# Patient Record
Sex: Female | Born: 1945 | Race: White | Hispanic: No | Marital: Married | Smoking: Never smoker
Health system: Southern US, Community
[De-identification: ages and names within clinical notes are randomized; demographics above are authoritative.]

## PROBLEM LIST (undated history)

## (undated) DIAGNOSIS — Z1211 Encounter for screening for malignant neoplasm of colon: Secondary | ICD-10-CM

## (undated) DIAGNOSIS — Z853 Personal history of malignant neoplasm of breast: Secondary | ICD-10-CM

## (undated) DIAGNOSIS — K639 Disease of intestine, unspecified: Secondary | ICD-10-CM

## (undated) DIAGNOSIS — I10 Essential (primary) hypertension: Secondary | ICD-10-CM

## (undated) DIAGNOSIS — F0390 Unspecified dementia without behavioral disturbance: Secondary | ICD-10-CM

## (undated) DIAGNOSIS — C801 Malignant (primary) neoplasm, unspecified: Secondary | ICD-10-CM

## (undated) DIAGNOSIS — I4891 Unspecified atrial fibrillation: Secondary | ICD-10-CM

## (undated) DIAGNOSIS — I509 Heart failure, unspecified: Secondary | ICD-10-CM

## (undated) DIAGNOSIS — N6009 Solitary cyst of unspecified breast: Secondary | ICD-10-CM

## (undated) HISTORY — DX: Disease of intestine, unspecified: K63.9

## (undated) HISTORY — DX: Malignant (primary) neoplasm, unspecified: C80.1

## (undated) HISTORY — DX: Personal history of malignant neoplasm of breast: Z85.3

## (undated) HISTORY — DX: Encounter for screening for malignant neoplasm of colon: Z12.11

## (undated) HISTORY — DX: Essential (primary) hypertension: I10

## (undated) HISTORY — DX: Solitary cyst of unspecified breast: N60.09

---

## 1988-11-04 HISTORY — PX: DILATION AND CURETTAGE OF UTERUS: SHX78

## 2000-03-06 DIAGNOSIS — Z853 Personal history of malignant neoplasm of breast: Secondary | ICD-10-CM

## 2000-03-06 HISTORY — DX: Personal history of malignant neoplasm of breast: Z85.3

## 2000-03-06 HISTORY — PX: CHOLECYSTECTOMY: SHX55

## 2000-03-06 HISTORY — PX: BREAST SURGERY: SHX581

## 2000-03-06 HISTORY — PX: BREAST BIOPSY: SHX20

## 2000-10-04 DIAGNOSIS — C801 Malignant (primary) neoplasm, unspecified: Secondary | ICD-10-CM

## 2000-10-04 HISTORY — DX: Malignant (primary) neoplasm, unspecified: C80.1

## 2004-03-06 DIAGNOSIS — I10 Essential (primary) hypertension: Secondary | ICD-10-CM

## 2004-03-06 HISTORY — DX: Essential (primary) hypertension: I10

## 2004-10-05 ENCOUNTER — Ambulatory Visit: Payer: Self-pay | Admitting: General Surgery

## 2005-10-06 ENCOUNTER — Ambulatory Visit: Payer: Self-pay | Admitting: General Surgery

## 2006-03-06 HISTORY — PX: COLONOSCOPY: SHX174

## 2006-10-10 ENCOUNTER — Ambulatory Visit: Payer: Self-pay | Admitting: General Surgery

## 2007-10-14 ENCOUNTER — Ambulatory Visit: Payer: Self-pay | Admitting: General Surgery

## 2008-10-14 ENCOUNTER — Ambulatory Visit: Payer: Self-pay | Admitting: General Surgery

## 2009-10-27 ENCOUNTER — Ambulatory Visit: Payer: Self-pay | Admitting: Family Medicine

## 2009-11-09 ENCOUNTER — Ambulatory Visit: Payer: Self-pay | Admitting: Family Medicine

## 2010-03-06 DIAGNOSIS — N6009 Solitary cyst of unspecified breast: Secondary | ICD-10-CM

## 2010-03-06 DIAGNOSIS — K639 Disease of intestine, unspecified: Secondary | ICD-10-CM

## 2010-03-06 HISTORY — DX: Solitary cyst of unspecified breast: N60.09

## 2010-03-06 HISTORY — DX: Disease of intestine, unspecified: K63.9

## 2010-11-08 ENCOUNTER — Ambulatory Visit: Payer: Self-pay | Admitting: General Surgery

## 2011-12-05 ENCOUNTER — Ambulatory Visit: Payer: Self-pay | Admitting: General Surgery

## 2012-08-05 ENCOUNTER — Encounter: Payer: Self-pay | Admitting: *Deleted

## 2012-08-05 DIAGNOSIS — Z853 Personal history of malignant neoplasm of breast: Secondary | ICD-10-CM | POA: Insufficient documentation

## 2013-01-08 ENCOUNTER — Ambulatory Visit: Payer: Self-pay | Admitting: General Surgery

## 2013-01-28 ENCOUNTER — Encounter: Payer: Self-pay | Admitting: *Deleted

## 2014-01-05 ENCOUNTER — Encounter: Payer: Self-pay | Admitting: *Deleted

## 2016-02-09 ENCOUNTER — Emergency Department: Payer: Medicare Other

## 2016-02-09 ENCOUNTER — Inpatient Hospital Stay
Admission: EM | Admit: 2016-02-09 | Discharge: 2016-02-11 | DRG: 309 | Disposition: A | Discharge status: Home / Self Care | Payer: Medicare Other | Attending: Internal Medicine | Admitting: Internal Medicine

## 2016-02-09 ENCOUNTER — Encounter: Payer: Self-pay | Admitting: Emergency Medicine

## 2016-02-09 ENCOUNTER — Inpatient Hospital Stay: Payer: Medicare Other

## 2016-02-09 DIAGNOSIS — I10 Essential (primary) hypertension: Secondary | ICD-10-CM | POA: Diagnosis present

## 2016-02-09 DIAGNOSIS — Z23 Encounter for immunization: Secondary | ICD-10-CM | POA: Diagnosis present

## 2016-02-09 DIAGNOSIS — E876 Hypokalemia: Secondary | ICD-10-CM | POA: Diagnosis present

## 2016-02-09 DIAGNOSIS — J9811 Atelectasis: Secondary | ICD-10-CM | POA: Diagnosis present

## 2016-02-09 DIAGNOSIS — I4891 Unspecified atrial fibrillation: Secondary | ICD-10-CM | POA: Diagnosis not present

## 2016-02-09 DIAGNOSIS — R0602 Shortness of breath: Secondary | ICD-10-CM | POA: Diagnosis present

## 2016-02-09 DIAGNOSIS — Z803 Family history of malignant neoplasm of breast: Secondary | ICD-10-CM

## 2016-02-09 DIAGNOSIS — J90 Pleural effusion, not elsewhere classified: Secondary | ICD-10-CM | POA: Diagnosis present

## 2016-02-09 DIAGNOSIS — Z853 Personal history of malignant neoplasm of breast: Secondary | ICD-10-CM

## 2016-02-09 LAB — COMPREHENSIVE METABOLIC PANEL
ALT: 27 U/L (ref 14–54)
AST: 29 U/L (ref 15–41)
Albumin: 4.7 g/dL (ref 3.5–5.0)
Alkaline Phosphatase: 83 U/L (ref 38–126)
Anion gap: 9 (ref 5–15)
BILIRUBIN TOTAL: 0.4 mg/dL (ref 0.3–1.2)
BUN: 11 mg/dL (ref 6–20)
CO2: 27 mmol/L (ref 22–32)
CREATININE: 0.7 mg/dL (ref 0.44–1.00)
Calcium: 9.6 mg/dL (ref 8.9–10.3)
Chloride: 104 mmol/L (ref 101–111)
GFR calc Af Amer: >60 mL/min (ref >60)
GLUCOSE: 121 mg/dL — AB (ref 65–99)
Potassium: 2.9 mmol/L — ABNORMAL LOW (ref 3.5–5.1)
Sodium: 140 mmol/L (ref 135–145)
TOTAL PROTEIN: 7.5 g/dL (ref 6.5–8.1)

## 2016-02-09 LAB — CBC WITH DIFFERENTIAL/PLATELET
BASOS ABS: 0.1 10*3/uL (ref 0–0.1)
Basophils Relative: 1 %
Eosinophils Absolute: 0.1 10*3/uL (ref 0–0.7)
Eosinophils Relative: 1 %
HEMATOCRIT: 42.9 % (ref 35.0–47.0)
HEMOGLOBIN: 14.4 g/dL (ref 12.0–16.0)
LYMPHS PCT: 30 %
Lymphs Abs: 2.4 10*3/uL (ref 1.0–3.6)
MCH: 30.2 pg (ref 26.0–34.0)
MCHC: 33.6 g/dL (ref 32.0–36.0)
MCV: 89.8 fL (ref 80.0–100.0)
Monocytes Absolute: 0.6 10*3/uL (ref 0.2–0.9)
Monocytes Relative: 8 %
NEUTROS ABS: 4.8 10*3/uL (ref 1.4–6.5)
NEUTROS PCT: 60 %
Platelets: 232 10*3/uL (ref 150–440)
RBC: 4.78 MIL/uL (ref 3.80–5.20)
RDW: 14.2 % (ref 11.5–14.5)
WBC: 7.9 10*3/uL (ref 3.6–11.0)

## 2016-02-09 LAB — TROPONIN I: Troponin I: <0.03 ng/mL (ref <0.03)

## 2016-02-09 LAB — TSH: TSH: 1.384 u[IU]/mL (ref 0.350–4.500)

## 2016-02-09 LAB — MAGNESIUM: Magnesium: 2.2 mg/dL (ref 1.7–2.4)

## 2016-02-09 LAB — BRAIN NATRIURETIC PEPTIDE: B Natriuretic Peptide: 162 pg/mL — ABNORMAL HIGH (ref 0.0–100.0)

## 2016-02-09 LAB — FIBRIN DERIVATIVES D-DIMER (ARMC ONLY): FIBRIN DERIVATIVES D-DIMER (ARMC): 651 — AB (ref 0–499)

## 2016-02-09 MED ORDER — IOPAMIDOL (ISOVUE-300) INJECTION 61%
75.0000 mL | Freq: Once | INTRAVENOUS | Status: AC | PRN
  Administered 2016-02-09: 75 mL via INTRAVENOUS

## 2016-02-09 MED ORDER — FUROSEMIDE 10 MG/ML IJ SOLN
20.0000 mg | Freq: Once | INTRAMUSCULAR | Status: AC
  Administered 2016-02-09: 20 mg via INTRAVENOUS
  Filled 2016-02-09: qty 4

## 2016-02-09 MED ORDER — DEXTROSE 5 % IV SOLN: 1.0000 g | Freq: Once | INTRAVENOUS | Status: DC

## 2016-02-09 MED ORDER — DILTIAZEM LOAD VIA INFUSION
10.0000 mg | Freq: Once | INTRAVENOUS | Status: DC
  Filled 2016-02-09: qty 10

## 2016-02-09 MED ORDER — DILTIAZEM HCL 25 MG/5ML IV SOLN
INTRAVENOUS | Status: AC
  Administered 2016-02-09: 10 mg
  Filled 2016-02-09: qty 5

## 2016-02-09 MED ORDER — POTASSIUM CHLORIDE CRYS ER 20 MEQ PO TBCR
20.0000 meq | EXTENDED_RELEASE_TABLET | Freq: Once | ORAL | Status: AC
  Administered 2016-02-09: 20 meq via ORAL
  Filled 2016-02-09: qty 1

## 2016-02-09 MED ORDER — DILTIAZEM HCL 100 MG IV SOLR
5.0000 mg/h | INTRAVENOUS | Status: DC
  Administered 2016-02-09: 5 mg/h via INTRAVENOUS
  Administered 2016-02-10: 10 mg/h via INTRAVENOUS
  Filled 2016-02-09 (×2): qty 100

## 2016-02-09 MED ORDER — SODIUM CHLORIDE 0.9 % IV BOLUS (SEPSIS)
500.0000 mL | Freq: Once | INTRAVENOUS | Status: AC
  Administered 2016-02-09: 500 mL via INTRAVENOUS

## 2016-02-09 MED ORDER — CEFTRIAXONE SODIUM-DEXTROSE 1-3.74 GM-% IV SOLR
1.0000 g | Freq: Once | INTRAVENOUS | Status: AC
  Administered 2016-02-09: 1 g via INTRAVENOUS
  Filled 2016-02-09: qty 50

## 2016-02-09 NOTE — ED Notes (Signed)
Pt returned from CT °

## 2016-02-09 NOTE — ED Notes (Signed)
ED Provider at bedside. 

## 2016-02-09 NOTE — ED Triage Notes (Signed)
Pt reports left side chest pain radiating to left arm for two days. Pt reports SOB. Denies nausea and vomiting and back pain. Pt states was seen at urgent care and told it was from coughing. Pt reports having a non productive cough this week.

## 2016-02-09 NOTE — ED Provider Notes (Signed)
Time Seen: Approximately 1907  I have reviewed the triage notes  Chief Complaint: Chest Pain and Shortness of Breath   History of Present Illness: Norma Hill is a 70 y.o. female *who presents with some left-sided chest discomfort and feelings of heart palpitations. She describes her chest discomfort is feeling like her heart is "thumping". She denies any arm or jaw pain and has no cardiovascular history. Patient was recently seen in urgent care and was told the chest discomfort and some nausea was from coughing. She states she did have some upper respiratory symptoms last week. She main concern was some trouble breathing last night. She denies any fever, persistent shortness of breath, leg pain or swelling.  Past Medical History:  Diagnosis Date  . Bowel trouble 2012   ocasionally  . Cancer ([place]) 10/2000   pt had 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 Pt then had a repeat excision on 11/16/2000  . Hypertension 2006  . Personal history of malignant neoplasm of breast 2002  . Solitary cyst of breast 2012  . Special screening for malignant neoplasms, colon     Patient Active Problem List   Diagnosis Date Noted  . Personal history of malignant neoplasm of breast   . Cancer ([place]) 10/04/2000    Past Surgical History:  Procedure Laterality Date  . BREAST BIOPSY Right 2002  . BREAST SURGERY Right 2002   wide excision and sn x4 done 10/29/00 with repeat excision done on 11/16/2000 with post operative radiation treatment and 5 years of tamoxifen with completion in 2007  . CHOLECYSTECTOMY  2002  . COLONOSCOPY  2008  . DILATION AND CURETTAGE OF UTERUS  1990,s    Past Surgical History:  Procedure Laterality Date  . BREAST BIOPSY Right 2002  . BREAST SURGERY Right 2002   wide excision and sn x4 done 10/29/00 with repeat excision done on 11/16/2000 with post operative radiation treatment and 5 years of tamoxifen with completion in 2007   . CHOLECYSTECTOMY  2002  . COLONOSCOPY  2008  . DILATION AND CURETTAGE OF UTERUS  1990,s      Allergies:  Patient has no known allergies.  Family History: Family History  Problem Relation Age of Onset  . Cancer Other     breast, relationship not listed    Social History: Social History  Substance Use Topics  . Smoking status: Never Smoker  . Smokeless tobacco: Not on file  . Alcohol use No     Review of Systems:   10 point review of systems was performed and was otherwise negative:  Constitutional: No fever Eyes: No visual disturbances ENT: No sore throat, ear pain Cardiac: She denies any persistent chest pain. Respiratory: No shortness of breath, wheezing, or stridor Abdomen: No abdominal pain, no vomiting, No diarrhea Endocrine: No weight loss, No night sweats Extremities: No peripheral edema, cyanosis Skin: No rashes, easy bruising Neurologic: No focal weakness, trouble with speech or swollowing Urologic: No dysuria, Hematuria, or urinary frequency   Physical Exam:  ED Triage Vitals  Enc Vitals Group     BP 02/09/16 1859 (!) 169/119     Pulse Rate 02/09/16 1859 89     Resp 02/09/16 1859 18     Temp --      Temp src --      SpO2 02/09/16 1859 95 %     Weight 02/09/16 1856 170 lb (77.1 kg)     Height 02/09/16 1856 5' 3.5" (  1.613 m)     Head Circumference --      Peak Flow --      Pain Score 02/09/16 1856 6     Pain Loc --      Pain Edu? --      Excl. in [place]? --     General: Awake , Alert , and Oriented times 3; GCS 15 Head: Normal cephalic , atraumatic Eyes: Pupils equal , round, reactive to light Nose/Throat: No nasal drainage, patent upper airway without erythema or exudate.  Neck: Supple, Full range of motion, No anterior adenopathy or palpable thyroid masses Lungs: Clear to ascultation without wheezes , rhonchi, or rales Heart: Irregular rate, irregular rhythm without murmurs gallops or rubs  Abdomen: Soft, non tender without rebound,  guarding , or rigidity; bowel sounds positive and symmetric in all 4 quadrants. No organomegaly .        Extremities: 2 plus symmetric pulses. No edema, clubbing or cyanosis Neurologic: normal ambulation, Motor symmetric without deficits, sensory intact Skin: warm, dry, no rashes   Labs:   All laboratory work was reviewed including any pertinent negatives or positives listed below:  Labs Reviewed  CBC WITH DIFFERENTIAL/PLATELET  COMPREHENSIVE METABOLIC PANEL  TSH  TROPONIN I  BRAIN NATRIURETIC PEPTIDE  MAGNESIUM    EKG: * ED ECG REPORT I, Daymon Larsen, the attending physician, personally viewed and interpreted this ECG.  Date: 02/09/2016 EKG Time: 1857 Rate: 142Rhythm: Atrial fibrillation with rapid ventricular response  QRS Axis: normal Intervals: normal ST/T Wave abnormalities: normal Conduction Disturbances: none Narrative Interpretation: unremarkable No acute ischemic changes   Radiology:  "Dg Chest Port 1 View  Result Date: 02/09/2016 CLINICAL DATA:  Short of breath EXAM: PORTABLE CHEST 1 VIEW COMPARISON:  None. FINDINGS: Moderately large right pleural effusion with right lower lobe atelectasis or infiltrate. Mild left lower lobe airspace disease Cardiac enlargement without heart failure or edema. IMPRESSION: Moderate right pleural effusion with right lower lobe airspace disease. This could represent pneumonia or tumor. CT chest with contrast suggested. Mild left lower lobe atelectasis/infiltrate. Electronically Signed   By: Franchot Gallo M.D.   On: 02/09/2016 19:46  "  I personally reviewed the radiologic studies  Critical Care: CRITICAL CARE Performed by: Daymon Larsen   Total critical care time: 35 minutes  Critical care time was exclusive of separately billable procedures and treating other patients.  Critical care was necessary to treat or prevent imminent or life-threatening deterioration.  Critical care was time spent personally by me on the  following activities: development of treatment plan with patient and/or surrogate as well as nursing, discussions with consultants, evaluation of patient's response to treatment, examination of patient, obtaining history from patient or surrogate, ordering and performing treatments and interventions, ordering and review of laboratory studies, ordering and review of radiographic studies, pulse oximetry and re-evaluation of patient's condition. Initiation of anti-rhythmic IV medication   ED Course: Patient's stay here was uneventful and the patient's otherwise hemodynamically stable. She was mildly hypoxic and was placed on some supplemental oxygen and started on IV Cardizem bolus and a Cardizem drip to convert for new onset atrial fibrillation with a rapid ventricular rate. Her blood pressure seemed to tolerate the antiarrhythmic medication well. The patient has some findings consistent with possible pneumonia versus pulmonary edema. She also has an unfortunate history of breast cancer and her pleural effusion may need to be evaluated closely to see if there is improvement with diuretics etc. She was given IV Rocephin after  blood cultures 2 were obtained. Clinical Course      Assessment:  New-onset atrial fibrillation with rapid ventricular rate Pulmonary edema versus community-acquired pneumonia versus pleural effusion     Plan: * Inpatient            Daymon Larsen, MD 02/09/16 2251

## 2016-02-10 ENCOUNTER — Inpatient Hospital Stay
Admit: 2016-02-10 | Discharge: 2016-02-10 | Disposition: A | Discharge status: Home / Self Care | Payer: Medicare Other | Attending: Family Medicine | Admitting: Family Medicine

## 2016-02-10 LAB — BASIC METABOLIC PANEL
ANION GAP: 9 (ref 5–15)
BUN: 9 mg/dL (ref 6–20)
CHLORIDE: 105 mmol/L (ref 101–111)
CO2: 26 mmol/L (ref 22–32)
Calcium: 8.8 mg/dL — ABNORMAL LOW (ref 8.9–10.3)
Creatinine, Ser: 0.59 mg/dL (ref 0.44–1.00)
GFR calc non Af Amer: >60 mL/min (ref >60)
GLUCOSE: 114 mg/dL — AB (ref 65–99)
Potassium: 2.8 mmol/L — ABNORMAL LOW (ref 3.5–5.1)
Sodium: 140 mmol/L (ref 135–145)

## 2016-02-10 LAB — CBC
HCT: 39.2 % (ref 35.0–47.0)
HEMOGLOBIN: 13.3 g/dL (ref 12.0–16.0)
MCH: 30.1 pg (ref 26.0–34.0)
MCHC: 33.9 g/dL (ref 32.0–36.0)
MCV: 88.8 fL (ref 80.0–100.0)
Platelets: 200 10*3/uL (ref 150–440)
RBC: 4.42 MIL/uL (ref 3.80–5.20)
RDW: 14.6 % — ABNORMAL HIGH (ref 11.5–14.5)
WBC: 8.2 10*3/uL (ref 3.6–11.0)

## 2016-02-10 LAB — LIPID PANEL
CHOL/HDL RATIO: 2.9 ratio
CHOLESTEROL: 169 mg/dL (ref 0–200)
HDL: 59 mg/dL (ref >40)
LDL Cholesterol: 100 mg/dL — ABNORMAL HIGH (ref 0–99)
TRIGLYCERIDES: 50 mg/dL (ref <150)
VLDL: 10 mg/dL (ref 0–40)

## 2016-02-10 LAB — TROPONIN I
Troponin I: <0.03 ng/mL (ref <0.03)
Troponin I: <0.03 ng/mL (ref <0.03)
Troponin I: <0.03 ng/mL (ref <0.03)

## 2016-02-10 LAB — TSH: TSH: 0.68 u[IU]/mL (ref 0.350–4.500)

## 2016-02-10 LAB — MAGNESIUM: Magnesium: 2 mg/dL (ref 1.7–2.4)

## 2016-02-10 LAB — PHOSPHORUS: Phosphorus: 3.1 mg/dL (ref 2.5–4.6)

## 2016-02-10 MED ORDER — ENOXAPARIN SODIUM 40 MG/0.4ML ~~LOC~~ SOLN
40.0000 mg | Freq: Every day | SUBCUTANEOUS | Status: DC
  Filled 2016-02-10: qty 0.4

## 2016-02-10 MED ORDER — ACETAMINOPHEN 325 MG PO TABS: 650.0000 mg | ORAL_TABLET | Freq: Four times a day (QID) | ORAL | Status: DC | PRN

## 2016-02-10 MED ORDER — SODIUM CHLORIDE 0.9% FLUSH
3.0000 mL | Freq: Two times a day (BID) | INTRAVENOUS | Status: DC
  Administered 2016-02-10 (×3): 3 mL via INTRAVENOUS

## 2016-02-10 MED ORDER — BISACODYL 5 MG PO TBEC: 5.0000 mg | DELAYED_RELEASE_TABLET | Freq: Every day | ORAL | Status: DC | PRN

## 2016-02-10 MED ORDER — MAGNESIUM CITRATE PO SOLN: 1.0000 | Freq: Once | ORAL | Status: DC | PRN

## 2016-02-10 MED ORDER — DILTIAZEM HCL 60 MG PO TABS
60.0000 mg | ORAL_TABLET | Freq: Four times a day (QID) | ORAL | Status: DC
  Administered 2016-02-10 – 2016-02-11 (×4): 60 mg via ORAL
  Filled 2016-02-10 (×4): qty 1

## 2016-02-10 MED ORDER — PNEUMOCOCCAL VAC POLYVALENT 25 MCG/0.5ML IJ INJ
0.5000 mL | INJECTION | INTRAMUSCULAR | Status: AC
Start: 2016-02-11 — End: 2016-02-11
  Administered 2016-02-11: 0.5 mL via INTRAMUSCULAR
  Filled 2016-02-10: qty 0.5

## 2016-02-10 MED ORDER — POTASSIUM CHLORIDE CRYS ER 20 MEQ PO TBCR
40.0000 meq | EXTENDED_RELEASE_TABLET | Freq: Two times a day (BID) | ORAL | Status: DC
  Administered 2016-02-10 – 2016-02-11 (×3): 40 meq via ORAL
  Filled 2016-02-10 (×3): qty 2

## 2016-02-10 MED ORDER — APIXABAN 5 MG PO TABS
5.0000 mg | ORAL_TABLET | Freq: Two times a day (BID) | ORAL | Status: DC
  Administered 2016-02-10 – 2016-02-11 (×2): 5 mg via ORAL
  Filled 2016-02-10 (×2): qty 1

## 2016-02-10 MED ORDER — HYDROCODONE-ACETAMINOPHEN 5-325 MG PO TABS: 1.0000 | ORAL_TABLET | ORAL | Status: DC | PRN

## 2016-02-10 MED ORDER — ONDANSETRON HCL 4 MG PO TABS: 4.0000 mg | ORAL_TABLET | Freq: Four times a day (QID) | ORAL | Status: DC | PRN

## 2016-02-10 MED ORDER — ASPIRIN EC 81 MG PO TBEC
81.0000 mg | DELAYED_RELEASE_TABLET | Freq: Every day | ORAL | Status: DC
  Administered 2016-02-10: 81 mg via ORAL
  Filled 2016-02-10 (×3): qty 1

## 2016-02-10 MED ORDER — SENNOSIDES-DOCUSATE SODIUM 8.6-50 MG PO TABS: 1.0000 | ORAL_TABLET | Freq: Every evening | ORAL | Status: DC | PRN

## 2016-02-10 MED ORDER — SODIUM CHLORIDE 0.9 % IV SOLN
INTRAVENOUS | Status: DC
  Administered 2016-02-10: 01:00:00 via INTRAVENOUS

## 2016-02-10 MED ORDER — ACETAMINOPHEN 650 MG RE SUPP: 650.0000 mg | Freq: Four times a day (QID) | RECTAL | Status: DC | PRN

## 2016-02-10 MED ORDER — HYDRALAZINE HCL 20 MG/ML IJ SOLN
5.0000 mg | Freq: Once | INTRAMUSCULAR | Status: AC
  Administered 2016-02-10: 5 mg via INTRAVENOUS
  Filled 2016-02-10: qty 1

## 2016-02-10 MED ORDER — ADULT MULTIVITAMIN W/MINERALS CH
1.0000 | ORAL_TABLET | Freq: Every day | ORAL | Status: DC
  Administered 2016-02-10 – 2016-02-11 (×2): 1 via ORAL
  Filled 2016-02-10 (×3): qty 1

## 2016-02-10 MED ORDER — ZOLPIDEM TARTRATE 5 MG PO TABS
5.0000 mg | ORAL_TABLET | Freq: Every evening | ORAL | Status: DC | PRN
  Administered 2016-02-10: 5 mg via ORAL
  Filled 2016-02-10: qty 1

## 2016-02-10 MED ORDER — ENOXAPARIN SODIUM 80 MG/0.8ML ~~LOC~~ SOLN
1.0000 mg/kg | Freq: Two times a day (BID) | SUBCUTANEOUS | Status: DC
  Administered 2016-02-10 (×2): 75 mg via SUBCUTANEOUS
  Filled 2016-02-10 (×2): qty 0.8

## 2016-02-10 MED ORDER — ONDANSETRON HCL 4 MG/2ML IJ SOLN: 4.0000 mg | Freq: Four times a day (QID) | INTRAMUSCULAR | Status: DC | PRN

## 2016-02-10 NOTE — Progress Notes (Signed)
[facility] NAME: Norma Hill    MRN#:  [id number]  DATE OF BIRTH:  01-19-1946  SUBJECTIVE:  Hospital Day: 1 day Norma Hill is a 70 y.o. female presenting with Chest Pain and Shortness of Breath .   Overnight events: Taken off of Cardizem drip Interval Events: No complaints  REVIEW OF SYSTEMS:  CONSTITUTIONAL: No fever, fatigue or weakness.  EYES: No blurred or double vision.  EARS, NOSE, AND THROAT: No tinnitus or ear pain.  RESPIRATORY: No cough, shortness of breath, wheezing or hemoptysis.  CARDIOVASCULAR: No chest pain, orthopnea, edema.  GASTROINTESTINAL: No nausea, vomiting, diarrhea or abdominal pain.  GENITOURINARY: No dysuria, hematuria.  ENDOCRINE: No polyuria, nocturia,  HEMATOLOGY: No anemia, easy bruising or bleeding SKIN: No rash or lesion. MUSCULOSKELETAL: No joint pain or arthritis.   NEUROLOGIC: No tingling, numbness, weakness.  PSYCHIATRY: No anxiety or depression.   DRUG ALLERGIES:  No Known Allergies  VITALS:  Blood pressure (!) 150/82, pulse 84, temperature 98.1 F (36.7 C), temperature source Oral, resp. rate 18, height 5\' 3"  (1.6 m), weight 77 kg (169 lb 11.2 oz), SpO2 91 %.  PHYSICAL EXAMINATION:  VITAL SIGNS: Vitals:   02/10/16 0700 02/10/16 1152  BP: (!) 145/86 (!) 150/82  Pulse: 94 84  Resp: 18 18  Temp:  98.1 F (36.7 C)   GENERAL:70 y.o.female currently in no acute distress.  HEAD: Normocephalic, atraumatic.  EYES: Pupils equal, round, reactive to light. Extraocular muscles intact. No scleral icterus.  MOUTH: Moist mucosal membrane. Dentition intact. No abscess noted.  EAR, NOSE, THROAT: Clear without exudates. No external lesions.  NECK: Supple. No thyromegaly. No nodules. No JVD.  PULMONARY: Clear to ascultation, without wheeze rails or rhonci. No use of accessory muscles, Good respiratory effort. good air entry bilaterally CHEST: Nontender to palpation.  CARDIOVASCULAR:  S1 and S2. Irregular rate and rhythm. No murmurs, rubs, or gallops. No edema. Pedal pulses 2+ bilaterally.  GASTROINTESTINAL: Soft, nontender, nondistended. No masses. Positive bowel sounds. No hepatosplenomegaly.  MUSCULOSKELETAL: No swelling, clubbing, or edema. Range of motion full in all extremities.  NEUROLOGIC: Cranial nerves II through XII are intact. No gross focal neurological deficits. Sensation intact. Reflexes intact.  SKIN: No ulceration, lesions, rashes, or cyanosis. Skin warm and dry. Turgor intact.  PSYCHIATRIC: Mood, affect within normal limits. The patient is awake, alert and oriented x 3. Insight, judgment intact.      LABORATORY PANEL:   CBC  Recent Labs Lab 02/10/16 0723  WBC 8.2  HGB 13.3  HCT 39.2  PLT 200   ------------------------------------------------------------------------------------------------------------------  Chemistries   Recent Labs Lab 02/09/16 1908 02/10/16 0138 02/10/16 0723  NA 140  --  140  K 2.9*  --  2.8*  CL 104  --  105  CO2 27  --  26  GLUCOSE 121*  --  114*  BUN 11  --  9  CREATININE 0.70  --  0.59  CALCIUM 9.6  --  8.8*  MG 2.2 2.0  --   AST 29  --   --   ALT 27  --   --   ALKPHOS 83  --   --   BILITOT 0.4  --   --    ------------------------------------------------------------------------------------------------------------------  Cardiac Enzymes  Recent Labs Lab 02/10/16 0723  TROPONINI <0.03   ------------------------------------------------------------------------------------------------------------------  RADIOLOGY:  Ct Chest W Contrast  Result Date: 02/09/2016 CLINICAL DATA:  Left-sided chest pain radiating to the left arm for  2 days. Reports dyspnea. Nonproductive cough this week. EXAM: CT CHEST WITH CONTRAST TECHNIQUE: Multidetector CT imaging of the chest was performed during intravenous contrast administration. CONTRAST:  56mL ISOVUE-300 IOPAMIDOL (ISOVUE-300) INJECTION 61% COMPARISON:  CXR from  earlier on the same day. FINDINGS: Cardiovascular: No large central pulmonary embolus. Cardiomegaly small pericardial effusion. Coronary arteriosclerosis is seen. No thoracic aortic aneurysm or dissection. Aortic atherosclerosis. Mediastinum/Nodes: Heterogeneous substernal goiter slightly deviating the trachea to the right. 9 mm or less short axis mediastinal lymph nodes are noted possibly reactive in etiology. The trachea and mainstem bronchi appear patent. The thoracic esophagus is unremarkable. Lungs/Pleura: Partially loculated moderate right effusion with compressive atelectasis and bilateral lower and right middle lobe bronchiectasis. Patchy atelectasis at left lung base. No dominant mass. Mosaic ground-glass opacity in the upper lobes possibly representing sequela of CHF. Upper Abdomen: Mild nodularity of the liver surface consistent cirrhosis. No space-occupying mass of the liver, both adrenal glands, visualized spleen, pancreas and upper poles of both kidneys. There is mild ectasia of the upper pole renal collecting system bilaterally. Musculoskeletal: No acute osseous abnormality. Mid to lower thoracic and upper lumbar degenerative disc disease. IMPRESSION: Cardiomegaly with coronary arteriosclerosis. Small pericardial effusion. Aortic atherosclerosis. Substernal goiter. Partially loculated moderate right pleural effusion with compressive atelectasis of bilateral lower lobe and right middle lobe bronchiectasis. Surface undulations of the liver may represent cirrhosis. Electronically Signed   By: Ashley Royalty M.D.   On: 02/09/2016 23:16   Dg Chest Port 1 View  Result Date: 02/09/2016 CLINICAL DATA:  Short of breath EXAM: PORTABLE CHEST 1 VIEW COMPARISON:  None. FINDINGS: Moderately large right pleural effusion with right lower lobe atelectasis or infiltrate. Mild left lower lobe airspace disease Cardiac enlargement without heart failure or edema. IMPRESSION: Moderate right pleural effusion with right  lower lobe airspace disease. This could represent pneumonia or tumor. CT chest with contrast suggested. Mild left lower lobe atelectasis/infiltrate. Electronically Signed   By: Franchot Gallo M.D.   On: 02/09/2016 19:46    EKG:   Orders placed or performed during the hospital encounter of 02/09/16  . EKG 12-Lead  . EKG 12-Lead  . EKG    ASSESSMENT AND PLAN:   Norma Hill is a 70 y.o. female presenting with Chest Pain and Shortness of Breath . Admitted 02/09/2016 : Day #: 1 day 1. New onset atrial fibrillation: Appreciate cardiology input, Cardizem and Eliquis Anticipate discharge tomorrow   All the records are reviewed and case discussed with Care Management/Social Workerr. Management plans discussed with the patient, family and they are in agreement.  CODE STATUS: full TOTAL TIME TAKING CARE OF THIS PATIENT: 28 minutes.   POSSIBLE D/C IN 1DAYS, DEPENDING ON CLINICAL CONDITION.   Hower,  Karenann Cai.D on 02/10/2016 at 1:59 PM  Between 7am to 6pm - Pager - [phone]  After 6pm: House Pager: - [phone]  [facility]  Office  [phone]  CC: Primary care physician; No primary care provider on file.

## 2016-02-10 NOTE — Care Management (Signed)
Obtained prior authorization for Eliquis.  Reference Number: [id number]- through 03/05/2017. Copay: 38.70 dollars for 60 tabs

## 2016-02-10 NOTE — Progress Notes (Signed)
Patient said that she was admitted yesterday with breathing difficulties. She said that she was making some progress, but she needed prayers. Her husband was in the room when Chaplain visited. Patient and her husband said that they were members of [employer]. Chaplain prayed with the patient and her husband.

## 2016-02-10 NOTE — H&P (Signed)
[place] @ [facility] Admission History and Physical [employer], D.O.  ---------------------------------------------------------------------------------------------------------------------   PATIENT NAME: Norma Hill MR#: [id number] DATE OF BIRTH: 08/17/1945 DATE OF ADMISSION: 02/09/2016 PRIMARY CARE PHYSICIAN: No primary care provider on file.  REQUESTING/REFERRING PHYSICIAN: ED Dr. Marcelene Butte  CHIEF COMPLAINT: Chief Complaint  Patient presents with  . Chest Pain  . Shortness of Breath    HISTORY OF PRESENT ILLNESS: Norma Hill is a 70 y.o. female with a known history of Hypertension and breast cancer presents to the emergency department complaining of shortness of breath.  Patient was in a usual state of health until earlier this week when she describes progressively worsening shortness of breath, worse with exertion. Today she reports her shortness of breath became severe and was accompanied by left-sided chest pressure and palpitations.. She also reports a nonproductive cough for the past week and some nasal congestion.  Patient has not been seen by a cardiologist nor has she had a cardiac workup.  Otherwise there has been no change in status. Patient has been taking medication as prescribed and there has been no recent change in medication or diet.  There has been no recent illness, travel or sick contacts.    Patient denies fevers/chills, weakness, dizziness, N/V/C/D, abdominal pain, dysuria/frequency, changes in mental status.   EMS/ED COURSE:   Patient received Cardizem drip, Rocephin, Lasix, IV fluids, hydralazine and potassium.Marland Kitchen  PAST MEDICAL HISTORY: Past Medical History:  Diagnosis Date  . Bowel trouble 2012   ocasionally  . Cancer ([place]) 10/2000   pt had 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 Pt then had a repeat excision on 11/16/2000  . Hypertension 2006  . Personal history of malignant neoplasm of  breast 2002  . Solitary cyst of breast 2012  . Special screening for malignant neoplasms, colon       PAST SURGICAL HISTORY: Past Surgical History:  Procedure Laterality Date  . BREAST BIOPSY Right 2002  . BREAST SURGERY Right 2002   wide excision and sn x4 done 10/29/00 with repeat excision done on 11/16/2000 with post operative radiation treatment and 5 years of tamoxifen with completion in 2007  . CHOLECYSTECTOMY  2002  . COLONOSCOPY  2008  . DILATION AND CURETTAGE OF UTERUS  1990,s      SOCIAL HISTORY: Social History  Substance Use Topics  . Smoking status: Never Smoker  . Smokeless tobacco: Not on file  . Alcohol use No  Patient denies alcohol, tobacco or drug use.    FAMILY HISTORY: Family History  Problem Relation Age of Onset  . Cancer Other     breast, relationship not listed  Patient denies any family history of heart disease, sudden cardiac death or stroke.   MEDICATIONS AT HOME: Prior to Admission medications   Medication Sig Start Date End Date Taking? Authorizing Provider  aspirin EC 81 MG tablet Take 81 mg by mouth daily.   Yes Historical Provider, MD  Multiple Vitamin (MULTIVITAMIN WITH MINERALS) TABS tablet Take 1 tablet by mouth daily.   Yes Historical Provider, MD      DRUG ALLERGIES: No Known Allergies   REVIEW OF SYSTEMS: CONSTITUTIONAL: No fatigue, weakness, fever, chills, weight gain/loss, headache EYES: No blurry or double vision. ENT: No tinnitus, postnasal drip, redness or soreness of the oropharynx. RESPIRATORY: Positive dyspnea, cough, negative wheeze, hemoptysis. CARDIOVASCULAR: Positive chest pain, orthopnea, palpitations, negative syncope. GASTROINTESTINAL: No nausea, vomiting, constipation, diarrhea, abdominal pain. No hematemesis, melena or hematochezia. GENITOURINARY:  No dysuria, frequency, hematuria. ENDOCRINE: No polyuria or nocturia. No heat or cold intolerance. HEMATOLOGY: No anemia, bruising, bleeding. INTEGUMENTARY: No  rashes, ulcers, lesions. MUSCULOSKELETAL: No pain, arthritis, swelling, gout. NEUROLOGIC: No numbness, tingling, weakness or ataxia. No seizure-type activity. PSYCHIATRIC: No anxiety, depression, insomnia.  PHYSICAL EXAMINATION: VITAL SIGNS: Blood pressure (!) 166/95, pulse 90, temperature 97.8 F (36.6 C), temperature source Oral, resp. rate 20, height 5' 3.5" (1.613 m), weight 77.1 kg (170 lb), SpO2 92 %.  GENERAL: 70 y.o.-year-old white female patient, well-developed, well-nourished lying in the bed in no acute distress.  Pleasant and cooperative.   HEENT: Head atraumatic, normocephalic. Pupils equal, round, reactive to light and accommodation. No scleral icterus. Extraocular muscles intact. Oropharynx is clear. Mucus membranes moist. NECK: Supple, full range of motion. No JVD, no bruit heard. No cervical lymphadenopathy. CHEST: Normal breath sounds bilaterally. No wheezing, rales, rhonchi or crackles. No use of accessory muscles of respiration.  No reproducible chest wall tenderness.  CARDIOVASCULAR: Irregularly irregular, S1, S2 normal. No murmurs, rubs, or gallops appreciated. Cap refill <2 seconds. ABDOMEN: Soft, nontender, nondistended. No rebound, guarding, rigidity. Normoactive bowel sounds present in all four quadrants. No organomegaly or mass. EXTREMITIES:  No pedal edema, cyanosis, or clubbing. NEUROLOGIC: Cranial nerves II through XII are grossly intact with no focal sensorimotor deficit. Muscle strength 5/5 in all extremities. Sensation intact. Gait not checked. PSYCHIATRIC: The patient is alert and oriented x 3. Normal affect, mood, thought content. SKIN: Warm, dry, and intact without obvious rash, lesion, or ulcer.  LABORATORY PANEL:  CBC  Recent Labs Lab 02/09/16 1908  WBC 7.9  HGB 14.4  HCT 42.9  PLT 232   ----------------------------------------------------------------------------------------------------------------- Chemistries  Recent Labs Lab 02/09/16 1908   NA 140  K 2.9*  CL 104  CO2 27  GLUCOSE 121*  BUN 11  CREATININE 0.70  CALCIUM 9.6  MG 2.2  AST 29  ALT 27  ALKPHOS 83  BILITOT 0.4   ------------------------------------------------------------------------------------------------------------------ Cardiac Enzymes  Recent Labs Lab 02/09/16 1908  TROPONINI <0.03   ------------------------------------------------------------------------------------------------------------------  RADIOLOGY: Ct Chest W Contrast  Result Date: 02/09/2016 CLINICAL DATA:  Left-sided chest pain radiating to the left arm for 2 days. Reports dyspnea. Nonproductive cough this week. EXAM: CT CHEST WITH CONTRAST TECHNIQUE: Multidetector CT imaging of the chest was performed during intravenous contrast administration. CONTRAST:  3mL ISOVUE-300 IOPAMIDOL (ISOVUE-300) INJECTION 61% COMPARISON:  CXR from earlier on the same day. FINDINGS: Cardiovascular: No large central pulmonary embolus. Cardiomegaly small pericardial effusion. Coronary arteriosclerosis is seen. No thoracic aortic aneurysm or dissection. Aortic atherosclerosis. Mediastinum/Nodes: Heterogeneous substernal goiter slightly deviating the trachea to the right. 9 mm or less short axis mediastinal lymph nodes are noted possibly reactive in etiology. The trachea and mainstem bronchi appear patent. The thoracic esophagus is unremarkable. Lungs/Pleura: Partially loculated moderate right effusion with compressive atelectasis and bilateral lower and right middle lobe bronchiectasis. Patchy atelectasis at left lung base. No dominant mass. Mosaic ground-glass opacity in the upper lobes possibly representing sequela of CHF. Upper Abdomen: Mild nodularity of the liver surface consistent cirrhosis. No space-occupying mass of the liver, both adrenal glands, visualized spleen, pancreas and upper poles of both kidneys. There is mild ectasia of the upper pole renal collecting system bilaterally. Musculoskeletal: No acute  osseous abnormality. Mid to lower thoracic and upper lumbar degenerative disc disease. IMPRESSION: Cardiomegaly with coronary arteriosclerosis. Small pericardial effusion. Aortic atherosclerosis. Substernal goiter. Partially loculated moderate right pleural effusion with compressive atelectasis of bilateral lower lobe and right middle lobe  bronchiectasis. Surface undulations of the liver may represent cirrhosis. Electronically Signed   By: Ashley Royalty M.D.   On: 02/09/2016 23:16   Dg Chest Port 1 View  Result Date: 02/09/2016 CLINICAL DATA:  Short of breath EXAM: PORTABLE CHEST 1 VIEW COMPARISON:  None. FINDINGS: Moderately large right pleural effusion with right lower lobe atelectasis or infiltrate. Mild left lower lobe airspace disease Cardiac enlargement without heart failure or edema. IMPRESSION: Moderate right pleural effusion with right lower lobe airspace disease. This could represent pneumonia or tumor. CT chest with contrast suggested. Mild left lower lobe atelectasis/infiltrate. Electronically Signed   By: Franchot Gallo M.D.   On: 02/09/2016 19:46    EKG: Atrial fibrillation at 142 bpm with normal axis and nonspecific ST-T wave changes.   IMPRESSION AND PLAN:  This is a 70 y.o. female with a history of hypertension, breast cancer now being admitted with:  1. New onset atrial fibrillation with rapid ventricular response -Admit to telemetry -IV Cardizem drip -Trend troponins, check TSH and lipids -Check echo in a.m. -Lovenox 1 (ChadsVasc score is 3, unclear how long she has been in afib). -Continue aspirin -Cardiology consultation  2. Partially loculated right pleural effusion with compressive atelectasis -Patient received Lasix and Rocephin in the emergency department. Clinically the patient does not exhibit signs or symptoms consistent with pneumonia (no fever, leukocytosis) -Follow-up echo in a.m. -Consider pulmonary consultation if not improving -Incentive spirometry  3.  Hypokalemia -Replace by mouth  4. History of hypertension -Hydralazine given in the emergency department along with IV Cardizem drip   Diet/Nutrition: Heart healthy, nothing by mouth after midnight Fluids: IV normal saline DVT Px: Lovenox, SCDs and early ambulation Code Status: Full  All the records are reviewed and case discussed with ED provider. Management plans discussed with the patient and/or family who express understanding and agree with plan of care.   TOTAL TIME TAKING CARE OF THIS PATIENT: 60 minutes.   Emmalea Treanor D.O. on 02/10/2016 at 12:45 AM Between 7am to 6pm - Pager - [phone] After 6pm go to [email or web address] - [employer] [facility] Office [phone] CC: Primary care physician; No primary care provider on file.     Note: This dictation was prepared with Dragon dictation along with smaller phrase technology. Any transcriptional errors that result from this process are unintentional.

## 2016-02-10 NOTE — Consult Note (Signed)
[facility] Cardiology  CARDIOLOGY CONSULT NOTE  Patient ID: Norma Hill MRN: [id number] DOB/AGE: 72-Jul-1947 72 y.o.  Admit date: 02/09/2016 Referring Physician [place] Primary Physician [place] Primary Cardiologist  Reason for Consultation Atrial fibrillation  HPI: 70 year old female referred for evaluation of atrial fibrillation. Patient presents with a one-week history of increasing shortness of breath and palpitations. Yesterday, the patient presented to [facility] emergency room where she was noted to be in atrial fibrillation with a rapid ventricular rate, started on Cardizem drip. Today, patient reports feeling better with improved heart rate. Admission labs were notable for negative troponin. Chest x-ray revealed moderate right pleural effusion. CT scan ruled out tumor. Patient has a history of essential hypertension, with chads Vascor of 3. She's had no prior history of atrial fibrillation.  Review of systems complete and found to be negative unless listed above     Past Medical History:  Diagnosis Date  . Bowel trouble 2012   ocasionally  . Cancer ([place]) 10/2000   pt had 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 Pt then had a repeat excision on 11/16/2000  . Hypertension 2006  . Personal history of malignant neoplasm of breast 2002  . Solitary cyst of breast 2012  . Special screening for malignant neoplasms, colon     Past Surgical History:  Procedure Laterality Date  . BREAST BIOPSY Right 2002  . BREAST SURGERY Right 2002   wide excision and sn x4 done 10/29/00 with repeat excision done on 11/16/2000 with post operative radiation treatment and 5 years of tamoxifen with completion in 2007  . CHOLECYSTECTOMY  2002  . COLONOSCOPY  2008  . DILATION AND CURETTAGE OF UTERUS  1990,s    Prescriptions Prior to Admission  Medication Sig Dispense Refill Last Dose  . aspirin EC 81 MG tablet Take 81 mg by mouth daily.   02/08/2016 at Unknown time   . Multiple Vitamin (MULTIVITAMIN WITH MINERALS) TABS tablet Take 1 tablet by mouth daily.   02/09/2016 at Unknown time   Social History   Social History  . Marital status: Married    Spouse name: N/A  . Number of children: N/A  . Years of education: N/A   Occupational History  . Not on file.   Social History Main Topics  . Smoking status: Never Smoker  . Smokeless tobacco: Never Used  . Alcohol use No  . Drug use: No  . Sexual activity: Not on file   Other Topics Concern  . Not on file   Social History Narrative  . No narrative on file    Family History  Problem Relation Age of Onset  . Cancer Other     breast, relationship not listed      Review of systems complete and found to be negative unless listed above      PHYSICAL EXAM  General: Well developed, well nourished, in no acute distress HEENT:  Normocephalic and atramatic Neck:  No JVD.  Lungs: Clear bilaterally to auscultation and percussion. Heart: HRRR . Normal S1 and S2 without gallops or murmurs.  Abdomen: Bowel sounds are positive, abdomen soft and non-tender  Msk:  Back normal, normal gait. Normal strength and tone for age. Extremities: No clubbing, cyanosis or edema.   Neuro: Alert and oriented X 3. Psych:  Good affect, responds appropriately  Labs:   Lab Results  Component Value Date   WBC 8.2 02/10/2016   HGB 13.3 02/10/2016   HCT 39.2 02/10/2016   MCV  88.8 02/10/2016   PLT 200 02/10/2016    Recent Labs Lab 02/09/16 1908 02/10/16 0723  NA 140 140  K 2.9* 2.8*  CL 104 105  CO2 27 26  BUN 11 9  CREATININE 0.70 0.59  CALCIUM 9.6 8.8*  PROT 7.5  --   BILITOT 0.4  --   ALKPHOS 83  --   ALT 27  --   AST 29  --   GLUCOSE 121* 114*   Lab Results  Component Value Date   TROPONINI <0.03 02/10/2016    Lab Results  Component Value Date   CHOL 169 02/10/2016   Lab Results  Component Value Date   HDL 59 02/10/2016   Lab Results  Component Value Date   LDLCALC 100 (H)  02/10/2016   Lab Results  Component Value Date   TRIG 50 02/10/2016   Lab Results  Component Value Date   CHOLHDL 2.9 02/10/2016   No results found for: LDLDIRECT    Radiology: Ct Chest W Contrast  Result Date: 02/09/2016 CLINICAL DATA:  Left-sided chest pain radiating to the left arm for 2 days. Reports dyspnea. Nonproductive cough this week. EXAM: CT CHEST WITH CONTRAST TECHNIQUE: Multidetector CT imaging of the chest was performed during intravenous contrast administration. CONTRAST:  35mL ISOVUE-300 IOPAMIDOL (ISOVUE-300) INJECTION 61% COMPARISON:  CXR from earlier on the same day. FINDINGS: Cardiovascular: No large central pulmonary embolus. Cardiomegaly small pericardial effusion. Coronary arteriosclerosis is seen. No thoracic aortic aneurysm or dissection. Aortic atherosclerosis. Mediastinum/Nodes: Heterogeneous substernal goiter slightly deviating the trachea to the right. 9 mm or less short axis mediastinal lymph nodes are noted possibly reactive in etiology. The trachea and mainstem bronchi appear patent. The thoracic esophagus is unremarkable. Lungs/Pleura: Partially loculated moderate right effusion with compressive atelectasis and bilateral lower and right middle lobe bronchiectasis. Patchy atelectasis at left lung base. No dominant mass. Mosaic ground-glass opacity in the upper lobes possibly representing sequela of CHF. Upper Abdomen: Mild nodularity of the liver surface consistent cirrhosis. No space-occupying mass of the liver, both adrenal glands, visualized spleen, pancreas and upper poles of both kidneys. There is mild ectasia of the upper pole renal collecting system bilaterally. Musculoskeletal: No acute osseous abnormality. Mid to lower thoracic and upper lumbar degenerative disc disease. IMPRESSION: Cardiomegaly with coronary arteriosclerosis. Small pericardial effusion. Aortic atherosclerosis. Substernal goiter. Partially loculated moderate right pleural effusion with  compressive atelectasis of bilateral lower lobe and right middle lobe bronchiectasis. Surface undulations of the liver may represent cirrhosis. Electronically Signed   By: Ashley Royalty M.D.   On: 02/09/2016 23:16   Dg Chest Port 1 View  Result Date: 02/09/2016 CLINICAL DATA:  Short of breath EXAM: PORTABLE CHEST 1 VIEW COMPARISON:  None. FINDINGS: Moderately large right pleural effusion with right lower lobe atelectasis or infiltrate. Mild left lower lobe airspace disease Cardiac enlargement without heart failure or edema. IMPRESSION: Moderate right pleural effusion with right lower lobe airspace disease. This could represent pneumonia or tumor. CT chest with contrast suggested. Mild left lower lobe atelectasis/infiltrate. Electronically Signed   By: Franchot Gallo M.D.   On: 02/09/2016 19:46    EKG: Atrial fibrillation with a rapid ventricular rate  ASSESSMENT AND PLAN:   1. Atrial fibrillation with a rapid ventricular rate, rate improved on Cardizem drip, chads Vasc 3 2. Chest discomfort during tachycardia, troponin negative, now resolved after rate control 3. Right pleural effusion of uncertain clinical significance  Recommendations   1. Agree with overall current therapy 2. Convert Cardizem drip  to by mouth Cardizem 3. Start Eliquis 5 mg twice a day. Discussed the risks, benefits and alternatives of chronic anticoagulation, as well as, the advantages and disadvantages of normal oral anticoagulants versus warfarin. 4. Review 2-D echocardiogram 5. Further recommendations pending echocardiogram results  Signed: Isaias Cowman MD,PhD, [facility] 02/10/2016, 9:23 AM

## 2016-02-10 NOTE — Progress Notes (Signed)
*  PRELIMINARY RESULTS* Echocardiogram 2D Echocardiogram has been performed.  Norma Hill 02/10/2016, 3:17 PM

## 2016-02-10 NOTE — Progress Notes (Signed)
ANTICOAGULATION CONSULT NOTE - Initial Consult  Pharmacy Consult for enoxaparin Indication: atrial fibrillation  No Known Allergies  Patient Measurements: Height: 5\' 3"  (160 cm) Weight: 169 lb 11.2 oz (77 kg) IBW/kg (Calculated) : 52.4 Heparin Dosing Weight:   Vital Signs: Temp: 98.1 F (36.7 C) (12/07 0059) Temp Source: Oral (12/07 0059) BP: 167/96 (12/07 0059) Pulse Rate: 108 (12/07 0059)  Labs:  Recent Labs  02/09/16 1908  HGB 14.4  HCT 42.9  PLT 232  CREATININE 0.70  TROPONINI <0.03    Estimated Creatinine Clearance: 62.4 mL/min (by C-G formula based on SCr of 0.7 mg/dL).   Medical History: Past Medical History:  Diagnosis Date  . Bowel trouble 2012   ocasionally  . Cancer ([place]) 10/2000   pt had 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 Pt then had a repeat excision on 11/16/2000  . Hypertension 2006  . Personal history of malignant neoplasm of breast 2002  . Solitary cyst of breast 2012  . Special screening for malignant neoplasms, colon     Medications:  Infusions:  . sodium chloride    . diltiazem (CARDIZEM) infusion 10 mg/hr (02/09/16 2220)    Assessment: 70 yof cc CP SOB. No PTA AC per med list. Pharmacy consulted to dose LMWH for AF.  Goal of Therapy:  Anti-Xa level 0.6-1 units/ml 4hrs after LMWH dose given Monitor platelets by anticoagulation protocol: Yes   Plan:  Lovenox 75 mg subcutaneously BID.  Laural Benes, Pharm.D., BCPS Clinical Pharmacist 02/10/2016,1:22 AM

## 2016-02-10 NOTE — Care Management (Signed)
Patient admitted on cardizem drip for new onset of atrial fib with rvr.  she is to discharge on Eliquis.  Provided with 30 day coupon. Independent in all adls, denies issues accessing medical care, obtaining medications or with transportation.  Current with her PCP.

## 2016-02-11 LAB — BASIC METABOLIC PANEL
Anion gap: 7 (ref 5–15)
BUN: 14 mg/dL (ref 6–20)
CALCIUM: 9.1 mg/dL (ref 8.9–10.3)
CHLORIDE: 107 mmol/L (ref 101–111)
CO2: 24 mmol/L (ref 22–32)
CREATININE: 0.65 mg/dL (ref 0.44–1.00)
GFR calc non Af Amer: >60 mL/min (ref >60)
GLUCOSE: 104 mg/dL — AB (ref 65–99)
Potassium: 3.4 mmol/L — ABNORMAL LOW (ref 3.5–5.1)
Sodium: 138 mmol/L (ref 135–145)

## 2016-02-11 LAB — ECHOCARDIOGRAM COMPLETE
Height: 63 in
Weight: 2715.2 oz

## 2016-02-11 MED ORDER — APIXABAN 5 MG PO TABS: 5.0000 mg | ORAL_TABLET | Freq: Two times a day (BID) | ORAL | 0 refills | Status: DC

## 2016-02-11 MED ORDER — DILTIAZEM HCL ER COATED BEADS 240 MG PO TB24: 240.0000 mg | ORAL_TABLET | Freq: Every day | ORAL | 0 refills | Status: DC

## 2016-02-11 NOTE — Discharge Summary (Signed)
Norma [facility] at [facility] NAME: Norma Hill    MR#:  [id number]  DATE OF BIRTH:  1945-08-06  DATE OF ADMISSION:  02/09/2016 ADMITTING PHYSICIAN: Harvie Bridge, DO  DATE OF DISCHARGE: 02/11/16  PRIMARY CARE PHYSICIAN: No primary care provider on file.    ADMISSION DIAGNOSIS:  Pleural effusion [J90] Atrial fibrillation with rapid ventricular response (HCC) [I48.91]  DISCHARGE DIAGNOSIS:  Active Problems:   New onset atrial fibrillation (HCC)   SECONDARY DIAGNOSIS:   Past Medical History:  Diagnosis Date  . Bowel trouble 2012   ocasionally  . Cancer ([place]) 10/2000   pt had 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 Pt then had a repeat excision on 11/16/2000  . Hypertension 2006  . Personal history of malignant neoplasm of breast 2002  . Solitary cyst of breast 2012  . Special screening for malignant neoplasms, colon     HOSPITAL COURSE:  Norma Hill  is a 70 y.o. female admitted 02/09/2016 with chief complaint Chest Pain and Shortness of Breath . Please see H&P performed by Harvie Bridge, DO for further information. Patient presented with the above symptoms, found to be in atrial fib with RVR on admission. She was started on cardizem drip and evaluated by cardiology. She was successfully transitioned to oral cardizem  DISCHARGE CONDITIONS:   stable  CONSULTS OBTAINED:  Treatment Team:  Isaias Cowman, MD  DRUG ALLERGIES:  No Known Allergies  DISCHARGE MEDICATIONS:   Current Discharge Medication List    START taking these medications   Details  apixaban (ELIQUIS) 5 MG TABS tablet Take 1 tablet (5 mg total) by mouth 2 (two) times daily. Qty: 60 tablet, Refills: 0    diltiazem (CARDIZEM LA) 240 MG 24 hr tablet Take 1 tablet (240 mg total) by mouth daily. Qty: 30 tablet, Refills: 0      CONTINUE these medications which have NOT CHANGED   Details  aspirin EC 81 MG  tablet Take 81 mg by mouth daily.    Multiple Vitamin (MULTIVITAMIN WITH MINERALS) TABS tablet Take 1 tablet by mouth daily.         DISCHARGE INSTRUCTIONS:    DIET:  Regular diet  DISCHARGE CONDITION:  Stable  ACTIVITY:  Activity as tolerated  OXYGEN:  Home Oxygen: No.   Oxygen Delivery: room air  DISCHARGE LOCATION:  home   If you experience worsening of your admission symptoms, develop shortness of breath, life threatening emergency, suicidal or homicidal thoughts you must seek medical attention immediately by calling 911 or calling your MD immediately  if symptoms less severe.  You Must read complete instructions/literature along with all the possible adverse reactions/side effects for all the Medicines you take and that have been prescribed to you. Take any new Medicines after you have completely understood and accpet all the possible adverse reactions/side effects.   Please note  You were cared for by a hospitalist during your hospital stay. If you have any questions about your discharge medications or the care you received while you were in the hospital after you are discharged, you can call the unit and asked to speak with the hospitalist on call if the hospitalist that took care of you is not available. Once you are discharged, your primary care physician will handle any further medical issues. Please note that NO REFILLS for any discharge medications will be authorized once you are discharged, as it is imperative that you return to your  primary care physician (or establish a relationship with a primary care physician if you do not have one) for your aftercare needs so that they can reassess your need for medications and monitor your lab values.    On the day of Discharge:   VITAL SIGNS:  Blood pressure (!) 176/80, pulse 88, temperature 98.3 F (36.8 C), temperature source Oral, resp. rate 18, height 5\' 3"  (1.6 m), weight 77 kg (169 lb 11.2 oz), SpO2 92 %.  I/O:     Intake/Output Summary (Last 24 hours) at 02/11/16 1056 Last data filed at 02/11/16 0937  Gross per 24 hour  Intake              480 ml  Output              850 ml  Net             -370 ml    PHYSICAL EXAMINATION:  GENERAL:  70 y.o.-year-old patient lying in the bed with no acute distress.  EYES: Pupils equal, round, reactive to light and accommodation. No scleral icterus. Extraocular muscles intact.  HEENT: Head atraumatic, normocephalic. Oropharynx and nasopharynx clear.  NECK:  Supple, no jugular venous distention. No thyroid enlargement, no tenderness.  LUNGS: Normal breath sounds bilaterally, no wheezing, rales,rhonchi or crepitation. No use of accessory muscles of respiration.  CARDIOVASCULAR: S1, S2 irregular. No murmurs, rubs, or gallops.  ABDOMEN: Soft, non-tender, non-distended. Bowel sounds present. No organomegaly or mass.  EXTREMITIES: No pedal edema, cyanosis, or clubbing.  NEUROLOGIC: Cranial nerves II through XII are intact. Muscle strength 5/5 in all extremities. Sensation intact. Gait not checked.  PSYCHIATRIC: The patient is alert and oriented x 3.  SKIN: No obvious rash, lesion, or ulcer.   DATA REVIEW:   CBC  Recent Labs Lab 02/10/16 0723  WBC 8.2  HGB 13.3  HCT 39.2  PLT 200    Chemistries   Recent Labs Lab 02/09/16 1908 02/10/16 0138  02/11/16 0505  NA 140  --   < > 138  K 2.9*  --   < > 3.4*  CL 104  --   < > 107  CO2 27  --   < > 24  GLUCOSE 121*  --   < > 104*  BUN 11  --   < > 14  CREATININE 0.70  --   < > 0.65  CALCIUM 9.6  --   < > 9.1  MG 2.2 2.0  --   --   AST 29  --   --   --   ALT 27  --   --   --   ALKPHOS 83  --   --   --   BILITOT 0.4  --   --   --   < > = values in this interval not displayed.  Cardiac Enzymes  Recent Labs Lab 02/10/16 1320  TROPONINI <0.03    Microbiology Results  Results for orders placed or performed during the hospital encounter of 02/09/16  Culture, blood (Routine X 2) w Reflex to ID Panel      Status: None (Preliminary result)   Collection Time: 02/09/16  9:13 PM  Result Value Ref Range Status   Specimen Description BLOOD  R AC  Final   Special Requests   Final    BOTTLES DRAWN AEROBIC AND ANAEROBIC  AER 12 ML ANA 5 ML    Culture NO GROWTH 2 DAYS  Final   Report Status PENDING  Incomplete  Culture, blood (Routine X 2) w Reflex to ID Panel     Status: None (Preliminary result)   Collection Time: 02/09/16  9:13 PM  Result Value Ref Range Status   Specimen Description BLOOD  R FA  Final   Special Requests   Final    BOTTLES DRAWN AEROBIC AND ANAEROBIC  AER 12 ML ANA 6 ML    Culture NO GROWTH 2 DAYS  Final   Report Status PENDING  Incomplete    RADIOLOGY:  Ct Chest W Contrast  Result Date: 02/09/2016 CLINICAL DATA:  Left-sided chest pain radiating to the left arm for 2 days. Reports dyspnea. Nonproductive cough this week. EXAM: CT CHEST WITH CONTRAST TECHNIQUE: Multidetector CT imaging of the chest was performed during intravenous contrast administration. CONTRAST:  75mL ISOVUE-300 IOPAMIDOL (ISOVUE-300) INJECTION 61% COMPARISON:  CXR from earlier on the same day. FINDINGS: Cardiovascular: No large central pulmonary embolus. Cardiomegaly small pericardial effusion. Coronary arteriosclerosis is seen. No thoracic aortic aneurysm or dissection. Aortic atherosclerosis. Mediastinum/Nodes: Heterogeneous substernal goiter slightly deviating the trachea to the right. 9 mm or less short axis mediastinal lymph nodes are noted possibly reactive in etiology. The trachea and mainstem bronchi appear patent. The thoracic esophagus is unremarkable. Lungs/Pleura: Partially loculated moderate right effusion with compressive atelectasis and bilateral lower and right middle lobe bronchiectasis. Patchy atelectasis at left lung base. No dominant mass. Mosaic ground-glass opacity in the upper lobes possibly representing sequela of CHF. Upper Abdomen: Mild nodularity of the liver surface consistent cirrhosis.  No space-occupying mass of the liver, both adrenal glands, visualized spleen, pancreas and upper poles of both kidneys. There is mild ectasia of the upper pole renal collecting system bilaterally. Musculoskeletal: No acute osseous abnormality. Mid to lower thoracic and upper lumbar degenerative disc disease. IMPRESSION: Cardiomegaly with coronary arteriosclerosis. Small pericardial effusion. Aortic atherosclerosis. Substernal goiter. Partially loculated moderate right pleural effusion with compressive atelectasis of bilateral lower lobe and right middle lobe bronchiectasis. Surface undulations of the liver may represent cirrhosis. Electronically Signed   By: Ashley Royalty M.D.   On: 02/09/2016 23:16   Dg Chest Port 1 View  Result Date: 02/09/2016 CLINICAL DATA:  Short of breath EXAM: PORTABLE CHEST 1 VIEW COMPARISON:  None. FINDINGS: Moderately large right pleural effusion with right lower lobe atelectasis or infiltrate. Mild left lower lobe airspace disease Cardiac enlargement without heart failure or edema. IMPRESSION: Moderate right pleural effusion with right lower lobe airspace disease. This could represent pneumonia or tumor. CT chest with contrast suggested. Mild left lower lobe atelectasis/infiltrate. Electronically Signed   By: Franchot Gallo M.D.   On: 02/09/2016 19:46     Management plans discussed with the patient, family and they are in agreement.  CODE STATUS:     Code Status Orders        Start     Ordered   02/10/16 0053  Full code  Continuous     02/10/16 0052    Code Status History    Date Active Date Inactive Code Status Order ID Comments User Context   This patient has a current code status but no historical code status.      TOTAL TIME TAKING CARE OF THIS PATIENT: 33 minutes.    Hower,  Karenann Cai.D on 02/11/2016 at 10:56 AM  Between 7am to 6pm - Pager - [phone]  After 6pm go to [email or web address] - [employer] [place] [facility]    Office  [phone]  CC: Primary care physician;  No primary care provider on file.

## 2016-02-11 NOTE — Care Management Important Message (Signed)
Important Message  Patient Details  Name: Narges Dilg MRN: [id number] Date of Birth: Nov 07, 1945   Medicare Important Message Given:  N/A - LOS <3 / Initial given by admissions    Katrina Stack, RN 02/11/2016, 10:57 AM

## 2016-02-11 NOTE — Care Management (Signed)
No other discharge needs. Discharging home today

## 2016-02-14 LAB — CULTURE, BLOOD (ROUTINE X 2)
CULTURE: NO GROWTH
CULTURE: NO GROWTH

## 2016-02-22 DIAGNOSIS — I10 Essential (primary) hypertension: Secondary | ICD-10-CM | POA: Insufficient documentation

## 2016-03-16 ENCOUNTER — Ambulatory Visit: Payer: Medicare Other | Admitting: Anesthesiology

## 2016-03-16 ENCOUNTER — Encounter
Admission: RE | Disposition: A | Discharge status: Home / Self Care | Payer: Self-pay | Source: Ambulatory Visit | Attending: Cardiology

## 2016-03-16 ENCOUNTER — Ambulatory Visit
Admission: RE | Admit: 2016-03-16 | Discharge: 2016-03-16 | Disposition: A | Discharge status: Home / Self Care | Payer: Medicare Other | Source: Ambulatory Visit | Attending: Cardiology | Admitting: Cardiology

## 2016-03-16 DIAGNOSIS — I10 Essential (primary) hypertension: Secondary | ICD-10-CM | POA: Insufficient documentation

## 2016-03-16 DIAGNOSIS — Z7901 Long term (current) use of anticoagulants: Secondary | ICD-10-CM | POA: Insufficient documentation

## 2016-03-16 DIAGNOSIS — I4891 Unspecified atrial fibrillation: Secondary | ICD-10-CM | POA: Diagnosis not present

## 2016-03-16 DIAGNOSIS — Z79899 Other long term (current) drug therapy: Secondary | ICD-10-CM | POA: Insufficient documentation

## 2016-03-16 HISTORY — PX: ELECTROPHYSIOLOGIC STUDY: SHX172A

## 2016-03-16 SURGERY — CARDIOVERSION (CATH LAB)
Anesthesia: General

## 2016-03-16 MED ORDER — ONDANSETRON HCL 4 MG/2ML IJ SOLN: 4.0000 mg | Freq: Once | INTRAMUSCULAR | Status: DC | PRN

## 2016-03-16 MED ORDER — PROPOFOL 10 MG/ML IV BOLUS
INTRAVENOUS | Status: DC | PRN
  Administered 2016-03-16: 50 mg via INTRAVENOUS

## 2016-03-16 MED ORDER — PROPOFOL 10 MG/ML IV BOLUS
INTRAVENOUS | Status: AC
  Filled 2016-03-16: qty 20

## 2016-03-16 MED ORDER — SODIUM CHLORIDE 0.9 % IV SOLN
INTRAVENOUS | Status: DC | PRN
  Administered 2016-03-16: 07:00:00 via INTRAVENOUS

## 2016-03-16 MED ORDER — DILTIAZEM HCL ER COATED BEADS 240 MG PO CP24
240.0000 mg | ORAL_CAPSULE | Freq: Once | ORAL | Status: AC
  Administered 2016-03-16: 240 mg via ORAL
  Filled 2016-03-16 (×2): qty 1

## 2016-03-16 MED ORDER — FENTANYL CITRATE (PF) 100 MCG/2ML IJ SOLN: 25.0000 ug | INTRAMUSCULAR | Status: DC | PRN

## 2016-03-16 NOTE — Op Note (Signed)
[facility]   03/16/2016                     7:30 AM  PATIENT:  Norma Hill    PRE-OPERATIVE DIAGNOSIS:  Cardioversion    Afib  POST-OPERATIVE DIAGNOSIS:  Same  PROCEDURE:  CARDIOVERSION  SURGEON:  Isaias Cowman, MD    ANESTHESIA:     PREOPERATIVE INDICATIONS:  [name] is a  71 y.o. female with a diagnosis of Cardioversion    Afib who failed conservative measures and elected for surgical management.    The risks benefits and alternatives were discussed with the patient preoperatively including but not limited to the risks of infection, bleeding, cardiopulmonary complications, the need for revision surgery, among others, and the patient was willing to proceed.   OPERATIVE PROCEDURE: The patient was brought to the special procedures holding area in a fasting state. Patient received 50 mg of propofol. She underwent successive cardioversions at 50, 150 and 200 J with conversion to sinus tachycardia. There were no periprocedural complications.

## 2016-03-16 NOTE — Transfer of Care (Signed)
Immediate Anesthesia Transfer of Care Note  Patient: Norma Hill  Procedure(s) Performed: Procedure(s): CARDIOVERSION (N/A)  Patient Location: Cath Lab  Anesthesia Type:General  Level of Consciousness: sedated and responds to stimulation  Airway & Oxygen Therapy: Patient Spontanous Breathing and Patient connected to nasal cannula oxygen  Post-op Assessment: Report given to RN and Post -op Vital signs reviewed and stable  Post vital signs: Reviewed and stable  Last Vitals:  Vitals:   03/16/16 0725 03/16/16 0726  BP:  (!) 142/85  Pulse: (!) 101 (!) 103  Resp: 20 (!) 22  Temp:      Last Pain:  Vitals:   03/16/16 0639  TempSrc: Oral         Complications: No apparent anesthesia complications

## 2016-03-16 NOTE — Anesthesia Preprocedure Evaluation (Signed)
Anesthesia Evaluation  Patient identified by MRN, date of birth, ID band Patient awake    Reviewed: Allergy & Precautions, H&P , NPO status , Patient's Chart, lab work & pertinent test results, reviewed documented beta blocker date and time   Airway Mallampati: II   Neck ROM: full    Dental  (+) Poor Dentition   Pulmonary neg pulmonary ROS,    Pulmonary exam normal        Cardiovascular hypertension, negative cardio ROS Normal cardiovascular examAtrial Fibrillation  Rhythm:irregular Rate:Normal     Neuro/Psych negative neurological ROS  negative psych ROS   GI/Hepatic negative GI ROS, Neg liver ROS,   Endo/Other  negative endocrine ROS  Renal/GU negative Renal ROS  negative genitourinary   Musculoskeletal   Abdominal   Peds  Hematology negative hematology ROS (+)   Anesthesia Other Findings Past Medical History: 2012: Bowel trouble     Comment: ocasionally 10/2000: Cancer ([place])     Comment: pt had wide excision right breast for insitu               lobular carcinoma and invasive lobular               carcinoma. Pt then had a repeat excision on               11/16/2000 2006: Hypertension 2002: Personal history of malignant neoplasm of brea* 2012: Solitary cyst of breast No date: Special screening for malignant neoplasms, col* Past Surgical History: 2002: BREAST BIOPSY Right 2002: BREAST SURGERY Right     Comment: wide excision and sn x4 done 10/29/00 with               repeat excision done on 11/16/2000 with post               operative radiation treatment and 5 years of               tamoxifen with completion in 2007 2002: CHOLECYSTECTOMY 2008: COLONOSCOPY 1990,s: DILATION AND CURETTAGE OF UTERUS BMI    Body Mass Index:  27.90 kg/m     Reproductive/Obstetrics negative OB ROS                             Anesthesia Physical Anesthesia Plan  ASA: III  Anesthesia Plan: General    Post-op Pain Management:    Induction:   Airway Management Planned:   Additional Equipment:   Intra-op Plan:   Post-operative Plan:   Informed Consent: I have reviewed the patients History and Physical, chart, labs and discussed the procedure including the risks, benefits and alternatives for the proposed anesthesia with the patient or authorized representative who has indicated his/her understanding and acceptance.   Dental Advisory Given  Plan Discussed with: CRNA  Anesthesia Plan Comments:         Anesthesia Quick Evaluation

## 2016-03-17 ENCOUNTER — Encounter: Payer: Self-pay | Admitting: Cardiology

## 2016-03-17 NOTE — Anesthesia Postprocedure Evaluation (Signed)
Anesthesia Post Note  Patient: Norma Hill  Procedure(s) Performed: Procedure(s) (LRB): CARDIOVERSION (N/A)  Patient location during evaluation: PACU Anesthesia Type: General Level of consciousness: awake and alert Pain management: pain level controlled Vital Signs Assessment: post-procedure vital signs reviewed and stable Respiratory status: spontaneous breathing, nonlabored ventilation, respiratory function stable and patient connected to nasal cannula oxygen Cardiovascular status: blood pressure returned to baseline and stable Postop Assessment: no signs of nausea or vomiting Anesthetic complications: no     Last Vitals:  Vitals:   03/16/16 0745 03/16/16 0800  BP: 139/81 (!) 158/96  Pulse: 89   Resp: 14 15  Temp:      Last Pain:  Vitals:   03/16/16 0639  TempSrc: Oral                 Molli Barrows

## 2016-04-18 ENCOUNTER — Ambulatory Visit (INDEPENDENT_AMBULATORY_CARE_PROVIDER_SITE_OTHER): Payer: Medicare Other | Admitting: Family Medicine

## 2016-04-18 ENCOUNTER — Encounter: Payer: Self-pay | Admitting: Family Medicine

## 2016-04-18 VITALS — BP 185/117 | HR 96 | Ht 63.0 in | Wt 160.0 lb

## 2016-04-18 DIAGNOSIS — I4891 Unspecified atrial fibrillation: Secondary | ICD-10-CM

## 2016-04-18 NOTE — Assessment & Plan Note (Signed)
Resolved with ambulation patient now asymptomatic.

## 2016-04-18 NOTE — Progress Notes (Signed)
   BP (!) 185/117   Pulse 96   Ht 5\' 3"  (1.6 m)   Wt 160 lb (72.6 kg)   SpO2 97%   BMI 28.34 kg/m    Subjective:    Patient ID: Norma Hill, female    DOB: 1945/07/15, 71 y.o.   MRN: [id number]  HPI: Norma Hill is a 72 y.o. female  Chief Complaint  Patient presents with  . Hospitalization Follow-up    [facility]   Patient with extensive notes from [facility] which were reviewed showing cardiac Ablation for bradycardia tachycardia syndrome. Patient feeling much better. Patient taking colchicine for unknown reason no gout symptoms. On chart review was started after cardiac Ablation done by cardiologist for 2 weeks twice a day. Patient no complaints from bleeding bruising issues with Eliquis. No complaints from medications lightheaded dizzy. Relevant past medical, surgical, family and social history reviewed and updated as indicated. Interim medical history since our last visit reviewed. Allergies and medications reviewed and updated.  Review of Systems  Constitutional: Negative.   Respiratory: Negative.   Cardiovascular: Negative.     Per HPI unless specifically indicated above     Objective:    BP (!) 185/117   Pulse 96   Ht 5\' 3"  (1.6 m)   Wt 160 lb (72.6 kg)   SpO2 97%   BMI 28.34 kg/m   Wt Readings from Last 3 Encounters:  04/18/16 160 lb (72.6 kg)  03/16/16 160 lb (72.6 kg)  02/10/16 169 lb 11.2 oz (77 kg)    Physical Exam  Constitutional: She is oriented to person, place, and time. She appears well-developed and well-nourished. No distress.  HENT:  Head: Normocephalic and atraumatic.  Right Ear: Hearing normal.  Left Ear: Hearing normal.  Nose: Nose normal.  Eyes: Conjunctivae and lids are normal. Right eye exhibits no discharge. Left eye exhibits no discharge. No scleral icterus.  Cardiovascular: Normal rate, regular rhythm and normal heart sounds.   Pulmonary/Chest: Effort normal and breath sounds normal. No respiratory distress.  Musculoskeletal:  Normal range of motion.  Neurological: She is alert and oriented to person, place, and time.  Skin: Skin is intact. No rash noted.  Psychiatric: She has a normal mood and affect. Her speech is normal and behavior is normal. Judgment and thought content normal. Cognition and memory are normal.        Assessment & Plan:   Problem List Items Addressed This Visit      Cardiovascular and Mediastinum   New onset atrial fibrillation ([place]) - Primary    Resolved with ambulation patient now asymptomatic.      Relevant Medications   diltiazem (CARDIZEM CD) 120 MG 24 hr capsule   Other Relevant Orders   Basic metabolic panel      Will return for physical earlier if problems. Protonix also stable for reflux symptoms.  Follow up plan: Return for Physical Exam due in June.

## 2016-04-19 ENCOUNTER — Encounter: Payer: Self-pay | Admitting: Family Medicine

## 2016-04-19 LAB — BASIC METABOLIC PANEL
BUN / CREAT RATIO: 13 (ref 12–28)
BUN: 10 mg/dL (ref 8–27)
CALCIUM: 9.9 mg/dL (ref 8.7–10.3)
CHLORIDE: 102 mmol/L (ref 96–106)
CO2: 27 mmol/L (ref 18–29)
Creatinine, Ser: 0.76 mg/dL (ref 0.57–1.00)
GFR, EST AFRICAN AMERICAN: 91 mL/min/{1.73_m2} (ref >59)
GFR, EST NON AFRICAN AMERICAN: 79 mL/min/{1.73_m2} (ref >59)
Glucose: 90 mg/dL (ref 65–99)
Potassium: 4 mmol/L (ref 3.5–5.2)
Sodium: 144 mmol/L (ref 134–144)

## 2016-10-19 ENCOUNTER — Telehealth: Payer: Self-pay | Admitting: Family Medicine

## 2016-10-19 NOTE — Telephone Encounter (Signed)
Called pt to schedule for Annual Wellness Visit with Nurse Health Advisor, Tiffany Hill, my c/b # is [phone]  Kathryn Brown ° °

## 2016-11-08 ENCOUNTER — Telehealth: Payer: Self-pay | Admitting: Family Medicine

## 2016-11-08 NOTE — Telephone Encounter (Signed)
Called pt to schedule for Annual Wellness Visit with Nurse Health Advisor, Tiffany Hill, my c/b # is [phone]  Kathryn Brown ° °

## 2016-11-09 ENCOUNTER — Telehealth: Payer: Self-pay | Admitting: Family Medicine

## 2016-11-30 ENCOUNTER — Telehealth: Payer: Self-pay | Admitting: Family Medicine

## 2017-05-12 ENCOUNTER — Other Ambulatory Visit: Payer: Self-pay

## 2017-05-12 ENCOUNTER — Encounter: Payer: Self-pay | Admitting: Emergency Medicine

## 2017-05-12 ENCOUNTER — Inpatient Hospital Stay
Admission: EM | Admit: 2017-05-12 | Discharge: 2017-05-16 | DRG: 186 | Disposition: A | Discharge status: Home / Self Care | Payer: Medicare Other | Attending: Internal Medicine | Admitting: Internal Medicine

## 2017-05-12 ENCOUNTER — Emergency Department: Payer: Medicare Other

## 2017-05-12 ENCOUNTER — Observation Stay: Payer: Medicare Other

## 2017-05-12 DIAGNOSIS — Z7901 Long term (current) use of anticoagulants: Secondary | ICD-10-CM

## 2017-05-12 DIAGNOSIS — Z853 Personal history of malignant neoplasm of breast: Secondary | ICD-10-CM

## 2017-05-12 DIAGNOSIS — I10 Essential (primary) hypertension: Secondary | ICD-10-CM | POA: Diagnosis present

## 2017-05-12 DIAGNOSIS — R000 Tachycardia, unspecified: Secondary | ICD-10-CM | POA: Diagnosis present

## 2017-05-12 DIAGNOSIS — Z803 Family history of malignant neoplasm of breast: Secondary | ICD-10-CM

## 2017-05-12 DIAGNOSIS — Z79899 Other long term (current) drug therapy: Secondary | ICD-10-CM

## 2017-05-12 DIAGNOSIS — J90 Pleural effusion, not elsewhere classified: Secondary | ICD-10-CM | POA: Diagnosis not present

## 2017-05-12 DIAGNOSIS — J9601 Acute respiratory failure with hypoxia: Secondary | ICD-10-CM | POA: Diagnosis present

## 2017-05-12 DIAGNOSIS — R0600 Dyspnea, unspecified: Secondary | ICD-10-CM

## 2017-05-12 DIAGNOSIS — Z923 Personal history of irradiation: Secondary | ICD-10-CM

## 2017-05-12 DIAGNOSIS — J9811 Atelectasis: Secondary | ICD-10-CM | POA: Diagnosis present

## 2017-05-12 DIAGNOSIS — R0602 Shortness of breath: Secondary | ICD-10-CM | POA: Diagnosis not present

## 2017-05-12 DIAGNOSIS — I4891 Unspecified atrial fibrillation: Secondary | ICD-10-CM

## 2017-05-12 DIAGNOSIS — Z9049 Acquired absence of other specified parts of digestive tract: Secondary | ICD-10-CM

## 2017-05-12 DIAGNOSIS — I481 Persistent atrial fibrillation: Secondary | ICD-10-CM | POA: Diagnosis present

## 2017-05-12 DIAGNOSIS — J939 Pneumothorax, unspecified: Secondary | ICD-10-CM

## 2017-05-12 HISTORY — DX: Unspecified atrial fibrillation: I48.91

## 2017-05-12 LAB — BASIC METABOLIC PANEL
Anion gap: 9 (ref 5–15)
BUN: 12 mg/dL (ref 6–20)
CHLORIDE: 104 mmol/L (ref 101–111)
CO2: 25 mmol/L (ref 22–32)
CREATININE: 0.9 mg/dL (ref 0.44–1.00)
Calcium: 9.3 mg/dL (ref 8.9–10.3)
GFR calc Af Amer: >60 mL/min (ref >60)
GFR calc non Af Amer: >60 mL/min (ref >60)
Glucose, Bld: 142 mg/dL — ABNORMAL HIGH (ref 65–99)
Potassium: 3.3 mmol/L — ABNORMAL LOW (ref 3.5–5.1)
Sodium: 138 mmol/L (ref 135–145)

## 2017-05-12 LAB — FIBRIN DERIVATIVES D-DIMER (ARMC ONLY): Fibrin derivatives D-dimer (ARMC): 696.34 ng/mL (FEU) — ABNORMAL HIGH (ref 0.00–499.00)

## 2017-05-12 LAB — CBC
HEMATOCRIT: 42.6 % (ref 35.0–47.0)
Hemoglobin: 14.2 g/dL (ref 12.0–16.0)
MCH: 30.3 pg (ref 26.0–34.0)
MCHC: 33.3 g/dL (ref 32.0–36.0)
MCV: 91.1 fL (ref 80.0–100.0)
PLATELETS: 215 10*3/uL (ref 150–440)
RBC: 4.67 MIL/uL (ref 3.80–5.20)
RDW: 15.2 % — AB (ref 11.5–14.5)
WBC: 7.9 10*3/uL (ref 3.6–11.0)

## 2017-05-12 LAB — TROPONIN I: Troponin I: <0.03 ng/mL (ref <0.03)

## 2017-05-12 LAB — MAGNESIUM: MAGNESIUM: 2.1 mg/dL (ref 1.7–2.4)

## 2017-05-12 LAB — TSH: TSH: 0.862 u[IU]/mL (ref 0.350–4.500)

## 2017-05-12 MED ORDER — DILTIAZEM HCL 25 MG/5ML IV SOLN
10.0000 mg | Freq: Once | INTRAVENOUS | Status: AC
  Administered 2017-05-12: 10 mg via INTRAVENOUS
  Filled 2017-05-12: qty 5

## 2017-05-12 MED ORDER — ACETAMINOPHEN 325 MG PO TABS
650.0000 mg | ORAL_TABLET | Freq: Four times a day (QID) | ORAL | Status: DC | PRN
  Administered 2017-05-14: 650 mg via ORAL
  Filled 2017-05-12: qty 2

## 2017-05-12 MED ORDER — DILTIAZEM HCL ER COATED BEADS 120 MG PO CP24
120.0000 mg | ORAL_CAPSULE | Freq: Every day | ORAL | Status: DC
  Administered 2017-05-12: 120 mg via ORAL
  Filled 2017-05-12: qty 1

## 2017-05-12 MED ORDER — LISINOPRIL 10 MG PO TABS
10.0000 mg | ORAL_TABLET | Freq: Every day | ORAL | Status: DC
  Administered 2017-05-12 – 2017-05-16 (×5): 10 mg via ORAL
  Filled 2017-05-12 (×5): qty 1

## 2017-05-12 MED ORDER — DILTIAZEM HCL 25 MG/5ML IV SOLN
5.0000 mg | Freq: Once | INTRAVENOUS | Status: AC
  Administered 2017-05-12: 5 mg via INTRAVENOUS

## 2017-05-12 MED ORDER — DILTIAZEM HCL 25 MG/5ML IV SOLN
10.0000 mg | Freq: Once | INTRAVENOUS | Status: AC
  Administered 2017-05-12: 10 mg via INTRAVENOUS

## 2017-05-12 MED ORDER — POTASSIUM CHLORIDE CRYS ER 20 MEQ PO TBCR
40.0000 meq | EXTENDED_RELEASE_TABLET | Freq: Once | ORAL | Status: AC
  Administered 2017-05-12: 40 meq via ORAL
  Filled 2017-05-12: qty 2

## 2017-05-12 MED ORDER — DILTIAZEM HCL ER COATED BEADS 120 MG PO CP24: 120.0000 mg | ORAL_CAPSULE | Freq: Every day | ORAL | 11 refills | Status: DC

## 2017-05-12 MED ORDER — IOPAMIDOL (ISOVUE-370) INJECTION 76%
75.0000 mL | Freq: Once | INTRAVENOUS | Status: AC | PRN
  Administered 2017-05-12: 75 mL via INTRAVENOUS

## 2017-05-12 MED ORDER — ADULT MULTIVITAMIN W/MINERALS CH
1.0000 | ORAL_TABLET | Freq: Every day | ORAL | Status: DC
  Administered 2017-05-13 – 2017-05-16 (×4): 1 via ORAL
  Filled 2017-05-12 (×4): qty 1

## 2017-05-12 MED ORDER — ACETAMINOPHEN 650 MG RE SUPP: 650.0000 mg | Freq: Four times a day (QID) | RECTAL | Status: DC | PRN

## 2017-05-12 MED ORDER — DILTIAZEM HCL 60 MG PO TABS
60.0000 mg | ORAL_TABLET | Freq: Three times a day (TID) | ORAL | Status: DC
  Administered 2017-05-12 – 2017-05-15 (×8): 60 mg via ORAL
  Filled 2017-05-12: qty 1
  Filled 2017-05-12: qty 2
  Filled 2017-05-12 (×4): qty 1
  Filled 2017-05-12: qty 2
  Filled 2017-05-12: qty 1
  Filled 2017-05-12 (×2): qty 2
  Filled 2017-05-12 (×2): qty 1
  Filled 2017-05-12 (×2): qty 2
  Filled 2017-05-12 (×2): qty 1

## 2017-05-12 MED ORDER — SODIUM CHLORIDE 0.9 % IV BOLUS (SEPSIS)
1000.0000 mL | Freq: Once | INTRAVENOUS | Status: AC
  Administered 2017-05-12: 1000 mL via INTRAVENOUS

## 2017-05-12 MED ORDER — METOPROLOL TARTRATE 5 MG/5ML IV SOLN: 5.0000 mg | Freq: Once | INTRAVENOUS | Status: DC

## 2017-05-12 MED ORDER — APIXABAN 5 MG PO TABS
5.0000 mg | ORAL_TABLET | Freq: Two times a day (BID) | ORAL | Status: DC
  Administered 2017-05-12 – 2017-05-13 (×2): 5 mg via ORAL
  Filled 2017-05-12 (×2): qty 1

## 2017-05-12 MED ORDER — DIGOXIN 0.25 MG/ML IJ SOLN
0.2500 mg | Freq: Once | INTRAMUSCULAR | Status: DC
  Filled 2017-05-12: qty 1

## 2017-05-12 NOTE — ED Notes (Signed)
Per pt husband, last time pt had episode of A fib with RVR, pt went into asystole 3 times.

## 2017-05-12 NOTE — ED Triage Notes (Signed)
Pt to ed with c/o sob that started earlier today. +cough.  Pt denies chest pain. Pt states sob worse with activity. -smoker.

## 2017-05-12 NOTE — ED Notes (Signed)
Ambulated pt in hallway.  Pt stated she was "a little" ShOB while walking.  HR went up to 140s-160s at times then came back down to 116's.  Pulse ox was around 96% RA during ambulation. Pt stated that she "felt worn out" after walking.  Dr. Cinda Quest notified.

## 2017-05-12 NOTE — ED Provider Notes (Signed)
[facility] Emergency Department Provider Note   ____________________________________________   First MD Initiated Contact with Patient 05/12/17 1357     (approximate)  I have reviewed the triage vital signs and the nursing notes.   HISTORY  Chief Complaint Shortness of Breath    HPI Norma Hill is a 72 y.o. female who comes in with her husband today complaining of shortness of breath. It's unclear how long she's been short of breath and seemingly has been over a week or 2. Per both of them they thought she had the flu last week he was getting on a mission trip she seemed to get better so he went when he came back is not seeming to do very well seems to be somewhat short of breath and her heart is racing. She had an ablation for A. fib before and her husband says that she went into asystole 3 times with A. fib last time.. Says she is short of breath and has a cough she denies any chest pain. She does take eliquis.review of her old records say that she had a chest x-ray and CT scan that showed a loculated right-sided pleural effusion. This is present on today's chest x-ray as well. Her shortness of breath is stable but worse with exertion.   Past Medical History:  Diagnosis Date  . A-fib ([place])   . Bowel trouble 2012   ocasionally  . Cancer ([place]) 10/2000   pt had 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 Pt then had a repeat excision on 11/16/2000  . Hypertension 2006  . Personal history of malignant neoplasm of breast 2002  . Solitary cyst of breast 2012  . Special screening for malignant neoplasms, colon     Patient Active Problem List   Diagnosis Date Noted  . New onset atrial fibrillation ([place]) 02/09/2016  . Personal history of malignant neoplasm of breast   . Cancer ([place]) 10/04/2000    Past Surgical History:  Procedure Laterality Date  . BREAST BIOPSY Right 2002  . BREAST SURGERY Right 2002   wide excision and sn x4 done 10/29/00 with repeat excision done on 11/16/2000 with post operative radiation treatment and 5 years of tamoxifen with completion in 2007  . CHOLECYSTECTOMY  2002  . COLONOSCOPY  2008  . DILATION AND CURETTAGE OF UTERUS  1990,s  . ELECTROPHYSIOLOGIC STUDY N/A 03/16/2016   Procedure: CARDIOVERSION;  Surgeon: Isaias Cowman, MD;  Location: [facility] ORS;  Service: Cardiovascular;  Laterality: N/A;    Prior to Admission medications   Medication Sig Start Date End Date Taking? Authorizing Provider  apixaban (ELIQUIS) 5 MG TABS tablet Take 1 tablet (5 mg total) by mouth 2 (two) times daily. 02/11/16   Hower, Aaron Mose, MD  colchicine 0.6 MG tablet Take 0.6 mg by mouth. 04/12/16 04/26/16  [provider]  diltiazem (CARDIZEM CD) 120 MG 24 hr capsule Take 120 mg by mouth. 04/12/16   [provider]  diltiazem (CARDIZEM CD) 120 MG 24 hr capsule Take 1 capsule (120 mg total) by mouth daily. 05/12/17 05/12/18  Nena Polio, MD  Multiple Vitamin (MULTIVITAMIN WITH MINERALS) TABS tablet Take 1 tablet by mouth daily.    [provider]  pantoprazole (PROTONIX) 40 MG tablet Take 40 mg by mouth. 04/12/16 05/12/16  [provider]    Allergies Patient has no known allergies.  Family History  Problem Relation Age of Onset  . Cancer Other  breast, relationship not listed    Social History Social History   Tobacco Use  . Smoking status: Never Smoker  . Smokeless tobacco: Never Used  Substance Use Topics  . Alcohol use: No  . Drug use: No    Review of Systems  Constitutional: No fever/chills Eyes: No visual changes. ENT: No sore throat. Cardiovascular: Denies chest pain. Respiratory:shortness of breath. Gastrointestinal: No abdominal pain.  No nausea, no vomiting.  No diarrhea.  No constipation. Genitourinary: Negative for dysuria. Musculoskeletal: Negative for back pain. Skin: Negative for rash. Neurological: Negative for headaches,  focal weakness   ____________________________________________   PHYSICAL EXAM:  VITAL SIGNS: ED Triage Vitals  Enc Vitals Group     BP 05/12/17 1351 (!) 140/108     Pulse Rate 05/12/17 1351 (!) 167     Resp 05/12/17 1351 20     Temp 05/12/17 1351 97.6 F (36.4 C)     Temp Source 05/12/17 1351 Oral     SpO2 05/12/17 1351 (!) 89 %     Weight 05/12/17 1352 160 lb (72.6 kg)     Height --      Head Circumference --      Peak Flow --      Pain Score --      Pain Loc --      Pain Edu? --      Excl. in [place]? --     Constitutional: Alert and oriented. Well appearing and in no acute distress. Eyes: Conjunctivae are normal. Head: Atraumatic. Nose: No congestion/rhinnorhea. Mouth/Throat: Mucous membranes are moist.  Oropharynx non-erythematous. Neck: No stridor.  Cardiovascular: rapid rate, regular rhythm. Grossly normal heart sounds.  Good peripheral circulation. Respiratory: Normal respiratory effort.  No retractions. Lungs CTAB! Gastrointestinal: Soft and nontender. No distention. No abdominal bruits. No CVA tenderness. Musculoskeletal: No lower extremity tenderness nor edema.  No joint effusions. Neurologic:  Normal speech and language. No gross focal neurologic deficits are appreciated.  Skin:  Skin is warm, dry and intact. No rash noted. Psychiatric: Mood and affect are normal. Speech and behavior are normal.  ____________________________________________   LABS (all labs ordered are listed, but only abnormal results are displayed)  Labs Reviewed  BASIC METABOLIC PANEL - Abnormal; Notable for the following components:      Result Value   Potassium 3.3 (*)    Glucose, Bld 142 (*)    All other components within normal limits  CBC - Abnormal; Notable for the following components:   RDW 15.2 (*)    All other components within normal limits  FIBRIN DERIVATIVES D-DIMER ([facility] ONLY) - Abnormal; Notable for the following components:   Fibrin derivatives D-dimer [facility]) 696.34 (*)     All other components within normal limits  TROPONIN I   ____________________________________________  EKG  KG read and interpreted by me shows A. fib with RVR rate of 155 normal axis no acute ST-T wave changes ____________________________________________  RADIOLOGY  ED MD interpretation:chest x-ray read by radiology and reviewed by me shows a right-sided density which appears similar to the previous one which was read as pleural effusion with compressive atelectasis. CT abdomen done to evaluate that one.  Official radiology report(s): Dg Chest Port 1 View  Result Date: 05/12/2017 CLINICAL DATA:  Shortness of breath and cough. History of breast cancer. EXAM: PORTABLE CHEST 1 VIEW COMPARISON:  02/09/2016; chest CT - 02/09/2016 FINDINGS: Grossly unchanged cardiac silhouette and mediastinal contours with persistent obscuration of the right heart border secondary to grossly unchanged moderate  size right-sided effusion and associated right basilar heterogeneous/consolidative opacities. No new focal airspace opacities. Mild pulmonary venous congestion without frank evidence of edema. No pneumothorax. No acute osseus abnormalities. Surgical clips overlie the upper outer quadrant of the right breast. IMPRESSION: Grossly unchanged moderate-sized right-sided effusion with associated right basilar opacities, atelectasis versus infiltrate. Appearance of the chest is grossly unchanged compared to the 02/2016 examination though as there has been no interval comparisons, an acute on chronic process is not excluded. Electronically Signed   By: Sandi Mariscal M.D.   On: 05/12/2017 14:19    ____________________________________________   PROCEDURES  Procedure(s) performed: patient's heart rate and blood pressure come down with 5 the diltiazem repeated 1  Procedures  Critical Care performed:   ____________________________________________   INITIAL IMPRESSION / ASSESSMENT AND PLAN / ED COURSE  patient  walked some heart rate went up to the 140s to 160s. O2 sat stayed up. Patient's heart rate would go up and down from 140s to 160s down to 110 and back up again and back down again patient and her husband rather stay in the hospital. This is probably safer than letting them go home. I will do so.it would be best to make sure the patient's heart rate is stable without being tachycardic. Additionally her history of going into asystole with her last episode of A. fib with RVR is worrisome. Her d-dimer is slightly elevated but it is stable compared to about 2 years ago. She is taking eliquis.and should be safe that way.        ____________________________________________   FINAL CLINICAL IMPRESSION(S) / ED DIAGNOSES  Final diagnoses:  Atrial fibrillation with RVR (HCC)  Dyspnea, unspecified type     ED Discharge Orders        Ordered    diltiazem (CARDIZEM CD) 120 MG 24 hr capsule  Daily     05/12/17 1643       Note:  This document was prepared using Dragon voice recognition software and may include unintentional dictation errors.    Nena Polio, MD 05/12/17 [phone]

## 2017-05-12 NOTE — ED Notes (Signed)
Attempted to call report. RN not available.

## 2017-05-12 NOTE — Discharge Instructions (Signed)
please take the diltiazem one pill a day. Please call your doctor on Monday and arrange follow-up later this week. Please return at once if you can more short of breath develop chest pain or any other worsening symptoms.

## 2017-05-12 NOTE — H&P (Signed)
[facility] [place] NAME: Norma Hill    MR#:  [id number]  DATE OF BIRTH:  05/22/1945  DATE OF ADMISSION:  05/12/2017  PRIMARY CARE PHYSICIAN: Guadalupe Maple, MD   REQUESTING/REFERRING PHYSICIAN: Nena Polio, MD  CHIEF COMPLAINT:   Chief Complaint  Patient presents with  . Shortness of Breath    HISTORY OF PRESENT ILLNESS: Norma Hill  is a 72 y.o. female with a known history of atrial fibrillation in the past who has had ablation, essential hypertension and history of breast cancer who is presenting with complaint of shortness of breath.  She reports that she has been short of breath for past 1 week especially with exertion progressively has gotten worse.  Patient in the ER was noted to have A. fib with RVR however heart rate has been fluctuating when she was ambulated a heart rate again went up high.  Patient had a chest x-ray which suggested a right-sided loculated pleural effusion which patient states that she has no idea about in the past.  Patient had a CT scan in 2017 which showed similar kind of finding.  She does complain of some lower extremity edema.       PAST MEDICAL HISTORY:   Past Medical History:  Diagnosis Date  . A-fib ([place])   . Bowel trouble 2012   ocasionally  . Cancer ([place]) 10/2000   pt had 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 Pt then had a repeat excision on 11/16/2000  . Hypertension 2006  . Personal history of malignant neoplasm of breast 2002  . Solitary cyst of breast 2012  . Special screening for malignant neoplasms, colon     PAST SURGICAL HISTORY:  Past Surgical History:  Procedure Laterality Date  . BREAST BIOPSY Right 2002  . BREAST SURGERY Right 2002   wide excision and sn x4 done 10/29/00 with repeat excision done on 11/16/2000 with post operative radiation treatment and 5 years of tamoxifen with completion in 2007  . CHOLECYSTECTOMY  2002  .  COLONOSCOPY  2008  . DILATION AND CURETTAGE OF UTERUS  1990,s  . ELECTROPHYSIOLOGIC STUDY N/A 03/16/2016   Procedure: CARDIOVERSION;  Surgeon: Isaias Cowman, MD;  Location: [facility] ORS;  Service: Cardiovascular;  Laterality: N/A;    SOCIAL HISTORY:  Social History   Tobacco Use  . Smoking status: Never Smoker  . Smokeless tobacco: Never Used  Substance Use Topics  . Alcohol use: No    FAMILY HISTORY:  Family History  Problem Relation Age of Onset  . Cancer Other        breast, relationship not listed    DRUG ALLERGIES: No Known Allergies  REVIEW OF SYSTEMS:   CONSTITUTIONAL: No fever, fatigue or weakness.  EYES: No blurred or double vision.  EARS, NOSE, AND THROAT: No tinnitus or ear pain.  RESPIRATORY: No cough, shortness of breath, wheezing or hemoptysis.  CARDIOVASCULAR: No chest pain, orthopnea, edema.  GASTROINTESTINAL: No nausea, vomiting, diarrhea or abdominal pain.  GENITOURINARY: No dysuria, hematuria.  ENDOCRINE: No polyuria, nocturia,  HEMATOLOGY: No anemia, easy bruising or bleeding SKIN: No rash or lesion. MUSCULOSKELETAL: No joint pain or arthritis.   NEUROLOGIC: No tingling, numbness, weakness.  PSYCHIATRY: No anxiety or depression.   MEDICATIONS AT HOME:  Prior to Admission medications   Medication Sig Start Date End Date Taking? Authorizing Provider  apixaban (ELIQUIS) 5 MG TABS tablet Take 1 tablet (5 mg total) by mouth  2 (two) times daily. 02/11/16  Yes Hower, Aaron Mose, MD  lisinopril (PRINIVIL,ZESTRIL) 10 MG tablet Take 10 mg by mouth daily. 04/27/17  Yes [provider]  Multiple Vitamin (MULTIVITAMIN WITH MINERALS) TABS tablet Take 1 tablet by mouth daily.   Yes [provider]  diltiazem (CARDIZEM CD) 120 MG 24 hr capsule Take 1 capsule (120 mg total) by mouth daily. 05/12/17 05/12/18  Nena Polio, MD      PHYSICAL EXAMINATION:   VITAL SIGNS: Blood pressure (!) 144/115, pulse (!) 120, temperature 97.6 F (36.4 C),  temperature source Oral, resp. rate (!) 26, weight 160 lb (72.6 kg), SpO2 93 %.  GENERAL:  72 y.o.-year-old patient lying in the bed with no acute distress.  EYES: Pupils equal, round, reactive to light and accommodation. No scleral icterus. Extraocular muscles intact.  HEENT: Head atraumatic, normocephalic. Oropharynx and nasopharynx clear.  NECK:  Supple, no jugular venous distention. No thyroid enlargement, no tenderness.  LUNGS: Normal breath sounds bilaterally, no wheezing, rales,rhonchi or crepitation. No use of accessory muscles of respiration.  CARDIOVASCULAR: Irregularly irregular. No murmurs, rubs, or gallops.  ABDOMEN: Soft, nontender, nondistended. Bowel sounds present. No organomegaly or mass.  EXTREMITIES: No pedal edema, cyanosis, or clubbing.  NEUROLOGIC: Cranial nerves II through XII are intact. Muscle strength 5/5 in all extremities. Sensation intact. Gait not checked.  PSYCHIATRIC: The patient is alert and oriented x 3.  SKIN: No obvious rash, lesion, or ulcer.   LABORATORY PANEL:   CBC Recent Labs  Lab 05/12/17 1344  WBC 7.9  HGB 14.2  HCT 42.6  PLT 215  MCV 91.1  MCH 30.3  MCHC 33.3  RDW 15.2*   ------------------------------------------------------------------------------------------------------------------  Chemistries  Recent Labs  Lab 05/12/17 1344  NA 138  K 3.3*  CL 104  CO2 25  GLUCOSE 142*  BUN 12  CREATININE 0.90  CALCIUM 9.3  MG 2.1   ------------------------------------------------------------------------------------------------------------------ CrCl cannot be calculated (Unknown ideal weight.). ------------------------------------------------------------------------------------------------------------------ No results for input(s): TSH, T4TOTAL, T3FREE, THYROIDAB in the last 72 hours.  Invalid input(s): FREET3   Coagulation profile No results for input(s): INR, PROTIME in the last 168  hours. ------------------------------------------------------------------------------------------------------------------- No results for input(s): DDIMER in the last 72 hours. -------------------------------------------------------------------------------------------------------------------  Cardiac Enzymes Recent Labs  Lab 05/12/17 1344  TROPONINI <0.03   ------------------------------------------------------------------------------------------------------------------ Invalid input(s): POCBNP  ---------------------------------------------------------------------------------------------------------------  Urinalysis No results found for: COLORURINE, APPEARANCEUR, LABSPEC, PHURINE, GLUCOSEU, HGBUR, BILIRUBINUR, KETONESUR, PROTEINUR, UROBILINOGEN, NITRITE, LEUKOCYTESUR   RADIOLOGY: Dg Chest Port 1 View  Result Date: 05/12/2017 CLINICAL DATA:  Shortness of breath and cough. History of breast cancer. EXAM: PORTABLE CHEST 1 VIEW COMPARISON:  02/09/2016; chest CT - 02/09/2016 FINDINGS: Grossly unchanged cardiac silhouette and mediastinal contours with persistent obscuration of the right heart border secondary to grossly unchanged moderate size right-sided effusion and associated right basilar heterogeneous/consolidative opacities. No new focal airspace opacities. Mild pulmonary venous congestion without frank evidence of edema. No pneumothorax. No acute osseus abnormalities. Surgical clips overlie the upper outer quadrant of the right breast. IMPRESSION: Grossly unchanged moderate-sized right-sided effusion with associated right basilar opacities, atelectasis versus infiltrate. Appearance of the chest is grossly unchanged compared to the 02/2016 examination though as there has been no interval comparisons, an acute on chronic process is not excluded. Electronically Signed   By: Sandi Mariscal M.D.   On: 05/12/2017 14:19    EKG: Orders placed or performed during the hospital encounter of 05/12/17   . EKG 12-Lead  . EKG 12-Lead  . ED EKG  within 10 minutes  . ED EKG within 10 minutes    IMPRESSION AND PLAN: Patient 72 year old white female presenting with shortness of breath  1.  A. fib with RVR patient is started on oral Cardizem Has received IV Cardizem dose once and IV digoxin We will place patient on telemetry Patient is already on Eliquis which we will continue Cardiology consult  2.  Shortness of breath due to large right-sided pleural effusion I will obtain a CT scan of the chest per PE protocol to further evaluate Patient may need to have thoracentesis Obtain echocardiogram of the heart to evaluate heart function further  3.  Essential hypertension Continue lisinopril  4.  Miscellaneous Eliquis for DVT prophylaxis   All the records are reviewed and case discussed with ED provider. Management plans discussed with the patient, family and they are in agreement.  CODE STATUS: Code Status History    Date Active Date Inactive Code Status Order ID Comments User Context   02/10/2016 00:52 02/11/2016 16:03 Full Code [id number]  Hugelmeyer, Ubaldo Glassing, DO Inpatient       TOTAL TIME TAKING CARE OF THIS PATIENT: 55 minutes.    Dustin Flock M.D on 05/12/2017 at 7:18 PM  Between 7am to 6pm - Pager - [phone]  After 6pm go to [email or web address] - password [email or web address] [facility]  [phone]  CC: Primary care physician; Guadalupe Maple, MD

## 2017-05-13 ENCOUNTER — Observation Stay (HOSPITAL_COMMUNITY)
Admit: 2017-05-13 | Discharge: 2017-05-13 | Disposition: A | Discharge status: Home / Self Care | Payer: Medicare Other | Attending: Internal Medicine | Admitting: Internal Medicine

## 2017-05-13 DIAGNOSIS — J90 Pleural effusion, not elsewhere classified: Secondary | ICD-10-CM | POA: Diagnosis present

## 2017-05-13 DIAGNOSIS — Z853 Personal history of malignant neoplasm of breast: Secondary | ICD-10-CM | POA: Diagnosis not present

## 2017-05-13 DIAGNOSIS — I340 Nonrheumatic mitral (valve) insufficiency: Secondary | ICD-10-CM | POA: Diagnosis not present

## 2017-05-13 DIAGNOSIS — I10 Essential (primary) hypertension: Secondary | ICD-10-CM | POA: Diagnosis present

## 2017-05-13 DIAGNOSIS — Z803 Family history of malignant neoplasm of breast: Secondary | ICD-10-CM | POA: Diagnosis not present

## 2017-05-13 DIAGNOSIS — R000 Tachycardia, unspecified: Secondary | ICD-10-CM | POA: Diagnosis present

## 2017-05-13 DIAGNOSIS — I481 Persistent atrial fibrillation: Secondary | ICD-10-CM | POA: Diagnosis present

## 2017-05-13 DIAGNOSIS — J9811 Atelectasis: Secondary | ICD-10-CM | POA: Diagnosis present

## 2017-05-13 DIAGNOSIS — J9601 Acute respiratory failure with hypoxia: Secondary | ICD-10-CM | POA: Diagnosis present

## 2017-05-13 DIAGNOSIS — Z923 Personal history of irradiation: Secondary | ICD-10-CM | POA: Diagnosis not present

## 2017-05-13 DIAGNOSIS — Z79899 Other long term (current) drug therapy: Secondary | ICD-10-CM | POA: Diagnosis not present

## 2017-05-13 DIAGNOSIS — R0602 Shortness of breath: Secondary | ICD-10-CM | POA: Diagnosis present

## 2017-05-13 DIAGNOSIS — Z7901 Long term (current) use of anticoagulants: Secondary | ICD-10-CM | POA: Diagnosis not present

## 2017-05-13 DIAGNOSIS — Z9049 Acquired absence of other specified parts of digestive tract: Secondary | ICD-10-CM | POA: Diagnosis not present

## 2017-05-13 LAB — CBC
HEMATOCRIT: 39.4 % (ref 35.0–47.0)
HEMOGLOBIN: 13.3 g/dL (ref 12.0–16.0)
MCH: 30.5 pg (ref 26.0–34.0)
MCHC: 33.9 g/dL (ref 32.0–36.0)
MCV: 90.2 fL (ref 80.0–100.0)
Platelets: 179 10*3/uL (ref 150–440)
RBC: 4.37 MIL/uL (ref 3.80–5.20)
RDW: 15.3 % — ABNORMAL HIGH (ref 11.5–14.5)
WBC: 6.9 10*3/uL (ref 3.6–11.0)

## 2017-05-13 LAB — BASIC METABOLIC PANEL
ANION GAP: 9 (ref 5–15)
BUN: 10 mg/dL (ref 6–20)
CHLORIDE: 110 mmol/L (ref 101–111)
CO2: 22 mmol/L (ref 22–32)
Calcium: 8.8 mg/dL — ABNORMAL LOW (ref 8.9–10.3)
Creatinine, Ser: 0.66 mg/dL (ref 0.44–1.00)
GFR calc Af Amer: >60 mL/min (ref >60)
GFR calc non Af Amer: >60 mL/min (ref >60)
GLUCOSE: 98 mg/dL (ref 65–99)
Potassium: 3.6 mmol/L (ref 3.5–5.1)
Sodium: 141 mmol/L (ref 135–145)

## 2017-05-13 LAB — ECHOCARDIOGRAM COMPLETE
Height: 64 in
WEIGHTICAEL: 2814.4 [oz_av]

## 2017-05-13 LAB — PROTIME-INR
INR: 1.47
PROTHROMBIN TIME: 17.7 s — AB (ref 11.4–15.2)

## 2017-05-13 LAB — HEPARIN LEVEL (UNFRACTIONATED)

## 2017-05-13 LAB — APTT: APTT: 36 s (ref 24–36)

## 2017-05-13 MED ORDER — HEPARIN (PORCINE) IN NACL 100-0.45 UNIT/ML-% IJ SOLN
1050.0000 [IU]/h | INTRAMUSCULAR | Status: DC
  Administered 2017-05-13: 1050 [IU]/h via INTRAVENOUS
  Filled 2017-05-13: qty 250

## 2017-05-13 NOTE — Progress Notes (Signed)
Pt arrived via stretcher from ER. Pt A&O with no complaints of pain at this time. Telemetry monitor applied and called to CCMD. Yellow socks placed, booklet given.

## 2017-05-13 NOTE — Plan of Care (Signed)
  Education: Knowledge of General Education information will improve 05/13/2017 0311 - Progressing by Jeri Cos, RN   Clinical Measurements: Respiratory complications will improve 05/13/2017 0311 - Progressing by Jeri Cos, RN   Activity: Risk for activity intolerance will decrease 05/13/2017 0311 - Progressing by Jeri Cos, RN   Activity: Ability to tolerate increased activity will improve 05/13/2017 0311 - Progressing by Jeri Cos, RN   Cardiac: Ability to achieve and maintain adequate cardiopulmonary perfusion will improve 05/13/2017 0311 - Progressing by Jeri Cos, RN

## 2017-05-13 NOTE — Progress Notes (Signed)
[facility] [place] NAME: Norma Hill    MR#:  [id number]  DATE OF BIRTH:  1945/08/23  SUBJECTIVE:  CHIEF COMPLAINT:   Chief Complaint  Patient presents with  . Shortness of Breath   Came which was of breath and tachycardia noted to have large right-sided pleural effusion. Heart rate is under monitoring on tele. REVIEW OF SYSTEMS:  CONSTITUTIONAL: No fever, fatigue or weakness.  EYES: No blurred or double vision.  EARS, NOSE, AND THROAT: No tinnitus or ear pain.  RESPIRATORY: No cough, have shortness of breath,no wheezing or hemoptysis.  CARDIOVASCULAR: No chest pain, orthopnea, edema.  GASTROINTESTINAL: No nausea, vomiting, diarrhea or abdominal pain.  GENITOURINARY: No dysuria, hematuria.  ENDOCRINE: No polyuria, nocturia,  HEMATOLOGY: No anemia, easy bruising or bleeding SKIN: No rash or lesion. MUSCULOSKELETAL: No joint pain or arthritis.   NEUROLOGIC: No tingling, numbness, weakness.  PSYCHIATRY: No anxiety or depression.   ROS  DRUG ALLERGIES:  No Known Allergies  VITALS:  Blood pressure 133/70, pulse 75, temperature 98.1 F (36.7 C), temperature source Oral, resp. rate 18, height 5\' 4"  (1.626 m), weight 79.8 kg (175 lb 14.4 oz), SpO2 93 %.  PHYSICAL EXAMINATION:  GENERAL:  72 y.o.-year-old patient lying in the bed with no acute distress.  EYES: Pupils equal, round, reactive to light and accommodation. No scleral icterus. Extraocular muscles intact.  HEENT: Head atraumatic, normocephalic. Oropharynx and nasopharynx clear.  NECK:  Supple, no jugular venous distention. No thyroid enlargement, no tenderness.  LUNGS: decreased breath sounds right side, no wheezing, rales,rhonchi or crepitation. No use of accessory muscles of respiration.  CARDIOVASCULAR: S1, S2 normal. No murmurs, rubs, or gallops.  ABDOMEN: Soft, nontender, nondistended. Bowel sounds present. No organomegaly or mass.  EXTREMITIES: No pedal edema, cyanosis,  or clubbing.  NEUROLOGIC: Cranial nerves II through XII are intact. Muscle strength 4-5/5 in all extremities. Sensation intact. Gait not checked.  PSYCHIATRIC: The patient is alert and oriented x 3.  SKIN: No obvious rash, lesion, or ulcer.   Physical Exam LABORATORY PANEL:   CBC Recent Labs  Lab 05/13/17 0523  WBC 6.9  HGB 13.3  HCT 39.4  PLT 179   ------------------------------------------------------------------------------------------------------------------  Chemistries  Recent Labs  Lab 05/12/17 1344 05/13/17 0523  NA 138 141  K 3.3* 3.6  CL 104 110  CO2 25 22  GLUCOSE 142* 98  BUN 12 10  CREATININE 0.90 0.66  CALCIUM 9.3 8.8*  MG 2.1  --    ------------------------------------------------------------------------------------------------------------------  Cardiac Enzymes Recent Labs  Lab 05/12/17 1344  TROPONINI <0.03   ------------------------------------------------------------------------------------------------------------------  RADIOLOGY:  Ct Angio Chest Pe W Or Wo Contrast  Result Date: 05/12/2017 CLINICAL DATA:  History of atrial fibrillation and shortness of Breath EXAM: CT ANGIOGRAPHY CHEST WITH CONTRAST TECHNIQUE: Multidetector CT imaging of the chest was performed using the standard protocol during bolus administration of intravenous contrast. Multiplanar CT image reconstructions and MIPs were obtained to evaluate the vascular anatomy. CONTRAST:  7mL ISOVUE-370 IOPAMIDOL (ISOVUE-370) INJECTION 76% COMPARISON:  Plain film from earlier in the same day as well as CT from 02/09/2016 FINDINGS: Cardiovascular: The thoracic aorta is incompletely opacified. Aortic calcifications are noted without aneurysmal dilatation. Mild cardiomegaly is seen. The pulmonary artery shows a normal branching pattern. No filling defect to suggest pulmonary embolism is noted. Mediastinum/Nodes: The esophagus is within normal limits. No significant hilar or mediastinal adenopathy  is seen. The thoracic inlet demonstrates the left lobe of the thyroid to be enlarged  but stable from the prior study. Lungs/Pleura: The medial aspect of the left lung apex there is a somewhat nodular appearing density identified which measures approximately 10 mm and is stable from the prior exam. Small left pleural effusion is noted. Mild left lower lobe atelectatic changes are seen. The right lung shows evidence of a large right pleural effusion increased in the interval from the prior exam. Some middle lobe consolidation is noted similar to that noted on the prior study. Mild changes in the right lower lobe are noted although the degree of aeration is improved when compared with the prior study. No definitive mass lesion is seen. Upper Abdomen: Visualized upper abdomen again demonstrates some mild nodularity to the liver stable from the previous exam. No other focal abnormality is seen. Musculoskeletal: Degenerative changes of the thoracic spine are noted. No other bony abnormality is seen. Review of the MIP images confirms the above findings. IMPRESSION: Large right-sided pleural effusion increased from the prior exam. The degree of consolidation within the right lower lobe has improved from the prior exam. Persistent consolidation involving the right middle lobe is seen. Small left pleural effusion.  Mild left basilar atelectasis is seen. Nodular appearing density in the medial aspect of the left apex. It is stable in appearance from the prior exam. Given the stability an aggressive process is felt to be less likely although follow-up examination in 3-6 months is recommended. No evidence of pulmonary emboli. Aortic Atherosclerosis (ICD10-I70.0). Electronically Signed   By: Inez Catalina M.D.   On: 05/12/2017 20:52   Dg Chest Port 1 View  Result Date: 05/12/2017 CLINICAL DATA:  Shortness of breath and cough. History of breast cancer. EXAM: PORTABLE CHEST 1 VIEW COMPARISON:  02/09/2016; chest CT - 02/09/2016  FINDINGS: Grossly unchanged cardiac silhouette and mediastinal contours with persistent obscuration of the right heart border secondary to grossly unchanged moderate size right-sided effusion and associated right basilar heterogeneous/consolidative opacities. No new focal airspace opacities. Mild pulmonary venous congestion without frank evidence of edema. No pneumothorax. No acute osseus abnormalities. Surgical clips overlie the upper outer quadrant of the right breast. IMPRESSION: Grossly unchanged moderate-sized right-sided effusion with associated right basilar opacities, atelectasis versus infiltrate. Appearance of the chest is grossly unchanged compared to the 02/2016 examination though as there has been no interval comparisons, an acute on chronic process is not excluded. Electronically Signed   By: Sandi Mariscal M.D.   On: 05/12/2017 14:19    ASSESSMENT AND PLAN:   Active Problems:   SOB (shortness of breath)  Patient 72 year old white female presenting with shortness of breath  1.  A. fib with RVR patient is started on oral Cardizem Has received IV Cardizem dose  and IV digoxin on telemetry Patient is already on Eliquis which we will continue Cardiology consult.  as she will need thoracentesis, I held Eliquis- started on heparin IV drip.  2.  Shortness of breath due to large right-sided pleural effusion  a CT scan of the chest per PE protocol to further evaluate- no PE. Patient may need to have thoracentesis Obtain echocardiogram of the heart to evaluate heart function further  hold anticoagulation, started on iV heparin and possibly thoracentesis tomorrow.  3.  Essential hypertension Continue lisinopril  4.  Miscellaneous Eliquis for DVT prophylaxis    All the records are reviewed and case discussed with Care Management/Social Workerr. Management plans discussed with the patient, family and they are in agreement.  CODE STATUS: Full.  TOTAL TIME TAKING CARE OF THIS  PATIENT: 35 minutes.   Discussed with her husband in the room.  POSSIBLE D/C IN 1-2 DAYS, DEPENDING ON CLINICAL CONDITION.   Vaughan Basta M.D on 05/13/2017   Between 7am to 6pm - Pager - [phone]  After 6pm go to [email or web address] - password [email or web address] [facility]  Office  [phone]  CC: Primary care physician; Guadalupe Maple, MD  Note: This dictation was prepared with Dragon dictation along with smaller phrase technology. Any transcriptional errors that result from this process are unintentional.

## 2017-05-13 NOTE — Progress Notes (Signed)
[facility] for heparin drip Indication: atrial fibrillation  No Known Allergies  Patient Measurements: Height: 5\' 4"  (162.6 cm) Weight: 175 lb 14.4 oz (79.8 kg) IBW/kg (Calculated) : 54.7 Heparin Dosing Weight: 71.8kg  Vital Signs: Temp: 98.1 F (36.7 C) (03/10 0812) Temp Source: Oral (03/10 0812) BP: 133/70 (03/10 0812) Pulse Rate: 75 (03/10 0812)  Labs: Recent Labs    05/12/17 1344 05/13/17 0523 05/13/17 1205  HGB 14.2 13.3  --   HCT 42.6 39.4  --   PLT 215 179  --   APTT  --   --  36  LABPROT  --   --  17.7*  INR  --   --  1.47  HEPARINUNFRC  --   --  >3.60*  CREATININE 0.90 0.66  --   TROPONINI <0.03  --   --     Estimated Creatinine Clearance: 64 mL/min (by C-G formula based on SCr of 0.66 mg/dL).   Medical History: Past Medical History:  Diagnosis Date  . A-fib ([place])   . Bowel trouble 2012   ocasionally  . 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 Pt then had a repeat excision on 11/16/2000  . Hypertension 2006  . Personal history of malignant neoplasm of breast 2002  . Solitary cyst of breast 2012  . Special screening for malignant neoplasms, colon     Medications:  Patient takes apixaban 5mg  BID at home. She received her last dose on 3/10 @ 0921.   Assessment: 72 yo female admitted with SOB found to have afib RVR and pleural effusions. Patient has history of Afib and is on apixaban at home. Per MD patient will potentially undergo surgery/thoracentesis tomorrow. Pharmacy has been consulted to transition patient from apixaban to heparin drip.    Goal of Therapy:  Heparin level 0.3-0.7 units/ml Monitor platelets by anticoagulation protocol: Yes   Plan:  Start heparin infusion at 1050 units/hr Check anti-Xa level in 8 hours and daily while on heparin Continue to monitor H&H and platelets  Heparin drip will be started 12 hrs after AM  apixaban dose.   Lendon Ka, PharmD Pharmacy Resident 05/13/2017,4:13 PM

## 2017-05-13 NOTE — Progress Notes (Signed)
[facility] for heparin drip Indication: atrial fibrillation  No Known Allergies  Patient Measurements: Height: 5\' 4"  (162.6 cm) Weight: 175 lb 14.4 oz (79.8 kg) IBW/kg (Calculated) : 54.7 Heparin Dosing Weight: 71.8kg  Vital Signs: Temp: 98.1 F (36.7 C) (03/10 0812) Temp Source: Oral (03/10 0812) BP: 133/70 (03/10 0812) Pulse Rate: 75 (03/10 0812)  Labs: Recent Labs    05/12/17 1344 05/13/17 0523  HGB 14.2 13.3  HCT 42.6 39.4  PLT 215 179  CREATININE 0.90 0.66  TROPONINI <0.03  --     Estimated Creatinine Clearance: 64 mL/min (by C-G formula based on SCr of 0.66 mg/dL).   Medical History: Past Medical History:  Diagnosis Date  . A-fib ([place])   . Bowel trouble 2012   ocasionally  . Cancer ([place]) 10/2000   pt had 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 Pt then had a repeat excision on 11/16/2000  . Hypertension 2006  . Personal history of malignant neoplasm of breast 2002  . Solitary cyst of breast 2012  . Special screening for malignant neoplasms, colon     Medications:  Patient takes apixaban 5mg  BID at home. She received her last dose on 3/10 @ 0921.   Assessment: 72 yo female admitted with SOB found to have afib RVR and pleural effusions. Patient has history of Afib and is on apixaban at home. Per MD patient will potentially undergo surgery/thoracentesis tomorrow. Pharmacy has been consulted to transition patient from apixaban to heparin drip.    Goal of Therapy:  Heparin level 0.3-0.7 units/ml Monitor platelets by anticoagulation protocol: Yes   Plan:  Start heparin infusion at 1050 units/hr Check anti-Xa level in 8 hours and daily while on heparin Continue to monitor H&H and platelets  Heparin drip will be started 12 hrs after AM apixaban dose.   Lendon Ka, PharmD Pharmacy Resident 05/13/2017,12:09 PM

## 2017-05-13 NOTE — Consult Note (Signed)
  Asked to see pt in consult  Chart reviewed   prviously cared for by DR AP and nursing notified

## 2017-05-13 NOTE — Care Management Obs Status (Signed)
[facility] NOTIFICATION   Patient Details  Name: Norma Hill MRN: [id number] Date of Birth: 1945/12/29   Medicare Observation Status Notification Given:  Yes    Vernette Moise A, RN 05/13/2017, 1:08 PM

## 2017-05-13 NOTE — Plan of Care (Signed)
  Progressing Clinical Measurements: Will remain free from infection 05/13/2017 1125 - Progressing by Liliane Channel, RN Respiratory complications will improve 05/13/2017 1125 - Progressing by Liliane Channel, RN Cardiovascular complication will be avoided 05/13/2017 1125 - Progressing by Liliane Channel, RN Pain Managment: General experience of comfort will improve 05/13/2017 1125 - Progressing by Liliane Channel, RN Safety: Ability to remain free from injury will improve 05/13/2017 1125 - Progressing by Liliane Channel, RN

## 2017-05-13 NOTE — Progress Notes (Signed)
Pt home medication in pharmacy

## 2017-05-14 ENCOUNTER — Inpatient Hospital Stay: Payer: Medicare Other

## 2017-05-14 DIAGNOSIS — R0602 Shortness of breath: Secondary | ICD-10-CM

## 2017-05-14 DIAGNOSIS — J90 Pleural effusion, not elsewhere classified: Principal | ICD-10-CM

## 2017-05-14 LAB — CBC
HEMATOCRIT: 40.2 % (ref 35.0–47.0)
HEMOGLOBIN: 13.3 g/dL (ref 12.0–16.0)
MCH: 30.2 pg (ref 26.0–34.0)
MCHC: 33.1 g/dL (ref 32.0–36.0)
MCV: 91 fL (ref 80.0–100.0)
Platelets: 176 10*3/uL (ref 150–440)
RBC: 4.42 MIL/uL (ref 3.80–5.20)
RDW: 15.2 % — ABNORMAL HIGH (ref 11.5–14.5)
WBC: 6.6 10*3/uL (ref 3.6–11.0)

## 2017-05-14 LAB — BODY FLUID CELL COUNT WITH DIFFERENTIAL
Eos, Fluid: 0 %
Lymphs, Fluid: 93 %
Monocyte-Macrophage-Serous Fluid: 5 %
Neutrophil Count, Fluid: 2 %
Other Cells, Fluid: 0 %
WBC FLUID: 614 uL

## 2017-05-14 LAB — HEPARIN LEVEL (UNFRACTIONATED): HEPARIN UNFRACTIONATED: 1.69 [IU]/mL — AB (ref 0.30–0.70)

## 2017-05-14 LAB — PROTEIN, PLEURAL OR PERITONEAL FLUID: Total protein, fluid: <3 g/dL

## 2017-05-14 LAB — ALBUMIN, PLEURAL OR PERITONEAL FLUID: ALBUMIN FL: 1.2 g/dL

## 2017-05-14 LAB — GLUCOSE, PLEURAL OR PERITONEAL FLUID: Glucose, Fluid: 118 mg/dL

## 2017-05-14 LAB — APTT: aPTT: 89 seconds — ABNORMAL HIGH (ref 24–36)

## 2017-05-14 LAB — LACTATE DEHYDROGENASE, PLEURAL OR PERITONEAL FLUID: LD FL: 48 U/L — AB (ref 3–23)

## 2017-05-14 MED ORDER — APIXABAN 5 MG PO TABS
5.0000 mg | ORAL_TABLET | Freq: Two times a day (BID) | ORAL | Status: DC
  Administered 2017-05-14: 5 mg via ORAL
  Filled 2017-05-14 (×2): qty 1

## 2017-05-14 MED ORDER — DIPHENHYDRAMINE HCL 25 MG PO CAPS
25.0000 mg | ORAL_CAPSULE | Freq: Every evening | ORAL | Status: DC | PRN
  Administered 2017-05-14 – 2017-05-15 (×2): 25 mg via ORAL
  Filled 2017-05-14 (×2): qty 1

## 2017-05-14 NOTE — Progress Notes (Signed)
[facility] [place] NAME: Norma Hill    MR#:  [id number]  DATE OF BIRTH:  04/17/1945  SUBJECTIVE:  CHIEF COMPLAINT:   Chief Complaint  Patient presents with  . Shortness of Breath   Came which was of breath and tachycardia noted to have large right-sided pleural effusion. Heart rate is under monitoring on tele. Today had Thorocentesis, feels better.  REVIEW OF SYSTEMS:  CONSTITUTIONAL: No fever, fatigue or weakness.  EYES: No blurred or double vision.  EARS, NOSE, AND THROAT: No tinnitus or ear pain.  RESPIRATORY: No cough, have shortness of breath,no wheezing or hemoptysis.  CARDIOVASCULAR: No chest pain, orthopnea, edema.  GASTROINTESTINAL: No nausea, vomiting, diarrhea or abdominal pain.  GENITOURINARY: No dysuria, hematuria.  ENDOCRINE: No polyuria, nocturia,  HEMATOLOGY: No anemia, easy bruising or bleeding SKIN: No rash or lesion. MUSCULOSKELETAL: No joint pain or arthritis.   NEUROLOGIC: No tingling, numbness, weakness.  PSYCHIATRY: No anxiety or depression.   ROS  DRUG ALLERGIES:  No Known Allergies  VITALS:  Blood pressure 132/80, pulse 81, temperature 98 F (36.7 C), temperature source Oral, resp. rate 18, height 5\' 4"  (1.626 m), weight 79.8 kg (175 lb 14.4 oz), SpO2 95 %.  PHYSICAL EXAMINATION:  GENERAL:  72 y.o.-year-old patient lying in the bed with no acute distress.  EYES: Pupils equal, round, reactive to light and accommodation. No scleral icterus. Extraocular muscles intact.  HEENT: Head atraumatic, normocephalic. Oropharynx and nasopharynx clear.  NECK:  Supple, no jugular venous distention. No thyroid enlargement, no tenderness.  LUNGS: decreased breath sounds right side, no wheezing, rales,rhonchi or crepitation. No use of accessory muscles of respiration.  CARDIOVASCULAR: S1, S2 normal. No murmurs, rubs, or gallops.  ABDOMEN: Soft, nontender, nondistended. Bowel sounds present. No organomegaly or mass.   EXTREMITIES: No pedal edema, cyanosis, or clubbing.  NEUROLOGIC: Cranial nerves II through XII are intact. Muscle strength 4-5/5 in all extremities. Sensation intact. Gait not checked.  PSYCHIATRIC: The patient is alert and oriented x 3.  SKIN: No obvious rash, lesion, or ulcer.   Physical Exam LABORATORY PANEL:   CBC Recent Labs  Lab 05/14/17 0607  WBC 6.6  HGB 13.3  HCT 40.2  PLT 176   ------------------------------------------------------------------------------------------------------------------  Chemistries  Recent Labs  Lab 05/12/17 1344 05/13/17 0523  NA 138 141  K 3.3* 3.6  CL 104 110  CO2 25 22  GLUCOSE 142* 98  BUN 12 10  CREATININE 0.90 0.66  CALCIUM 9.3 8.8*  MG 2.1  --    ------------------------------------------------------------------------------------------------------------------  Cardiac Enzymes Recent Labs  Lab 05/12/17 1344  TROPONINI <0.03   ------------------------------------------------------------------------------------------------------------------  RADIOLOGY:  Dg Chest Port 1 View  Result Date: 05/14/2017 CLINICAL DATA:  Status post right thoracentesis today. EXAM: PORTABLE CHEST 1 VIEW COMPARISON:  CT chest and single view of the chest 05/12/2017. FINDINGS: Right pleural effusion is markedly decreased after thoracentesis. No pneumothorax. Small left pleural effusion is again seen. There is some bibasilar airspace disease, worse on the right. Marked cardiomegaly is noted. Surgical clips are seen in the right axilla. No acute bony abnormality. IMPRESSION: Marked decrease in right pleural effusion after thoracentesis. Negative for pneumothorax. Small left pleural effusion and bibasilar airspace disease. Cardiomegaly without edema. Electronically Signed   By: Inge Rise M.D.   On: 05/14/2017 11:04   US Thoracentesis Asp Pleural Space W/img Guide  Result Date: 05/14/2017 INDICATION: 72 year old female with a history of right-sided  pleural effusion EXAM: ULTRASOUND GUIDED RIGHT THORACENTESIS MEDICATIONS:  None. COMPLICATIONS: None PROCEDURE: An ultrasound guided thoracentesis was thoroughly discussed with the patient and questions answered. The benefits, risks, alternatives and complications were also discussed. The patient understands and wishes to proceed with the procedure. Written consent was obtained. Ultrasound was performed to localize and mark an adequate pocket of fluid in the right chest. The area was then prepped and draped in the normal sterile fashion. 1% Lidocaine was used for local anesthesia. Under ultrasound guidance a 8 Fr Safe-T-Centesis catheter was introduced. Thoracentesis was performed. The catheter was removed and a dressing applied. FINDINGS: A total of approximately 1200 cc of thin yellow fluid was removed. Samples were sent to the laboratory as requested by the clinical team. IMPRESSION: Status post right-sided thoracentesis. Signed, Dulcy Fanny. Earleen Newport, DO [facility] Radiology Electronically Signed   By: Corrie Mckusick D.O.   On: 05/14/2017 10:57    ASSESSMENT AND PLAN:   Active Problems:   SOB (shortness of breath)   Pleural effusion  Patient 72 year old white female presenting with shortness of breath  1.  A. fib with RVR patient is started on oral Cardizem Has received IV Cardizem dose  and IV digoxin one dose on telemetry Patient is already on Eliquis which we will continue after procedure. Cardiology consult appreciated.  for thorocentesis- eliquis was held, was on heparine drip- now resume eliquis.  2.  Shortness of breath due to large right-sided pleural effusion  a CT scan of the chest per PE protocol to further evaluate- no PE. Obtain echocardiogram of the heart to evaluate heart function further  hold anticoagulation, started on iV heparin for thorocentesis.  Pulm consult for Hypoxia and pleural effusion.  3.  Essential  hypertension Continue lisinopril  4.  Miscellaneous Eliquis for DVT prophylaxis   All the records are reviewed and case discussed with Care Management/Social Workerr. Management plans discussed with the patient, family and they are in agreement.  CODE STATUS: Full.  TOTAL TIME TAKING CARE OF THIS PATIENT: 35 minutes.   Discussed with her husband in the room.  POSSIBLE D/C IN 1-2 DAYS, DEPENDING ON CLINICAL CONDITION.   Vaughan Basta M.D on 05/14/2017   Between 7am to 6pm - Pager - [phone]  After 6pm go to [email or web address] - password [email or web address] [facility]  Office  [phone]  CC: Primary care physician; Guadalupe Maple, MD  Note: This dictation was prepared with Dragon dictation along with smaller phrase technology. Any transcriptional errors that result from this process are unintentional.

## 2017-05-14 NOTE — Progress Notes (Signed)
[facility] for heparin drip Indication: atrial fibrillation  No Known Allergies  Patient Measurements: Height: 5\' 4"  (162.6 cm) Weight: 175 lb 14.4 oz (79.8 kg) IBW/kg (Calculated) : 54.7 Heparin Dosing Weight: 71.8kg  Vital Signs: Temp: 97.7 F (36.5 C) (03/11 0718) Temp Source: Oral (03/11 0718) BP: 155/83 (03/11 0718) Pulse Rate: 109 (03/11 0718)  Labs: Recent Labs    05/12/17 1344 05/13/17 0523 05/13/17 1205 05/14/17 0607  HGB 14.2 13.3  --  13.3  HCT 42.6 39.4  --  40.2  PLT 215 179  --  176  APTT  --   --  36 89*  LABPROT  --   --  17.7*  --   INR  --   --  1.47  --   HEPARINUNFRC  --   --  >3.60*  --   CREATININE 0.90 0.66  --   --   TROPONINI <0.03  --   --   --     Estimated Creatinine Clearance: 64 mL/min (by C-G formula based on SCr of 0.66 mg/dL).   Medical History: Past Medical History:  Diagnosis Date  . A-fib ([place])   . Bowel trouble 2012   ocasionally  . 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 Pt then had a repeat excision on 11/16/2000  . Hypertension 2006  . Personal history of malignant neoplasm of breast 2002  . Solitary cyst of breast 2012  . Special screening for malignant neoplasms, colon     Medications:  Patient takes apixaban 5mg  BID at home. She received her last dose on 3/10 @ 0921.   Assessment: 72 yo female admitted with SOB found to have afib RVR and pleural effusions. Patient has history of Afib and is on apixaban at home. Per MD patient will potentially undergo surgery/thoracentesis tomorrow. Pharmacy has been consulted to transition patient from apixaban to heparin drip.    Goal of Therapy:  Heparin level 0.3-0.7 units/ml Monitor platelets by anticoagulation protocol: Yes   Plan:  Start heparin infusion at 1050 units/hr Check anti-Xa level in 8 hours and daily while on heparin Continue to monitor H&H and  platelets  Heparin drip will be started 12 hrs after AM apixaban dose.   3/11 0607 aPTT therapeutic x 1. Continue current rate. Will recheck HL and aPTT in 6 hours.  Norma Hill, [place].D., BCPS Clinical Pharmacist 05/14/2017,7:22 AM

## 2017-05-14 NOTE — Progress Notes (Signed)
Ultrasound department called talked to [facility] and states that its okay to go ahead and take pt for thoracentesis now. Waiting for them to come pick the pt.

## 2017-05-14 NOTE — Progress Notes (Signed)
Pt will be going for thoracentesis this afternoon. Pt need to be stopped on heprin drip 4 hours prior to procedure. Called doctor Stickney and he ordered to stopped the heparin drip. Will continue to monitor.

## 2017-05-14 NOTE — Consult Note (Signed)
[facility] Consultation      Assessment and Plan:  Acute hypoxic respiratory failure with moderate to large right-sided pleural effusion. Right middle lobe atelectasis, etiology uncertain, possible pneumonia versus atelectasis versus lung lesion, radiation fibrosis.  Pleural thickening/fibrosis, likely due to previous radiation.   -Await results of thoracentesis. --Wean down/off oxygen.  - Will need close outpatient follow-up with repeat chest x-ray in 1-2 weeks.  If the right middle lobe atelectasis persist the patient may require bronchoscopy to better evaluate.  My office will call the patient to arrange follow-up.  If her breathing remains at baseline, she is otherwise stable for discharge from a respiratory standpoint.   Date: 05/14/2017  MRN# [id number] Norma Hill Jul 09, 1945  Referring Physician: Dr. Anselm Jungling.   Norma Hill is a 72 y.o. old female seen in consultation for chief complaint of:    Chief Complaint  Patient presents with  . Shortness of Breath    HPI:  Patient is a 72 year old female with a history of atrial fibrillation, history of cardiac ablation for tachybradycardia syndrome, she presented to the emergency room on 05/12/17 for dyspnea.  She has a history of breast cancer, she was admitted to the hospital on 02/09/16 with chest discomfort, at that time she had a small to moderate loculated right pleural effusion.   Patient tells me that she was diagnosed with right-sided breast cancer in 2017, and underwent what sounds like lumpectomy at [facility], she tells me that she did not require radiation or chemotherapy however her recall of this appears to be poor, I do not see any records from 2017 for said procedure; per the electronic medical record patient had a breast excision on 10/29/00 with repeat on 11/16/00 with postoperative radiation and 5 years of tamoxifen completed in 2007.  She is a lifelong non-smoker, she reports no secondhand  smoke exposures.  She underwent thoracentesis today with removal of approximately 1200 cc of amber fluid, no comp occasions were noted.  The patient states that she is feeling significantly better in terms of her breathing since the drainage, nearly back to her baseline.  Imaging personally reviewed CT chest 05/12/17; moderate right-sided pleural effusion, compressive atelectasis; RML atelectasis, possible pneumonia. Bi-atrial dilatation, pulmonary arterial dilation c/w volume overload with pulmonary hypertension.  On previous CT imaging from 02/09/16, there was a small loculated right pleural effusion with early changes of right middle lobe atelectasis.  PMHX:   Past Medical History:  Diagnosis Date  . A-fib ([place])   . Bowel trouble 2012   ocasionally  . Cancer ([place]) 10/2000   pt had 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 Pt then had a repeat excision on 11/16/2000  . Hypertension 2006  . Personal history of malignant neoplasm of breast 2002  . Solitary cyst of breast 2012  . Special screening for malignant neoplasms, colon    Surgical Hx:  Past Surgical History:  Procedure Laterality Date  . BREAST BIOPSY Right 2002  . BREAST SURGERY Right 2002   wide excision and sn x4 done 10/29/00 with repeat excision done on 11/16/2000 with post operative radiation treatment and 5 years of tamoxifen with completion in 2007  . CHOLECYSTECTOMY  2002  . COLONOSCOPY  2008  . DILATION AND CURETTAGE OF UTERUS  1990,s  . ELECTROPHYSIOLOGIC STUDY N/A 03/16/2016   Procedure: CARDIOVERSION;  Surgeon: Isaias Cowman, MD;  Location: [facility] ORS;  Service: Cardiovascular;  Laterality: N/A;   Family Hx:  Family History  Problem Relation Age of Onset  . Cancer Other        breast, relationship not listed   Social Hx:   Social History   Tobacco Use  . Smoking status: Never Smoker  . Smokeless tobacco: Never Used  Substance Use Topics  . Alcohol use: No  .  Drug use: No   Medication:    Current Facility-Administered Medications:  .  acetaminophen (TYLENOL) tablet 650 mg, 650 mg, Oral, Q6H PRN **OR** acetaminophen (TYLENOL) suppository 650 mg, 650 mg, Rectal, Q6H PRN, Dustin Flock, MD .  digoxin (LANOXIN) 0.25 MG/ML injection 0.25 mg, 0.25 mg, Intravenous, Once, Dustin Flock, MD .  diltiazem (CARDIZEM) tablet 60 mg, 60 mg, Oral, Q8H, Dustin Flock, MD, 60 mg at 05/14/17 0529 .  heparin ADULT infusion 100 units/mL (25000 units/291mL sodium chloride 0.45%), 1,050 Units/hr, Intravenous, Continuous, Lifsey, Betti Cruz, RPH, Stopped at 05/14/17 0850 .  lisinopril (PRINIVIL,ZESTRIL) tablet 10 mg, 10 mg, Oral, Daily, Dustin Flock, MD, 10 mg at 05/14/17 0840 .  multivitamin with minerals tablet 1 tablet, 1 tablet, Oral, Daily, Dustin Flock, MD, 1 tablet at 05/14/17 0840   Allergies:  Patient has no known allergies.  Review of Systems: Gen:  Denies  fever, sweats, chills HEENT: Denies blurred vision, double vision. bleeds, sore throat Cvc:  No dizziness, chest pain. Resp:   Denies cough or sputum production, shortness of breath Gi: Denies swallowing difficulty, stomach pain. Gu:  Denies bladder incontinence, burning urine Ext:   No Joint pain, stiffness. Skin: No skin rash,  hives  Endoc:  No polyuria, polydipsia. Psych: No depression, insomnia. Other:  All other systems were reviewed with the patient and were negative other that what is mentioned in the HPI.   Physical Examination:   VS: BP (!) 155/83 (BP Location: Left Arm)   Pulse (!) 109   Temp 97.7 F (36.5 C) (Oral)   Resp 18   Ht 5\' 4"  (1.626 m)   Wt 175 lb 14.4 oz (79.8 kg)   SpO2 93%   BMI 30.19 kg/m   General Appearance: No distress  Neuro:without focal findings,  speech normal,  HEENT: PERRLA, EOM intact.   Pulmonary: normal breath sounds, No wheezing.  CardiovascularNormal S1,S2.  No m/r/g.   Abdomen: Benign, Soft, non-tender. Renal:  No costovertebral  tenderness  GU:  No performed at this time. Endoc: No evident thyromegaly, no signs of acromegaly. Skin:   warm, no rashes, no ecchymosis  Extremities: normal, no cyanosis, clubbing.  Other findings:    LABORATORY PANEL:   CBC Recent Labs  Lab 05/14/17 0607  WBC 6.6  HGB 13.3  HCT 40.2  PLT 176   ------------------------------------------------------------------------------------------------------------------  Chemistries  Recent Labs  Lab 05/12/17 1344 05/13/17 0523  NA 138 141  K 3.3* 3.6  CL 104 110  CO2 25 22  GLUCOSE 142* 98  BUN 12 10  CREATININE 0.90 0.66  CALCIUM 9.3 8.8*  MG 2.1  --    ------------------------------------------------------------------------------------------------------------------  Cardiac Enzymes Recent Labs  Lab 05/12/17 1344  TROPONINI <0.03   ------------------------------------------------------------  RADIOLOGY:  Ct Angio Chest Pe W Or Wo Contrast  Result Date: 05/12/2017 CLINICAL DATA:  History of atrial fibrillation and shortness of Breath EXAM: CT ANGIOGRAPHY CHEST WITH CONTRAST TECHNIQUE: Multidetector CT imaging of the chest was performed using the standard protocol during bolus administration of intravenous contrast. Multiplanar CT image reconstructions and MIPs were obtained to evaluate the vascular anatomy. CONTRAST:  31mL ISOVUE-370 IOPAMIDOL (ISOVUE-370) INJECTION 76% COMPARISON:  Plain  film from earlier in the same day as well as CT from 02/09/2016 FINDINGS: Cardiovascular: The thoracic aorta is incompletely opacified. Aortic calcifications are noted without aneurysmal dilatation. Mild cardiomegaly is seen. The pulmonary artery shows a normal branching pattern. No filling defect to suggest pulmonary embolism is noted. Mediastinum/Nodes: The esophagus is within normal limits. No significant hilar or mediastinal adenopathy is seen. The thoracic inlet demonstrates the left lobe of the thyroid to be enlarged but stable from the  prior study. Lungs/Pleura: The medial aspect of the left lung apex there is a somewhat nodular appearing density identified which measures approximately 10 mm and is stable from the prior exam. Small left pleural effusion is noted. Mild left lower lobe atelectatic changes are seen. The right lung shows evidence of a large right pleural effusion increased in the interval from the prior exam. Some middle lobe consolidation is noted similar to that noted on the prior study. Mild changes in the right lower lobe are noted although the degree of aeration is improved when compared with the prior study. No definitive mass lesion is seen. Upper Abdomen: Visualized upper abdomen again demonstrates some mild nodularity to the liver stable from the previous exam. No other focal abnormality is seen. Musculoskeletal: Degenerative changes of the thoracic spine are noted. No other bony abnormality is seen. Review of the MIP images confirms the above findings. IMPRESSION: Large right-sided pleural effusion increased from the prior exam. The degree of consolidation within the right lower lobe has improved from the prior exam. Persistent consolidation involving the right middle lobe is seen. Small left pleural effusion.  Mild left basilar atelectasis is seen. Nodular appearing density in the medial aspect of the left apex. It is stable in appearance from the prior exam. Given the stability an aggressive process is felt to be less likely although follow-up examination in 3-6 months is recommended. No evidence of pulmonary emboli. Aortic Atherosclerosis (ICD10-I70.0). Electronically Signed   By: Inez Catalina M.D.   On: 05/12/2017 20:52   Dg Chest Port 1 View  Result Date: 05/12/2017 CLINICAL DATA:  Shortness of breath and cough. History of breast cancer. EXAM: PORTABLE CHEST 1 VIEW COMPARISON:  02/09/2016; chest CT - 02/09/2016 FINDINGS: Grossly unchanged cardiac silhouette and mediastinal contours with persistent obscuration of the  right heart border secondary to grossly unchanged moderate size right-sided effusion and associated right basilar heterogeneous/consolidative opacities. No new focal airspace opacities. Mild pulmonary venous congestion without frank evidence of edema. No pneumothorax. No acute osseus abnormalities. Surgical clips overlie the upper outer quadrant of the right breast. IMPRESSION: Grossly unchanged moderate-sized right-sided effusion with associated right basilar opacities, atelectasis versus infiltrate. Appearance of the chest is grossly unchanged compared to the 02/2016 examination though as there has been no interval comparisons, an acute on chronic process is not excluded. Electronically Signed   By: Sandi Mariscal M.D.   On: 05/12/2017 14:19       Thank  you for the consultation and for allowing [facility] to assist in the care of your patient. Our recommendations are noted above.  Please contact us if we can be of further service.   Marda Stalker, MD.  Board Certified in Internal Medicine, Pulmonary Medicine, [place], and Sleep Medicine.  [facility] Office Number: [phone]  Patricia Pesa, M.D.  Merton Border, M.D  05/14/2017

## 2017-05-14 NOTE — Procedures (Signed)
Interventional Radiology Procedure Note  Procedure: US guided right thoracentesis.  ~1200cc of thin yellow fluid aspirated.  Complications: None Recommendations:  - CXR now - Do not submerge for 7 days - Routine care - OK to restart heparin   Signed,  Dulcy Fanny. Earleen Newport, DO

## 2017-05-14 NOTE — Progress Notes (Signed)
Post IR Procedure Consult Anticoagulant/Antiplatelet PTA/Inpatient Med List Review by Pharmacist consult noted. Spoke with Dr. Anselm Jungling - okay to resume PTA Eliquis and d/c heparin consult. Heparin order and consult discontinued and Eliquis 5 mg po BID started.  Kailah Pennel A. Ruffin, [place].D., BCPS Clinical Pharmacist 05/14/17 14:35

## 2017-05-14 NOTE — Care Management (Signed)
Patient had right thoracentesis with removal of 1.2 liters of fluid. Current supplemental oxygen is acute and attempts are being made to wean oxygen.  Chronic Eliquis

## 2017-05-14 NOTE — Consult Note (Addendum)
Cardiology Consultation Note    Patient ID: Norma [facility], MRN: [id number], DOB/AGE: 05/08/1945 72 y.o. Admit date: 05/12/2017   Date of Consult: 05/14/2017 Primary Physician: Guadalupe Maple, MD Primary Cardiologist: Dr. Saralyn Pilar  Chief Complaint: afib Reason for Consultation: afib Requesting MD: Dr. Anselm Jungling  HPI: Norma Hill is a 72 y.o. female with history of persistent atrial fibrillation who is status post cryo-balloon ablation at [facility] approximately 1 year ago.  She is now admitted with recurrent atrial fibrillation with rapid ventricular response and has been noted to have a right pleural effusion.  She has a history of breast carcinoma.  She also has a history of hypertension.    Patient has a preserved LV function with an EF of 55-65% by echo in the past.  She also has been treated for hypertension.  She was noted to have increasing shortness of breath presented to the emergency room.  In the ER she was noted to have A. fib with rapid ventricular response.  Chest x-ray revealed a right-sided loculated pleural effusion which the patient reportedly was unaware of.  CT scan in 2017 also showed a right pleural effusion.  She also had lower extremity edema.  On admission she was treated as an outpatient with apixaban 5 mg twice daily, lisinopril 10 mg daily and diltiazem 120.  Admission electrocardiogram showed atrial fibrillation with rapid ventricular response.  Serum troponin drawn as part of protocol was normal.  Serum potassium was 3.3 improved to 3.6.  Renal function was normal.  He was placed on heparin.  And taken off of apixaban for possible thoracentesis.  Echocardiogram revealed preserved LV function.  There is moderate left atrial enlargement mild right atrial enlargement with moderate MR.  Past Medical History:  Diagnosis Date  . A-fib ([place])   . Bowel trouble 2012   ocasionally  . Cancer ([place]) 10/2000   pt had wide excision right breast for insitu lobular  carcinoma and invasive lobular carcinoma. Pt then had a repeat excision on 11/16/2000  . Hypertension 2006  . Personal history of malignant neoplasm of breast 2002  . Solitary cyst of breast 2012  . Special screening for malignant neoplasms, colon       Surgical History:  Past Surgical History:  Procedure Laterality Date  . BREAST BIOPSY Right 2002  . BREAST SURGERY Right 2002   wide excision and sn x4 done 10/29/00 with repeat excision done on 11/16/2000 with post operative radiation treatment and 5 years of tamoxifen with completion in 2007  . CHOLECYSTECTOMY  2002  . COLONOSCOPY  2008  . DILATION AND CURETTAGE OF UTERUS  1990,s  . ELECTROPHYSIOLOGIC STUDY N/A 03/16/2016   Procedure: CARDIOVERSION;  Surgeon: Isaias Cowman, MD;  Location: [facility] ORS;  Service: Cardiovascular;  Laterality: N/A;     Home Meds: Prior to Admission medications   Medication Sig Start Date End Date Taking? Authorizing Provider  apixaban (ELIQUIS) 5 MG TABS tablet Take 1 tablet (5 mg total) by mouth 2 (two) times daily. 02/11/16  Yes Hower, Aaron Mose, MD  lisinopril (PRINIVIL,ZESTRIL) 10 MG tablet Take 10 mg by mouth daily. 04/27/17  Yes [provider]  Multiple Vitamin (MULTIVITAMIN WITH MINERALS) TABS tablet Take 1 tablet by mouth daily.   Yes [provider]  diltiazem (CARDIZEM CD) 120 MG 24 hr capsule Take 1 capsule (120 mg total) by mouth daily. 05/12/17 05/12/18  Nena Polio, MD    Inpatient Medications:  . digoxin  0.25  mg Intravenous Once  . diltiazem  60 mg Oral Q8H  . lisinopril  10 mg Oral Daily  . multivitamin with minerals  1 tablet Oral Daily   . heparin 1,050 Units/hr (05/13/17 2207)    Allergies: No Known Allergies  Social History   Socioeconomic History  . Marital status: Married    Spouse name: Not on file  . Number of children: Not on file  . Years of education: Not on file  . Highest education level: Not on file  Social Needs  . Financial resource strain:  Not on file  . Food insecurity - worry: Not on file  . Food insecurity - inability: Not on file  . Transportation needs - medical: Not on file  . Transportation needs - non-medical: Not on file  Occupational History  . Not on file  Tobacco Use  . Smoking status: Never Smoker  . Smokeless tobacco: Never Used  Substance and Sexual Activity  . Alcohol use: No  . Drug use: No  . Sexual activity: Not on file  Other Topics Concern  . Not on file  Social History Narrative  . Not on file     Family History  Problem Relation Age of Onset  . Cancer Other        breast, relationship not listed     Review of Systems: A 12-system review of systems was performed and is negative except as noted in the HPI.  Labs: Recent Labs    05/12/17 1344  TROPONINI <0.03   Lab Results  Component Value Date   WBC 6.6 05/14/2017   HGB 13.3 05/14/2017   HCT 40.2 05/14/2017   MCV 91.0 05/14/2017   PLT 176 05/14/2017    Recent Labs  Lab 05/13/17 0523  NA 141  K 3.6  CL 110  CO2 22  BUN 10  CREATININE 0.66  CALCIUM 8.8*  GLUCOSE 98   Lab Results  Component Value Date   CHOL 169 02/10/2016   HDL 59 02/10/2016   LDLCALC 100 (H) 02/10/2016   TRIG 50 02/10/2016   No results found for: DDIMER  Radiology/Studies:  Ct Angio Chest Pe W Or Wo Contrast  Result Date: 05/12/2017 CLINICAL DATA:  History of atrial fibrillation and shortness of Breath EXAM: CT ANGIOGRAPHY CHEST WITH CONTRAST TECHNIQUE: Multidetector CT imaging of the chest was performed using the standard protocol during bolus administration of intravenous contrast. Multiplanar CT image reconstructions and MIPs were obtained to evaluate the vascular anatomy. CONTRAST:  56mL ISOVUE-370 IOPAMIDOL (ISOVUE-370) INJECTION 76% COMPARISON:  Plain film from earlier in the same day as well as CT from 02/09/2016 FINDINGS: Cardiovascular: The thoracic aorta is incompletely opacified. Aortic calcifications are noted without aneurysmal  dilatation. Mild cardiomegaly is seen. The pulmonary artery shows a normal branching pattern. No filling defect to suggest pulmonary embolism is noted. Mediastinum/Nodes: The esophagus is within normal limits. No significant hilar or mediastinal adenopathy is seen. The thoracic inlet demonstrates the left lobe of the thyroid to be enlarged but stable from the prior study. Lungs/Pleura: The medial aspect of the left lung apex there is a somewhat nodular appearing density identified which measures approximately 10 mm and is stable from the prior exam. Small left pleural effusion is noted. Mild left lower lobe atelectatic changes are seen. The right lung shows evidence of a large right pleural effusion increased in the interval from the prior exam. Some middle lobe consolidation is noted similar to that noted on the prior study. Mild changes  in the right lower lobe are noted although the degree of aeration is improved when compared with the prior study. No definitive mass lesion is seen. Upper Abdomen: Visualized upper abdomen again demonstrates some mild nodularity to the liver stable from the previous exam. No other focal abnormality is seen. Musculoskeletal: Degenerative changes of the thoracic spine are noted. No other bony abnormality is seen. Review of the MIP images confirms the above findings. IMPRESSION: Large right-sided pleural effusion increased from the prior exam. The degree of consolidation within the right lower lobe has improved from the prior exam. Persistent consolidation involving the right middle lobe is seen. Small left pleural effusion.  Mild left basilar atelectasis is seen. Nodular appearing density in the medial aspect of the left apex. It is stable in appearance from the prior exam. Given the stability an aggressive process is felt to be less likely although follow-up examination in 3-6 months is recommended. No evidence of pulmonary emboli. Aortic Atherosclerosis (ICD10-I70.0).  Electronically Signed   By: Inez Catalina M.D.   On: 05/12/2017 20:52   Dg Chest Port 1 View  Result Date: 05/12/2017 CLINICAL DATA:  Shortness of breath and cough. History of breast cancer. EXAM: PORTABLE CHEST 1 VIEW COMPARISON:  02/09/2016; chest CT - 02/09/2016 FINDINGS: Grossly unchanged cardiac silhouette and mediastinal contours with persistent obscuration of the right heart border secondary to grossly unchanged moderate size right-sided effusion and associated right basilar heterogeneous/consolidative opacities. No new focal airspace opacities. Mild pulmonary venous congestion without frank evidence of edema. No pneumothorax. No acute osseus abnormalities. Surgical clips overlie the upper outer quadrant of the right breast. IMPRESSION: Grossly unchanged moderate-sized right-sided effusion with associated right basilar opacities, atelectasis versus infiltrate. Appearance of the chest is grossly unchanged compared to the 02/2016 examination though as there has been no interval comparisons, an acute on chronic process is not excluded. Electronically Signed   By: Sandi Mariscal M.D.   On: 05/12/2017 14:19    Wt Readings from Last 3 Encounters:  05/12/17 79.8 kg (175 lb 14.4 oz)  04/18/16 72.6 kg (160 lb)  03/16/16 72.6 kg (160 lb)    EKG: Atrial fibrillation with rapid ventricular response.  No ischemia.  Physical Exam:  Blood pressure (!) 155/83, pulse (!) 109, temperature 97.7 F (36.5 C), temperature source Oral, resp. rate 18, height 5\' 4"  (1.626 m), weight 79.8 kg (175 lb 14.4 oz), SpO2 93 %. Body mass index is 30.19 kg/m. General: Well developed, well nourished, in no acute distress. Head: Normocephalic, atraumatic, sclera non-icteric, no xanthomas, nares are without discharge.  Neck: Negative for carotid bruits. JVD not elevated. Lungs: Clear bilaterally to auscultation without wheezes, rales, or rhonchi. Breathing is unlabored. Heart: RRR with S1 S2. No murmurs, rubs, or gallops  appreciated. Abdomen: Soft, non-tender, non-distended with normoactive bowel sounds. No hepatomegaly. No rebound/guarding. No obvious abdominal masses. Msk:  Strength and tone appear normal for age. Extremities: No clubbing or cyanosis. No edema.  Distal pedal pulses are 2+ and equal bilaterally. Neuro: Alert and oriented X 3. No facial asymmetry. No focal deficit. Moves all extremities spontaneously. Psych:  Responds to questions appropriately with a normal affect.     Assessment and Plan  72 year old female with history of atrial fibrillation who underwent attempt at cryoablation at [facility].  This was unsuccessful.  She has had recurrent atrial fibrillation and is currently anticoagulated with apixaban as an outpatient and on diltiazem for rate control.  She began noting increasing shortness of breath presented to  the emergency room where chest x-ray revealed large right pleural effusion as well as A. fib with a rapid ventricular response.  Due to the right pleural effusion she was taken off of apixaban and placed on heparin.  Cardizem was continued at 60 mg every 8 hours.  She was given 1 dose of IV digoxin.  Her heart rate is improved.  Blood pressure is treated with lisinopril.  She is scheduled for possible thoracentesis.  We will continue to attempt to control rate and follow post thoracentesis.  Will need to resume apixaban post procedure.  The increased ventricular response is likely secondary to the pleural effusion.  Rate control rather than rhythm control appears to be the protocol for this patient.  We will follow along with you.  Signed, Teodoro Spray MD 05/14/2017, 7:36 AM Pager: [phone]

## 2017-05-15 LAB — CBC
HCT: 37.2 % (ref 35.0–47.0)
Hemoglobin: 12.7 g/dL (ref 12.0–16.0)
MCH: 30.7 pg (ref 26.0–34.0)
MCHC: 34.2 g/dL (ref 32.0–36.0)
MCV: 89.5 fL (ref 80.0–100.0)
PLATELETS: 162 10*3/uL (ref 150–440)
RBC: 4.15 MIL/uL (ref 3.80–5.20)
RDW: 14.9 % — AB (ref 11.5–14.5)
WBC: 5.7 10*3/uL (ref 3.6–11.0)

## 2017-05-15 LAB — BASIC METABOLIC PANEL
Anion gap: 8 (ref 5–15)
BUN: 7 mg/dL (ref 6–20)
CALCIUM: 8.7 mg/dL — AB (ref 8.9–10.3)
CO2: 24 mmol/L (ref 22–32)
Chloride: 108 mmol/L (ref 101–111)
Creatinine, Ser: 0.61 mg/dL (ref 0.44–1.00)
GFR calc Af Amer: >60 mL/min (ref >60)
GLUCOSE: 94 mg/dL (ref 65–99)
Potassium: 3.5 mmol/L (ref 3.5–5.1)
Sodium: 140 mmol/L (ref 135–145)

## 2017-05-15 LAB — PH, BODY FLUID: PH, BODY FLUID: 7.5

## 2017-05-15 LAB — PROTEIN, BODY FLUID (OTHER): Total Protein, Body Fluid Other: 1.7 g/dL

## 2017-05-15 MED ORDER — DILTIAZEM HCL 30 MG PO TABS
60.0000 mg | ORAL_TABLET | Freq: Once | ORAL | Status: AC
  Administered 2017-05-15: 60 mg via ORAL
  Filled 2017-05-15: qty 2

## 2017-05-15 MED ORDER — DILTIAZEM HCL ER COATED BEADS 120 MG PO CP24: 240.0000 mg | ORAL_CAPSULE | Freq: Every day | ORAL | Status: DC

## 2017-05-15 MED ORDER — DILTIAZEM HCL ER COATED BEADS 240 MG PO CP24: 240.0000 mg | ORAL_CAPSULE | Freq: Every day | ORAL | 0 refills | Status: DC

## 2017-05-15 MED ORDER — DILTIAZEM HCL ER COATED BEADS 180 MG PO CP24
180.0000 mg | ORAL_CAPSULE | Freq: Every day | ORAL | Status: DC
  Administered 2017-05-15: 180 mg via ORAL
  Filled 2017-05-15: qty 1

## 2017-05-15 MED ORDER — APIXABAN 5 MG PO TABS
5.0000 mg | ORAL_TABLET | Freq: Two times a day (BID) | ORAL | Status: DC
  Administered 2017-05-15 – 2017-05-16 (×3): 5 mg via ORAL
  Filled 2017-05-15 (×2): qty 1

## 2017-05-15 NOTE — Progress Notes (Signed)
SATURATION QUALIFICATIONS: (This note is used to comply with regulatory documentation for home oxygen)  Patient Saturations on Room Air at Rest = 97%  Patient Saturations on Room Air while Ambulating = 94%  Patient Saturations on n/a Liters of oxygen while Ambulating = n/a%  Please briefly explain why patient needs home oxygen: Pt ambulated around nurses station and tolerated well, no complaints

## 2017-05-15 NOTE — Progress Notes (Signed)
Patient ambulated around nurses station x1. Tolerated well. HR maintained 90-110s, peaking at 115. Will continue to monitor.   Norma Hill M

## 2017-05-15 NOTE — Progress Notes (Addendum)
Ambulated with patient around nurses station.Pt tolerated well, no sob, no dizziness. HR remained in the 110's highest was 136. Will continue to monitor.

## 2017-05-15 NOTE — Plan of Care (Signed)
  Progressing Activity: Ability to tolerate increased activity will improve 05/15/2017 2324 - Progressing by Loran Senters, RN Cardiac: Ability to achieve and maintain adequate cardiopulmonary perfusion will improve 05/15/2017 2324 - Progressing by Loran Senters, RN

## 2017-05-15 NOTE — Progress Notes (Signed)
[facility] [place] NAME: Norma Hill    MR#:  [id number]  DATE OF BIRTH:  28-Sep-1945  SUBJECTIVE:  CHIEF COMPLAINT:   Chief Complaint  Patient presents with  . Shortness of Breath   SOB is resolved after thoracentesis. Tachycardic into 130 with ambulation. In Afib  REVIEW OF SYSTEMS:  CONSTITUTIONAL: No fever, fatigue or weakness.  EYES: No blurred or double vision.  EARS, NOSE, AND THROAT: No tinnitus or ear pain.  RESPIRATORY: No cough, have shortness of breath,no wheezing or hemoptysis.  CARDIOVASCULAR: No chest pain, orthopnea, edema.  GASTROINTESTINAL: No nausea, vomiting, diarrhea or abdominal pain.  GENITOURINARY: No dysuria, hematuria.  ENDOCRINE: No polyuria, nocturia,  HEMATOLOGY: No anemia, easy bruising or bleeding SKIN: No rash or lesion. MUSCULOSKELETAL: No joint pain or arthritis.   NEUROLOGIC: No tingling, numbness, weakness.  PSYCHIATRY: No anxiety or depression.   ROS  DRUG ALLERGIES:  No Known Allergies  VITALS:  Blood pressure 120/73, pulse 100, temperature 97.8 F (36.6 C), temperature source Oral, resp. rate 18, height 5\' 4"  (1.626 m), weight 77.2 kg (170 lb 3.2 oz), SpO2 96 %.  PHYSICAL EXAMINATION:  GENERAL:  72 y.o.-year-old patient lying in the bed with no acute distress.  EYES: Pupils equal, round, reactive to light and accommodation. No scleral icterus. Extraocular muscles intact.  HEENT: Head atraumatic, normocephalic. Oropharynx and nasopharynx clear.  NECK:  Supple, no jugular venous distention. No thyroid enlargement, no tenderness.  LUNGS: decreased breath sounds right side, no wheezing, rales,rhonchi or crepitation. No use of accessory muscles of respiration.  CARDIOVASCULAR: S1, S2 normal. No murmurs, rubs, or gallops.  ABDOMEN: Soft, nontender, nondistended. Bowel sounds present. No organomegaly or mass.  EXTREMITIES: No pedal edema, cyanosis, or clubbing.  NEUROLOGIC: Cranial nerves II  through XII are intact. Muscle strength 4-5/5 in all extremities. Sensation intact. Gait not checked.  PSYCHIATRIC: The patient is alert and oriented x 3.  SKIN: No obvious rash, lesion, or ulcer.   Physical Exam LABORATORY PANEL:   CBC Recent Labs  Lab 05/15/17 0440  WBC 5.7  HGB 12.7  HCT 37.2  PLT 162   ------------------------------------------------------------------------------------------------------------------  Chemistries  Recent Labs  Lab 05/12/17 1344  05/15/17 0440  NA 138   < > 140  K 3.3*   < > 3.5  CL 104   < > 108  CO2 25   < > 24  GLUCOSE 142*   < > 94  BUN 12   < > 7  CREATININE 0.90   < > 0.61  CALCIUM 9.3   < > 8.7*  MG 2.1  --   --    < > = values in this interval not displayed.   ------------------------------------------------------------------------------------------------------------------  Cardiac Enzymes Recent Labs  Lab 05/12/17 1344  TROPONINI <0.03   ------------------------------------------------------------------------------------------------------------------  RADIOLOGY:  Dg Chest Port 1 View  Result Date: 05/14/2017 CLINICAL DATA:  Status post right thoracentesis today. EXAM: PORTABLE CHEST 1 VIEW COMPARISON:  CT chest and single view of the chest 05/12/2017. FINDINGS: Right pleural effusion is markedly decreased after thoracentesis. No pneumothorax. Small left pleural effusion is again seen. There is some bibasilar airspace disease, worse on the right. Marked cardiomegaly is noted. Surgical clips are seen in the right axilla. No acute bony abnormality. IMPRESSION: Marked decrease in right pleural effusion after thoracentesis. Negative for pneumothorax. Small left pleural effusion and bibasilar airspace disease. Cardiomegaly without edema. Electronically Signed   By: Inge Rise M.D.  On: 05/14/2017 11:04   US Thoracentesis Asp Pleural Space W/img Guide  Result Date: 05/14/2017 INDICATION: 72 year old female with a history  of right-sided pleural effusion EXAM: ULTRASOUND GUIDED RIGHT THORACENTESIS MEDICATIONS: None. COMPLICATIONS: None PROCEDURE: An ultrasound guided thoracentesis was thoroughly discussed with the patient and questions answered. The benefits, risks, alternatives and complications were also discussed. The patient understands and wishes to proceed with the procedure. Written consent was obtained. Ultrasound was performed to localize and mark an adequate pocket of fluid in the right chest. The area was then prepped and draped in the normal sterile fashion. 1% Lidocaine was used for local anesthesia. Under ultrasound guidance a 8 Fr Safe-T-Centesis catheter was introduced. Thoracentesis was performed. The catheter was removed and a dressing applied. FINDINGS: A total of approximately 1200 cc of thin yellow fluid was removed. Samples were sent to the laboratory as requested by the clinical team. IMPRESSION: Status post right-sided thoracentesis. Signed, Dulcy Fanny. Earleen Newport, DO [facility] Electronically Signed   By: Corrie Mckusick D.O.   On: 05/14/2017 10:57    ASSESSMENT AND PLAN:   Active Problems:   SOB (shortness of breath)   Pleural effusion  Patient 72 year old white female presenting with shortness of breath  1.  A. fib with RVR Started on cardizem PO. Changed to CD but still tachycardic. Gave one dose of 60 mg PO and will increase to 240 mg from AM Continue telemetry On Eliquis  2.  Right pleural effusion - s/p thoracentesis 1.2 Liters. Transudate. Etiology is unclear. Seen by pulmonary. F/u as OP in 2 weeks for repeat CXR  3.  Essential hypertension Continue lisinopril and cardizem  4.  Eliquis for DVT prophylaxis   All the records are reviewed and case discussed with Care Management/Social Workerr. Management plans discussed with the patient, family and they are in agreement.  CODE STATUS: Full.  TOTAL TIME TAKING CARE OF  THIS PATIENT: 35 minutes.   POSSIBLE D/C IN 1-2 DAYS, DEPENDING ON CLINICAL CONDITION.  Neita Carp M.D on 05/15/2017   Between 7am to 6pm - Pager - [phone]  After 6pm go to [email or web address] - password [email or web address] [facility]  Office  [phone]  CC: Primary care physician; Guadalupe Maple, MD  Note: This dictation was prepared with Dragon dictation along with smaller phrase technology. Any transcriptional errors that result from this process are unintentional.

## 2017-05-16 ENCOUNTER — Other Ambulatory Visit: Payer: Self-pay | Admitting: Internal Medicine

## 2017-05-16 DIAGNOSIS — J90 Pleural effusion, not elsewhere classified: Secondary | ICD-10-CM

## 2017-05-16 LAB — CYTOLOGY - NON PAP

## 2017-05-16 MED ORDER — DILTIAZEM HCL ER COATED BEADS 300 MG PO CP24: 300.0000 mg | ORAL_CAPSULE | Freq: Every day | ORAL | 0 refills | Status: DC

## 2017-05-16 MED ORDER — DILTIAZEM HCL ER COATED BEADS 240 MG PO CP24
240.0000 mg | ORAL_CAPSULE | Freq: Every day | ORAL | Status: DC
  Filled 2017-05-16: qty 1

## 2017-05-16 MED ORDER — DILTIAZEM HCL ER COATED BEADS 180 MG PO CP24
300.0000 mg | ORAL_CAPSULE | Freq: Every day | ORAL | Status: DC
  Administered 2017-05-16: 300 mg via ORAL
  Filled 2017-05-16: qty 1

## 2017-05-16 NOTE — Care Management Important Message (Signed)
Important Message  Patient Details  Name: Norma Hill MRN: [id number] Date of Birth: [birth year]   Medicare Important Message Given:  Yes Signed IM notice given    Katrina Stack, RN 05/16/2017, 9:25 AM

## 2017-05-16 NOTE — Progress Notes (Signed)
Patient Name: Annebelle Bostic Date of Encounter: 05/16/2017  Hospital Problem List     Active Problems:   SOB (shortness of breath)   Pleural effusion    Patient Profile     Large pleural effusion.Afib with increase vr with ambulation  Subjective   Sob, improved from admission.   Inpatient Medications    . apixaban  5 mg Oral BID  . digoxin  0.25 mg Intravenous Once  . diltiazem  240 mg Oral Daily  . lisinopril  10 mg Oral Daily  . multivitamin with minerals  1 tablet Oral Daily    Vital Signs    Vitals:   05/15/17 1940 05/16/17 0348 05/16/17 0609 05/16/17 0729  BP: 120/73 132/71  (!) 145/76  Pulse: 100 (!) 102 87 94  Resp: 18 19    Temp: 97.8 F (36.6 C) 98 F (36.7 C)  97.6 F (36.4 C)  TempSrc: Oral     SpO2: 96% 92%  91%  Weight:      Height:        Intake/Output Summary (Last 24 hours) at 05/16/2017 0807 Last data filed at 05/15/2017 1850 Gross per 24 hour  Intake 480 ml  Output 1650 ml  Net -1170 ml   Filed Weights   05/12/17 1352 05/12/17 2013 05/15/17 0534  Weight: 72.6 kg (160 lb) 79.8 kg (175 lb 14.4 oz) 77.2 kg (170 lb 3.2 oz)    Physical Exam    GEN: Well nourished, well developed, in no acute distress.  HEENT: normal.  Neck: Supple, no JVD, carotid bruits, or masses. Cardiac: irr, irr, no murmurs, rubs, or gallops. No clubbing, cyanosis, edema.  Radials/DP/PT 2+ and equal bilaterally.  Respiratory:  Respirations regular and unlabored, clear to auscultation bilaterally. GI: Soft, nontender, nondistended, BS + x 4. MS: no deformity or atrophy. Skin: warm and dry, no rash. Neuro:  Strength and sensation are intact. Psych: Normal affect.  Labs    CBC Recent Labs    05/14/17 0607 05/15/17 0440  WBC 6.6 5.7  HGB 13.3 12.7  HCT 40.2 37.2  MCV 91.0 89.5  PLT 176 440   Basic Metabolic Panel Recent Labs    05/15/17 0440  NA 140  K 3.5  CL 108  CO2 24  GLUCOSE 94  BUN 7  CREATININE 0.61  CALCIUM 8.7*   Liver Function  Tests No results for input(s): AST, ALT, ALKPHOS, BILITOT, PROT, ALBUMIN in the last 72 hours. No results for input(s): LIPASE, AMYLASE in the last 72 hours. Cardiac Enzymes No results for input(s): CKTOTAL, CKMB, CKMBINDEX, TROPONINI in the last 72 hours. BNP No results for input(s): BNP in the last 72 hours. D-Dimer No results for input(s): DDIMER in the last 72 hours. Hemoglobin A1C No results for input(s): HGBA1C in the last 72 hours. Fasting Lipid Panel No results for input(s): CHOL, HDL, LDLCALC, TRIG, CHOLHDL, LDLDIRECT in the last 72 hours. Thyroid Function Tests No results for input(s): TSH, T4TOTAL, T3FREE, THYROIDAB in the last 72 hours.  Invalid input(s): FREET3  Telemetry    afib with vr 105  ECG    afib  Radiology    Ct Angio Chest Pe W Or Wo Contrast  Result Date: 05/12/2017 CLINICAL DATA:  History of atrial fibrillation and shortness of Breath EXAM: CT ANGIOGRAPHY CHEST WITH CONTRAST TECHNIQUE: Multidetector CT imaging of the chest was performed using the standard protocol during bolus administration of intravenous contrast. Multiplanar CT image reconstructions and MIPs were obtained to evaluate the  vascular anatomy. CONTRAST:  45mL ISOVUE-370 IOPAMIDOL (ISOVUE-370) INJECTION 76% COMPARISON:  Plain film from earlier in the same day as well as CT from 02/09/2016 FINDINGS: Cardiovascular: The thoracic aorta is incompletely opacified. Aortic calcifications are noted without aneurysmal dilatation. Mild cardiomegaly is seen. The pulmonary artery shows a normal branching pattern. No filling defect to suggest pulmonary embolism is noted. Mediastinum/Nodes: The esophagus is within normal limits. No significant hilar or mediastinal adenopathy is seen. The thoracic inlet demonstrates the left lobe of the thyroid to be enlarged but stable from the prior study. Lungs/Pleura: The medial aspect of the left lung apex there is a somewhat nodular appearing density identified which  measures approximately 10 mm and is stable from the prior exam. Small left pleural effusion is noted. Mild left lower lobe atelectatic changes are seen. The right lung shows evidence of a large right pleural effusion increased in the interval from the prior exam. Some middle lobe consolidation is noted similar to that noted on the prior study. Mild changes in the right lower lobe are noted although the degree of aeration is improved when compared with the prior study. No definitive mass lesion is seen. Upper Abdomen: Visualized upper abdomen again demonstrates some mild nodularity to the liver stable from the previous exam. No other focal abnormality is seen. Musculoskeletal: Degenerative changes of the thoracic spine are noted. No other bony abnormality is seen. Review of the MIP images confirms the above findings. IMPRESSION: Large right-sided pleural effusion increased from the prior exam. The degree of consolidation within the right lower lobe has improved from the prior exam. Persistent consolidation involving the right middle lobe is seen. Small left pleural effusion.  Mild left basilar atelectasis is seen. Nodular appearing density in the medial aspect of the left apex. It is stable in appearance from the prior exam. Given the stability an aggressive process is felt to be less likely although follow-up examination in 3-6 months is recommended. No evidence of pulmonary emboli. Aortic Atherosclerosis (ICD10-I70.0). Electronically Signed   By: Inez Catalina M.D.   On: 05/12/2017 20:52   Dg Chest Port 1 View  Result Date: 05/14/2017 CLINICAL DATA:  Status post right thoracentesis today. EXAM: PORTABLE CHEST 1 VIEW COMPARISON:  CT chest and single view of the chest 05/12/2017. FINDINGS: Right pleural effusion is markedly decreased after thoracentesis. No pneumothorax. Small left pleural effusion is again seen. There is some bibasilar airspace disease, worse on the right. Marked cardiomegaly is noted. Surgical  clips are seen in the right axilla. No acute bony abnormality. IMPRESSION: Marked decrease in right pleural effusion after thoracentesis. Negative for pneumothorax. Small left pleural effusion and bibasilar airspace disease. Cardiomegaly without edema. Electronically Signed   By: Inge Rise M.D.   On: 05/14/2017 11:04   Dg Chest Port 1 View  Result Date: 05/12/2017 CLINICAL DATA:  Shortness of breath and cough. History of breast cancer. EXAM: PORTABLE CHEST 1 VIEW COMPARISON:  02/09/2016; chest CT - 02/09/2016 FINDINGS: Grossly unchanged cardiac silhouette and mediastinal contours with persistent obscuration of the right heart border secondary to grossly unchanged moderate size right-sided effusion and associated right basilar heterogeneous/consolidative opacities. No new focal airspace opacities. Mild pulmonary venous congestion without frank evidence of edema. No pneumothorax. No acute osseus abnormalities. Surgical clips overlie the upper outer quadrant of the right breast. IMPRESSION: Grossly unchanged moderate-sized right-sided effusion with associated right basilar opacities, atelectasis versus infiltrate. Appearance of the chest is grossly unchanged compared to the 02/2016 examination though as there has been  no interval comparisons, an acute on chronic process is not excluded. Electronically Signed   By: Sandi Mariscal M.D.   On: 05/12/2017 14:19   US Thoracentesis Asp Pleural Space W/img Guide  Result Date: 05/14/2017 INDICATION: 72 year old female with a history of right-sided pleural effusion EXAM: ULTRASOUND GUIDED RIGHT THORACENTESIS MEDICATIONS: None. COMPLICATIONS: None PROCEDURE: An ultrasound guided thoracentesis was thoroughly discussed with the patient and questions answered. The benefits, risks, alternatives and complications were also discussed. The patient understands and wishes to proceed with the procedure. Written consent was obtained. Ultrasound was performed to localize and mark  an adequate pocket of fluid in the right chest. The area was then prepped and draped in the normal sterile fashion. 1% Lidocaine was used for local anesthesia. Under ultrasound guidance a 8 Fr Safe-T-Centesis catheter was introduced. Thoracentesis was performed. The catheter was removed and a dressing applied. FINDINGS: A total of approximately 1200 cc of thin yellow fluid was removed. Samples were sent to the laboratory as requested by the clinical team. IMPRESSION: Status post right-sided thoracentesis. Signed, Dulcy Fanny. Earleen Newport, DO [facility] Electronically Signed   By: Corrie Mckusick D.O.   On: 05/14/2017 10:57    Assessment & Plan    afib-still with a fairly rapid vr especially with ambulation. Will Increase diltiazem to 300 mg daily. Continue with apixaban 5 mg bid .   Signed, Javier Docker Fath MD 05/16/2017, 8:07 AM  Pager: [phone]

## 2017-05-17 LAB — BODY FLUID CULTURE: CULTURE: NO GROWTH

## 2017-05-17 NOTE — Discharge Summary (Signed)
[facility] at [place] NAME: Norma Hill    MR#:  [id number]  DATE OF BIRTH:  [birth year]  DATE OF ADMISSION:  05/12/2017 ADMITTING PHYSICIAN: Dustin Flock, MD  DATE OF DISCHARGE: 05/16/2017  2:43 PM  PRIMARY CARE PHYSICIAN: Guadalupe Maple, MD   ADMISSION DIAGNOSIS:  SOB (shortness of breath) [R06.02] Atrial fibrillation with RVR (HCC) [I48.91] Dyspnea, unspecified type [R06.00]  DISCHARGE DIAGNOSIS:  Active Problems:   SOB (shortness of breath)   Pleural effusion   SECONDARY DIAGNOSIS:   Past Medical History:  Diagnosis Date  . A-fib ([place])   . Bowel trouble 2012   ocasionally  . 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 Pt then had a repeat excision on 11/16/2000  . Hypertension 2006  . Personal history of malignant neoplasm of breast 2002  . Solitary cyst of breast 2012  . Special screening for malignant neoplasms, colon      ADMITTING HISTORY  HISTORY OF PRESENT ILLNESS: Norma Hill  is a 72 y.o. female with a known history of atrial fibrillation in the past who has had ablation, essential hypertension and history of breast cancer who is presenting with complaint of shortness of breath.  She reports that she has been short of breath for past 1 week especially with exertion progressively has gotten worse.  Patient in the ER was noted to have A. fib with RVR however heart rate has been fluctuating when she was ambulated a heart rate again went up high.  Patient had a chest x-ray which suggested a right-sided loculated pleural effusion which patient states that she has no idea about in the past.  Patient had a CT scan in 2017 which showed similar kind of finding.  She does complain of some lower extremity edema.  HOSPITAL COURSE:   Patient 72 year old white female presenting with shortness of breath  1.A. fib with RVR Started on cardizem PO. Changed to  CD.  Dose was slowly increased.  Followed by Dr. Ubaldo Glassing of cardiology during hospital stay.  Suggested 300 mg Cardizem.  Patient was ambulated on day of discharge with heart rate going as high as 114.  Baseline heart rate in the 80s.  Patient is being discharged home on oral Cardizem, Eliquis to follow-up with her cardiologist at [facility].  No palpitations or shortness of breath at discharge.  2.Right pleural effusion - s/p thoracentesis 1.2 Liters. Transudate. Etiology is unclear. Seen by pulmonary. F/u as OP in 2 weeks for repeat CXR. May need bronchoscopy if continues to have atelectasis in the right middle lobe.  3.Essential hypertension Continue lisinopril and cardizem  Stable for discharge home.  CONSULTS OBTAINED:  Treatment Team:  Teodoro Spray, MD  DRUG ALLERGIES:  No Known Allergies  DISCHARGE MEDICATIONS:   Allergies as of 05/16/2017   No Known Allergies     Medication List    TAKE these medications   apixaban 5 MG Tabs tablet Commonly known as:  ELIQUIS Take 1 tablet (5 mg total) by mouth 2 (two) times daily.   diltiazem 300 MG 24 hr capsule Commonly known as:  CARDIZEM CD Take 1 capsule (300 mg total) by mouth daily.   lisinopril 10 MG tablet Commonly known as:  PRINIVIL,ZESTRIL Take 10 mg by mouth daily.   multivitamin with minerals Tabs tablet Take 1 tablet by mouth daily.       Today   VITAL SIGNS:  Blood pressure (!) 145/76, pulse 94, temperature 97.6 F (36.4 C), resp. rate 19, height 5\' 4"  (1.626 m), weight 77.2 kg (170 lb 3.2 oz), SpO2 91 %.  I/O:  No intake or output data in the 24 hours ending 05/17/17 1434  PHYSICAL EXAMINATION:  Physical Exam  GENERAL:  72 y.o.-year-old patient lying in the bed with no acute distress.  LUNGS: Normal breath sounds bilaterally, no wheezing, rales,rhonchi or crepitation. No use of accessory muscles of respiration.  CARDIOVASCULAR: S1, S2 normal. No murmurs, rubs, or gallops.  ABDOMEN: Soft, non-tender,  non-distended. Bowel sounds present. No organomegaly or mass.  NEUROLOGIC: Moves all 4 extremities. PSYCHIATRIC: The patient is alert and oriented x 3.  SKIN: No obvious rash, lesion, or ulcer.   DATA REVIEW:   CBC Recent Labs  Lab 05/15/17 0440  WBC 5.7  HGB 12.7  HCT 37.2  PLT 162    Chemistries  Recent Labs  Lab 05/12/17 1344  05/15/17 0440  NA 138   < > 140  K 3.3*   < > 3.5  CL 104   < > 108  CO2 25   < > 24  GLUCOSE 142*   < > 94  BUN 12   < > 7  CREATININE 0.90   < > 0.61  CALCIUM 9.3   < > 8.7*  MG 2.1  --   --    < > = values in this interval not displayed.    Cardiac Enzymes Recent Labs  Lab 05/12/17 1344  TROPONINI <0.03    Microbiology Results  Results for orders placed or performed during the hospital encounter of 05/12/17  Body fluid culture     Status: None   Collection Time: 05/14/17 10:48 AM  Result Value Ref Range Status   Specimen Description   Final    PLEURAL Performed at [facility], [address], [place], [place] [address]    Special Requests   Final    NONE Performed at [facility], [address] [place], [place] [address]    Gram Stain   Final    WBC PRESENT, PREDOMINANTLY MONONUCLEAR NO ORGANISMS SEEN CYTOSPIN SMEAR    Culture   Final    NO GROWTH 3 DAYS Performed at [facility], [place] [address] [place], [place] [address]    Report Status 05/17/2017 FINAL  Final    RADIOLOGY:  No results found.  Follow up with PCP in 1 week.  Management plans discussed with the patient, family and they are in agreement.  CODE STATUS:  Code Status History    Date Active Date Inactive Code Status Order ID Comments User Context   05/12/2017 20:14 05/16/2017 17:49 Full Code [id number]  Dustin Flock, MD Inpatient   02/10/2016 00:52 02/11/2016 16:03 Full Code [id number]  Hugelmeyer, Ubaldo Glassing, DO Inpatient      TOTAL TIME TAKING CARE OF THIS PATIENT ON DAY OF DISCHARGE: more than 30 minutes.   Neita Carp M.D on 05/17/2017 at 2:34 PM  Between 7am to 6pm - Pager - [phone]  After 6pm go to [email or web address] - password [email or web address] [facility]  Office  [phone]  CC: Primary care physician; Guadalupe Maple, MD  Note: This dictation was prepared with Dragon dictation along with smaller phrase technology. Any transcriptional errors that result from this process are unintentional.

## 2017-05-18 ENCOUNTER — Telehealth: Payer: Self-pay

## 2017-05-18 NOTE — Telephone Encounter (Signed)
I have made the 1st attempt to contact the patient or family member in charge, in order to follow up from recently being discharged from the hospital. I left a message on voicemail but I will make another attempt at a different time.  

## 2017-05-18 NOTE — Telephone Encounter (Signed)
I have made the 2nd attempt to contact the patient or family member in charge, in order to follow up from recently being discharged from the hospital. I left a message on voicemail. 

## 2017-05-30 ENCOUNTER — Encounter: Payer: Self-pay | Admitting: Internal Medicine

## 2017-05-30 ENCOUNTER — Ambulatory Visit
Admission: RE | Admit: 2017-05-30 | Discharge: 2017-05-30 | Disposition: A | Discharge status: Home / Self Care | Payer: Medicare Other | Source: Ambulatory Visit | Attending: Internal Medicine | Admitting: Internal Medicine

## 2017-05-30 ENCOUNTER — Ambulatory Visit: Payer: Medicare Other | Admitting: Internal Medicine

## 2017-05-30 ENCOUNTER — Other Ambulatory Visit: Payer: Self-pay | Admitting: *Deleted

## 2017-05-30 VITALS — BP 124/86 | HR 111 | Resp 16 | Ht 64.0 in | Wt 168.0 lb

## 2017-05-30 DIAGNOSIS — J90 Pleural effusion, not elsewhere classified: Secondary | ICD-10-CM

## 2017-05-30 NOTE — Patient Instructions (Addendum)
Will repeat x ray of chest in about 3 months and follow up here after that.  If your breathing worsens before then, call us and we will order a chest x ray earlier.

## 2017-05-30 NOTE — Progress Notes (Signed)
* [facility]     Assessment and Plan:  S/p Acute hypoxic respiratory failure with moderate to large right pleural effusion.  -Patient symptoms have resolved, status post thoracentesis on 05/14/17. - Repeat chest x-ray today shows small rim of pleural effusion/pleural thickening, which appears unchanged since previous thoracentesis. - We will repeat chest x-ray in 3 months and follow-up after.  She is instructed to call us sooner if she develops dyspnea before then.  Right middle lobe atelectasis, possibly secondary to pneumonia -Appears significantly improved on most recent chest x-ray, and may have been the cause of the patient's pleural effusion --I suspect that there is some degree of pleural thickening/fibrosis likely due to previous radiation.   Date: 05/30/2017  MRN# [id number] Norma Hill 1945/10/07   Norma Hill is a 72 y.o. old female seen in follow up for chief complaint of  Chief Complaint  Patient presents with  . Follow-up    2 week f/u pleurel effusion right. Pt denies cough/sob/chest tightness and or wheezing.     HPI:   Patient was seen in the hospital, she presented with dyspnea and tachycardia on May 12, 2017, she was found to have a large right-sided pleural effusion she underwent a right-sided thoracentesis with removal of 1200 cc.  There was no concomitant serum chemistry drawn at that time however review of the pleural fluid studies suggest that it was transudate, lymphocytic with negative culture and cytology.  She was discharged on 3/13 with plans for follow-up in the pulmonary office with repeat chest x-ray to ensure that the pleural fluid was not returning, and that the right middle lobe atelectasis had improved.She has a history of right-sided breast cancer treated with lumpectomy and radiation in 2002, subsequently treated with tamoxifen for 5 years.  Imaging personally reviewed; repeat chest x-ray today 05/30/17, today there  is small to moderate right-sided pleural effusion, which appears to be much smaller in comparison to previous seen on 3/9.  It does not appear to be significantly changed from post thoracentesis chest x-ray performed on 3/11.   CT chest 05/12/17; moderate right-sided pleural effusion, compressive atelectasis; RML atelectasis, possible pneumonia. Bi-atrial dilatation, pulmonary arterial dilation c/w volume overload with pulmonary hypertension.  On previous CT imaging from 02/09/16, there was a small loculated right pleural effusion with early changes of right middle lobe atelectasis.    Medication:    Current Outpatient Medications:  .  apixaban (ELIQUIS) 5 MG TABS tablet, Take 1 tablet (5 mg total) by mouth 2 (two) times daily., Disp: 60 tablet, Rfl: 0 .  diltiazem (CARDIZEM CD) 300 MG 24 hr capsule, Take 1 capsule (300 mg total) by mouth daily., Disp: 30 capsule, Rfl: 0 .  lisinopril (PRINIVIL,ZESTRIL) 10 MG tablet, Take 10 mg by mouth daily., Disp: , Rfl:  .  Multiple Vitamin (MULTIVITAMIN WITH MINERALS) TABS tablet, Take 1 tablet by mouth daily., Disp: , Rfl:    Allergies:  Patient has no known allergies.  Review of Systems: Gen:  Denies  fever, sweats. HEENT: Denies blurred vision. Cvc:  No dizziness, chest pain or heaviness Resp:   Denies cough or sputum porduction. Gi: Denies swallowing difficulty, stomach pain. constipation, bowel incontinence Gu:  Denies bladder incontinence, burning urine Ext:   No Joint pain, stiffness. Skin: No skin rash, easy bruising. Endoc:  No polyuria, polydipsia. Psych: No depression, insomnia. Other:  All other systems were reviewed and found to be negative other than what is mentioned in the HPI.  Physical Examination:   VS: BP 124/86 (BP Location: Left Arm, Cuff Size: Normal)   Pulse (!) 111   Resp 16   Ht 5\' 4"  (1.626 m)   Wt 168 lb (76.2 kg)   SpO2 97%   BMI 28.84 kg/m   General Appearance: No distress  Neuro:without focal findings,   speech normal,  HEENT: PERRLA, EOM intact. Pulmonary: normal breath sounds, No wheezing.   CardiovascularNormal S1,S2.  No m/r/g.   Abdomen: Benign, Soft, non-tender. Renal:  No costovertebral tenderness  GU:  Not performed at this time. Endoc: No evident thyromegaly, no signs of acromegaly. Skin:   warm, no rash. Extremities: normal, no cyanosis, clubbing.   LABORATORY PANEL:   CBC No results for input(s): WBC, HGB, HCT, PLT in the last 168 hours. ------------------------------------------------------------------------------------------------------------------  Chemistries  No results for input(s): NA, K, CL, CO2, GLUCOSE, BUN, CREATININE, CALCIUM, MG, AST, ALT, ALKPHOS, BILITOT in the last 168 hours.  Invalid input(s): GFRCGP ------------------------------------------------------------------------------------------------------------------  Cardiac Enzymes No results for input(s): TROPONINI in the last 168 hours. ------------------------------------------------------------  RADIOLOGY:   No results found for this or any previous visit. No results found for this or any previous visit. ------------------------------------------------------------------------------------------------------------------  Thank  you for allowing [facility] to assist in the care of your patient. Our recommendations are noted above.  Please contact us if we can be of further service.   Marda Stalker, MD.  [facility] Office Number: [phone]  Patricia Pesa, M.D.  Merton Border, M.D  05/30/2017

## 2017-12-17 MED ORDER — GENERIC EXTERNAL MEDICATION
8.00 | Status: DC
Start: ? — End: 2017-12-17

## 2017-12-17 MED ORDER — LISINOPRIL 10 MG PO TABS
10.00 | ORAL_TABLET | ORAL | Status: DC
Start: 2017-12-18 — End: 2017-12-17

## 2017-12-17 MED ORDER — POLYETHYLENE GLYCOL 3350 17 G PO PACK
17.00 | PACK | ORAL | Status: DC
Start: 2017-12-18 — End: 2017-12-17

## 2017-12-17 MED ORDER — GENERIC EXTERNAL MEDICATION
Status: DC
Start: ? — End: 2017-12-17

## 2017-12-17 MED ORDER — INFLUENZA VAC SPLIT QUAD 0.5 ML IM SUSY
.50 | PREFILLED_SYRINGE | INTRAMUSCULAR | Status: DC
Start: ? — End: 2017-12-17

## 2017-12-17 MED ORDER — DILTIAZEM HCL ER COATED BEADS 180 MG PO CP24
360.00 | ORAL_CAPSULE | ORAL | Status: DC
Start: 2017-12-18 — End: 2017-12-17

## 2017-12-17 MED ORDER — NITROGLYCERIN 0.4 MG SL SUBL
0.40 | SUBLINGUAL_TABLET | SUBLINGUAL | Status: DC
Start: ? — End: 2017-12-17

## 2017-12-17 MED ORDER — ACETAMINOPHEN 325 MG PO TABS
650.00 | ORAL_TABLET | ORAL | Status: DC
Start: ? — End: 2017-12-17

## 2017-12-17 MED ORDER — MORPHINE SULFATE 4 MG/ML IJ SOLN
2.00 | INTRAMUSCULAR | Status: DC
Start: ? — End: 2017-12-17

## 2017-12-17 MED ORDER — GENERIC EXTERNAL MEDICATION
2.00 | Status: DC
Start: 2017-12-17 — End: 2017-12-17

## 2017-12-17 MED ORDER — APIXABAN 5 MG PO TABS
5.00 | ORAL_TABLET | ORAL | Status: DC
Start: 2017-12-17 — End: 2017-12-17

## 2017-12-19 ENCOUNTER — Inpatient Hospital Stay: Payer: Medicare Other | Admitting: Family Medicine

## 2018-04-08 NOTE — Telephone Encounter (Signed)
closing encounter

## 2018-09-29 ENCOUNTER — Emergency Department: Payer: Medicare Other

## 2018-09-29 ENCOUNTER — Other Ambulatory Visit: Payer: Self-pay

## 2018-09-29 ENCOUNTER — Inpatient Hospital Stay
Admission: EM | Admit: 2018-09-29 | Discharge: 2018-10-01 | DRG: 292 | Disposition: A | Discharge status: Home / Self Care | Payer: Medicare Other | Attending: Internal Medicine | Admitting: Internal Medicine

## 2018-09-29 DIAGNOSIS — I110 Hypertensive heart disease with heart failure: Secondary | ICD-10-CM | POA: Diagnosis not present

## 2018-09-29 DIAGNOSIS — I361 Nonrheumatic tricuspid (valve) insufficiency: Secondary | ICD-10-CM | POA: Diagnosis not present

## 2018-09-29 DIAGNOSIS — I4819 Other persistent atrial fibrillation: Secondary | ICD-10-CM

## 2018-09-29 DIAGNOSIS — I340 Nonrheumatic mitral (valve) insufficiency: Secondary | ICD-10-CM | POA: Diagnosis not present

## 2018-09-29 DIAGNOSIS — J90 Pleural effusion, not elsewhere classified: Secondary | ICD-10-CM

## 2018-09-29 DIAGNOSIS — Z7901 Long term (current) use of anticoagulants: Secondary | ICD-10-CM | POA: Diagnosis not present

## 2018-09-29 DIAGNOSIS — Z20828 Contact with and (suspected) exposure to other viral communicable diseases: Secondary | ICD-10-CM | POA: Diagnosis present

## 2018-09-29 DIAGNOSIS — E876 Hypokalemia: Secondary | ICD-10-CM | POA: Diagnosis present

## 2018-09-29 DIAGNOSIS — Z853 Personal history of malignant neoplasm of breast: Secondary | ICD-10-CM

## 2018-09-29 DIAGNOSIS — I5043 Acute on chronic combined systolic (congestive) and diastolic (congestive) heart failure: Secondary | ICD-10-CM | POA: Diagnosis present

## 2018-09-29 DIAGNOSIS — I4891 Unspecified atrial fibrillation: Secondary | ICD-10-CM | POA: Diagnosis present

## 2018-09-29 LAB — COMPREHENSIVE METABOLIC PANEL
ALT: 15 U/L (ref 0–44)
AST: 27 U/L (ref 15–41)
Albumin: 4.7 g/dL (ref 3.5–5.0)
Alkaline Phosphatase: 93 U/L (ref 38–126)
Anion gap: 13 (ref 5–15)
BUN: 11 mg/dL (ref 8–23)
CO2: 21 mmol/L — ABNORMAL LOW (ref 22–32)
Calcium: 9.6 mg/dL (ref 8.9–10.3)
Chloride: 102 mmol/L (ref 98–111)
Creatinine, Ser: 0.66 mg/dL (ref 0.44–1.00)
GFR calc Af Amer: >60 mL/min (ref >60)
GFR calc non Af Amer: >60 mL/min (ref >60)
Glucose, Bld: 126 mg/dL — ABNORMAL HIGH (ref 70–99)
Potassium: 3.4 mmol/L — ABNORMAL LOW (ref 3.5–5.1)
Sodium: 136 mmol/L (ref 135–145)
Total Bilirubin: 1.1 mg/dL (ref 0.3–1.2)
Total Protein: 7.3 g/dL (ref 6.5–8.1)

## 2018-09-29 LAB — SARS CORONAVIRUS 2 BY RT PCR (HOSPITAL ORDER, PERFORMED IN ~~LOC~~ HOSPITAL LAB): SARS Coronavirus 2: NEGATIVE

## 2018-09-29 LAB — CBC
HCT: 40.2 % (ref 36.0–46.0)
Hemoglobin: 13.1 g/dL (ref 12.0–15.0)
MCH: 27.8 pg (ref 26.0–34.0)
MCHC: 32.6 g/dL (ref 30.0–36.0)
MCV: 85.4 fL (ref 80.0–100.0)
Platelets: 294 10*3/uL (ref 150–400)
RBC: 4.71 MIL/uL (ref 3.87–5.11)
RDW: 15.8 % — ABNORMAL HIGH (ref 11.5–15.5)
WBC: 8.9 10*3/uL (ref 4.0–10.5)
nRBC: 0 % (ref 0.0–0.2)

## 2018-09-29 LAB — BRAIN NATRIURETIC PEPTIDE: B Natriuretic Peptide: 988 pg/mL — ABNORMAL HIGH (ref 0.0–100.0)

## 2018-09-29 LAB — TROPONIN I (HIGH SENSITIVITY): Troponin I (High Sensitivity): 17 ng/L (ref <18)

## 2018-09-29 MED ORDER — DILTIAZEM HCL ER COATED BEADS 180 MG PO CP24
300.0000 mg | ORAL_CAPSULE | Freq: Every day | ORAL | Status: DC
  Administered 2018-09-29 – 2018-09-30 (×2): 300 mg via ORAL
  Filled 2018-09-29 (×2): qty 1

## 2018-09-29 MED ORDER — DILTIAZEM HCL 25 MG/5ML IV SOLN
INTRAVENOUS | Status: AC
  Filled 2018-09-29: qty 5

## 2018-09-29 MED ORDER — DILTIAZEM HCL 25 MG/5ML IV SOLN
20.0000 mg | Freq: Once | INTRAVENOUS | Status: AC
  Administered 2018-09-29: 20 mg via INTRAVENOUS

## 2018-09-29 MED ORDER — SODIUM CHLORIDE 0.9 % IV BOLUS
500.0000 mL | Freq: Once | INTRAVENOUS | Status: AC
  Administered 2018-09-29: 500 mL via INTRAVENOUS

## 2018-09-29 MED ORDER — DILTIAZEM HCL 25 MG/5ML IV SOLN
5.0000 mg | INTRAVENOUS | Status: AC
  Administered 2018-09-29: 5 mg via INTRAVENOUS

## 2018-09-29 MED ORDER — FUROSEMIDE 10 MG/ML IJ SOLN
40.0000 mg | Freq: Once | INTRAMUSCULAR | Status: AC
  Administered 2018-09-29: 40 mg via INTRAVENOUS
  Filled 2018-09-29: qty 4

## 2018-09-29 NOTE — ED Provider Notes (Signed)
[facility] Emergency Department Provider Note       Time seen: ----------------------------------------- 8:16 PM on 09/29/2018 -----------------------------------------   I have reviewed the triage vital signs and the nursing notes.  HISTORY   Chief Complaint Shortness of Breath    HPI Norma Hill is a 73 y.o. female with a history of atrial fibrillation, cancer, hypertension who presents to the ED for shortness of breath that began this afternoon after taking her heart medication.  She denies any fevers, chills, chest pain,  vomiting or diarrhea.  Patient states she can tell her heart is racing.  She denies pain or diaphoresis.  She has been taking her medications appropriately.  Past Medical History:  Diagnosis Date  . A-fib ([place])   . Bowel trouble 2012   ocasionally  . Cancer ([place]) 10/2000   pt had 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 Pt then had a repeat excision on 11/16/2000  . Hypertension 2006  . Personal history of malignant neoplasm of breast 2002  . Solitary cyst of breast 2012  . Special screening for malignant neoplasms, colon     Patient Active Problem List   Diagnosis Date Noted  . Pleural effusion 05/13/2017  . SOB (shortness of breath) 05/12/2017  . New onset atrial fibrillation ([place]) 02/09/2016  . Personal history of malignant neoplasm of breast   . Cancer ([place]) 10/04/2000    Past Surgical History:  Procedure Laterality Date  . BREAST BIOPSY Right 2002  . BREAST SURGERY Right 2002   wide excision and sn x4 done 10/29/00 with repeat excision done on 11/16/2000 with post operative radiation treatment and 5 years of tamoxifen with completion in 2007  . CHOLECYSTECTOMY  2002  . COLONOSCOPY  2008  . DILATION AND CURETTAGE OF UTERUS  1990,s  . ELECTROPHYSIOLOGIC STUDY N/A 03/16/2016   Procedure: CARDIOVERSION;  Surgeon: Isaias Cowman, MD;  Location: [facility] ORS;  Service:  Cardiovascular;  Laterality: N/A;    Allergies Patient has no known allergies.  Social History Social History   Tobacco Use  . Smoking status: Never Smoker  . Smokeless tobacco: Never Used  Substance Use Topics  . Alcohol use: No  . Drug use: No   Review of Systems Constitutional: Negative for fever. Cardiovascular: Negative for chest pain.  Positive for palpitations Respiratory: Positive for shortness of breath Gastrointestinal: Negative for abdominal pain, vomiting and diarrhea. Musculoskeletal: Negative for back pain. Skin: Negative for rash. Neurological: Negative for headaches, focal weakness or numbness.  All systems negative/normal/unremarkable except as stated in the HPI  ____________________________________________   PHYSICAL EXAM:  VITAL SIGNS: ED Triage Vitals  Enc Vitals Group     BP 09/29/18 2009 (!) 168/105     Pulse Rate 09/29/18 2007 (!) 170     Resp 09/29/18 2007 (!) 22     Temp 09/29/18 2007 98.3 F (36.8 C)     Temp Source 09/29/18 2007 Oral     SpO2 09/29/18 2007 100 %     Weight 09/29/18 2005 140 lb (63.5 kg)     Height 09/29/18 2005 5\' 3"  (1.6 m)     Head Circumference --      Peak Flow --      Pain Score 09/29/18 2005 0     Pain Loc --      Pain Edu? --      Excl. in [place]? --    Constitutional: Alert and oriented. Well appearing and in no distress.  Eyes: Conjunctivae are normal. Normal extraocular movements. ENT      Head: Normocephalic and atraumatic.      Nose: No congestion/rhinnorhea.      Mouth/Throat: Mucous membranes are moist.      Neck: No stridor. Cardiovascular: Normal rate, regular rhythm. No murmurs, rubs, or gallops. Respiratory: Normal respiratory effort without tachypnea nor retractions. Breath sounds are clear and equal bilaterally. No wheezes/rales/rhonchi. Gastrointestinal: Soft and nontender. Normal bowel sounds Musculoskeletal: Nontender with normal range of motion in extremities. No lower extremity tenderness nor  edema. Neurologic:  Normal speech and language. No gross focal neurologic deficits are appreciated.  Skin:  Skin is warm, dry and intact. No rash noted. Psychiatric: Mood and affect are normal. Speech and behavior are normal.  ____________________________________________  EKG: Interpreted by me.  A. fib with RVR with a rate of 156 bpm, nonspecific ST and T wave changes, normal QT, normal axis  EKG repeated after me giving 20 mg IV Cardizem with atrial fibrillation, rate is 87 bpm, PVC, normal axis, normal QT  ____________________________________________  ED COURSE:  As part of my medical decision making, I reviewed the following data within the [place] History obtained from family if available, nursing notes, old chart and ekg, as well as notes from prior ED visits. Patient presented for rapid atrial fibrillation, we will assess with labs and imaging as indicated at this time. She was given IV Cardizem 20 mg with rate control.   Procedures  [name] was evaluated in Emergency Department on 09/29/2018 for the symptoms described in the history of present illness. She was evaluated in the context of the global COVID-19 pandemic, which necessitated consideration that the patient might be at risk for infection with the SARS-CoV-2 virus that causes COVID-19. Institutional protocols and algorithms that pertain to the evaluation of patients at risk for COVID-19 are in a state of rapid change based on information released by regulatory bodies including the CDC and federal and state organizations. These policies and algorithms were followed during the patient's care in the ED.  ____________________________________________   LABS (pertinent positives/negatives)  Labs Reviewed  CBC - Abnormal; Notable for the following components:      Result Value   RDW 15.8 (*)    All other components within normal limits  COMPREHENSIVE METABOLIC PANEL - Abnormal; Notable for the  following components:   Potassium 3.4 (*)    CO2 21 (*)    Glucose, Bld 126 (*)    All other components within normal limits  BRAIN NATRIURETIC PEPTIDE - Abnormal; Notable for the following components:   B Natriuretic Peptide 988.0 (*)    All other components within normal limits  SARS CORONAVIRUS 2 (HOSPITAL ORDER, [place] [facility])  TROPONIN I (HIGH SENSITIVITY)    RADIOLOGY Images were viewed by me  Chest x-ray IMPRESSION: Enlargement of cardiac silhouette with pulmonary vascular congestion.  Increased RIGHT pleural effusion and RIGHT basilar atelectasis since 05/30/2017.  ____________________________________________   DIFFERENTIAL DIAGNOSIS   Atrial fibrillation, MI, unstable angina, electrolyte abnormality, dehydration  FINAL ASSESSMENT AND PLAN  Atrial fibrillation with a rapid ventricular response, right pleural effusion, pulmonary vascular congestion   Plan: The patient had presented for rapid A. fib. Patient's labs does indicate likely CHF. Patient's imaging was concerning for same.  She has increased pleural effusion and pulmonary vascular congestion.  We gave IV Cardizem for rate control, she received IV Lasix here.  In light of the fact that her echo  was normal last year she would likely benefit from readmission and reevaluation with cardiology consultation, perhaps repeat echocardiogram.   Laurence Aly, MD    Note: This note was generated in part or whole with voice recognition software. Voice recognition is usually quite accurate but there are transcription errors that can and very often do occur. I apologize for any typographical errors that were not detected and corrected.     Earleen Newport, MD 09/29/18 2116

## 2018-09-29 NOTE — H&P (Signed)
Norma Hill is an 73 y.o. female.   Chief Complaint: Shortness of breath HPI: The patient with past medical history of atrial fibrillation and hypertension presents to the emergency department complaining of shortness of breath and palpitations.  The patient reports that her symptoms worsened over the last 2 days and has made her very fatigued.  In the emergency department heart rate was found to be greater than 150 and blood pressure elevated greater than 180/100 at times.  She was given a total of 25 mg of diltiazem IV which improved her heart rate and blood pressure temporarily.  However, both began to climb again prompting the emergency department staff to call the hospitalist service for admission.  Past Medical History:  Diagnosis Date  . A-fib ([place])   . Bowel trouble 2012   ocasionally  . Cancer ([place]) 10/2000   pt had 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 Pt then had a repeat excision on 11/16/2000  . Hypertension 2006  . Personal history of malignant neoplasm of breast 2002  . Solitary cyst of breast 2012  . Special screening for malignant neoplasms, colon     Past Surgical History:  Procedure Laterality Date  . BREAST BIOPSY Right 2002  . BREAST SURGERY Right 2002   wide excision and sn x4 done 10/29/00 with repeat excision done on 11/16/2000 with post operative radiation treatment and 5 years of tamoxifen with completion in 2007  . CHOLECYSTECTOMY  2002  . COLONOSCOPY  2008  . DILATION AND CURETTAGE OF UTERUS  1990,s  . ELECTROPHYSIOLOGIC STUDY N/A 03/16/2016   Procedure: CARDIOVERSION;  Surgeon: Isaias Cowman, MD;  Location: [facility] ORS;  Service: Cardiovascular;  Laterality: N/A;    Family History  Problem Relation Age of Onset  . Cancer Other        breast, relationship not listed   Social History:  reports that she has never smoked. She has never used smokeless tobacco. She reports that she does not drink alcohol  or use drugs.  Allergies: No Known Allergies  Medications Prior to Admission  Medication Sig Dispense Refill  . apixaban (ELIQUIS) 5 MG TABS tablet Take 1 tablet (5 mg total) by mouth 2 (two) times daily. 60 tablet 0    Results for orders placed or performed during the hospital encounter of 09/29/18 (from the past 48 hour(s))  CBC     Status: Abnormal   Collection Time: 09/29/18  8:16 PM  Result Value Ref Range   WBC 8.9 4.0 - 10.5 K/uL   RBC 4.71 3.87 - 5.11 MIL/uL   Hemoglobin 13.1 12.0 - 15.0 g/dL   HCT 40.2 36.0 - 46.0 %   MCV 85.4 80.0 - 100.0 fL   MCH 27.8 26.0 - 34.0 pg   MCHC 32.6 30.0 - 36.0 g/dL   RDW 15.8 (H) 11.5 - 15.5 %   Platelets 294 150 - 400 K/uL   nRBC 0.0 0.0 - 0.2 %    Comment: Performed at [facility], [address] [place], [place] [address]  Comprehensive metabolic panel     Status: Abnormal   Collection Time: 09/29/18  8:16 PM  Result Value Ref Range   Sodium 136 135 - 145 mmol/L   Potassium 3.4 (L) 3.5 - 5.1 mmol/L   Chloride 102 98 - 111 mmol/L   CO2 21 (L) 22 - 32 mmol/L   Glucose, Bld 126 (H) 70 - 99 mg/dL   BUN 11 8 - 23  mg/dL   Creatinine, Ser 0.66 0.44 - 1.00 mg/dL   Calcium 9.6 8.9 - 10.3 mg/dL   Total Protein 7.3 6.5 - 8.1 g/dL   Albumin 4.7 3.5 - 5.0 g/dL   AST 27 15 - 41 U/L   ALT 15 0 - 44 U/L   Alkaline Phosphatase 93 38 - 126 U/L   Total Bilirubin 1.1 0.3 - 1.2 mg/dL   GFR calc non Af Amer >60 >60 mL/min   GFR calc Af Amer >60 >60 mL/min   Anion gap 13 5 - 15    Comment: Performed at [facility], [place], [place] [address]  Troponin I (High Sensitivity)     Status: None   Collection Time: 09/29/18  8:16 PM  Result Value Ref Range   Troponin I (High Sensitivity) 17 <18 ng/L    Comment: (NOTE) Elevated high sensitivity troponin I (hsTnI) values and significant  changes across serial measurements may suggest ACS but many other  chronic and acute conditions are known to elevate hsTnI  results.  Refer to the "Links" section for chest pain algorithms and additional  guidance. Performed at [facility], [address] [place], [place] [address]   Brain natriuretic peptide     Status: Abnormal   Collection Time: 09/29/18  8:16 PM  Result Value Ref Range   B Natriuretic Peptide 988.0 (H) 0.0 - 100.0 pg/mL    Comment: Performed at [facility], [address] [place], [place] [address]   Dg Chest 1 View  Result Date: 09/29/2018 CLINICAL DATA:  Shortness of breath, atrial fibrillation with rapid ventricular response EXAM: CHEST  1 VIEW COMPARISON:  Portable exam 2010 hours compared to 05/30/2017 FINDINGS: Enlargement of cardiac silhouette with pulmonary vascular congestion. Mediastinal contours normal. Atherosclerotic calcification aorta. RIGHT pleural effusion increased from previous exam with significant RIGHT basilar atelectasis. Underlying abnormalities of the RIGHT lower lobe not excluded. Remaining lungs clear. No pneumothorax or acute osseous findings. IMPRESSION: Enlargement of cardiac silhouette with pulmonary vascular congestion. Increased RIGHT pleural effusion and RIGHT basilar atelectasis since 05/30/2017. Electronically Signed   By: Lavonia Dana M.D.   On: 09/29/2018 20:29    Review of Systems  Constitutional: Positive for malaise/fatigue. Negative for chills and fever.  HENT: Negative for sore throat and tinnitus.   Eyes: Negative for blurred vision and redness.  Respiratory: Positive for shortness of breath. Negative for cough.   Cardiovascular: Positive for palpitations. Negative for chest pain, orthopnea and PND.  Gastrointestinal: Negative for abdominal pain, diarrhea, nausea and vomiting.  Genitourinary: Negative for dysuria, frequency and urgency.  Musculoskeletal: Negative for joint pain and myalgias.  Skin: Negative for rash.       No lesions  Neurological: Negative for speech change, focal weakness and weakness.   Endo/Heme/Allergies: Does not bruise/bleed easily.       No temperature intolerance  Psychiatric/Behavioral: Negative for depression and suicidal ideas.    Blood pressure (!) 163/95, pulse 91, temperature 98.3 F (36.8 C), temperature source Oral, resp. rate (!) 26, height 5\' 3"  (1.6 m), weight 63.5 kg, SpO2 93 %. Physical Exam  Vitals reviewed. Constitutional: She is oriented to person, place, and time. She appears well-developed and well-nourished. No distress.  HENT:  Head: Normocephalic and atraumatic.  Mouth/Throat: Oropharynx is clear and moist.  Eyes: Pupils are equal, round, and reactive to light. Conjunctivae and EOM are normal. No scleral icterus.  Neck: Normal range of motion. Neck supple. No JVD present. No tracheal  deviation present. No thyromegaly present.  Cardiovascular: Normal rate, regular rhythm and normal heart sounds. Exam reveals no gallop and no friction rub.  No murmur heard. Respiratory: Effort normal and breath sounds normal.  GI: Soft. Bowel sounds are normal. She exhibits no distension. There is no abdominal tenderness.  Genitourinary:    Genitourinary Comments: Deferred   Musculoskeletal: Normal range of motion.        General: Edema present.  Lymphadenopathy:    She has no cervical adenopathy.  Neurological: She is alert and oriented to person, place, and time. No cranial nerve deficit. She exhibits normal muscle tone.  Skin: Skin is warm and dry. No rash noted. No erythema.  Psychiatric: She has a normal mood and affect. Her behavior is normal. Judgment and thought content normal.     Assessment/Plan This is a 73 year old female admitted for atrial fibrillation with rapid ventricular rate. 1.  A. fib: With RVR; heart rate improves intermittently following multiple pushes of diltiazem.  I have restarted the patient's long-acting diltiazem as well.  Monitor heart rate.  Consider diltiazem drip or digoxin if heart rate continues to be uncontrolled. 2.   CHF: Acute on chronic; systolic.  The patient has responded well to IV Lasix.  Will restart oral Lasix for maintenance of fluid balance.  She is likely lost atrial kick during this episode of rapid ventricular rate which has exacerbated her heart failure.  Obtain echocardiogram.  Consult cardiology. 3.  Hypertension: Uncontrolled; may improve as the patient reaches steady state of diltiazem.  Continue lisinopril.  Labetalol as needed. 4.  Hypokalemia: Replete potassium 5.  DVT prophylaxis: Eliquis 6.  GI prophylaxis: None The patient is a full code.  I have personally spent 45 minutes in critical care time with this patient.  Harrie Foreman, MD 09/29/2018, 9:44 PM

## 2018-09-29 NOTE — ED Notes (Signed)
ED Provider at bedside. 

## 2018-09-29 NOTE — ED Triage Notes (Signed)
Pt states she began to feel short of breath this afternoon after taking her "heart" medication. Pt denies fever. Pt states she felt like she was going to faint when it began. Pt denies pain, diaphoresis, cough or fever. Pt is able to speak in full sentences without difficulty.

## 2018-09-29 NOTE — ED Notes (Signed)
ED TO INPATIENT HANDOFF REPORT  ED Nurse Name and Phone #: Balinda Quails Name/Age/Gender Norma Hill 73 y.o. female Room/Bed: ED04A/ED04A  Code Status   Code Status: Prior  Home/SNF/Other Home Patient oriented to: self, place, time and situation Is this baseline? Yes   Triage Complete: Triage complete  Chief Complaint Chest Pain  Triage Note Pt states she began to feel short of breath this afternoon after taking her "heart" medication. Pt denies fever. Pt states she felt like she was going to faint when it began. Pt denies pain, diaphoresis, cough or fever. Pt is able to speak in full sentences without difficulty.    Allergies No Known Allergies  Level of Care/Admitting Diagnosis ED Disposition    ED Disposition Condition [facility] Area: [place] [[id number]]  Level of Care: Telemetry [5]  Covid Evaluation: Confirmed COVID Negative  Diagnosis: Atrial fibrillation with RVR [facility]) [[id number]]  Admitting Physician: Harrie Foreman [[id number]]  Attending Physician: Harrie Foreman [[id number]]  Estimated length of stay: past midnight tomorrow  Certification:: I certify this patient will need inpatient services for at least 2 midnights  PT Class (Do Not Modify): Inpatient [101]  PT Acc Code (Do Not Modify): Private [1]       B Medical/Surgery History Past Medical History:  Diagnosis Date  . A-fib ([place])   . Bowel trouble 2012   ocasionally  . Cancer ([place]) 10/2000   pt had 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 Pt then had a repeat excision on 11/16/2000  . Hypertension 2006  . Personal history of malignant neoplasm of breast 2002  . Solitary cyst of breast 2012  . Special screening for malignant neoplasms, colon    Past Surgical History:  Procedure Laterality Date  . BREAST BIOPSY Right 2002  . BREAST SURGERY Right 2002   wide excision and sn x4 done 10/29/00 with repeat excision  done on 11/16/2000 with post operative radiation treatment and 5 years of tamoxifen with completion in 2007  . CHOLECYSTECTOMY  2002  . COLONOSCOPY  2008  . DILATION AND CURETTAGE OF UTERUS  1990,s  . ELECTROPHYSIOLOGIC STUDY N/A 03/16/2016   Procedure: CARDIOVERSION;  Surgeon: Isaias Cowman, MD;  Location: [facility] ORS;  Service: Cardiovascular;  Laterality: N/A;     A IV Location/Drains/Wounds Patient Lines/Drains/Airways Status   Active Line/Drains/Airways    Name:   Placement date:   Placement time:   Site:   Days:   Peripheral IV 09/29/18 Left Hand   09/29/18    2015    Hand   less than 1          Intake/Output Last 24 hours No intake or output data in the 24 hours ending 09/29/18 2204  Labs/Imaging Results for orders placed or performed during the hospital encounter of 09/29/18 (from the past 48 hour(s))  CBC     Status: Abnormal   Collection Time: 09/29/18  8:16 PM  Result Value Ref Range   WBC 8.9 4.0 - 10.5 K/uL   RBC 4.71 3.87 - 5.11 MIL/uL   Hemoglobin 13.1 12.0 - 15.0 g/dL   HCT 40.2 36.0 - 46.0 %   MCV 85.4 80.0 - 100.0 fL   MCH 27.8 26.0 - 34.0 pg   MCHC 32.6 30.0 - 36.0 g/dL   RDW 15.8 (H) 11.5 - 15.5 %   Platelets 294 150 - 400 K/uL   nRBC 0.0 0.0 - 0.2 %  Comment: Performed at [facility], [address] [place], [place] [address]  Comprehensive metabolic panel     Status: Abnormal   Collection Time: 09/29/18  8:16 PM  Result Value Ref Range   Sodium 136 135 - 145 mmol/L   Potassium 3.4 (L) 3.5 - 5.1 mmol/L   Chloride 102 98 - 111 mmol/L   CO2 21 (L) 22 - 32 mmol/L   Glucose, Bld 126 (H) 70 - 99 mg/dL   BUN 11 8 - 23 mg/dL   Creatinine, Ser 0.66 0.44 - 1.00 mg/dL   Calcium 9.6 8.9 - 10.3 mg/dL   Total Protein 7.3 6.5 - 8.1 g/dL   Albumin 4.7 3.5 - 5.0 g/dL   AST 27 15 - 41 U/L   ALT 15 0 - 44 U/L   Alkaline Phosphatase 93 38 - 126 U/L   Total Bilirubin 1.1 0.3 - 1.2 mg/dL   GFR calc non Af Amer >60 >60 mL/min   GFR calc Af Amer  >60 >60 mL/min   Anion gap 13 5 - 15    Comment: Performed at [facility], [place], [place] [address]  Troponin I (High Sensitivity)     Status: None   Collection Time: 09/29/18  8:16 PM  Result Value Ref Range   Troponin I (High Sensitivity) 17 <18 ng/L    Comment: (NOTE) Elevated high sensitivity troponin I (hsTnI) values and significant  changes across serial measurements may suggest ACS but many other  chronic and acute conditions are known to elevate hsTnI results.  Refer to the "Links" section for chest pain algorithms and additional  guidance. Performed at [facility], [address] [place], [place] [address]   Brain natriuretic peptide     Status: Abnormal   Collection Time: 09/29/18  8:16 PM  Result Value Ref Range   B Natriuretic Peptide 988.0 (H) 0.0 - 100.0 pg/mL    Comment: Performed at [facility], [address] [place], [place] [address]   Dg Chest 1 View  Result Date: 09/29/2018 CLINICAL DATA:  Shortness of breath, atrial fibrillation with rapid ventricular response EXAM: CHEST  1 VIEW COMPARISON:  Portable exam 2010 hours compared to 05/30/2017 FINDINGS: Enlargement of cardiac silhouette with pulmonary vascular congestion. Mediastinal contours normal. Atherosclerotic calcification aorta. RIGHT pleural effusion increased from previous exam with significant RIGHT basilar atelectasis. Underlying abnormalities of the RIGHT lower lobe not excluded. Remaining lungs clear. No pneumothorax or acute osseous findings. IMPRESSION: Enlargement of cardiac silhouette with pulmonary vascular congestion. Increased RIGHT pleural effusion and RIGHT basilar atelectasis since 05/30/2017. Electronically Signed   By: Lavonia Dana M.D.   On: 09/29/2018 20:29    Pending Labs Unresulted Labs (From admission, onward)    Start     Ordered   09/29/18 2019  SARS Coronavirus 2 (CEPHEID - Performed in [place] hospital lab), Hosp Order   (Asymptomatic Patients Labs)  Once,   STAT    Question:  Rule Out  Answer:  Yes   09/29/18 2018   Signed and Held  TSH  Add-on,   R     Signed and Held   Signed and Held  Hemoglobin A1c  Add-on,   R     Signed and Held          Vitals/Pain Today's Vitals   09/29/18 2030 09/29/18 2100 09/29/18 2130 09/29/18 2200  BP: (!) 151/105 (!) 163/95 (!) 163/115 (!) 161/118  Pulse: 92 91 (!) 52  Resp: (!) 25 (!) 26 (!) 26 (!) 29  Temp:      TempSrc:      SpO2: 92% 93% 92%   Weight:      Height:      PainSc:        Isolation Precautions No active isolations  Medications Medications  diltiazem (CARDIZEM) injection 20 mg (20 mg Intravenous Given 09/29/18 2017)  sodium chloride 0.9 % bolus 500 mL (0 mLs Intravenous Stopped 09/29/18 2204)  furosemide (LASIX) injection 40 mg (40 mg Intravenous Given 09/29/18 2156)    Mobility walks Low fall risk   Focused Assessments Cardiac Assessment Handoff:  Cardiac Rhythm: Other (Comment), Atrial fibrillation(Afib with RVR) Lab Results  Component Value Date   TROPONINI <0.03 05/12/2017   No results found for: DDIMER Does the Patient currently have chest pain? No     R Recommendations: See Admitting Provider Note  Report given to:   Additional Notes:

## 2018-09-29 NOTE — ED Notes (Signed)
Admitting Provider at bedside. 

## 2018-09-30 ENCOUNTER — Inpatient Hospital Stay (HOSPITAL_COMMUNITY)
Admit: 2018-09-30 | Discharge: 2018-09-30 | Disposition: A | Discharge status: Home / Self Care | Payer: Medicare Other | Attending: Internal Medicine | Admitting: Internal Medicine

## 2018-09-30 DIAGNOSIS — I361 Nonrheumatic tricuspid (valve) insufficiency: Secondary | ICD-10-CM

## 2018-09-30 DIAGNOSIS — I340 Nonrheumatic mitral (valve) insufficiency: Secondary | ICD-10-CM

## 2018-09-30 LAB — HEMOGLOBIN A1C
Hgb A1c MFr Bld: 5.4 % (ref 4.8–5.6)
Mean Plasma Glucose: 108.28 mg/dL

## 2018-09-30 LAB — ECHOCARDIOGRAM COMPLETE
Height: 63.5 in
Weight: 2417.6 oz

## 2018-09-30 LAB — TSH: TSH: 2.786 u[IU]/mL (ref 0.350–4.500)

## 2018-09-30 MED ORDER — ADULT MULTIVITAMIN W/MINERALS CH
1.0000 | ORAL_TABLET | Freq: Every day | ORAL | Status: DC
  Administered 2018-09-30 – 2018-10-01 (×2): 1 via ORAL
  Filled 2018-09-30 (×2): qty 1

## 2018-09-30 MED ORDER — DOCUSATE SODIUM 100 MG PO CAPS
100.0000 mg | ORAL_CAPSULE | Freq: Two times a day (BID) | ORAL | Status: DC
  Filled 2018-09-30 (×3): qty 1

## 2018-09-30 MED ORDER — POTASSIUM CHLORIDE CRYS ER 20 MEQ PO TBCR
40.0000 meq | EXTENDED_RELEASE_TABLET | ORAL | Status: AC
  Administered 2018-09-30 (×2): 40 meq via ORAL
  Filled 2018-09-30 (×2): qty 2

## 2018-09-30 MED ORDER — ONDANSETRON HCL 4 MG/2ML IJ SOLN: 4.0000 mg | Freq: Four times a day (QID) | INTRAMUSCULAR | Status: DC | PRN

## 2018-09-30 MED ORDER — ACETAMINOPHEN 325 MG PO TABS: 650.0000 mg | ORAL_TABLET | Freq: Four times a day (QID) | ORAL | Status: DC | PRN

## 2018-09-30 MED ORDER — APIXABAN 5 MG PO TABS
5.0000 mg | ORAL_TABLET | Freq: Two times a day (BID) | ORAL | Status: DC
  Administered 2018-09-30 – 2018-10-01 (×3): 5 mg via ORAL
  Filled 2018-09-30 (×3): qty 1

## 2018-09-30 MED ORDER — LISINOPRIL 10 MG PO TABS
10.0000 mg | ORAL_TABLET | Freq: Every day | ORAL | Status: DC
  Administered 2018-09-30 – 2018-10-01 (×2): 10 mg via ORAL
  Filled 2018-09-30 (×2): qty 1

## 2018-09-30 MED ORDER — ONDANSETRON HCL 4 MG PO TABS: 4.0000 mg | ORAL_TABLET | Freq: Four times a day (QID) | ORAL | Status: DC | PRN

## 2018-09-30 MED ORDER — FUROSEMIDE 10 MG/ML IJ SOLN
20.0000 mg | Freq: Two times a day (BID) | INTRAMUSCULAR | Status: DC
  Administered 2018-09-30 (×2): 20 mg via INTRAVENOUS
  Filled 2018-09-30 (×2): qty 2

## 2018-09-30 MED ORDER — ACETAMINOPHEN 650 MG RE SUPP: 650.0000 mg | Freq: Four times a day (QID) | RECTAL | Status: DC | PRN

## 2018-09-30 NOTE — Consult Note (Signed)
[facility] Cardiology  CARDIOLOGY CONSULT NOTE  Patient ID: Norma Hill MRN: [id number] DOB/AGE: 75-Oct-1947 75 y.o.  Admit date: 09/29/2018 Referring Physician Marcille Blanco Primary Physician [facility] Primary Cardiologist Dr. Dwyane Dee, [facility] Reason for Consultation atrial fibrillation with RVR, acute on chronic CHF  HPI: 73 year old female referred for evaluation of atrial fibrillation with RVR and acute on chronic CHF. 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 The patient reports no recent changes in her overall health, new medication changes, or diet changes. She reports a 2-day history of cough, palpitations and heart racing with associated shortness of breath, without chest pain, presyncope, syncope, orthopnea, or increased peripheral edema from her baseline. She presented to [facility] ER for evaluation where ECG revealed atrial fibrillation at a rate of 156 bpm with nonspecific ST-T wave abnormalities, similar to previous ECGs, with no evidence of ischemia, with QTc 380 ms. Her blood pressure was elevated to 180/100 at times. Labs were noted for COVID-19 negative, potassium 3.4, BNP 988, normal TSH, normal hs-troponin. Chest xray revealed pulmonary vascular congestion, increased right pleural effusion and right basilar atelectasis. The patient received a total of 25 mg of IV diltiazem with initial improvement of rate, and IV Lasix with diuresis. Currently, she reports feeling better. She denies palpitations, shortness of breath, dizziness, or chest pain. She reports compliance with her Eliquis, but does not think she has been taking Cardizem.  Review of systems complete and found to be negative unless listed above      Past Medical History:  Diagnosis Date  . A-fib ([place])   . Bowel trouble 2012   ocasionally  . Cancer ([place]) 10/2000   pt had 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 Pt then had a repeat excision on 11/16/2000  . Hypertension 2006  . Personal history of malignant neoplasm of breast 2002  . Solitary cyst of breast 2012  . Special screening for malignant neoplasms, colon     Past Surgical History:  Procedure Laterality Date  . BREAST BIOPSY Right 2002  . BREAST SURGERY Right 2002   wide excision and sn x4 done 10/29/00 with repeat excision done on 11/16/2000 with post operative radiation treatment and 5 years of tamoxifen with completion in 2007  . CHOLECYSTECTOMY  2002  . COLONOSCOPY  2008  . DILATION AND CURETTAGE OF UTERUS  1990,s  . ELECTROPHYSIOLOGIC STUDY N/A 03/16/2016   Procedure: CARDIOVERSION;  Surgeon: Isaias Cowman, MD;  Location: [facility] ORS;  Service: Cardiovascular;  Laterality: N/A;    Medications Prior to Admission  Medication Sig Dispense Refill Last Dose  . apixaban (ELIQUIS) 5 MG TABS tablet Take 1 tablet (5 mg total) by mouth 2 (two) times daily. 60 tablet 0 09/29/2018 at 0830   Social History   Socioeconomic History  . Marital status: Married    Spouse name: Not on file  . Number of children: Not on file  . Years of education: Not on file  . Highest education level: Not on file  Occupational History  . Not on file  Social Needs  . Financial resource strain: Not on file  . Food insecurity    Worry: Not on file    Inability: Not on file  . Transportation needs  Medical: Not on file    Non-medical: Not on file  Tobacco Use  . Smoking status: Never Smoker  . Smokeless tobacco: Never Used  Substance and Sexual Activity  . Alcohol use: No  . Drug use: No  . Sexual activity: Not on file  Lifestyle  . Physical activity    Days per week: Not on file    Minutes per session: Not on file  . Stress: Not  on file  Relationships  . Social Herbalist on phone: Not on file    Gets together: Not on file    Attends religious service: Not on file    Active member of club or organization: Not on file    Attends meetings of clubs or organizations: Not on file    Relationship status: Not on file  . Intimate partner violence    Fear of current or ex partner: Not on file    Emotionally abused: Not on file    Physically abused: Not on file    Forced sexual activity: Not on file  Other Topics Concern  . Not on file  Social History Narrative  . Not on file    Family History  Problem Relation Age of Onset  . Cancer Other        breast, relationship not listed      Review of systems complete and found to be negative unless listed above      PHYSICAL EXAM  General: Well developed, well nourished, in no acute distress HEENT:  Normocephalic and atramatic Neck:  No JVD.  Lungs: Normal effort of breathing on room air, bibasilar crackles, no wheezing Heart: irregularly irregular Abdomen: nondistended Msk:  Back normal, gait not assessed. Normal strength and tone for age. Extremities: No clubbing, cyanosis, with trace bilateral lower extremity edema.   Neuro: Alert and oriented X 3. Psych:  Good affect, responds appropriately  Labs:   Lab Results  Component Value Date   WBC 8.9 09/29/2018   HGB 13.1 09/29/2018   HCT 40.2 09/29/2018   MCV 85.4 09/29/2018   PLT 294 09/29/2018    Recent Labs  Lab 09/29/18 2016  NA 136  K 3.4*  CL 102  CO2 21*  BUN 11  CREATININE 0.66  CALCIUM 9.6  PROT 7.3  BILITOT 1.1  ALKPHOS 93  ALT 15  AST 27  GLUCOSE 126*   Lab Results  Component Value Date   TROPONINI <0.03 05/12/2017    Lab Results  Component Value Date   CHOL 169 02/10/2016   Lab Results  Component Value Date   HDL 59 02/10/2016   Lab Results  Component Value Date   LDLCALC 100 (H) 02/10/2016   Lab Results  Component Value Date   TRIG 50 02/10/2016    Lab Results  Component Value Date   CHOLHDL 2.9 02/10/2016   No results found for: LDLDIRECT    Radiology: Dg Chest 1 View  Result Date: 09/29/2018 CLINICAL DATA:  Shortness of breath, atrial fibrillation with rapid ventricular response EXAM: CHEST  1 VIEW COMPARISON:  Portable exam 2010 hours compared to 05/30/2017 FINDINGS: Enlargement of cardiac silhouette with pulmonary vascular congestion. Mediastinal contours normal. Atherosclerotic calcification aorta. RIGHT pleural effusion increased from previous exam with significant RIGHT basilar atelectasis. Underlying abnormalities of the RIGHT lower lobe not excluded. Remaining lungs clear. No pneumothorax or acute osseous findings. IMPRESSION: Enlargement of cardiac silhouette with pulmonary vascular congestion. Increased RIGHT pleural effusion and RIGHT basilar atelectasis since 05/30/2017.  Electronically Signed   By: Lavonia Dana M.D.   On: 09/29/2018 20:29    EKG: atrial fibrillation, rate 90s  ASSESSMENT AND PLAN:  1. Atrial fibrillation with RVR, with history of persistent atrial fibrillation, status post unsuccessful cardioversion and failed cryoablation at [facility] in 2018, on Eliquis. The patient states she has not been taking Cardizem. Admission labs unremarkable, including hs-troponin and TSH, with mild hypokalemia K 3.4 2. Acute on chronic systolic CHF with BNP 093. Received IV Lasix with diuresis. 3. Pleural effusion, noted on CT in 2017, admitted 05/2017 in rapid atrial fibrillation with right pleural effusion present, status post thoracentesis 1.2 L. She is followed by pulmonology as outpatient. Pleural effusion of uncertain significance. 4. Hypertension, elevated on arrival to ER, improved this morning, on lisinopril and Cardizem 5. Hypokalemia with K 3.4, receiving supplementation  6. History of syncope and 3-second pause 7. History of flecainide toxicity  Plan: 1. Agree with current therapy 2. Continue Eliquis 5 mg BID for stroke  prevention 3. Resume and continue Cardizem CD 300 mg once daily 4. Continue IV Lasix 20 mg BID with careful monitoring of electrolytes and renal status 5. 2D echocardiogram 6. Consider thoracentesis with increased pleural effusion, status post thoracentesis with 1.2 L in 2019.  7. Further recommendations pending results of echocardiogram and patient's initial course.   Signed: Clabe Seal PA-C 09/30/2018, 7:54 AM  The patient's past and recent history were discussed with Dr. Saralyn Pilar who also evaluated the patient, and the plan was made in collaboration with him.

## 2018-09-30 NOTE — Progress Notes (Signed)
[place] [facility] [place] NAME: Norma Hill    MR#:  [id number]  DATE OF BIRTH:  05-30-1945  SUBJECTIVE:  CHIEF COMPLAINT:   Chief Complaint  Patient presents with  . Shortness of Breath   -Feels much better today.  Breathing has improved.  Not on supplemental oxygen -Remains in A. fib.  Heart rate is well controlled  REVIEW OF SYSTEMS:  Review of Systems  Constitutional: Negative for chills, fever and malaise/fatigue.  HENT: Negative for congestion, ear discharge, hearing loss and nosebleeds.   Eyes: Negative for blurred vision and double vision.  Respiratory: Positive for shortness of breath. Negative for cough and wheezing.   Cardiovascular: Positive for palpitations. Negative for chest pain and leg swelling.  Gastrointestinal: Negative for abdominal pain, constipation, diarrhea, nausea and vomiting.  Genitourinary: Negative for dysuria.  Musculoskeletal: Negative for myalgias.  Neurological: Negative for dizziness, focal weakness, seizures, weakness and headaches.  Psychiatric/Behavioral: Negative for depression.    DRUG ALLERGIES:  No Known Allergies  VITALS:  Blood pressure (!) 145/85, pulse 98, temperature 97.8 F (36.6 C), temperature source Oral, resp. rate 19, height 5' 3.5" (1.613 m), weight 68.5 kg, SpO2 95 %.  PHYSICAL EXAMINATION:  Physical Exam   GENERAL:  73 y.o.-year-old patient lying in the bed with no acute distress.  EYES: Pupils equal, round, reactive to light and accommodation. No scleral icterus. Extraocular muscles intact.  HEENT: Head atraumatic, normocephalic. Oropharynx and nasopharynx clear.  NECK:  Supple, no jugular venous distention. No thyroid enlargement, no tenderness.  LUNGS: Normal breath sounds bilaterally, no wheezing, rales,rhonchi or crepitation. No use of accessory muscles of respiration.  CARDIOVASCULAR: S1, S2 normal. No murmurs, rubs, or gallops.  ABDOMEN: Soft, nontender, nondistended.  Bowel sounds present. No organomegaly or mass.  EXTREMITIES: No pedal edema, cyanosis, or clubbing.  NEUROLOGIC: Cranial nerves II through XII are intact. Muscle strength 5/5 in all extremities. Sensation intact. Gait not checked.  PSYCHIATRIC: The patient is alert and oriented x 3.  SKIN: No obvious rash, lesion, or ulcer.    LABORATORY PANEL:   CBC Recent Labs  Lab 09/29/18 2016  WBC 8.9  HGB 13.1  HCT 40.2  PLT 294   ------------------------------------------------------------------------------------------------------------------  Chemistries  Recent Labs  Lab 09/29/18 2016  NA 136  K 3.4*  CL 102  CO2 21*  GLUCOSE 126*  BUN 11  CREATININE 0.66  CALCIUM 9.6  AST 27  ALT 15  ALKPHOS 93  BILITOT 1.1   ------------------------------------------------------------------------------------------------------------------  Cardiac Enzymes No results for input(s): TROPONINI in the last 168 hours. ------------------------------------------------------------------------------------------------------------------  RADIOLOGY:  Dg Chest 1 View  Result Date: 09/29/2018 CLINICAL DATA:  Shortness of breath, atrial fibrillation with rapid ventricular response EXAM: CHEST  1 VIEW COMPARISON:  Portable exam 2010 hours compared to 05/30/2017 FINDINGS: Enlargement of cardiac silhouette with pulmonary vascular congestion. Mediastinal contours normal. Atherosclerotic calcification aorta. RIGHT pleural effusion increased from previous exam with significant RIGHT basilar atelectasis. Underlying abnormalities of the RIGHT lower lobe not excluded. Remaining lungs clear. No pneumothorax or acute osseous findings. IMPRESSION: Enlargement of cardiac silhouette with pulmonary vascular congestion. Increased RIGHT pleural effusion and RIGHT basilar atelectasis since 05/30/2017. Electronically Signed   By: Lavonia Dana M.D.   On: 09/29/2018 20:29    EKG:   Orders placed or performed during the  hospital encounter of 09/29/18  . EKG 12-Lead  . EKG 12-Lead  . ED EKG  . ED EKG  . EKG 12-Lead  .  EKG 12-Lead    ASSESSMENT AND PLAN:   73 year old female with past medical history significant for persistent atrial fibrillation failed cardioversion and cryoablation in the past, hypertension, history of syncope presents to hospital secondary to worsening shortness of breath and palpitations.  1.  Atrial fibrillation with RVR-known history of persistent A. fib-failed cardioversion and cryoablation in the past in 2018.  Had reaction to flecainide. -Concern for noncompliance with Cardizem at home.  Restarted oral Cardizem now.  Received IV Cardizem push in the ED.  TSH is normal -Troponin is normal Appreciate cardiology consult.  Echocardiogram is pending. -Already on Eliquis for anticoagulation.  2.  Acute on chronic CHF exacerbation-IV Lasix twice daily.  Follow-up echocardiogram -Has right pleural effusion which has increased since prior, however appears very comfortable, no indication for tap at this time.  3.  Hypokalemia-being replaced.  Monitor while on Lasix  4.  Hypertension-on low-dose lisinopril and also on Cardizem  5.  DVT prophylaxis-on Eliquis  Patient is independent and ambulatory at baseline   All the records are reviewed and case discussed with Care Management/Social Workerr. Management plans discussed with the patient, family and they are in agreement.  CODE STATUS: Full Code  TOTAL TIME TAKING CARE OF THIS PATIENT: 38 minutes.   POSSIBLE D/C IN 1-2 DAYS, DEPENDING ON CLINICAL CONDITION.   Gladstone Lighter M.D on 09/30/2018 at 2:50 PM  Between 7am to 6pm - Pager - [phone]  After 6pm go to [email or web address] - password [email or web address] [facility]  Office  [phone]  CC: Primary care physician; Guadalupe Maple, MD

## 2018-09-30 NOTE — Progress Notes (Signed)
*  PRELIMINARY RESULTS* Echocardiogram 2D Echocardiogram has been performed.  Sherrie Sport 09/30/2018, 10:13 AM

## 2018-10-01 LAB — BASIC METABOLIC PANEL
Anion gap: 8 (ref 5–15)
BUN: 13 mg/dL (ref 8–23)
CO2: 28 mmol/L (ref 22–32)
Calcium: 8.9 mg/dL (ref 8.9–10.3)
Chloride: 104 mmol/L (ref 98–111)
Creatinine, Ser: 0.73 mg/dL (ref 0.44–1.00)
GFR calc Af Amer: >60 mL/min (ref >60)
GFR calc non Af Amer: >60 mL/min (ref >60)
Glucose, Bld: 99 mg/dL (ref 70–99)
Potassium: 2.9 mmol/L — ABNORMAL LOW (ref 3.5–5.1)
Sodium: 140 mmol/L (ref 135–145)

## 2018-10-01 LAB — POTASSIUM: Potassium: 3.4 mmol/L — ABNORMAL LOW (ref 3.5–5.1)

## 2018-10-01 LAB — MAGNESIUM: Magnesium: 1.7 mg/dL (ref 1.7–2.4)

## 2018-10-01 MED ORDER — RISAQUAD PO CAPS
1.0000 | ORAL_CAPSULE | Freq: Every day | ORAL | Status: DC
  Administered 2018-10-01: 1 via ORAL
  Filled 2018-10-01: qty 1

## 2018-10-01 MED ORDER — MAGNESIUM SULFATE 2 GM/50ML IV SOLN
2.0000 g | Freq: Once | INTRAVENOUS | Status: AC
  Administered 2018-10-01: 2 g via INTRAVENOUS
  Filled 2018-10-01: qty 50

## 2018-10-01 MED ORDER — RISAQUAD PO CAPS: 1.0000 | ORAL_CAPSULE | Freq: Every day | ORAL | 3 refills | Status: DC

## 2018-10-01 MED ORDER — POTASSIUM CHLORIDE CRYS ER 20 MEQ PO TBCR
40.0000 meq | EXTENDED_RELEASE_TABLET | ORAL | Status: AC
  Administered 2018-10-01 (×3): 40 meq via ORAL
  Filled 2018-10-01 (×3): qty 2

## 2018-10-01 MED ORDER — DILTIAZEM HCL ER COATED BEADS 300 MG PO CP24: 300.0000 mg | ORAL_CAPSULE | Freq: Every day | ORAL | 3 refills | Status: DC

## 2018-10-01 MED ORDER — APIXABAN 5 MG PO TABS: 5.0000 mg | ORAL_TABLET | Freq: Two times a day (BID) | ORAL | 3 refills | Status: DC

## 2018-10-01 NOTE — Progress Notes (Signed)
Cardiovascular and Pulmonary Nurse Navigator Note:   73 year old female with PMHx significant for atrial fibrillation who has failed cardioversion and ablation in the past (2018), HTN, history of syncope who presented to hospital secondary to worsening SOB and palpitations.    Active Problem List:   1. Atrial Fibrillation with RVR.  Patient was previously on Cardizem 360 mg daily at home, but stopped taking this medication as she did not realize she was supposed to continue.  This admission patient was restarted on oral Cardizem with better hear rate control.  Patient is followed by [facility] and will follow-up with her cardiologists upon discharge.   2. Acute on Chronic CHF exacerbation - patient received IV Lasix here.  It is felt the exacerbation is due to increased heart rate.  Lasix is being discontinued at discharge.  Echocardiogram this admission showing low normal systolic function, EF of 50 to 55%, LVH noted.  Mildly elevated right ventricular pressures.  Severely dilated left atrium noted.  Low sodium diet and fluid restriction per MD. 3.  Right pleural effusion which has increased in size, but not enough to tap per MD.   4. Hypokalemia - potassium being replaced per MD orders.  5. Hypertension - patient on low dose lisinopril and now back on Cardizem.  Patient informed this RN that she needs prescriptions for Eliquis, Lisinopril, and Cardizem. Patient's assigned RN informed, as there is no discharge order at the time of this note.     CHF Education:?? Educational session with patient completed.  Reviewed "Living Better with Heart Failure" packet that patient had at bedside.   Briefly reviewed definition of heart failure and signs and symptoms of an exacerbation.?Discussed potential causes of CHF.  Explained to patient that HF is a chronic illness which requires self-assessment / self-management along with help from the cardiologist/PCP.Discussed definition of EF measurement along with  normal value compared to patient's EF of 50 - 55%.    *Reviewed importance of and reason behind checking weight daily in the AM, after using the bathroom, but before getting dressed.  Patient has a functioning scale.  Patient has not been weighing herself daily.     *Reviewed with patient the following information: *Discussed when to call the Dr= weight gain of >2-3lb overnight of 5lb in a week, *Discussed yellow zone= call MD: weight gain of >2-3lb overnight of 5lb in a week, increased swelling, increased SOB when lying down, chest discomfort, dizziness, increased fatigue *Red Zone= call 911: struggle to breath, fainting or near fainting, significant chest pain ? *Heart Failure Zone Magnet given and reviewed with patient.   *Diet - Patient currently ordered Heart Healthy Diet.  Instructed patient to follow a low sodium diet of 2000 mg or less.  Recommended foods for low sodium heart healthy diet.    Reviewed with patient steps to reading a food label with close attention to serving size and mg of sodium.   *Discussed fluid intake with patient as well. Patient not currently on an ordered fluid restriction, but advised no more than 64 ounces of fluid per day.  Demonstrated this volume to patient using the bedside water pitcher.   *Instructed patient to take medications as prescribed for heart failure. Explained briefly why patient is on the medications (either make you feel better, live longer or keep you out of the hospital) and discussed monitoring and side effects.  As indicated above, patient stated she needs prescription refills for Eliquis, Lisinopril, and Cardizem.  Assigned RN informed.    *  Discussed exercise / activity. Patient stated she has not been as active recently due to SOB.  Encouraged patient to remain as active as possible.    *Smoking Cessation- Patient is a NEVER smoker.     *[facility] - Patient is followed by [facility].  Patient to make  appointment with her cardiologist post discharge.    Again, the 5 Steps to Living Better with Heart Failure were reviewed with patient.  Patient thanked me for providing the above information. ? Attempted to obtain ReDS Clip reading x 2  on this patient, but was unable to do so secondary to "poor quality."    Roanna Epley, RN, BSN, [facility]  Cardiovascular & Pulmonary Nurse Navigator  Direct Line: [phone]  Department Phone #: [phone] Fax: [phone]  Email Address: [email or web address]

## 2018-10-01 NOTE — Plan of Care (Signed)
Problem: Education: Goal: Knowledge of General Education information will improve Description: Including pain rating scale, medication(s)/side effects and non-pharmacologic comfort measures 10/01/2018 1552 by Alen Blew, RN Outcome: Completed/Met 10/01/2018 1551 by Alen Blew, RN Outcome: Adequate for Discharge   Problem: Health Behavior/Discharge Planning: Goal: Ability to manage health-related needs will improve 10/01/2018 1552 by Alen Blew, RN Outcome: Completed/Met 10/01/2018 1551 by Alen Blew, RN Outcome: Adequate for Discharge   Problem: Clinical Measurements: Goal: Ability to maintain clinical measurements within normal limits will improve 10/01/2018 1552 by Alen Blew, RN Outcome: Completed/Met 10/01/2018 1551 by Alen Blew, RN Outcome: Adequate for Discharge Goal: Will remain free from infection 10/01/2018 1552 by Alen Blew, RN Outcome: Completed/Met 10/01/2018 1551 by Alen Blew, RN Outcome: Adequate for Discharge Goal: Diagnostic test results will improve 10/01/2018 1552 by Alen Blew, RN Outcome: Completed/Met 10/01/2018 1551 by Alen Blew, RN Outcome: Adequate for Discharge Goal: Respiratory complications will improve 10/01/2018 1552 by Alen Blew, RN Outcome: Completed/Met 10/01/2018 1551 by Alen Blew, RN Outcome: Adequate for Discharge Goal: Cardiovascular complication will be avoided 10/01/2018 1552 by Alen Blew, RN Outcome: Completed/Met 10/01/2018 1551 by Alen Blew, RN Outcome: Adequate for Discharge   Problem: Activity: Goal: Risk for activity intolerance will decrease 10/01/2018 1552 by Roanna Epley D, RN Outcome: Completed/Met 10/01/2018 1551 by Alen Blew, RN Outcome: Adequate for Discharge   Problem: Nutrition: Goal: Adequate nutrition will be maintained 10/01/2018 1552 by Alen Blew, RN Outcome: Completed/Met 10/01/2018 1551 by Alen Blew,  RN Outcome: Adequate for Discharge   Problem: Coping: Goal: Level of anxiety will decrease 10/01/2018 1552 by Alen Blew, RN Outcome: Completed/Met 10/01/2018 1551 by Alen Blew, RN Outcome: Adequate for Discharge   Problem: Elimination: Goal: Will not experience complications related to bowel motility 10/01/2018 1552 by Alen Blew, RN Outcome: Completed/Met 10/01/2018 1551 by Alen Blew, RN Outcome: Adequate for Discharge Goal: Will not experience complications related to urinary retention 10/01/2018 1552 by Alen Blew, RN Outcome: Completed/Met 10/01/2018 1551 by Alen Blew, RN Outcome: Adequate for Discharge   Problem: Pain Managment: Goal: General experience of comfort will improve 10/01/2018 1552 by Alen Blew, RN Outcome: Completed/Met 10/01/2018 1551 by Alen Blew, RN Outcome: Adequate for Discharge   Problem: Safety: Goal: Ability to remain free from injury will improve 10/01/2018 1552 by Roanna Epley D, RN Outcome: Completed/Met 10/01/2018 1551 by Alen Blew, RN Outcome: Adequate for Discharge   Problem: Skin Integrity: Goal: Risk for impaired skin integrity will decrease 10/01/2018 1552 by Alen Blew, RN Outcome: Completed/Met 10/01/2018 1551 by Alen Blew, RN Outcome: Adequate for Discharge   Problem: Education: Goal: Knowledge of disease or condition will improve 10/01/2018 1552 by Alen Blew, RN Outcome: Completed/Met 10/01/2018 1551 by Alen Blew, RN Outcome: Adequate for Discharge Goal: Understanding of medication regimen will improve 10/01/2018 1552 by Alen Blew, RN Outcome: Completed/Met 10/01/2018 1551 by Alen Blew, RN Outcome: Adequate for Discharge Goal: Individualized Educational Video(s) 10/01/2018 1552 by Alen Blew, RN Outcome: Completed/Met 10/01/2018 1551 by Alen Blew, RN Outcome: Adequate for Discharge   Problem: Activity: Goal: Ability to  tolerate increased activity will improve 10/01/2018 1552 by Roanna Epley D, RN Outcome: Completed/Met 10/01/2018 1551 by Alen Blew, RN Outcome: Adequate for Discharge   Problem: Cardiac: Goal: Ability to achieve and maintain adequate cardiopulmonary perfusion will improve 10/01/2018 1552 by  Roanna Epley D, RN Outcome: Completed/Met 10/01/2018 1551 by Alen Blew, RN Outcome: Adequate for Discharge   Problem: Health Behavior/Discharge Planning: Goal: Ability to safely manage health-related needs after discharge will improve 10/01/2018 1552 by Alen Blew, RN Outcome: Completed/Met 10/01/2018 1551 by Alen Blew, RN Outcome: Adequate for Discharge   Problem: Education: Goal: Ability to demonstrate management of disease process will improve 10/01/2018 1552 by Alen Blew, RN Outcome: Completed/Met 10/01/2018 1551 by Alen Blew, RN Outcome: Adequate for Discharge Goal: Ability to verbalize understanding of medication therapies will improve 10/01/2018 1552 by Alen Blew, RN Outcome: Completed/Met 10/01/2018 1551 by Alen Blew, RN Outcome: Adequate for Discharge Goal: Individualized Educational Video(s) 10/01/2018 1552 by Alen Blew, RN Outcome: Completed/Met 10/01/2018 1551 by Alen Blew, RN Outcome: Adequate for Discharge   Problem: Activity: Goal: Capacity to carry out activities will improve 10/01/2018 1552 by Alen Blew, RN Outcome: Completed/Met 10/01/2018 1551 by Alen Blew, RN Outcome: Adequate for Discharge   Problem: Cardiac: Goal: Ability to achieve and maintain adequate cardiopulmonary perfusion will improve 10/01/2018 1552 by Alen Blew, RN Outcome: Completed/Met 10/01/2018 1551 by Alen Blew, RN Outcome: Adequate for Discharge

## 2018-10-01 NOTE — Discharge Summary (Signed)
[facility] NAME: Norma Hill    MR#:  [id number]  DATE OF BIRTH:  [birth year]  DATE OF ADMISSION:  09/29/2018   ADMITTING PHYSICIAN: Harrie Foreman, MD  DATE OF DISCHARGE: 10/01/18  PRIMARY CARE PHYSICIAN: Guadalupe Maple, MD   ADMISSION DIAGNOSIS:   Pleural effusion [J90] Persistent atrial fibrillation [I48.19]  DISCHARGE DIAGNOSIS:   Active Problems:   Atrial fibrillation with RVR ([place])   SECONDARY DIAGNOSIS:   Past Medical History:  Diagnosis Date   A-fib [facility])    Bowel trouble 2012   ocasionally   Cancer ([place]) 10/2000   pt had 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 Pt then had a repeat excision on 11/16/2000   Hypertension 2006   Personal history of malignant neoplasm of breast 2002   Solitary cyst of breast 2012   Special screening for malignant neoplasms, colon     HOSPITAL COURSE:   73 year old female with past medical history significant for persistent atrial fibrillation failed cardioversion and cryoablation in the past, hypertension, history of syncope presents to hospital secondary to worsening shortness of breath and palpitations.  1.  Atrial fibrillation with RVR-known history of persistent A. fib-failed cardioversion and cryoablation in the past in 2018.  Had reaction to flecainide. -Patient was on Cardizem 360 mg daily at home, has not been taking it lately as she did not know that she was supposed to continue that.  Restarted on oral Cardizem with better heart rate control. -She follows with [facility] cardiology, had a Holter monitor in the past that showed a 3-second pause.  Discussion about pacemaker was initiated during her last office visit, however patient continues to refuse at this time.  She agrees to follow-up with her cardiologist after discharge. - started on 300mg  cardizem orally here and has optimal rate control even with exertion-  discharge on the same Had extensive discussion with her husband over the phone who is really worried about her and did not want her to be discharged before she is better.  However patient today mentions that she is feeling great and wants to go home.  Patient did not give me permission to talk to her husband today as she feels like he worries a lot and she will talk to him.  Ashly, RN was at bedside during this conversation. - TSH is normal -Troponin is normal Appreciate cardiology consult.  Echocardiogram this admission showing low normal systolic function, EF of 50 to 55%, LVH noted.  Mildly elevated right ventricular pressures.  Severely dilated left atrium noted. - on Eliquis for anticoagulation.  2.  Acute on chronic diastolic CHF exacerbation-received IV Lasix here. - secondary to elevated heart rate on admission - Will discontinue Lasix at discharge.  Just advised a low-sodium diet and fluid restriction as needed -Has right pleural effusion which has slightly increased since prior, however patient's breathing appears very comfortable, no indication for tap at this time. -Will need outpatient follow-up  3.  Hypokalemia-replaced prior to discharge  4.  Hypertension-on Cardizem   Patient is independent and ambulatory at baseline Patient is very eager to go home today.   discharge later today  DISCHARGE CONDITIONS:   Guarded  CONSULTS OBTAINED:   Cardiology consultation by Dr. Saralyn Pilar  DRUG ALLERGIES:   No Known Allergies DISCHARGE MEDICATIONS:   Allergies as of 10/01/2018   No Known Allergies     Medication List    TAKE these  medications   acidophilus Caps capsule Take 1 capsule by mouth daily. Start taking on: October 02, 2018   apixaban 5 MG Tabs tablet Commonly known as: ELIQUIS Take 1 tablet (5 mg total) by mouth 2 (two) times daily.   diltiazem 300 MG 24 hr capsule Commonly known as: CARDIZEM CD Take 1 capsule (300 mg total) by mouth daily.         DISCHARGE INSTRUCTIONS:   1. PCP f/u in 1-2 weeks 2. Cardiology f/u in 1 week 3. Outpatient GI f/u in 2 weeks  DIET:   Cardiac diet  ACTIVITY:   Activity as tolerated  OXYGEN:   Home Oxygen: No.  Oxygen Delivery: room air  DISCHARGE LOCATION:   home   If you experience worsening of your admission symptoms, develop shortness of breath, life threatening emergency, suicidal or homicidal thoughts you must seek medical attention immediately by calling 911 or calling your MD immediately  if symptoms less severe.  You Must read complete instructions/literature along with all the possible adverse reactions/side effects for all the Medicines you take and that have been prescribed to you. Take any new Medicines after you have completely understood and accpet all the possible adverse reactions/side effects.   Please note  You were cared for by a hospitalist during your hospital stay. If you have any questions about your discharge medications or the care you received while you were in the hospital after you are discharged, you can call the unit and asked to speak with the hospitalist on call if the hospitalist that took care of you is not available. Once you are discharged, your primary care physician will handle any further medical issues. Please note that NO REFILLS for any discharge medications will be authorized once you are discharged, as it is imperative that you return to your primary care physician (or establish a relationship with a primary care physician if you do not have one) for your aftercare needs so that they can reassess your need for medications and monitor your lab values.    On the day of Discharge:  VITAL SIGNS:   Blood pressure (!) 157/85, pulse 89, temperature 97.9 F (36.6 C), temperature source Oral, resp. rate 19, height 5' 3.5" (1.613 m), weight 66.1 kg, SpO2 93 %.  PHYSICAL EXAMINATION:    GENERAL:  73 y.o.-year-old patient lying in the bed with no  acute distress.  EYES: Pupils equal, round, reactive to light and accommodation. No scleral icterus. Extraocular muscles intact.  HEENT: Head atraumatic, normocephalic. Oropharynx and nasopharynx clear.  NECK:  Supple, no jugular venous distention. No thyroid enlargement, no tenderness.  LUNGS: Normal breath sounds bilaterally, no wheezing, rales,rhonchi or crepitation. No use of accessory muscles of respiration.  CARDIOVASCULAR: S1, S2 normal. No murmurs, rubs, or gallops.  ABDOMEN: Soft, nontender, nondistended. Bowel sounds present. No organomegaly or mass.  EXTREMITIES: No pedal edema, cyanosis, or clubbing.  NEUROLOGIC: Cranial nerves II through XII are intact. Muscle strength 5/5 in all extremities. Sensation intact. Gait not checked.  PSYCHIATRIC: The patient is alert and oriented x 3.  SKIN: No obvious rash, lesion, or ulcer.     DATA REVIEW:   CBC Recent Labs  Lab 09/29/18 2016  WBC 8.9  HGB 13.1  HCT 40.2  PLT 294    Chemistries  Recent Labs  Lab 09/29/18 2016 10/01/18 0250 10/01/18 1337  NA 136 140  --   K 3.4* 2.9* 3.4*  CL 102 104  --   CO2 21* 28  --  GLUCOSE 126* 99  --   BUN 11 13  --   CREATININE 0.66 0.73  --   CALCIUM 9.6 8.9  --   MG  --  1.7  --   AST 27  --   --   ALT 15  --   --   ALKPHOS 93  --   --   BILITOT 1.1  --   --      Microbiology Results  Results for orders placed or performed during the hospital encounter of 09/29/18  SARS Coronavirus 2 (CEPHEID - Performed in [place] hospital lab), Hosp Order     Status: None   Collection Time: 09/29/18  8:16 PM   Specimen: Nasopharyngeal Swab  Result Value Ref Range Status   SARS Coronavirus 2 NEGATIVE NEGATIVE Final    Comment: (NOTE) If result is NEGATIVE SARS-CoV-2 target nucleic acids are NOT DETECTED. The SARS-CoV-2 RNA is generally detectable in upper and lower  respiratory specimens during the acute phase of infection. The lowest  concentration of SARS-CoV-2 viral copies this  assay can detect is 250  copies / mL. A negative result does not preclude SARS-CoV-2 infection  and should not be used as the sole basis for treatment or other  patient management decisions.  A negative result may occur with  improper specimen collection / handling, submission of specimen other  than nasopharyngeal swab, presence of viral mutation(s) within the  areas targeted by this assay, and inadequate number of viral copies  (<250 copies / mL). A negative result must be combined with clinical  observations, patient history, and epidemiological information. If result is POSITIVE SARS-CoV-2 target nucleic acids are DETECTED. The SARS-CoV-2 RNA is generally detectable in upper and lower  respiratory specimens dur ing the acute phase of infection.  Positive  results are indicative of active infection with SARS-CoV-2.  Clinical  correlation with patient history and other diagnostic information is  necessary to determine patient infection status.  Positive results do  not rule out bacterial infection or co-infection with other viruses. If result is PRESUMPTIVE POSTIVE SARS-CoV-2 nucleic acids MAY BE PRESENT.   A presumptive positive result was obtained on the submitted specimen  and confirmed on repeat testing.  While 2019 novel coronavirus  (SARS-CoV-2) nucleic acids may be present in the submitted sample  additional confirmatory testing may be necessary for epidemiological  and / or clinical management purposes  to differentiate between  SARS-CoV-2 and other Sarbecovirus currently known to infect humans.  If clinically indicated additional testing with an alternate test  methodology [phone]) is advised. The SARS-CoV-2 RNA is generally  detectable in upper and lower respiratory sp ecimens during the acute  phase of infection. The expected result is Negative. Fact Sheet for Patients:  StrictlyIdeas.no Fact Sheet for Healthcare  Providers: BankingDealers.co.za This test is not yet approved or cleared by the [place] FDA and has been authorized for detection and/or diagnosis of SARS-CoV-2 by FDA under an Emergency Use Authorization (EUA).  This EUA will remain in effect (meaning this test can be used) for the duration of the COVID-19 declaration under Section 564(b)(1) of the Act, 21 U.S.C. section 360bbb-3(b)(1), unless the authorization is terminated or revoked sooner. Performed at [facility], [address], [place], [place] [address]     RADIOLOGY:  No results found.   Management plans discussed with the patient, family and they are in agreement.  CODE STATUS:     Code Status Orders  (From admission, onward)  Start     Ordered   09/30/18 0017  Full code  Continuous     09/30/18 0017        Code Status History    Date Active Date Inactive Code Status Order ID Comments User Context   05/12/2017 2014 05/16/2017 1749 Full Code [id number]  Dustin Flock, MD Inpatient   02/10/2016 0052 02/11/2016 1603 Full Code [id number]  Hugelmeyer, Ubaldo Glassing, DO Inpatient   Advance Care Planning Activity    Advance Directive Documentation     Most Recent Value  Type of Advance Directive  Healthcare Power of Attorney  Pre-existing out of facility DNR order (yellow form or pink MOST form)  --  "MOST" Form in Place?  --      TOTAL TIME TAKING CARE OF THIS PATIENT: 38 minutes.    Gladstone Lighter M.D on 10/01/2018 at 3:54 PM  Between 7am to 6pm - Pager - [phone]  After 6pm go to [email or web address] - Proofreader  [facility]  Office  [phone]  CC: Primary care physician; Guadalupe Maple, MD   Note: This dictation was prepared with Dragon dictation along with smaller phrase technology. Any transcriptional errors that result from this process are unintentional.

## 2018-10-01 NOTE — Progress Notes (Signed)
Patient discharged home with prescription and follow up information, EMMI video on fluid restriction and what is Heart failure viewed along with Living Better With Heart Failure book, and quiz. No concerns at this time.

## 2018-10-01 NOTE — Plan of Care (Signed)
  Problem: Education: Goal: Knowledge of General Education information will improve Description: Including pain rating scale, medication(s)/side effects and non-pharmacologic comfort measures Outcome: Adequate for Discharge   Problem: Health Behavior/Discharge Planning: Goal: Ability to manage health-related needs will improve Outcome: Adequate for Discharge   Problem: Clinical Measurements: Goal: Ability to maintain clinical measurements within normal limits will improve Outcome: Adequate for Discharge Goal: Will remain free from infection Outcome: Adequate for Discharge Goal: Diagnostic test results will improve Outcome: Adequate for Discharge Goal: Respiratory complications will improve Outcome: Adequate for Discharge Goal: Cardiovascular complication will be avoided Outcome: Adequate for Discharge   Problem: Activity: Goal: Risk for activity intolerance will decrease Outcome: Adequate for Discharge   Problem: Nutrition: Goal: Adequate nutrition will be maintained Outcome: Adequate for Discharge   Problem: Coping: Goal: Level of anxiety will decrease Outcome: Adequate for Discharge   Problem: Elimination: Goal: Will not experience complications related to bowel motility Outcome: Adequate for Discharge Goal: Will not experience complications related to urinary retention Outcome: Adequate for Discharge   Problem: Pain Managment: Goal: General experience of comfort will improve Outcome: Adequate for Discharge   Problem: Safety: Goal: Ability to remain free from injury will improve Outcome: Adequate for Discharge   Problem: Skin Integrity: Goal: Risk for impaired skin integrity will decrease Outcome: Adequate for Discharge   Problem: Education: Goal: Knowledge of disease or condition will improve Outcome: Adequate for Discharge Goal: Understanding of medication regimen will improve Outcome: Adequate for Discharge Goal: Individualized Educational  Video(s) Outcome: Adequate for Discharge   Problem: Activity: Goal: Ability to tolerate increased activity will improve Outcome: Adequate for Discharge   Problem: Cardiac: Goal: Ability to achieve and maintain adequate cardiopulmonary perfusion will improve Outcome: Adequate for Discharge   Problem: Health Behavior/Discharge Planning: Goal: Ability to safely manage health-related needs after discharge will improve Outcome: Adequate for Discharge   Problem: Education: Goal: Ability to demonstrate management of disease process will improve Outcome: Adequate for Discharge Goal: Ability to verbalize understanding of medication therapies will improve Outcome: Adequate for Discharge Goal: Individualized Educational Video(s) Outcome: Adequate for Discharge   Problem: Activity: Goal: Capacity to carry out activities will improve Outcome: Adequate for Discharge   Problem: Cardiac: Goal: Ability to achieve and maintain adequate cardiopulmonary perfusion will improve Outcome: Adequate for Discharge

## 2018-10-01 NOTE — Progress Notes (Signed)
[place] [facility] [place] NAME: Norma Hill    MR#:  [id number]  DATE OF BIRTH:  February 01, 1946  SUBJECTIVE:  CHIEF COMPLAINT:   Chief Complaint  Patient presents with   Shortness of Breath   -Patient feels great today.  Denies any shortness of breath, palpitations.  No nausea, vomiting or diarrhea.  Was finishing up her breakfast.  REVIEW OF SYSTEMS:  Review of Systems  Constitutional: Negative for chills, fever and malaise/fatigue.  HENT: Negative for congestion, ear discharge, hearing loss and nosebleeds.   Eyes: Negative for blurred vision and double vision.  Respiratory: Negative for cough, shortness of breath and wheezing.   Cardiovascular: Negative for chest pain, palpitations and leg swelling.  Gastrointestinal: Negative for abdominal pain, constipation, diarrhea, nausea and vomiting.  Genitourinary: Negative for dysuria.  Musculoskeletal: Negative for myalgias.  Neurological: Negative for dizziness, focal weakness, seizures, weakness and headaches.  Psychiatric/Behavioral: Negative for depression.    DRUG ALLERGIES:  No Known Allergies  VITALS:  Blood pressure (!) 157/85, pulse 89, temperature 97.9 F (36.6 C), temperature source Oral, resp. rate 19, height 5' 3.5" (1.613 m), weight 66.1 kg, SpO2 93 %.  PHYSICAL EXAMINATION:  Physical Exam   GENERAL:  73 y.o.-year-old patient lying in the bed with no acute distress.  EYES: Pupils equal, round, reactive to light and accommodation. No scleral icterus. Extraocular muscles intact.  HEENT: Head atraumatic, normocephalic. Oropharynx and nasopharynx clear.  NECK:  Supple, no jugular venous distention. No thyroid enlargement, no tenderness.  LUNGS: Normal breath sounds bilaterally, no wheezing, rales,rhonchi or crepitation. No use of accessory muscles of respiration.  CARDIOVASCULAR: S1, S2 normal. No murmurs, rubs, or gallops.  ABDOMEN: Soft, nontender, nondistended. Bowel sounds  present. No organomegaly or mass.  EXTREMITIES: No pedal edema, cyanosis, or clubbing.  NEUROLOGIC: Cranial nerves II through XII are intact. Muscle strength 5/5 in all extremities. Sensation intact. Gait not checked.  PSYCHIATRIC: The patient is alert and oriented x 3.  SKIN: No obvious rash, lesion, or ulcer.    LABORATORY PANEL:   CBC Recent Labs  Lab 09/29/18 2016  WBC 8.9  HGB 13.1  HCT 40.2  PLT 294   ------------------------------------------------------------------------------------------------------------------  Chemistries  Recent Labs  Lab 09/29/18 2016 10/01/18 0250  NA 136 140  K 3.4* 2.9*  CL 102 104  CO2 21* 28  GLUCOSE 126* 99  BUN 11 13  CREATININE 0.66 0.73  CALCIUM 9.6 8.9  MG  --  1.7  AST 27  --   ALT 15  --   ALKPHOS 93  --   BILITOT 1.1  --    ------------------------------------------------------------------------------------------------------------------  Cardiac Enzymes No results for input(s): TROPONINI in the last 168 hours. ------------------------------------------------------------------------------------------------------------------  RADIOLOGY:  Dg Chest 1 View  Result Date: 09/29/2018 CLINICAL DATA:  Shortness of breath, atrial fibrillation with rapid ventricular response EXAM: CHEST  1 VIEW COMPARISON:  Portable exam 2010 hours compared to 05/30/2017 FINDINGS: Enlargement of cardiac silhouette with pulmonary vascular congestion. Mediastinal contours normal. Atherosclerotic calcification aorta. RIGHT pleural effusion increased from previous exam with significant RIGHT basilar atelectasis. Underlying abnormalities of the RIGHT lower lobe not excluded. Remaining lungs clear. No pneumothorax or acute osseous findings. IMPRESSION: Enlargement of cardiac silhouette with pulmonary vascular congestion. Increased RIGHT pleural effusion and RIGHT basilar atelectasis since 05/30/2017. Electronically Signed   By: Lavonia Dana M.D.   On: 09/29/2018  20:29    EKG:   Orders placed or performed during the hospital encounter  of 09/29/18   EKG 12-Lead   EKG 12-Lead   ED EKG   ED EKG   EKG 12-Lead   EKG 12-Lead    ASSESSMENT AND PLAN:   73 year old female with past medical history significant for persistent atrial fibrillation failed cardioversion and cryoablation in the past, hypertension, history of syncope presents to hospital secondary to worsening shortness of breath and palpitations.  1.  Atrial fibrillation with RVR-known history of persistent A. fib-failed cardioversion and cryoablation in the past in 2018.  Had reaction to flecainide. -Patient was on Cardizem 360 mg daily at home, has not been taking it lately as she did not know that she was supposed to continue that.  Restarted on oral Cardizem with better heart rate control. -She follows with [facility] cardiology, had a Holter monitor in the past that showed a 3-second pause.  Discussion about pacemaker was initiated during her last office visit, however patient continues to refuse at this time.  She agrees to follow-up with her cardiologist after discharge. Had extensive discussion with her husband over the phone who is really worried about her and did not want her to be discharged before she is better.  However patient today mentions that she is feeling great and wants to go home.  Patient did not give me permission to talk to her husband today as she feels like he worries a lot and she will talk to him.  Ashly, RN was at bedside during this conversation. - TSH is normal -Troponin is normal Appreciate cardiology consult.  Echocardiogram this admission showing low normal systolic function, EF of 50 to 55%, LVH noted.  Mildly elevated right ventricular pressures.  Severely dilated left atrium noted. -Already on Eliquis for anticoagulation.  2.  Acute on chronic CHF exacerbation-received IV Lasix here. -Likely secondary to increased heart rate on admission.  Will discontinue  Lasix at discharge.  Just advised a low-sodium diet and fluid restriction as needed -Has right pleural effusion which has slightly increased since prior, however patient's breathing appears very comfortable, no indication for tap at this time. -Will need outpatient follow-up  3.  Hypokalemia-being replaced.  Recheck later this afternoon  4.  Hypertension-on low-dose lisinopril and also on Cardizem  5.  DVT prophylaxis-on Eliquis  Patient is independent and ambulatory at baseline Patient is very eager to go home today. If potassium improves- discharge later today   All the records are reviewed and case discussed with Care Management/Social Workerr. Management plans discussed with the patient, family and they are in agreement.  CODE STATUS: Full Code  TOTAL TIME TAKING CARE OF THIS PATIENT: 37 minutes.   POSSIBLE D/C TODAY, DEPENDING ON CLINICAL CONDITION.   Gladstone Lighter M.D on 10/01/2018 at 12:17 PM  Between 7am to 6pm - Pager - [phone]  After 6pm go to [email or web address] - password [email or web address] [facility]  Office  [phone]  CC: Primary care physician; Guadalupe Maple, MD

## 2018-10-02 ENCOUNTER — Telehealth: Payer: Self-pay

## 2018-10-02 NOTE — Telephone Encounter (Signed)
I have made the 1st attempt to contact the patient or family member in charge, in order to follow up from recently being discharged from the hospital. 

## 2018-10-03 NOTE — Telephone Encounter (Signed)
I have made the 2nd attempt to contact the patient or family member in charge, in order to follow up from recently being discharged from the hospital.  

## 2018-10-09 ENCOUNTER — Encounter: Payer: Self-pay | Admitting: Family Medicine

## 2018-10-09 ENCOUNTER — Other Ambulatory Visit: Payer: Self-pay

## 2018-10-09 ENCOUNTER — Ambulatory Visit: Payer: Medicare Other | Admitting: Family Medicine

## 2018-10-09 VITALS — BP 130/78 | HR 68 | Temp 98.0°F | Ht 63.0 in | Wt 147.0 lb

## 2018-10-09 DIAGNOSIS — I4891 Unspecified atrial fibrillation: Secondary | ICD-10-CM

## 2018-10-09 NOTE — Progress Notes (Signed)
BP 130/78 (BP Location: Left Arm, Patient Position: Sitting, Cuff Size: Normal)   Pulse 68   Temp 98 F (36.7 C) (Oral)   Ht 5\' 3"  (1.6 m)   Wt 147 lb (66.7 kg)   SpO2 98%   BMI 26.04 kg/m    Subjective:    Patient ID: Norma Hill, female    DOB: November 25, 1945, 73 y.o.   MRN: [id number]  HPI: Norma Hill is a 73 y.o. female  Chief Complaint  Patient presents with  . Hospitalization Follow-up   Patient presenting today for hospital f/u for atrial fib with RVR. Per admission note, patient began having palpitations, SOB, and dizziness and was found to be tachycardic in atrial fibrillation. She noted at that time that she hadn't been taking her diltiazem because she was unaware this was to be a long term script after recent a-fib dx. Medication was restarted during admission and patient has improved significantly. No lingering sxs since d/c home. Also taking the eliquis daily without bleeding or bruising issues. Has f/u with Cardiology next week.   Transition of Care Hospital Follow up.   Hospital/Facility:[facility] D/C Physician: Dr. Tressia Miners D/C Date: 10/01/18  Records Requested: available in Epic Records Received: immediately Records Reviewed: 10/10/18  Diagnoses on Discharge: Atrial fibrillation with RVR  Date of interactive Contact within 48 hours of discharge: 10/02/18 Contact was through: phone  Date of 7 day or 14 day face-to-face visit:    within 14 days  Outpatient Encounter Medications as of 10/09/2018  Medication Sig  . apixaban (ELIQUIS) 5 MG TABS tablet Take 1 tablet (5 mg total) by mouth 2 (two) times daily.  Marland Kitchen diltiazem (CARDIZEM CD) 300 MG 24 hr capsule Take 1 capsule (300 mg total) by mouth daily.  Marland Kitchen acidophilus (RISAQUAD) CAPS capsule Take 1 capsule by mouth daily. (Patient not taking: Reported on 10/09/2018)   No facility-administered encounter medications on file as of 10/09/2018.     Diagnostic Tests Reviewed/Disposition: EKG, labs  Consults:  Cardiology  Discharge Instructions: Continue diltiazem and eliquis, follow up with Cardiology in 2 weeks  Disease/illness Education: given  Home Health/Community Services Discussions/Referrals: n/a  Establishment or re-establishment of referral orders for community resources: n/a  Discussion with other health care providers: n/a  Assessment and Support of treatment regimen adherence: excellent  Appointments Coordinated with: has upcoming Cardiology appt next week  Education for self-management, independent living, and ADLs:  given  Relevant past medical, surgical, family and social history reviewed and updated as indicated. Interim medical history since our last visit reviewed. Allergies and medications reviewed and updated.  Review of Systems  Per HPI unless specifically indicated above     Objective:    BP 130/78 (BP Location: Left Arm, Patient Position: Sitting, Cuff Size: Normal)   Pulse 68   Temp 98 F (36.7 C) (Oral)   Ht 5\' 3"  (1.6 m)   Wt 147 lb (66.7 kg)   SpO2 98%   BMI 26.04 kg/m   Wt Readings from Last 3 Encounters:  10/09/18 147 lb (66.7 kg)  10/01/18 145 lb 11.2 oz (66.1 kg)  05/30/17 168 lb (76.2 kg)    Physical Exam Vitals signs and nursing note reviewed.  Constitutional:      Appearance: Normal appearance. She is not ill-appearing.  HENT:     Head: Atraumatic.  Eyes:     Extraocular Movements: Extraocular movements intact.     Conjunctiva/sclera: Conjunctivae normal.  Neck:     Musculoskeletal: Normal range of  motion and neck supple.  Cardiovascular:     Rate and Rhythm: Normal rate.  Pulmonary:     Effort: Pulmonary effort is normal.     Breath sounds: Normal breath sounds.  Musculoskeletal: Normal range of motion.  Skin:    General: Skin is warm and dry.  Neurological:     Mental Status: She is alert and oriented to person, place, and time.  Psychiatric:        Mood and Affect: Mood normal.        Thought Content: Thought content  normal.        Judgment: Judgment normal.     Results for orders placed or performed during the hospital encounter of 09/29/18  SARS Coronavirus 2 (CEPHEID - Performed in [place] hospital lab), [facility] Order   Specimen: Nasopharyngeal Swab  Result Value Ref Range   SARS Coronavirus 2 NEGATIVE NEGATIVE  CBC  Result Value Ref Range   WBC 8.9 4.0 - 10.5 K/uL   RBC 4.71 3.87 - 5.11 MIL/uL   Hemoglobin 13.1 12.0 - 15.0 g/dL   HCT 40.2 36.0 - 46.0 %   MCV 85.4 80.0 - 100.0 fL   MCH 27.8 26.0 - 34.0 pg   MCHC 32.6 30.0 - 36.0 g/dL   RDW 15.8 (H) 11.5 - 15.5 %   Platelets 294 150 - 400 K/uL   nRBC 0.0 0.0 - 0.2 %  Comprehensive metabolic panel  Result Value Ref Range   Sodium 136 135 - 145 mmol/L   Potassium 3.4 (L) 3.5 - 5.1 mmol/L   Chloride 102 98 - 111 mmol/L   CO2 21 (L) 22 - 32 mmol/L   Glucose, Bld 126 (H) 70 - 99 mg/dL   BUN 11 8 - 23 mg/dL   Creatinine, Ser 0.66 0.44 - 1.00 mg/dL   Calcium 9.6 8.9 - 10.3 mg/dL   Total Protein 7.3 6.5 - 8.1 g/dL   Albumin 4.7 3.5 - 5.0 g/dL   AST 27 15 - 41 U/L   ALT 15 0 - 44 U/L   Alkaline Phosphatase 93 38 - 126 U/L   Total Bilirubin 1.1 0.3 - 1.2 mg/dL   GFR calc non Af Amer >60 >60 mL/min   GFR calc Af Amer >60 >60 mL/min   Anion gap 13 5 - 15  Brain natriuretic peptide  Result Value Ref Range   B Natriuretic Peptide 988.0 (H) 0.0 - 100.0 pg/mL  TSH  Result Value Ref Range   TSH 2.786 0.350 - 4.500 uIU/mL  Hemoglobin A1c  Result Value Ref Range   Hgb A1c MFr Bld 5.4 4.8 - 5.6 %   Mean Plasma Glucose 108.28 mg/dL  Basic metabolic panel  Result Value Ref Range   Sodium 140 135 - 145 mmol/L   Potassium 2.9 (L) 3.5 - 5.1 mmol/L   Chloride 104 98 - 111 mmol/L   CO2 28 22 - 32 mmol/L   Glucose, Bld 99 70 - 99 mg/dL   BUN 13 8 - 23 mg/dL   Creatinine, Ser 0.73 0.44 - 1.00 mg/dL   Calcium 8.9 8.9 - 10.3 mg/dL   GFR calc non Af Amer >60 >60 mL/min   GFR calc Af Amer >60 >60 mL/min   Anion gap 8 5 - 15  Magnesium  Result  Value Ref Range   Magnesium 1.7 1.7 - 2.4 mg/dL  Potassium  Result Value Ref Range   Potassium 3.4 (L) 3.5 - 5.1 mmol/L  ECHOCARDIOGRAM COMPLETE  Result Value  Ref Range   Weight 2,417.6 oz   Height 63.5 in   BP 145/85 mmHg  Troponin I (High Sensitivity)  Result Value Ref Range   Troponin I (High Sensitivity) 17 <18 ng/L      Assessment & Plan:   Problem List Items Addressed This Visit      Cardiovascular and Mediastinum   Atrial fibrillation with RVR ([place]) - Primary    Stable since d/c on current regimen, without sxs or abnormal HR. F/u as scheduled with Cardiology next week and continue current regimen          Follow up plan: Return for CPE.

## 2018-10-10 NOTE — Assessment & Plan Note (Signed)
Stable since d/c on current regimen, without sxs or abnormal HR. F/u as scheduled with Cardiology next week and continue current regimen

## 2018-12-03 ENCOUNTER — Ambulatory Visit: Payer: Self-pay | Admitting: *Deleted

## 2018-12-03 NOTE — Telephone Encounter (Signed)
Pt's son calling to report that patient has leg swelling and to have the patient scheduled for an appt.    Attempted to contact pt on home number but no answer at this time. Left message for pt to return call.   Attempted to call mobile number but no answer at this time and unable to leave a message due to voicemail box not being set up.

## 2018-12-03 NOTE — Telephone Encounter (Signed)
Attempted to call patient several times. No answer. Was able to leave message on one number. Will route to the [facility] for Review.

## 2018-12-04 ENCOUNTER — Other Ambulatory Visit: Payer: Self-pay

## 2018-12-04 ENCOUNTER — Inpatient Hospital Stay: Payer: Medicare Other

## 2018-12-04 ENCOUNTER — Encounter: Payer: Self-pay | Admitting: Emergency Medicine

## 2018-12-04 ENCOUNTER — Emergency Department: Payer: Medicare Other

## 2018-12-04 ENCOUNTER — Inpatient Hospital Stay
Admission: EM | Admit: 2018-12-04 | Discharge: 2018-12-09 | DRG: 065 | Disposition: A | Discharge status: Home / Self Care | Payer: Medicare Other | Attending: Internal Medicine | Admitting: Internal Medicine

## 2018-12-04 ENCOUNTER — Inpatient Hospital Stay
Admit: 2018-12-04 | Discharge: 2018-12-04 | Disposition: A | Discharge status: Home / Self Care | Payer: Medicare Other | Attending: Internal Medicine | Admitting: Internal Medicine

## 2018-12-04 DIAGNOSIS — R32 Unspecified urinary incontinence: Secondary | ICD-10-CM | POA: Diagnosis not present

## 2018-12-04 DIAGNOSIS — Z79899 Other long term (current) drug therapy: Secondary | ICD-10-CM

## 2018-12-04 DIAGNOSIS — Z9221 Personal history of antineoplastic chemotherapy: Secondary | ICD-10-CM

## 2018-12-04 DIAGNOSIS — R410 Disorientation, unspecified: Secondary | ICD-10-CM | POA: Diagnosis not present

## 2018-12-04 DIAGNOSIS — N390 Urinary tract infection, site not specified: Secondary | ICD-10-CM | POA: Diagnosis not present

## 2018-12-04 DIAGNOSIS — Z923 Personal history of irradiation: Secondary | ICD-10-CM

## 2018-12-04 DIAGNOSIS — E876 Hypokalemia: Secondary | ICD-10-CM | POA: Diagnosis present

## 2018-12-04 DIAGNOSIS — Z853 Personal history of malignant neoplasm of breast: Secondary | ICD-10-CM | POA: Diagnosis not present

## 2018-12-04 DIAGNOSIS — I63511 Cerebral infarction due to unspecified occlusion or stenosis of right middle cerebral artery: Secondary | ICD-10-CM | POA: Diagnosis not present

## 2018-12-04 DIAGNOSIS — I639 Cerebral infarction, unspecified: Secondary | ICD-10-CM | POA: Diagnosis present

## 2018-12-04 DIAGNOSIS — I110 Hypertensive heart disease with heart failure: Secondary | ICD-10-CM | POA: Diagnosis present

## 2018-12-04 DIAGNOSIS — R29700 NIHSS score 0: Secondary | ICD-10-CM | POA: Diagnosis present

## 2018-12-04 DIAGNOSIS — Z803 Family history of malignant neoplasm of breast: Secondary | ICD-10-CM | POA: Diagnosis not present

## 2018-12-04 DIAGNOSIS — Z7901 Long term (current) use of anticoagulants: Secondary | ICD-10-CM | POA: Diagnosis not present

## 2018-12-04 DIAGNOSIS — I5032 Chronic diastolic (congestive) heart failure: Secondary | ICD-10-CM | POA: Diagnosis present

## 2018-12-04 DIAGNOSIS — Z20828 Contact with and (suspected) exposure to other viral communicable diseases: Secondary | ICD-10-CM | POA: Diagnosis present

## 2018-12-04 DIAGNOSIS — Z9049 Acquired absence of other specified parts of digestive tract: Secondary | ICD-10-CM

## 2018-12-04 DIAGNOSIS — I480 Paroxysmal atrial fibrillation: Secondary | ICD-10-CM | POA: Diagnosis present

## 2018-12-04 LAB — POTASSIUM
Potassium: 3.1 mmol/L — ABNORMAL LOW (ref 3.5–5.1)
Potassium: 2.7 mmol/L — CL (ref 3.5–5.1)

## 2018-12-04 LAB — COMPREHENSIVE METABOLIC PANEL
ALT: 13 U/L (ref 0–44)
AST: 24 U/L (ref 15–41)
Albumin: 4.4 g/dL (ref 3.5–5.0)
Alkaline Phosphatase: 79 U/L (ref 38–126)
Anion gap: 12 (ref 5–15)
BUN: 9 mg/dL (ref 8–23)
CO2: 27 mmol/L (ref 22–32)
Calcium: 9.6 mg/dL (ref 8.9–10.3)
Chloride: 102 mmol/L (ref 98–111)
Creatinine, Ser: 0.83 mg/dL (ref 0.44–1.00)
GFR calc Af Amer: >60 mL/min (ref >60)
GFR calc non Af Amer: >60 mL/min (ref >60)
Glucose, Bld: 117 mg/dL — ABNORMAL HIGH (ref 70–99)
Potassium: 2.5 mmol/L — CL (ref 3.5–5.1)
Sodium: 141 mmol/L (ref 135–145)
Total Bilirubin: 1.2 mg/dL (ref 0.3–1.2)
Total Protein: 7 g/dL (ref 6.5–8.1)

## 2018-12-04 LAB — CBC
HCT: 39.7 % (ref 36.0–46.0)
Hemoglobin: 12.8 g/dL (ref 12.0–15.0)
MCH: 27.9 pg (ref 26.0–34.0)
MCHC: 32.2 g/dL (ref 30.0–36.0)
MCV: 86.5 fL (ref 80.0–100.0)
Platelets: 247 10*3/uL (ref 150–400)
RBC: 4.59 MIL/uL (ref 3.87–5.11)
RDW: 15.2 % (ref 11.5–15.5)
WBC: 7.2 10*3/uL (ref 4.0–10.5)
nRBC: 0 % (ref 0.0–0.2)

## 2018-12-04 LAB — SARS CORONAVIRUS 2 (TAT 6-24 HRS): SARS Coronavirus 2: NEGATIVE

## 2018-12-04 LAB — URINALYSIS, COMPLETE (UACMP) WITH MICROSCOPIC
Bilirubin Urine: NEGATIVE
Glucose, UA: NEGATIVE mg/dL
Hgb urine dipstick: NEGATIVE
Ketones, ur: NEGATIVE mg/dL
Leukocytes,Ua: NEGATIVE
Nitrite: NEGATIVE
Protein, ur: NEGATIVE mg/dL
Specific Gravity, Urine: 1.005 (ref 1.005–1.030)
Squamous Epithelial / HPF: NONE SEEN (ref 0–5)
pH: 7 (ref 5.0–8.0)

## 2018-12-04 LAB — PROTIME-INR
INR: 1.1 (ref 0.8–1.2)
Prothrombin Time: 14.2 seconds (ref 11.4–15.2)

## 2018-12-04 LAB — TROPONIN I (HIGH SENSITIVITY)
Troponin I (High Sensitivity): 16 ng/L (ref <18)
Troponin I (High Sensitivity): 16 ng/L (ref <18)

## 2018-12-04 LAB — MAGNESIUM: Magnesium: 2 mg/dL (ref 1.7–2.4)

## 2018-12-04 MED ORDER — ACETAMINOPHEN 650 MG RE SUPP: 650.0000 mg | RECTAL | Status: DC | PRN

## 2018-12-04 MED ORDER — ROSUVASTATIN CALCIUM 10 MG PO TABS
10.0000 mg | ORAL_TABLET | Freq: Every day | ORAL | Status: DC
  Administered 2018-12-04 – 2018-12-09 (×6): 10 mg via ORAL
  Filled 2018-12-04 (×6): qty 1

## 2018-12-04 MED ORDER — DILTIAZEM HCL ER COATED BEADS 300 MG PO CP24
300.0000 mg | ORAL_CAPSULE | Freq: Every day | ORAL | Status: DC
  Administered 2018-12-04 – 2018-12-09 (×6): 300 mg via ORAL
  Filled 2018-12-04 (×6): qty 1

## 2018-12-04 MED ORDER — POTASSIUM CHLORIDE 10 MEQ/100ML IV SOLN
10.0000 meq | INTRAVENOUS | Status: DC
  Filled 2018-12-04 (×4): qty 100

## 2018-12-04 MED ORDER — ACETAMINOPHEN 325 MG PO TABS: 650.0000 mg | ORAL_TABLET | ORAL | Status: DC | PRN

## 2018-12-04 MED ORDER — DILTIAZEM HCL 25 MG/5ML IV SOLN
10.0000 mg | Freq: Once | INTRAVENOUS | Status: AC
  Administered 2018-12-04: 20:00:00 10 mg via INTRAVENOUS
  Filled 2018-12-04: qty 5

## 2018-12-04 MED ORDER — APIXABAN 5 MG PO TABS
5.0000 mg | ORAL_TABLET | Freq: Two times a day (BID) | ORAL | Status: DC
  Administered 2018-12-04 – 2018-12-09 (×10): 5 mg via ORAL
  Filled 2018-12-04 (×10): qty 1

## 2018-12-04 MED ORDER — ACETAMINOPHEN 160 MG/5ML PO SOLN
650.0000 mg | ORAL | Status: DC | PRN
  Filled 2018-12-04: qty 20.3

## 2018-12-04 MED ORDER — SODIUM CHLORIDE 0.9 % IV BOLUS
1000.0000 mL | Freq: Once | INTRAVENOUS | Status: AC
  Administered 2018-12-04: 1000 mL via INTRAVENOUS

## 2018-12-04 MED ORDER — STROKE: EARLY STAGES OF RECOVERY BOOK
Freq: Once | Status: AC
  Administered 2018-12-04: 15:00:00

## 2018-12-04 MED ORDER — POTASSIUM CHLORIDE 10 MEQ/100ML IV SOLN: 10.0000 meq | INTRAVENOUS | Status: DC

## 2018-12-04 MED ORDER — POTASSIUM CHLORIDE CRYS ER 20 MEQ PO TBCR
40.0000 meq | EXTENDED_RELEASE_TABLET | Freq: Once | ORAL | Status: AC
  Administered 2018-12-04: 40 meq via ORAL
  Filled 2018-12-04: qty 2

## 2018-12-04 MED ORDER — HYDRALAZINE HCL 20 MG/ML IJ SOLN
10.0000 mg | Freq: Four times a day (QID) | INTRAMUSCULAR | Status: DC | PRN
  Administered 2018-12-04: 10 mg via INTRAVENOUS
  Filled 2018-12-04: qty 1

## 2018-12-04 MED ORDER — POTASSIUM CHLORIDE CRYS ER 20 MEQ PO TBCR: 40.0000 meq | EXTENDED_RELEASE_TABLET | Freq: Once | ORAL | Status: DC

## 2018-12-04 MED ORDER — POTASSIUM CHLORIDE 10 MEQ/100ML IV SOLN
10.0000 meq | INTRAVENOUS | Status: AC
  Administered 2018-12-05 (×4): 10 meq via INTRAVENOUS
  Filled 2018-12-04: qty 100

## 2018-12-04 MED ORDER — DILTIAZEM HCL 25 MG/5ML IV SOLN
10.0000 mg | Freq: Once | INTRAVENOUS | Status: AC
  Administered 2018-12-04: 10 mg via INTRAVENOUS
  Filled 2018-12-04: qty 5

## 2018-12-04 MED ORDER — METOPROLOL TARTRATE 5 MG/5ML IV SOLN: 5.0000 mg | INTRAVENOUS | Status: DC | PRN

## 2018-12-04 NOTE — ED Notes (Addendum)
Pt noted to be in a-fib after admin of dilt, HR 90-110 after admin of medication. Will continue to monitor for further patient needs.

## 2018-12-04 NOTE — ED Provider Notes (Signed)
[facility] Emergency Department Provider Note  Time seen: 12:13 PM  I have reviewed the triage vital signs and the nursing notes.   HISTORY  Chief Complaint Altered Mental Status and Tachycardia   HPI Norma Hill is a 73 y.o. female with a past medical history of atrial fibrillation, hypertension, presents to the emergency department for altered mental status/confusion.  According to the husband 3 weeks ago patient was driving, and performing all of her daily activities herself.  States over the past 2 weeks the patient has become more more confused to the point where she can no longer dress herself.  Husband states she has become agitated at times which is very atypical.  Patient currently is awake alert she is oriented to person place and time.  She is able to answer questions and follow commands although does need her husband's help for some questions.  Denies any complaints at this time.  Largely negative review of systems per patient and husband.   Past Medical History:  Diagnosis Date  . A-fib ([place])   . Bowel trouble 2012   ocasionally  . Cancer ([place]) 10/2000   pt had 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 Pt then had a repeat excision on 11/16/2000  . Hypertension 2006  . Personal history of malignant neoplasm of breast 2002  . Solitary cyst of breast 2012  . Special screening for malignant neoplasms, colon     Patient Active Problem List   Diagnosis Date Noted  . Atrial fibrillation with RVR ([place]) 09/29/2018  . Pleural effusion 05/13/2017  . SOB (shortness of breath) 05/12/2017  . New onset atrial fibrillation ([place]) 02/09/2016  . Personal history of malignant neoplasm of breast   . Cancer ([place]) 10/04/2000    Past Surgical History:  Procedure Laterality Date  . BREAST BIOPSY Right 2002  . BREAST SURGERY Right 2002   wide excision and sn x4 done 10/29/00 with repeat excision done on 11/16/2000  with post operative radiation treatment and 5 years of tamoxifen with completion in 2007  . CHOLECYSTECTOMY  2002  . COLONOSCOPY  2008  . DILATION AND CURETTAGE OF UTERUS  1990,s  . ELECTROPHYSIOLOGIC STUDY N/A 03/16/2016   Procedure: CARDIOVERSION;  Surgeon: Isaias Cowman, MD;  Location: [facility] ORS;  Service: Cardiovascular;  Laterality: N/A;    Prior to Admission medications   Medication Sig Start Date End Date Taking? Authorizing Provider  acidophilus (RISAQUAD) CAPS capsule Take 1 capsule by mouth daily. Patient not taking: Reported on 10/09/2018 10/02/18   Gladstone Lighter, MD  apixaban (ELIQUIS) 5 MG TABS tablet Take 1 tablet (5 mg total) by mouth 2 (two) times daily. 10/01/18   Gladstone Lighter, MD  diltiazem (CARDIZEM CD) 300 MG 24 hr capsule Take 1 capsule (300 mg total) by mouth daily. 10/01/18   Gladstone Lighter, MD    No Known Allergies  Family History  Problem Relation Age of Onset  . Cancer Other        breast, relationship not listed    Social History Social History   Tobacco Use  . Smoking status: Never Smoker  . Smokeless tobacco: Never Used  Substance Use Topics  . Alcohol use: No  . Drug use: No    Review of Systems Constitutional: Negative for fever.  Positive for increased confusion. Cardiovascular: Negative for chest pain. Respiratory: Negative for shortness of breath. Gastrointestinal: Negative for abdominal pain Genitourinary: Negative for urinary compaints Musculoskeletal: Negative for musculoskeletal complaints  Neurological: Negative for headache All other ROS negative  ____________________________________________   PHYSICAL EXAM:  VITAL SIGNS: ED Triage Vitals  Enc Vitals Group     BP 12/04/18 1116 (!) 162/97     Pulse Rate 12/04/18 1116 (!) 156     Resp 12/04/18 1116 19     Temp 12/04/18 1116 98.9 F (37.2 C)     Temp Source 12/04/18 1116 Oral     SpO2 12/04/18 1116 96 %     Weight 12/04/18 1115 147 lb (66.7 kg)     Height  12/04/18 1115 5\' 5"  (1.651 m)     Head Circumference --      Peak Flow --      Pain Score 12/04/18 1126 0     Pain Loc --      Pain Edu? --      Excl. in [place]? --    Constitutional: Alert and oriented. Well appearing and in no distress. Eyes: Normal exam ENT      Head: Normocephalic and atraumatic.      Mouth/Throat: Mucous membranes are moist. Cardiovascular: Normal rate, regular rhythm. Respiratory: Normal respiratory effort without tachypnea nor retractions. Breath sounds are clear  Gastrointestinal: Soft and nontender. No distention.  Musculoskeletal: Nontender with normal range of motion in all extremities.  Neurologic:  Normal speech and language. No gross focal neurologic deficits Skin:  Skin is warm, dry and intact.  Psychiatric: Mood and affect are normal.   ____________________________________________    EKG  EKG viewed and interpreted by myself shows atrial fibrillation with rapid ventricular response at 135 bpm with a narrow QRS, normal axis, normal intervals, nonspecific ST changes without ST elevation.  ____________________________________________    RADIOLOGY  CT consistent with acute to subacute infarct.  ____________________________________________   INITIAL IMPRESSION / ASSESSMENT AND PLAN / ED COURSE  Pertinent labs & imaging results that were available during my care of the patient were reviewed by me and considered in my medical decision making (see chart for details).   Patient presents to the emergency department for increased confusion over the past 2 weeks.  Differential would include CVA given her history of atrial fibrillation, UTI, electrolyte or metabolic abnormality, infectious etiology.  We will check labs, urinalysis, obtain CT scan of the head.  Patient is currently in atrial fibrillation with rapid ventricular response we will dose IV diltiazem, IV fluids and continue to closely monitor.  Radiologist has called me regarding the CT scan which  shows an acute or subacute infarct which fits the clinical picture.  Patient's blood work is largely within normal limits, urinalysis pending.  Patient will be admitted to the hospital service for further treatment.  NIH Stroke Scale   Interval: Baseline Time: 12:47 PM Person Administering Scale: Harvest Dark  Administer stroke scale items in the order listed. Record performance in each category after each subscale exam. Do not go back and change scores. Follow directions provided for each exam technique. Scores should reflect what the patient does, not what the clinician thinks the patient can do. The clinician should record answers while administering the exam and work quickly. Except where indicated, the patient should not be coached (i.e., repeated requests to patient to make a special effort).   1a  Level of consciousness: 0=alert; keenly responsive  1b. LOC questions:  0=Performs both tasks correctly  1c. LOC commands: 0=Performs both tasks correctly  2.  Best Gaze: 0=normal  3.  Visual: 0=No visual loss  4. Facial Palsy: 0=Normal symmetric  movement  5a.  Motor left arm: 0=No drift, limb holds 90 (or 45) degrees for full 10 seconds  5b.  Motor right arm: 0=No drift, limb holds 90 (or 45) degrees for full 10 seconds  6a. motor left leg: 0=No drift, limb holds 90 (or 45) degrees for full 10 seconds  6b  Motor right leg:  0=No drift, limb holds 90 (or 45) degrees for full 10 seconds  7. Limb Ataxia: 0=Absent  8.  Sensory: 0=Normal; no sensory loss  9. Best Language:  0=No aphasia, normal  10. Dysarthria: 0=Normal  11. Extinction and Inattention: 0=No abnormality  12. Distal motor function: 0=Normal   Total:   0    Venisha Higgs Hunting was evaluated in Emergency Department on 12/04/2018 for the symptoms described in the history of present illness. She was evaluated in the context of the global COVID-19 pandemic, which necessitated consideration that the patient might be at risk for  infection with the SARS-CoV-2 virus that causes COVID-19. Institutional protocols and algorithms that pertain to the evaluation of patients at risk for COVID-19 are in a state of rapid change based on information released by regulatory bodies including the CDC and federal and state organizations. These policies and algorithms were followed during the patient's care in the ED.  CRITICAL CARE Performed by: Harvest Dark   Total critical care time: 30 minutes  Critical care time was exclusive of separately billable procedures and treating other patients.  Critical care was necessary to treat or prevent imminent or life-threatening deterioration.  Critical care was time spent personally by me on the following activities: development of treatment plan with patient and/or surrogate as well as nursing, discussions with consultants, evaluation of patient's response to treatment, examination of patient, obtaining history from patient or surrogate, ordering and performing treatments and interventions, ordering and review of laboratory studies, ordering and review of radiographic studies, pulse oximetry and re-evaluation of patient's condition.   ____________________________________________   FINAL CLINICAL IMPRESSION(S) / ED DIAGNOSES  Confusion Atrial fibrillation with rapid ventricular response CVA   Harvest Dark, MD 12/04/18 1251

## 2018-12-04 NOTE — Consult Note (Addendum)
[facility] for Electrolyte Monitoring and Replacement   Recent Labs: Potassium (mmol/L)  Date Value  12/04/2018 2.5 (LL)   Magnesium (mg/dL)  Date Value  10/01/2018 1.7   Calcium (mg/dL)  Date Value  12/04/2018 9.6   Albumin (g/dL)  Date Value  12/04/2018 4.4   Phosphorus (mg/dL)  Date Value  02/10/2016 3.1   Sodium (mmol/L)  Date Value  12/04/2018 141  04/18/2016 144     Assessment: Pharmacy was consulted for magnesium and potassium replacement. No magnesium ordered at this time. Scr is WNL.   K 2.5 - no replacements ordered at this time.   Goal of Therapy:  Electrolytes WNL  Plan:  1.Will order KCL 10 mEq x4 runs and KCL PO 40 mEq x1 dose. Will recheck potassium @ 2000.  Addendum @ [id number]: 3.1 (@ [id number] ). Level was supposed to be rechecked at 2000, but was unintentionally checked early. Lab was called to credited level to account. KCL IV & PO were not given in the ER, per  floor nurse on 1C. Called ED nurse to verify if KCL was given in the ED and she said that it was not. Will cancel KCL IV and adjust KCL PO timing and recheck level.    2. Will order magnesium. Addendum @ [id number]: Mg 2.0. No replacements indicated at this time. Will follow Mg with AM labs   Rowland Lathe ,PharmD Clinical Pharmacist 12/04/2018 2:01 PM

## 2018-12-04 NOTE — ED Notes (Signed)
This RN and [employer] representative introduced self to pt. Pt A&O x1, alert to self. Pt stated the year was 2010 and the president was [employer]. Pt's husband states that he has noticed worsening confusion over the past week. Husband states pt has forgot how to work the microwave, does not where she is and has been putting on her bras backwards. Will continue to monitor.

## 2018-12-04 NOTE — ED Notes (Signed)
Neurologist at bedside at this time.

## 2018-12-04 NOTE — H&P (Signed)
[facility] [facility] [place] NAME: Norma Hill    MR#:  [id number]  DATE OF BIRTH:  21-Apr-1945  DATE OF ADMISSION:  12/04/2018  PRIMARY CARE PHYSICIAN: Guadalupe Maple, MD   REQUESTING/REFERRING PHYSICIAN: Dr Wilnette Kales  CHIEF COMPLAINT:  AMS/confusion   HISTORY OF PRESENT ILLNESS:  Norma Hill  is a 73 y.o. female with a known history of PAF, chronic diastolic heart failure and hypertension who presents emergency room due to 1 to 2 weeks of confusion and unable to properly dress herself.  CT scan positive for acute/subacute CVA Fibrillation with RVR.  She was given diltiazem heart rate better controlled. PAST MEDICAL HISTORY:   Past Medical History:  Diagnosis Date  . A-fib ([place])   . Bowel trouble 2012   ocasionally  . Cancer ([place]) 10/2000   pt had 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 Pt then had a repeat excision on 11/16/2000  . Hypertension 2006  . Personal history of malignant neoplasm of breast 2002  . Solitary cyst of breast 2012  . Special screening for malignant neoplasms, colon     PAST SURGICAL HISTORY:   Past Surgical History:  Procedure Laterality Date  . BREAST BIOPSY Right 2002  . BREAST SURGERY Right 2002   wide excision and sn x4 done 10/29/00 with repeat excision done on 11/16/2000 with post operative radiation treatment and 5 years of tamoxifen with completion in 2007  . CHOLECYSTECTOMY  2002  . COLONOSCOPY  2008  . DILATION AND CURETTAGE OF UTERUS  1990,s  . ELECTROPHYSIOLOGIC STUDY N/A 03/16/2016   Procedure: CARDIOVERSION;  Surgeon: Isaias Cowman, MD;  Location: [facility] ORS;  Service: Cardiovascular;  Laterality: N/A;    SOCIAL HISTORY:   Social History   Tobacco Use  . Smoking status: Never Smoker  . Smokeless tobacco: Never Used  Substance Use Topics  . Alcohol use: No    FAMILY HISTORY:   Family History  Problem Relation Age of Onset  . Cancer  Other        breast, relationship not listed    DRUG ALLERGIES:  No Known Allergies  REVIEW OF SYSTEMS:   Review of Systems  Constitutional: Negative.  Negative for chills, fever and malaise/fatigue.  HENT: Negative.  Negative for ear discharge, ear pain, hearing loss, nosebleeds and sore throat.   Eyes: Negative.  Negative for blurred vision and pain.  Respiratory: Negative.  Negative for cough, hemoptysis, shortness of breath and wheezing.   Cardiovascular: Negative.  Negative for chest pain, palpitations and leg swelling.  Gastrointestinal: Negative.  Negative for abdominal pain, blood in stool, diarrhea, nausea and vomiting.  Genitourinary: Negative.  Negative for dysuria.  Musculoskeletal: Negative.  Negative for back pain.  Skin: Negative.   Neurological: Positive for focal weakness and headaches. Negative for dizziness, tremors, speech change and seizures.  Endo/Heme/Allergies: Negative.  Does not bruise/bleed easily.  Psychiatric/Behavioral: Positive for memory loss. Negative for depression, hallucinations and suicidal ideas.    MEDICATIONS AT HOME:   Prior to Admission medications   Medication Sig Start Date End Date Taking? Authorizing Provider  acidophilus (RISAQUAD) CAPS capsule Take 1 capsule by mouth daily. Patient not taking: Reported on 10/09/2018 10/02/18   Gladstone Lighter, MD  apixaban (ELIQUIS) 5 MG TABS tablet Take 1 tablet (5 mg total) by mouth 2 (two) times daily. 10/01/18   Gladstone Lighter, MD  diltiazem (CARDIZEM CD) 300 MG 24 hr capsule Take 1 capsule (300 mg  total) by mouth daily. 10/01/18   Gladstone Lighter, MD      VITAL SIGNS:  Blood pressure (!) 162/97, pulse (!) 156, temperature 98.9 F (37.2 C), temperature source Oral, resp. rate 19, height 5\' 5"  (1.651 m), weight 66.7 kg, SpO2 96 %.  PHYSICAL EXAMINATION:   Physical Exam Constitutional:      General: She is not in acute distress. HENT:     Head: Normocephalic.  Eyes:     General: No  scleral icterus. Neck:     Musculoskeletal: Normal range of motion and neck supple.     Vascular: No JVD.     Trachea: No tracheal deviation.  Cardiovascular:     Rate and Rhythm: Normal rate and regular rhythm.     Heart sounds: Normal heart sounds. No murmur. No friction rub. No gallop.   Pulmonary:     Effort: Pulmonary effort is normal. No respiratory distress.     Breath sounds: Normal breath sounds. No wheezing or rales.  Chest:     Chest wall: No tenderness.  Abdominal:     General: Bowel sounds are normal. There is no distension.     Palpations: Abdomen is soft. There is no mass.     Tenderness: There is no abdominal tenderness. There is no guarding or rebound.  Musculoskeletal: Normal range of motion.  Skin:    General: Skin is warm.     Findings: No erythema or rash.  Neurological:     Mental Status: She is alert and oriented to person, place, and time.  Psychiatric:        Judgment: Judgment normal.       LABORATORY PANEL:   CBC Recent Labs  Lab 12/04/18 1128  WBC 7.2  HGB 12.8  HCT 39.7  PLT 247   ------------------------------------------------------------------------------------------------------------------  Chemistries  Recent Labs  Lab 12/04/18 1128  NA 141  K 2.5*  CL 102  CO2 27  GLUCOSE 117*  BUN 9  CREATININE 0.83  CALCIUM 9.6  AST 24  ALT 13  ALKPHOS 79  BILITOT 1.2   ------------------------------------------------------------------------------------------------------------------  Cardiac Enzymes No results for input(s): TROPONINI in the last 168 hours. ------------------------------------------------------------------------------------------------------------------  RADIOLOGY:  Ct Head Wo Contrast  Result Date: 12/04/2018 CLINICAL DATA:  Confusion EXAM: CT HEAD WITHOUT CONTRAST TECHNIQUE: Contiguous axial images were obtained from the base of the skull through the vertex without intravenous contrast. COMPARISON:  None.  FINDINGS: Brain: Low-density changes within the right parieto-occipital region suggesting a acute to subacute infarct. No intracranial hemorrhage, mass, hydrocephalus, or midline shift. Small remote lacunar infarct in the left basal ganglia. Scattered low-density changes within the periventricular and subcortical white matter compatible with chronic microvascular ischemic change. Mild diffuse cerebral volume loss. Vascular: Mild atherosclerotic calcifications involving the large vessels of the skull base. No unexpected hyperdense vessel. Skull: Normal. Negative for fracture or focal lesion. Sinuses/Orbits: No acute finding. Other: None. IMPRESSION: 1. 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 Follow-up studies with MRI, as clinically indicated. 2. No acute intracranial hemorrhage. 3. Microvascular ischemic changes and cerebral volume loss. These results were called by telephone at the time of interpretation on 12/04/2018 at 12:42 pm to provider [facility] , who verbally acknowledged these results. Electronically Signed   By: Davina Poke M.D.   On: 12/04/2018 12:45    EKG:  NSR no ST elevation depression   IMPRESSION AND PLAN:   73 y/o female with PAF and chronic diastolic heart failure  pEF by ECHO 09/2018 who presents to the emergency room due to 2 weeks of increased confusion.  1. 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 PT/OT/Speech consult Check A1c and lipid panel Continue Eliquis and add statin.  2. PAF: OK to continue Diltiazem since symptoms have been going on for longer then a few days. Continue Eliquis Tele monitor  3. Hypokalemia: Replete and recheck in am Also will check Mg.   4. Chronic Diastolic heart failure:  No  signs of exacerbation    All the records are reviewed and case discussed with ED provider. Management plans discussed with the patient and she is in agreement  CODE STATUS: FULL  TOTAL TIME TAKING CARE OF THIS PATIENT: 45 minutes.    Bettey Costa M.D on 12/04/2018 at 12:54 PM  Between 7am to 6pm - Pager - [phone]  After 6pm go to [email or web address] - password [email or web address] [facility]  Office  [phone]  CC: Primary care physician; Guadalupe Maple, MD

## 2018-12-04 NOTE — Consult Note (Signed)
Reason for Consult:confusion  Referring Physician: Dr. Benjie Karvonen  CC: confusion/disorientation   HPI: Norma Hill is an 73 y.o. female  with a past medical history of atrial fibrillation on elequis, hypertension, presents to the emergency department for altered mental status/confusion.  As per husband over the past 2 weeks the patient has become more more confused and needing daily assistance. He is unsure if she is taking her medications appropriately.  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    Past Medical History:  Diagnosis Date  . A-fib ([place])   . Bowel trouble 2012   ocasionally  . 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 Pt then had a repeat excision on 11/16/2000  . Hypertension 2006  . Personal history of malignant neoplasm of breast 2002  . Solitary cyst of breast 2012  . Special screening for malignant neoplasms, colon     Past Surgical History:  Procedure Laterality Date  . BREAST BIOPSY Right 2002  . BREAST SURGERY Right 2002   wide excision and sn x4 done 10/29/00 with repeat excision done on 11/16/2000 with post operative radiation treatment and 5 years of tamoxifen with completion in 2007  . CHOLECYSTECTOMY  2002  . COLONOSCOPY  2008  . DILATION AND CURETTAGE OF UTERUS  1990,s  . ELECTROPHYSIOLOGIC STUDY N/A 03/16/2016   Procedure: CARDIOVERSION;  Surgeon: Isaias Cowman, MD;  Location: [facility] ORS;  Service: Cardiovascular;  Laterality: N/A;    Family History  Problem Relation Age of Onset  . Cancer Other        breast, relationship not listed    Social History:  reports that she has never smoked. She has never used smokeless tobacco. She reports that she does not drink alcohol or use drugs.  No Known Allergies  Medications: I have reviewed the patient's current medications.  ROS: Unable to obtain due to confusion   Physical Examination: Blood pressure (!)  168/110, pulse (!) 107, temperature 98.9 F (37.2 C), temperature source Oral, resp. rate 20, height 5\' 5"  (1.651 m), weight 66.7 kg, SpO2 91 %.    Neurological Examination   Mental Status: Alert to name and location. Confusion/disorientation Cranial Nerves: II: Discs flat bilaterally; Visual fields grossly normal, pupils equal, round, reactive to light and accommodation III,IV, VI: ptosis not present, extra-ocular motions intact bilaterally V,VII: smile symmetric, facial light touch sensation normal bilaterally VIII: hearing normal bilaterally IX,X: gag reflex present XI: bilateral shoulder shrug XII: midline tongue extension Motor: Right : Upper extremity   5/5    Left:     Upper extremity   5/5  Lower extremity   5/5     Lower extremity   5/5 Tone and bulk:normal tone throughout; no atrophy noted Sensory: Pinprick and light touch intact throughout, bilaterally Deep Tendon Reflexes: 1+ and symmetric throughout Plantars: Right: downgoing   Left: downgoing Cerebellar: Not tested  Gait: not tested      Laboratory Studies:   Basic Metabolic Panel: Recent Labs  Lab 12/04/18 1128  NA 141  K 2.5*  CL 102  CO2 27  GLUCOSE 117*  BUN 9  CREATININE 0.83  CALCIUM 9.6    Liver Function Tests: Recent Labs  Lab 12/04/18 1128  AST 24  ALT 13  ALKPHOS 79  BILITOT 1.2  PROT 7.0  ALBUMIN 4.4   No results for input(s): LIPASE, AMYLASE in the last 168 hours. No results for input(s):  AMMONIA in the last 168 hours.  CBC: Recent Labs  Lab 12/04/18 1128  WBC 7.2  HGB 12.8  HCT 39.7  MCV 86.5  PLT 247    Cardiac Enzymes: No results for input(s): CKTOTAL, CKMB, CKMBINDEX, TROPONINI in the last 168 hours.  BNP: Invalid input(s): POCBNP  CBG: No results for input(s): GLUCAP in the last 168 hours.  Microbiology: Results for orders placed or performed during the hospital encounter of 09/29/18  SARS Coronavirus 2 (CEPHEID - Performed in [place] hospital lab),  Hosp Order     Status: None   Collection Time: 09/29/18  8:16 PM   Specimen: Nasopharyngeal Swab  Result Value Ref Range Status   SARS Coronavirus 2 NEGATIVE NEGATIVE Final    Comment: (NOTE) If result is NEGATIVE SARS-CoV-2 target nucleic acids are NOT DETECTED. The SARS-CoV-2 RNA is generally detectable in upper and lower  respiratory specimens during the acute phase of infection. The lowest  concentration of SARS-CoV-2 viral copies this assay can detect is 250  copies / mL. A negative result does not preclude SARS-CoV-2 infection  and should not be used as the sole basis for treatment or other  patient management decisions.  A negative result may occur with  improper specimen collection / handling, submission of specimen other  than nasopharyngeal swab, presence of viral mutation(s) within the  areas targeted by this assay, and inadequate number of viral copies  (<250 copies / mL). A negative result must be combined with clinical  observations, patient history, and epidemiological information. If result is POSITIVE SARS-CoV-2 target nucleic acids are DETECTED. The SARS-CoV-2 RNA is generally detectable in upper and lower  respiratory specimens dur ing the acute phase of infection.  Positive  results are indicative of active infection with SARS-CoV-2.  Clinical  correlation with patient history and other diagnostic information is  necessary to determine patient infection status.  Positive results do  not rule out bacterial infection or co-infection with other viruses. If result is PRESUMPTIVE POSTIVE SARS-CoV-2 nucleic acids MAY BE PRESENT.   A presumptive positive result was obtained on the submitted specimen  and confirmed on repeat testing.  While 2019 novel coronavirus  (SARS-CoV-2) nucleic acids may be present in the submitted sample  additional confirmatory testing may be necessary for epidemiological  and / or clinical management purposes  to differentiate between   SARS-CoV-2 and other Sarbecovirus currently known to infect humans.  If clinically indicated additional testing with an alternate test  methodology [phone]) is advised. The SARS-CoV-2 RNA is generally  detectable in upper and lower respiratory sp ecimens during the acute  phase of infection. The expected result is Negative. Fact Sheet for Patients:  StrictlyIdeas.no Fact Sheet for Healthcare Providers: BankingDealers.co.za This test is not yet approved or cleared by the [place] FDA and has been authorized for detection and/or diagnosis of SARS-CoV-2 by FDA under an Emergency Use Authorization (EUA).  This EUA will remain in effect (meaning this test can be used) for the duration of the COVID-19 declaration under Section 564(b)(1) of the Act, 21 U.S.C. section 360bbb-3(b)(1), unless the authorization is terminated or revoked sooner. Performed at [facility], [address] [place], [place] [address]     Coagulation Studies: Recent Labs    12/04/18 1128  LABPROT 14.2  INR 1.1    Urinalysis: No results for input(s): COLORURINE, LABSPEC, PHURINE, GLUCOSEU, HGBUR, BILIRUBINUR, KETONESUR, PROTEINUR, UROBILINOGEN, NITRITE, LEUKOCYTESUR in the last 168 hours.  Invalid input(s): APPERANCEUR  Lipid Panel:  Component Value Date/Time   CHOL 169 02/10/2016 0723   TRIG 50 02/10/2016 0723   HDL 59 02/10/2016 0723   CHOLHDL 2.9 02/10/2016 0723   VLDL 10 02/10/2016 0723   LDLCALC 100 (H) 02/10/2016 0723    HgbA1C:  Lab Results  Component Value Date   HGBA1C 5.4 09/30/2018    Urine Drug Screen:  No results found for: LABOPIA, COCAINSCRNUR, LABBENZ, AMPHETMU, THCU, LABBARB  Alcohol Level: No results for input(s): ETH in the last 168 hours.  Other results: EKG: A-fib with RVR.  Imaging: Ct Head Wo Contrast  Result Date: 12/04/2018 CLINICAL DATA:  Confusion EXAM: CT HEAD WITHOUT CONTRAST TECHNIQUE: Contiguous  axial images were obtained from the base of the skull through the vertex without intravenous contrast. COMPARISON:  None. FINDINGS: Brain: Low-density changes within the right parieto-occipital region suggesting a acute to subacute infarct. No intracranial hemorrhage, mass, hydrocephalus, or midline shift. Small remote lacunar infarct in the left basal ganglia. Scattered low-density changes within the periventricular and subcortical white matter compatible with chronic microvascular ischemic change. Mild diffuse cerebral volume loss. Vascular: Mild atherosclerotic calcifications involving the large vessels of the skull base. No unexpected hyperdense vessel. Skull: Normal. Negative for fracture or focal lesion. Sinuses/Orbits: No acute finding. Other: None. IMPRESSION: 1. 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 Follow-up studies with MRI, as clinically indicated. 2. No acute intracranial hemorrhage. 3. Microvascular ischemic changes and cerebral volume loss. These results were called by telephone at the time of interpretation on 12/04/2018 at 12:42 pm to provider [facility] , who verbally acknowledged these results. Electronically Signed   By: Davina Poke M.D.   On: 12/04/2018 12:45     Assessment/Plan:  73 y.o. female  with a past medical history of atrial fibrillation on elequis, hypertension, presents to the emergency department for altered mental status/confusion.  As per husband over the past 2 weeks the patient has become more more confused and needing daily assistance. He is unsure if she is taking her medications appropriately.  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    - Confusion could be related to subacute stroke which is likely 39-17 weeks old given the findings on the [facility].  - stroke in setting of likely period of hypotension at the same time it could be embolic - Stroke work up under way.   -  Social work as not sure pt's husband is able to take care of her at home - anticoagulation can continue as stroke is not acute.    Leotis Pain  12/04/2018, 1:37 PM

## 2018-12-04 NOTE — Progress Notes (Signed)
Family Meeting Note  Advance Directive:no  Today a meeting took place with the Patient.   The following clinical team members were present during this meeting:MD  The following were discussed:Patient's diagnosis: cva PAF Chronic CHF , Patient's progosis: > 12 months and Goals for treatment: Full Code  Additional follow-up to be provided: chaplain consult to create AD  Time spent during discussion:16 minutes  Bettey Costa, MD

## 2018-12-04 NOTE — ED Triage Notes (Signed)
Patient's husband reports for the last 2 weeks, patient has had worsening altered mental status, forgetting how to do every day things as well as memory impairment. 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 Denies recent head trauma. Also reports swelling in bilateral legs.

## 2018-12-04 NOTE — Progress Notes (Signed)
[facility] called with results of MRA. MD Mody informed of results via secure chat.

## 2018-12-04 NOTE — ED Notes (Signed)
Patient transported to CT 

## 2018-12-04 NOTE — Progress Notes (Signed)
Telemetry called to notify this nurse of pt HR in the 170's. MD made aware. Order for one time dose of 10 mg diltiazem given and Q4H PRN 5mg  Metoprolol. Will give when pharmacy completes order. Will continue to monitor.

## 2018-12-04 NOTE — ED Notes (Addendum)
Per spouse, pt from PCP with c/o increased confusion x1wk. Spouse states recent hx of early  Dementia. PT in NAD

## 2018-12-04 NOTE — Consult Note (Signed)
[place] for Electrolyte Monitoring and Replacement   Recent Labs: Potassium (mmol/L)  Date Value  12/04/2018 2.7 (LL)   Magnesium (mg/dL)  Date Value  12/04/2018 2.0   Calcium (mg/dL)  Date Value  12/04/2018 9.6   Albumin (g/dL)  Date Value  12/04/2018 4.4   Phosphorus (mg/dL)  Date Value  02/10/2016 3.1   Sodium (mmol/L)  Date Value  12/04/2018 141  04/18/2016 144   Assessment: Pharmacy was consulted for magnesium and potassium replacement. No magnesium ordered at this time. Scr is WNL.   Goal of Therapy:  Electrolytes WNL  1.Will order KCL 10 mEq x4 runs and KCL PO 40 mEq x1 dose. Will recheck potassium @ 2000.  Addendum @ [id number]: 3.1 (@ [id number] ). Level was supposed to be rechecked at 2000, but was unintentionally checked early. Lab was called to credited level to account. KCL IV & PO were not given in the ER, per  floor nurse on 1C. Called ED nurse to verify if KCL was given in the ED and she said that it was not. Will cancel KCL IV and adjust KCL PO timing and recheck level.   2. Will order magnesium. Addendum @ [id number]: Mg 2.0. No replacements indicated at this time. Will follow Mg with AM labs   Plan: K 2.7 @ 2152 - Dr Jannifer Franklin ordered KCL 12meq x 4 runs total Will recheck serum K+ 1 hour after last run administered, will recheck Mg in am  Ena Dawley ,PharmD Clinical Pharmacist 12/04/2018 10:36 PM

## 2018-12-04 NOTE — ED Notes (Signed)
Date and time results received: 12/04/18 12:19 PM  (use smartphrase ".now" to insert current time)  Test: Potassium Critical Value: 2.5  Name of Provider Notified: Dr. Kerman Passey  Orders Received? Or Actions Taken?: No new orders at this time

## 2018-12-05 LAB — MAGNESIUM: Magnesium: 1.8 mg/dL (ref 1.7–2.4)

## 2018-12-05 LAB — LIPID PANEL
Cholesterol: 186 mg/dL (ref 0–200)
HDL: 62 mg/dL (ref >40)
LDL Cholesterol: 113 mg/dL — ABNORMAL HIGH (ref 0–99)
Total CHOL/HDL Ratio: 3 RATIO
Triglycerides: 54 mg/dL (ref <150)
VLDL: 11 mg/dL (ref 0–40)

## 2018-12-05 LAB — ECHOCARDIOGRAM COMPLETE
Height: 65 in
Weight: 2352 oz

## 2018-12-05 LAB — POTASSIUM
Potassium: 3 mmol/L — ABNORMAL LOW (ref 3.5–5.1)
Potassium: 3.6 mmol/L (ref 3.5–5.1)

## 2018-12-05 LAB — HEMOGLOBIN A1C
Hgb A1c MFr Bld: 5.2 % (ref 4.8–5.6)
Mean Plasma Glucose: 102.54 mg/dL

## 2018-12-05 MED ORDER — MAGNESIUM SULFATE 2 GM/50ML IV SOLN
2.0000 g | Freq: Once | INTRAVENOUS | Status: AC
  Administered 2018-12-05: 09:00:00 2 g via INTRAVENOUS
  Filled 2018-12-05: qty 50

## 2018-12-05 MED ORDER — ASPIRIN EC 81 MG PO TBEC
81.0000 mg | DELAYED_RELEASE_TABLET | Freq: Every day | ORAL | Status: DC
  Administered 2018-12-05 – 2018-12-09 (×5): 81 mg via ORAL
  Filled 2018-12-05 (×6): qty 1

## 2018-12-05 MED ORDER — POTASSIUM CHLORIDE 10 MEQ/100ML IV SOLN
10.0000 meq | INTRAVENOUS | Status: AC
  Administered 2018-12-05 (×4): 10 meq via INTRAVENOUS
  Filled 2018-12-05 (×4): qty 100

## 2018-12-05 NOTE — Progress Notes (Signed)
S/p vessel imaging and patient has significant intacranial stenosis.    Past Medical History:  Diagnosis Date  . A-fib ([place])   . Bowel trouble 2012   ocasionally  . Cancer ([place]) 10/2000   pt had 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 Pt then had a repeat excision on 11/16/2000  . Hypertension 2006  . Personal history of malignant neoplasm of breast 2002  . Solitary cyst of breast 2012  . Special screening for malignant neoplasms, colon     Past Surgical History:  Procedure Laterality Date  . BREAST BIOPSY Right 2002  . BREAST SURGERY Right 2002   wide excision and sn x4 done 10/29/00 with repeat excision done on 11/16/2000 with post operative radiation treatment and 5 years of tamoxifen with completion in 2007  . CHOLECYSTECTOMY  2002  . COLONOSCOPY  2008  . DILATION AND CURETTAGE OF UTERUS  1990,s  . ELECTROPHYSIOLOGIC STUDY N/A 03/16/2016   Procedure: CARDIOVERSION;  Surgeon: Isaias Cowman, MD;  Location: [facility] ORS;  Service: Cardiovascular;  Laterality: N/A;    Family History  Problem Relation Age of Onset  . Cancer Other        breast, relationship not listed    Social History:  reports that she has never smoked. She has never used smokeless tobacco. She reports that she does not drink alcohol or use drugs.  No Known Allergies  Medications: I have reviewed the patient's current medications.  ROS: Unable to obtain due to confusion   Physical Examination: Blood pressure 139/65, pulse 76, temperature 98.5 F (36.9 C), temperature source Oral, resp. rate 18, height 5\' 5"  (1.651 m), weight 66.7 kg, SpO2 95 %.    Neurological Examination   Mental Status: Alert to name and location. Confusion/disorientation Cranial Nerves: II: Discs flat bilaterally; Visual fields grossly normal, pupils equal, round, reactive to light and accommodation III,IV, VI: ptosis not present, extra-ocular motions intact bilaterally V,VII:  smile symmetric, facial light touch sensation normal bilaterally VIII: hearing normal bilaterally IX,X: gag reflex present XI: bilateral shoulder shrug XII: midline tongue extension Motor: Right : Upper extremity   5/5    Left:     Upper extremity   5/5  Lower extremity   5/5     Lower extremity   5/5 Tone and bulk:normal tone throughout; no atrophy noted Sensory: Pinprick and light touch intact throughout, bilaterally Deep Tendon Reflexes: 1+ and symmetric throughout Plantars: Right: downgoing   Left: downgoing Cerebellar: Not tested  Gait: not tested      Laboratory Studies:   Basic Metabolic Panel: Recent Labs  Lab 12/04/18 1128 12/04/18 1458 12/04/18 2152 12/05/18 0544 12/05/18 1237  NA 141  --   --   --   --   K 2.5* 3.1* 2.7* 3.0* 3.6  CL 102  --   --   --   --   CO2 27  --   --   --   --   GLUCOSE 117*  --   --   --   --   BUN 9  --   --   --   --   CREATININE 0.83  --   --   --   --   CALCIUM 9.6  --   --   --   --   MG  --  2.0  --  1.8  --     Liver Function Tests: Recent Labs  Lab 12/04/18 1128  AST 24  ALT 13  ALKPHOS 79  BILITOT 1.2  PROT 7.0  ALBUMIN 4.4   No results for input(s): LIPASE, AMYLASE in the last 168 hours. No results for input(s): AMMONIA in the last 168 hours.  CBC: Recent Labs  Lab 12/04/18 1128  WBC 7.2  HGB 12.8  HCT 39.7  MCV 86.5  PLT 247    Cardiac Enzymes: No results for input(s): CKTOTAL, CKMB, CKMBINDEX, TROPONINI in the last 168 hours.  BNP: Invalid input(s): POCBNP  CBG: No results for input(s): GLUCAP in the last 168 hours.  Microbiology: Results for orders placed or performed during the hospital encounter of 12/04/18  SARS CORONAVIRUS 2 (TAT 6-24 HRS) Nasopharyngeal Nasopharyngeal Swab     Status: None   Collection Time: 12/04/18 12:53 PM   Specimen: Nasopharyngeal Swab  Result Value Ref Range Status   SARS Coronavirus 2 NEGATIVE NEGATIVE Final    Comment: (NOTE) SARS-CoV-2 target nucleic acids  are NOT DETECTED. The SARS-CoV-2 RNA is generally detectable in upper and lower respiratory specimens during the acute phase of infection. Negative results do not preclude SARS-CoV-2 infection, do not rule out co-infections with other pathogens, and should not be used as the sole basis for treatment or other patient management decisions. Negative results must be combined with clinical observations, patient history, and epidemiological information. The expected result is Negative. Fact Sheet for Patients: SugarRoll.be Fact Sheet for Healthcare Providers: https://www.woods-mathews.com/ This test is not yet approved or cleared by the [place] FDA and  has been authorized for detection and/or diagnosis of SARS-CoV-2 by FDA under an Emergency Use Authorization (EUA). This EUA will remain  in effect (meaning this test can be used) for the duration of the COVID-19 declaration under Section 56 4(b)(1) of the Act, 21 U.S.C. section 360bbb-3(b)(1), unless the authorization is terminated or revoked sooner. Performed at [facility], [place] [address], [place], [place] [address]     Coagulation Studies: Recent Labs    12/04/18 1128  LABPROT 14.2  INR 1.1    Urinalysis:  Recent Labs  Lab 12/04/18 1218  COLORURINE YELLOW*  LABSPEC 1.005  PHURINE 7.0  GLUCOSEU NEGATIVE  HGBUR NEGATIVE  BILIRUBINUR NEGATIVE  KETONESUR NEGATIVE  PROTEINUR NEGATIVE  NITRITE NEGATIVE  LEUKOCYTESUR NEGATIVE    Lipid Panel:     Component Value Date/Time   CHOL 186 12/05/2018 0544   TRIG 54 12/05/2018 0544   HDL 62 12/05/2018 0544   CHOLHDL 3.0 12/05/2018 0544   VLDL 11 12/05/2018 0544   LDLCALC 113 (H) 12/05/2018 0544    HgbA1C:  Lab Results  Component Value Date   HGBA1C 5.2 12/05/2018    Urine Drug Screen:  No results found for: LABOPIA, COCAINSCRNUR, LABBENZ, AMPHETMU, THCU, LABBARB  Alcohol Level: No results for input(s): ETH in the  last 168 hours.  Other results: EKG: A-fib with RVR.  Imaging: Ct Head Wo Contrast  Result Date: 12/04/2018 CLINICAL DATA:  Confusion EXAM: CT HEAD WITHOUT CONTRAST TECHNIQUE: Contiguous axial images were obtained from the base of the skull through the vertex without intravenous contrast. COMPARISON:  None. FINDINGS: Brain: Low-density changes within the right parieto-occipital region suggesting a acute to subacute infarct. No intracranial hemorrhage, mass, hydrocephalus, or midline shift. Small remote lacunar infarct in the left basal ganglia. Scattered low-density changes within the periventricular and subcortical white matter compatible with chronic microvascular ischemic change. Mild diffuse cerebral volume loss. Vascular: Mild atherosclerotic calcifications involving the large vessels of the skull base. No unexpected hyperdense vessel. Skull: Normal.  Negative for fracture or focal lesion. Sinuses/Orbits: No acute finding. Other: None. IMPRESSION: 1. 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 Follow-up studies with MRI, as clinically indicated. 2. No acute intracranial hemorrhage. 3. Microvascular ischemic changes and cerebral volume loss. These results were called by telephone at the time of interpretation on 12/04/2018 at 12:42 pm to provider [facility] , who verbally acknowledged these results. Electronically Signed   By: Davina Poke M.D.   On: 12/04/2018 12:45   Mr Angio Head Wo Contrast  Result Date: 12/04/2018 CLINICAL DATA:  Stroke, follow-up. Additional history provided: 73 year old female with history of atrial fibrillation on Eliquis, hypertension presents to ED for altered mental status/confusion. EXAM: MRI HEAD WITHOUT CONTRAST MRA HEAD WITHOUT CONTRAST TECHNIQUE: Multiplanar, multiecho pulse sequences of the brain and surrounding structures were obtained without intravenous contrast. Angiographic images of  the head were obtained using MRA technique without contrast. COMPARISON:  None. Noncontrast head CT performed earlier the same day 12/04/2018. FINDINGS: MRI HEAD FINDINGS Brain: Multiple sequences are significantly motion degraded, limiting evaluation There is a moderate-size region of cortical/subcortical restricted diffusion within the right parietal lobe consistent with acute infarct. An additional small acute cortical/subcortical infarct is also present within the right occipital lobe. Associated T2/FLAIR hyperintensity at these sites. Mild regional mass effect without midline shift or significant effacement of the ventricular system. No evidence of intracranial mass. No extra-axial fluid collection. No chronic intracranial blood products. Moderate scattered and confluent T2/FLAIR hyperintensity within the cerebral white matter is nonspecific, but consistent with chronic small vessel ischemic disease. Mild generalized parenchymal atrophy. Vascular: Reported separately Skull and upper cervical spine: Incompletely assessed cervical spondylosis. A C3-C4 posterior disc osteophyte results in at least mild spinal canal narrowing. Sinuses/Orbits: Visualized orbits demonstrate no acute abnormality. No significant paranasal sinus disease or mastoid effusion. MRA HEAD FINDINGS The examination is motion degraded, limiting evaluation for intracranial arterial stenoses and for small aneurysms. The bilateral intracranial internal carotid arteries are patent. There is an apparent high-grade narrowing of the petrous left ICA at the level of the skull base, although this may be at least partially related to artifact arising from the skull base. There are two focal stenoses within the cavernous left ICA, up to moderate in severity. No more than mild luminal narrowing of the intracranial right ICA. The right middle and anterior cerebral arteries are patent. There is apparent moderate stenosis of the distal M1 right middle cerebral  artery and within an adjacent proximal inferior division M2 branch. Motion degradation limits evaluation of the M2 and more distal MCA branches. The left middle and anterior cerebral arteries are patent. Significant motion degradation limits evaluation of the M2 and more distal left MCA branches. Apparent at least moderate proximal stenosis within the M2 superior division. No intracranial aneurysm is identified. The visualized intracranial vertebral arteries are patent without significant stenosis. The basilar artery is patent without significant stenosis. The bilateral posterior cerebral arteries are patent. No significant proximal stenosis within the right posterior cerebral artery. Moderate/severe focal stenosis within the P1 left posterior cerebral artery. Moderate focal stenosis within the P2 left posterior cerebral artery. There is also suggestion of significant atherosclerotic irregularity within the distal P2 and more distal left PCA segments. Findings under the MRA impression will be called to the ordering clinician or representative by the Radiologist Assistant, and communication documented in the PACS or zVision Dashboard. IMPRESSION: MRI brain: 1. Moderate-sized acute cortical/subcortical right MCA territory parietal infarction. 2. Separate  smaller acute cortical/subcortical infarct within the right occipital lobe. 3. Mild regional mass effect without midline shift or significant effacement of the ventricular system. 4. Generalized parenchymal atrophy with moderate chronic small vessel ischemic disease. 5. Incompletely assessed cervical spondylosis. MRA head: 1. Examination is significantly limited by motion artifact. 2. Multifocal intracranial arterial stenoses, presumably on the basis of atherosclerotic disease, most notably as follows. 3. Apparent moderate stenosis within the distal M1 right MCA and within an adjacent proximal inferior division M2 branch. 4. Apparent high-grade narrowing of the  petrous left ICA at the level of the skull base. However, this stenosis may be overestimated due to artifact arising from the skull base. 5. Two focal stenosis within the cavernous left ICA, up to moderate in severity. 6. Apparent at least moderate stenosis within the proximal left M2 superior division. 7. Moderate/severe stenosis within the P1 left PCA. Moderate focal stenosis within the P2 left PCA. There is also suggestion of significant atherosclerotic irregularity within the distal P2 and more distal left PCA. Electronically Signed   By: Kellie Simmering   On: 12/04/2018 18:02   Mr Brain Wo Contrast  Result Date: 12/04/2018 CLINICAL DATA:  Stroke, follow-up. Additional history provided: 73 year old female with history of atrial fibrillation on Eliquis, hypertension presents to ED for altered mental status/confusion. EXAM: MRI HEAD WITHOUT CONTRAST MRA HEAD WITHOUT CONTRAST TECHNIQUE: Multiplanar, multiecho pulse sequences of the brain and surrounding structures were obtained without intravenous contrast. Angiographic images of the head were obtained using MRA technique without contrast. COMPARISON:  None. Noncontrast head CT performed earlier the same day 12/04/2018. FINDINGS: MRI HEAD FINDINGS Brain: Multiple sequences are significantly motion degraded, limiting evaluation There is a moderate-size region of cortical/subcortical restricted diffusion within the right parietal lobe consistent with acute infarct. An additional small acute cortical/subcortical infarct is also present within the right occipital lobe. Associated T2/FLAIR hyperintensity at these sites. Mild regional mass effect without midline shift or significant effacement of the ventricular system. No evidence of intracranial mass. No extra-axial fluid collection. No chronic intracranial blood products. Moderate scattered and confluent T2/FLAIR hyperintensity within the cerebral white matter is nonspecific, but consistent with chronic small vessel  ischemic disease. Mild generalized parenchymal atrophy. Vascular: Reported separately Skull and upper cervical spine: Incompletely assessed cervical spondylosis. A C3-C4 posterior disc osteophyte results in at least mild spinal canal narrowing. Sinuses/Orbits: Visualized orbits demonstrate no acute abnormality. No significant paranasal sinus disease or mastoid effusion. MRA HEAD FINDINGS The examination is motion degraded, limiting evaluation for intracranial arterial stenoses and for small aneurysms. The bilateral intracranial internal carotid arteries are patent. There is an apparent high-grade narrowing of the petrous left ICA at the level of the skull base, although this may be at least partially related to artifact arising from the skull base. There are two focal stenoses within the cavernous left ICA, up to moderate in severity. No more than mild luminal narrowing of the intracranial right ICA. The right middle and anterior cerebral arteries are patent. There is apparent moderate stenosis of the distal M1 right middle cerebral artery and within an adjacent proximal inferior division M2 branch. Motion degradation limits evaluation of the M2 and more distal MCA branches. The left middle and anterior cerebral arteries are patent. Significant motion degradation limits evaluation of the M2 and more distal left MCA branches. Apparent at least moderate proximal stenosis within the M2 superior division. No intracranial aneurysm is identified. The visualized intracranial vertebral arteries are patent without significant stenosis. The basilar artery is patent  without significant stenosis. The bilateral posterior cerebral arteries are patent. No significant proximal stenosis within the right posterior cerebral artery. Moderate/severe focal stenosis within the P1 left posterior cerebral artery. Moderate focal stenosis within the P2 left posterior cerebral artery. There is also suggestion of significant atherosclerotic  irregularity within the distal P2 and more distal left PCA segments. Findings under the MRA impression will be called to the ordering clinician or representative by the Radiologist Assistant, and communication documented in the PACS or zVision Dashboard. IMPRESSION: MRI brain: 1. Moderate-sized acute cortical/subcortical right MCA territory parietal infarction. 2. Separate smaller acute cortical/subcortical infarct within the right occipital lobe. 3. Mild regional mass effect without midline shift or significant effacement of the ventricular system. 4. Generalized parenchymal atrophy with moderate chronic small vessel ischemic disease. 5. Incompletely assessed cervical spondylosis. MRA head: 1. Examination is significantly limited by motion artifact. 2. Multifocal intracranial arterial stenoses, presumably on the basis of atherosclerotic disease, most notably as follows. 3. Apparent moderate stenosis within the distal M1 right MCA and within an adjacent proximal inferior division M2 branch. 4. Apparent high-grade narrowing of the petrous left ICA at the level of the skull base. However, this stenosis may be overestimated due to artifact arising from the skull base. 5. Two focal stenosis within the cavernous left ICA, up to moderate in severity. 6. Apparent at least moderate stenosis within the proximal left M2 superior division. 7. Moderate/severe stenosis within the P1 left PCA. Moderate focal stenosis within the P2 left PCA. There is also suggestion of significant atherosclerotic irregularity within the distal P2 and more distal left PCA. Electronically Signed   By: Kellie Simmering   On: 12/04/2018 18:02   US Carotid Bilateral (at [facility] And [facility] Only)  Result Date: 12/05/2018 CLINICAL DATA:  73 year old female with a history of cerebrovascular accident EXAM: BILATERAL CAROTID DUPLEX ULTRASOUND TECHNIQUE: Pearline Cables scale imaging, color Doppler and duplex ultrasound were performed of bilateral carotid and vertebral  arteries in the neck. COMPARISON:  None. FINDINGS: Criteria: Quantification of carotid stenosis is based on velocity parameters that correlate the residual internal carotid diameter with NASCET-based stenosis levels, using the diameter of the distal internal carotid lumen as the denominator for stenosis measurement. The following velocity measurements were obtained: RIGHT ICA:  Systolic 65 cm/sec, Diastolic 16 cm/sec CCA:  82 cm/sec SYSTOLIC ICA/CCA RATIO:  0.8 ECA:  133 cm/sec LEFT ICA:  Systolic 80 cm/sec, Diastolic 14 cm/sec CCA:  70 cm/sec SYSTOLIC ICA/CCA RATIO:  1.1 ECA:  66 cm/sec Right Brachial SBP: Not acquired Left Brachial SBP: Not acquired RIGHT CAROTID ARTERY: No significant calcifications of the right common carotid artery. Intermediate waveform maintained. Heterogeneous and partially calcified plaque at the right carotid bifurcation. No significant lumen shadowing. Low resistance waveform of the right ICA. No significant tortuosity. RIGHT VERTEBRAL ARTERY: Antegrade flow with low resistance waveform. LEFT CAROTID ARTERY: No significant calcifications of the left common carotid artery. Intermediate waveform maintained. Heterogeneous and partially calcified plaque at the left carotid bifurcation without significant lumen shadowing. Low resistance waveform of the left ICA. No significant tortuosity. LEFT VERTEBRAL ARTERY:  Antegrade flow with low resistance waveform. IMPRESSION: Color duplex indicates minimal heterogeneous plaque, with no hemodynamically significant stenosis by duplex criteria in the extracranial cerebrovascular circulation. Signed, Dulcy Fanny. Dellia Nims, RPVI [facility] Electronically Signed   By: Corrie Mckusick D.O.   On: 12/05/2018 08:05     Assessment/Plan:  73 y.o. female  with a past medical history of atrial  fibrillation on elequis, hypertension, presents to the emergency department for altered mental status/confusion.   As per husband over the past 2 weeks the patient has become more more confused and needing daily assistance. He is unsure if she is taking her medications appropriately.  [place] R watershed infarct in the R MCA and PCA territory.    - Confusion could be related to subacute stroke which is likely 76-89 weeks old given the findings on the [facility].  -MRI with PCA stroke and significant intracranial stenosis more so on R side.   - Con't anticoagulation and will add on ASA daily   Norma Hill  12/05/2018, 1:07 PM

## 2018-12-05 NOTE — Progress Notes (Signed)
[facility] NAME: Jayelyn Banther    MR#:  [id number]  DATE OF BIRTH:  08-26-1945  SUBJECTIVE:  CHIEF COMPLAINT:   Chief Complaint  Patient presents with   Altered Mental Status   Tachycardia   No new complaint this morning.  No fevers.  Husband at bedside.  Updated on treatment plans.  Reported confusion appears to be improving.  REVIEW OF SYSTEMS:  Review of Systems  Constitutional: Negative for chills and fever.  HENT: Negative for hearing loss and tinnitus.   Eyes: Negative for blurred vision and double vision.  Respiratory: Negative for cough and hemoptysis.   Cardiovascular: Negative for chest pain and palpitations.  Gastrointestinal: Negative for heartburn, nausea and vomiting.  Genitourinary: Negative for dysuria.  Skin: Negative for itching and rash.  Neurological: Negative for dizziness and focal weakness.       Reported confusion appears to be improving.  Psychiatric/Behavioral: Negative for depression and hallucinations.    DRUG ALLERGIES:  No Known Allergies VITALS:  Blood pressure (!) 141/69, pulse 78, temperature 97.9 F (36.6 C), resp. rate 18, height 5\' 5"  (1.651 m), weight 66.7 kg, SpO2 97 %. PHYSICAL EXAMINATION:  Physical Exam   Constitutional:      General: She is not in acute distress. HENT:     Head: Normocephalic.  Eyes:     General: No scleral icterus. Neck:     Musculoskeletal: Normal range of motion and neck supple.     Vascular: No JVD.     Trachea: No tracheal deviation.  Cardiovascular:     Rate and Rhythm: Normal rate and regular rhythm.     Heart sounds: Normal heart sounds. No murmur. No friction rub. No gallop.   Pulmonary:     Effort: Pulmonary effort is normal. No respiratory distress.     Breath sounds: Normal breath sounds. No wheezing or rales.  Chest:     Chest wall: No tenderness.  Abdominal:     General: Bowel sounds are normal. There is no distension.   Palpations: Abdomen is soft. There is no mass.     Tenderness: There is no abdominal tenderness. There is no guarding or rebound.  Musculoskeletal: Normal range of motion.  Skin:    General: Skin is warm.     Findings: No erythema or rash.  Neurological:     Mental Status: She is alert and oriented to person, place but disoriented to time. Psychiatric:        Judgment: Judgment normal.   LABORATORY PANEL:  Female CBC Recent Labs  Lab 12/04/18 1128  WBC 7.2  HGB 12.8  HCT 39.7  PLT 247   ------------------------------------------------------------------------------------------------------------------ Chemistries  Recent Labs  Lab 12/04/18 1128  12/05/18 0544 12/05/18 1237  NA 141  --   --   --   K 2.5*   < > 3.0* 3.6  CL 102  --   --   --   CO2 27  --   --   --   GLUCOSE 117*  --   --   --   BUN 9  --   --   --   CREATININE 0.83  --   --   --   CALCIUM 9.6  --   --   --   MG  --    < > 1.8  --   AST 24  --   --   --   ALT 13  --   --   --  ALKPHOS 79  --   --   --   BILITOT 1.2  --   --   --    < > = values in this interval not displayed.   RADIOLOGY:  Mr Angio Head Wo Contrast  Result Date: 12/04/2018 CLINICAL DATA:  Stroke, follow-up. Additional history provided: 73 year old female with history of atrial fibrillation on Eliquis, hypertension presents to ED for altered mental status/confusion. EXAM: MRI HEAD WITHOUT CONTRAST MRA HEAD WITHOUT CONTRAST TECHNIQUE: Multiplanar, multiecho pulse sequences of the brain and surrounding structures were obtained without intravenous contrast. Angiographic images of the head were obtained using MRA technique without contrast. COMPARISON:  None. Noncontrast head CT performed earlier the same day 12/04/2018. FINDINGS: MRI HEAD FINDINGS Brain: Multiple sequences are significantly motion degraded, limiting evaluation There is a moderate-size region of cortical/subcortical restricted diffusion within the right parietal lobe consistent  with acute infarct. An additional small acute cortical/subcortical infarct is also present within the right occipital lobe. Associated T2/FLAIR hyperintensity at these sites. Mild regional mass effect without midline shift or significant effacement of the ventricular system. No evidence of intracranial mass. No extra-axial fluid collection. No chronic intracranial blood products. Moderate scattered and confluent T2/FLAIR hyperintensity within the cerebral white matter is nonspecific, but consistent with chronic small vessel ischemic disease. Mild generalized parenchymal atrophy. Vascular: Reported separately Skull and upper cervical spine: Incompletely assessed cervical spondylosis. A C3-C4 posterior disc osteophyte results in at least mild spinal canal narrowing. Sinuses/Orbits: Visualized orbits demonstrate no acute abnormality. No significant paranasal sinus disease or mastoid effusion. MRA HEAD FINDINGS The examination is motion degraded, limiting evaluation for intracranial arterial stenoses and for small aneurysms. The bilateral intracranial internal carotid arteries are patent. There is an apparent high-grade narrowing of the petrous left ICA at the level of the skull base, although this may be at least partially related to artifact arising from the skull base. There are two focal stenoses within the cavernous left ICA, up to moderate in severity. No more than mild luminal narrowing of the intracranial right ICA. The right middle and anterior cerebral arteries are patent. There is apparent moderate stenosis of the distal M1 right middle cerebral artery and within an adjacent proximal inferior division M2 branch. Motion degradation limits evaluation of the M2 and more distal MCA branches. The left middle and anterior cerebral arteries are patent. Significant motion degradation limits evaluation of the M2 and more distal left MCA branches. Apparent at least moderate proximal stenosis within the M2 superior  division. No intracranial aneurysm is identified. The visualized intracranial vertebral arteries are patent without significant stenosis. The basilar artery is patent without significant stenosis. The bilateral posterior cerebral arteries are patent. No significant proximal stenosis within the right posterior cerebral artery. Moderate/severe focal stenosis within the P1 left posterior cerebral artery. Moderate focal stenosis within the P2 left posterior cerebral artery. There is also suggestion of significant atherosclerotic irregularity within the distal P2 and more distal left PCA segments. Findings under the MRA impression will be called to the ordering clinician or representative by the Radiologist Assistant, and communication documented in the PACS or zVision Dashboard. IMPRESSION: MRI brain: 1. Moderate-sized acute cortical/subcortical right MCA territory parietal infarction. 2. Separate smaller acute cortical/subcortical infarct within the right occipital lobe. 3. Mild regional mass effect without midline shift or significant effacement of the ventricular system. 4. Generalized parenchymal atrophy with moderate chronic small vessel ischemic disease. 5. Incompletely assessed cervical spondylosis. MRA head: 1. Examination is significantly limited by motion artifact. 2. Multifocal  intracranial arterial stenoses, presumably on the basis of atherosclerotic disease, most notably as follows. 3. Apparent moderate stenosis within the distal M1 right MCA and within an adjacent proximal inferior division M2 branch. 4. Apparent high-grade narrowing of the petrous left ICA at the level of the skull base. However, this stenosis may be overestimated due to artifact arising from the skull base. 5. Two focal stenosis within the cavernous left ICA, up to moderate in severity. 6. Apparent at least moderate stenosis within the proximal left M2 superior division. 7. Moderate/severe stenosis within the P1 left PCA. Moderate focal  stenosis within the P2 left PCA. There is also suggestion of significant atherosclerotic irregularity within the distal P2 and more distal left PCA. Electronically Signed   By: Kellie Simmering   On: 12/04/2018 18:02   Mr Brain Wo Contrast  Result Date: 12/04/2018 CLINICAL DATA:  Stroke, follow-up. Additional history provided: 73 year old female with history of atrial fibrillation on Eliquis, hypertension presents to ED for altered mental status/confusion. EXAM: MRI HEAD WITHOUT CONTRAST MRA HEAD WITHOUT CONTRAST TECHNIQUE: Multiplanar, multiecho pulse sequences of the brain and surrounding structures were obtained without intravenous contrast. Angiographic images of the head were obtained using MRA technique without contrast. COMPARISON:  None. Noncontrast head CT performed earlier the same day 12/04/2018. FINDINGS: MRI HEAD FINDINGS Brain: Multiple sequences are significantly motion degraded, limiting evaluation There is a moderate-size region of cortical/subcortical restricted diffusion within the right parietal lobe consistent with acute infarct. An additional small acute cortical/subcortical infarct is also present within the right occipital lobe. Associated T2/FLAIR hyperintensity at these sites. Mild regional mass effect without midline shift or significant effacement of the ventricular system. No evidence of intracranial mass. No extra-axial fluid collection. No chronic intracranial blood products. Moderate scattered and confluent T2/FLAIR hyperintensity within the cerebral white matter is nonspecific, but consistent with chronic small vessel ischemic disease. Mild generalized parenchymal atrophy. Vascular: Reported separately Skull and upper cervical spine: Incompletely assessed cervical spondylosis. A C3-C4 posterior disc osteophyte results in at least mild spinal canal narrowing. Sinuses/Orbits: Visualized orbits demonstrate no acute abnormality. No significant paranasal sinus disease or mastoid effusion.  MRA HEAD FINDINGS The examination is motion degraded, limiting evaluation for intracranial arterial stenoses and for small aneurysms. The bilateral intracranial internal carotid arteries are patent. There is an apparent high-grade narrowing of the petrous left ICA at the level of the skull base, although this may be at least partially related to artifact arising from the skull base. There are two focal stenoses within the cavernous left ICA, up to moderate in severity. No more than mild luminal narrowing of the intracranial right ICA. The right middle and anterior cerebral arteries are patent. There is apparent moderate stenosis of the distal M1 right middle cerebral artery and within an adjacent proximal inferior division M2 branch. Motion degradation limits evaluation of the M2 and more distal MCA branches. The left middle and anterior cerebral arteries are patent. Significant motion degradation limits evaluation of the M2 and more distal left MCA branches. Apparent at least moderate proximal stenosis within the M2 superior division. No intracranial aneurysm is identified. The visualized intracranial vertebral arteries are patent without significant stenosis. The basilar artery is patent without significant stenosis. The bilateral posterior cerebral arteries are patent. No significant proximal stenosis within the right posterior cerebral artery. Moderate/severe focal stenosis within the P1 left posterior cerebral artery. Moderate focal stenosis within the P2 left posterior cerebral artery. There is also suggestion of significant atherosclerotic irregularity within the distal P2  and more distal left PCA segments. Findings under the MRA impression will be called to the ordering clinician or representative by the Radiologist Assistant, and communication documented in the PACS or zVision Dashboard. IMPRESSION: MRI brain: 1. Moderate-sized acute cortical/subcortical right MCA territory parietal infarction. 2. Separate  smaller acute cortical/subcortical infarct within the right occipital lobe. 3. Mild regional mass effect without midline shift or significant effacement of the ventricular system. 4. Generalized parenchymal atrophy with moderate chronic small vessel ischemic disease. 5. Incompletely assessed cervical spondylosis. MRA head: 1. Examination is significantly limited by motion artifact. 2. Multifocal intracranial arterial stenoses, presumably on the basis of atherosclerotic disease, most notably as follows. 3. Apparent moderate stenosis within the distal M1 right MCA and within an adjacent proximal inferior division M2 branch. 4. Apparent high-grade narrowing of the petrous left ICA at the level of the skull base. However, this stenosis may be overestimated due to artifact arising from the skull base. 5. Two focal stenosis within the cavernous left ICA, up to moderate in severity. 6. Apparent at least moderate stenosis within the proximal left M2 superior division. 7. Moderate/severe stenosis within the P1 left PCA. Moderate focal stenosis within the P2 left PCA. There is also suggestion of significant atherosclerotic irregularity within the distal P2 and more distal left PCA. Electronically Signed   By: Kellie Simmering   On: 12/04/2018 18:02   US Carotid Bilateral (at [facility] Only)  Result Date: 12/05/2018 CLINICAL DATA:  73 year old female with a history of cerebrovascular accident EXAM: BILATERAL CAROTID DUPLEX ULTRASOUND TECHNIQUE: Pearline Cables scale imaging, color Doppler and duplex ultrasound were performed of bilateral carotid and vertebral arteries in the neck. COMPARISON:  None. FINDINGS: Criteria: Quantification of carotid stenosis is based on velocity parameters that correlate the residual internal carotid diameter with NASCET-based stenosis levels, using the diameter of the distal internal carotid lumen as the denominator for stenosis measurement. The following velocity measurements were obtained: RIGHT ICA:   Systolic 65 cm/sec, Diastolic 16 cm/sec CCA:  82 cm/sec SYSTOLIC ICA/CCA RATIO:  0.8 ECA:  133 cm/sec LEFT ICA:  Systolic 80 cm/sec, Diastolic 14 cm/sec CCA:  70 cm/sec SYSTOLIC ICA/CCA RATIO:  1.1 ECA:  66 cm/sec Right Brachial SBP: Not acquired Left Brachial SBP: Not acquired RIGHT CAROTID ARTERY: No significant calcifications of the right common carotid artery. Intermediate waveform maintained. Heterogeneous and partially calcified plaque at the right carotid bifurcation. No significant lumen shadowing. Low resistance waveform of the right ICA. No significant tortuosity. RIGHT VERTEBRAL ARTERY: Antegrade flow with low resistance waveform. LEFT CAROTID ARTERY: No significant calcifications of the left common carotid artery. Intermediate waveform maintained. Heterogeneous and partially calcified plaque at the left carotid bifurcation without significant lumen shadowing. Low resistance waveform of the left ICA. No significant tortuosity. LEFT VERTEBRAL ARTERY:  Antegrade flow with low resistance waveform. IMPRESSION: Color duplex indicates minimal heterogeneous plaque, with no hemodynamically significant stenosis by duplex criteria in the extracranial cerebrovascular circulation. Signed, Dulcy Fanny. Dellia Nims, RPVI [facility] Radiology Electronically Signed   By: Corrie Mckusick D.O.   On: 12/05/2018 08:05   ASSESSMENT AND PLAN:    73 y/o female with PAF and chronic diastolic heart failure pEF by ECHO 09/2018 who presents to the emergency room due to 2 weeks of increased confusion.  1. Acute to subacute appearing infarct in the right parieto-occipital region (right MCA-PCA watershed distribution): As per husband over the past 2 weeks the patient has become more more confused and needing daily  assistance. He is unsure if she is taking her medications appropriately. MRA head with multifocal intracranial arterial stenoses - Confusion could be related to subacute  stroke which is likely 63-17 weeks old given the findings on the [facility].  Carotid Doppler ultrasound with no hemodynamically significant stenosis. -MRI with PCA stroke and significant intracranial stenosis more so on R side.   -Appreciate neurology input.  Recommendation is to continue anticoagulation with Eliquis.  Low-dose aspirin added.  PT/OT/Speech consult Glycosylated hemoglobin level of 5.2  2. PAF: OK to continue Diltiazem since symptoms have been going on for longer then a few days. Continue Eliquis Tele monitor  3. Hypokalemia: Repleted  Magnesium level okay  4. Chronic Diastolic heart failure:  No signs of exacerbation  DVT prophylaxis; Eliquis  All the records are reviewed and case discussed with Care Management/Social Worker. Management plans discussed with the patient, family and they are in agreement.  Updated husband present at bedside on treatment plans.   CODE STATUS: Full Code  TOTAL TIME TAKING CARE OF THIS PATIENT: 35 minutes.   More than 50% of the time was spent in counseling/coordination of care: YES  POSSIBLE D/C IN 2 DAYS, DEPENDING ON CLINICAL CONDITION.   Kassie Keng M.D on 12/05/2018 at 3:33 PM  Between 7am to 6pm - Pager - [phone]  After 6pm go to [email or web address] - Proofreader  [facility]  Office  [phone]  CC: Primary care physician; Guadalupe Maple, MD  Note: This dictation was prepared with Dragon dictation along with smaller phrase technology. Any transcriptional errors that result from this process are unintentional.

## 2018-12-05 NOTE — Evaluation (Signed)
Physical Therapy Evaluation Patient Details Name: Norma Hill MRN: [id number] DOB: [birth year] Today's Date: 12/05/2018   History of Present Illness  73 y.o. female  with a past medical history of atrial fibrillation on elequis, hypertension, presents to the emergency department for altered mental status/confusion.  As per husband over the past 2 weeks the patient has become more more confused and needing daily assistance. He is unsure if she is taking her medications appropriately.  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    Clinical Impression  Pt eager to participate with PT and generally speaking did well.  However she is not at all at her baseline and displayed coordination and strength issues (R>L) and had some mild confusion consistently t/o the session. Husband present and confirms that this is not her normal (but better now than last week).  She was able to ambulate the nurses' station w/o AD and w/o overt safety issues but again was not at her baseline, and had some inconsistency that merits further PT interventions, recommending outpatient PT - husband can transport.    Follow Up Recommendations Outpatient PT    Equipment Recommendations  None recommended by PT    Recommendations for Other Services       Precautions / Restrictions Precautions Precautions: Fall Restrictions Weight Bearing Restrictions: No      Mobility  Bed Mobility Overal bed mobility: Independent             General bed mobility comments: Pt was able to attain sitting at EOB w/o assist or hesitation  Transfers Overall transfer level: Modified independent Equipment used: None             General transfer comment: Pt was able to rise w/o assist and did not excessively need UEs to attain standing  Ambulation/Gait Ambulation/Gait assistance: Supervision Gait Distance (Feet): 200 Feet Assistive device: None       General Gait Details: Pt initially with  some minimal guarding/hesitation, but quickly was able to assume consistent and community appropriate cadence and speed.  No LOBs, still subjectively not quite at baseline.   Stairs            Wheelchair Mobility    Modified Rankin (Stroke Patients Only)       Balance Overall balance assessment: Modified Independent                                           Pertinent Vitals/Pain Pain Assessment: No/denies pain    Home Living Family/patient expects to be discharged to:: Private residence Living Arrangements: Spouse/significant other Available Help at Discharge: Family;Available 24 hours/day   Home Access: Stairs to enter Entrance Stairs-Rails: None Entrance Stairs-Number of Steps: 1 Home Layout: One level Home Equipment: None      Prior Function Level of Independence: Independent         Comments: Pt does not need AD, drives, able to do all she needs w/o issue     Hand Dominance        Extremity/Trunk Assessment   Upper Extremity Assessment Upper Extremity Assessment: Generalized weakness(b/l finger opposition decreased L>R)    Lower Extremity Assessment Lower Extremity Assessment: Generalized weakness(coordination/quality of motion decreased b/l)       Communication   Communication: No difficulties  Cognition Arousal/Alertness: Awake/alert Behavior During Therapy: WFL for tasks assessed/performed Overall Cognitive Status: Within Functional Limits  for tasks assessed                                        General Comments      Exercises     Assessment/Plan    PT Assessment Patient needs continued PT services  PT Problem List Decreased coordination;Decreased safety awareness;Decreased strength;Decreased activity tolerance       PT Treatment Interventions Gait training;Stair training;Functional mobility training;Therapeutic activities;Balance training;Therapeutic exercise;Patient/family education    PT  Goals (Current goals can be found in the Care Plan section)  Acute Rehab PT Goals Patient Stated Goal: go home and get back to her normal activity PT Goal Formulation: With patient/family Time For Goal Achievement: 12/19/18 Potential to Achieve Goals: Good    Frequency 7X/week   Barriers to discharge        Co-evaluation               AM-PAC PT "6 Clicks" Mobility  Outcome Measure Help needed turning from your back to your side while in a flat bed without using bedrails?: None Help needed moving from lying on your back to sitting on the side of a flat bed without using bedrails?: None Help needed moving to and from a bed to a chair (including a wheelchair)?: None Help needed standing up from a chair using your arms (e.g., wheelchair or bedside chair)?: None Help needed to walk in hospital room?: A Little Help needed climbing 3-5 steps with a railing? : A Little 6 Click Score: 22    End of Session Equipment Utilized During Treatment: Gait belt Activity Tolerance: Patient tolerated treatment well Patient left: with chair alarm set;with call bell/phone within reach;with family/visitor present Nurse Communication: Mobility status PT Visit Diagnosis: Other symptoms and signs involving the nervous system (R29.898);Muscle weakness (generalized) (M62.81)    Time: [id number] PT Time Calculation (min) (ACUTE ONLY): 26 min   Charges:   PT Evaluation $PT Eval Low Complexity: 1 Low PT Treatments $Therapeutic Activity: 8-22 mins        Kreg Shropshire, DPT 12/05/2018, 1:56 PM

## 2018-12-05 NOTE — Evaluation (Signed)
Occupational Therapy Evaluation Patient Details Name: Norma Hill MRN: [id number] DOB: 01/19/1946 Today's Date: 12/05/2018    History of Present Illness Pt. is a 73 y.o. female  with a past medical history of atrial fibrillation on elequis, hypertension, presents to the emergency department for altered mental status/confusion. 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   Clinical Impression   Pt. Is a 73 y.o. female who was admitted to [facility] with altered mental status, and a CVA. Pt. resides at home with her husband. Pt. reports being independent with ADLs, and IADL functioning including meal preparation, medication management. Pt. reports that she was able to drive, and is a retired Patent examiner.  Pt. presents with weakness, and impaired motor control/ [facility] skills reporting that her hands don't always do what she wants them to. These impairments limit her ability her to perform daily tasks efficiently. Pt. Could benefit from follow-up outpatient OT services to further assess, and address coordination, hand function, ADL, and IADLs. All OT needs can be addressed at the next venue of care at this time.    Follow Up Recommendations  Outpatient OT    Equipment Recommendations       Recommendations for Other Services Rehab consult     Precautions / Restrictions Precautions Precautions: Fall Restrictions Weight Bearing Restrictions: No      Mobility Bed Mobility Overal bed mobility: Independent               Transfers Overall transfer level: Modified independent Equipment used: None                 Balance Overall balance assessment: Modified Independent                                         ADL either performed or assessed with clinical judgement   ADL Overall ADL's : Needs assistance/impaired Eating/Feeding: Set up   Grooming: Set up   Upper Body Bathing: Set up   Lower Body Bathing: Set  up;Supervison/ safety   Upper Body Dressing : Set up   Lower Body Dressing: Set up;Min guard                       Vision Patient Visual Report: No change from baseline       Perception     Praxis      Pertinent Vitals/Pain Pain Assessment: No/denies pain     Hand Dominance Right   Extremity/Trunk Assessment Upper Extremity Assessment Upper Extremity Assessment: Generalized weakness(Decreased [facility] skills, thumb opposition)          Communication Communication Communication: No difficulties   Cognition Arousal/Alertness: Awake/alert Behavior During Therapy: WFL for tasks assessed/performed Overall Cognitive Status: Within Functional Limits for tasks assessed                                     General Comments       Exercises     Shoulder Instructions      Home Living Family/patient expects to be discharged to:: Private residence Living Arrangements: Spouse/significant other Available Help at Discharge: Family;Available 24 hours/day Type of Home: House Home Access: Stairs to enter [employer] of Steps: 1 Entrance Stairs-Rails: None Home Layout: One level  Home Equipment: None          Prior Functioning/Environment Level of Independence: Independent        Comments: Pt.. reports being independent with ADLs, and IADLs, driving.        OT Problem List: Decreased coordination      OT Treatment/Interventions:      OT Goals(Current goals can be found in the care plan section) Acute Rehab OT Goals Patient Stated Goal: To return home OT Goal Formulation: With patient Potential to Achieve Goals: Good  OT Frequency:     Barriers to D/C:            Co-evaluation              AM-PAC OT "6 Clicks" Daily Activity     Outcome Measure Help from another person eating meals?: None Help from another person taking care of personal grooming?: None Help from another person toileting, which  includes using toliet, bedpan, or urinal?: A Little Help from another person bathing (including washing, rinsing, drying)?: A Little Help from another person to put on and taking off regular upper body clothing?: None Help from another person to put on and taking off regular lower body clothing?: A Little 6 Click Score: 21   End of Session    Activity Tolerance: Patient tolerated treatment well Patient left: in bed;with call bell/phone within reach;with bed alarm set  OT Visit Diagnosis: Muscle weakness (generalized) (M62.81)                Time: [id number] OT Time Calculation (min): 23 min Charges:  OT General Charges $OT Visit: 1 Visit OT Evaluation $OT Eval Low Complexity: 1 Low  Harrel Carina, MS, OTR/L  Harrel Carina 12/05/2018, 4:25 PM

## 2018-12-05 NOTE — Progress Notes (Addendum)
Dr. Jannifer Franklin notified of Potassium 2.7 New orders placed.

## 2018-12-05 NOTE — Consult Note (Signed)
[place] for Electrolyte Monitoring and Replacement   Recent Labs: Potassium (mmol/L)  Date Value  12/05/2018 3.0 (L)   Magnesium (mg/dL)  Date Value  12/05/2018 1.8   Calcium (mg/dL)  Date Value  12/04/2018 9.6   Albumin (g/dL)  Date Value  12/04/2018 4.4   Phosphorus (mg/dL)  Date Value  02/10/2016 3.1   Sodium (mmol/L)  Date Value  12/04/2018 141  04/18/2016 144   Assessment: Pharmacy was consulted for magnesium and potassium replacement. No magnesium ordered at this time. Scr is WNL.   K 3.0 @ 0544 was replaced with 16meq x 4 runs total. The plan was to recheck K+ 1 hour after last run administered. Mg 1.8 @ 0544  Goal of Therapy:  Electrolytes WNL   Plan: 1. Will order Magnesium Sulfate IV 2g x1 dose. Will recheck Magnesium with AM labs tomorrow.  2. Will follow up on K+ level @ ~ 1230 today.    Rowland Lathe ,PharmD Clinical Pharmacist 12/05/2018 7:20 AM

## 2018-12-05 NOTE — Consult Note (Signed)
[place] for Electrolyte Monitoring and Replacement   Recent Labs: Potassium (mmol/L)  Date Value  12/05/2018 3.6   Magnesium (mg/dL)  Date Value  12/05/2018 1.8   Calcium (mg/dL)  Date Value  12/04/2018 9.6   Albumin (g/dL)  Date Value  12/04/2018 4.4   Phosphorus (mg/dL)  Date Value  02/10/2016 3.1   Sodium (mmol/L)  Date Value  12/04/2018 141  04/18/2016 144   Assessment: Pharmacy was consulted for magnesium and potassium replacement. No magnesium ordered at this time. Scr is WNL.   K 3.6 @ 1237  Mg 1.8 @ 0544  Goal of Therapy:  Electrolytes WNL   Plan: 1. Will order Magnesium Sulfate IV 2g x1 dose. Will recheck Magnesium with AM labs tomorrow.  2. No replacement needed at this time. Will follow up with AM labs.    Rowland Lathe ,PharmD Clinical Pharmacist 12/05/2018 1:14 PM

## 2018-12-05 NOTE — Progress Notes (Signed)
Ch spoke w/ pt via telephone regarding AD education. Ch allowed space for the pt to share her current health issues and her goals. Pt husband was at bedside. Ch completed GOC with pt. Ch will f/u with pt to provide AD packet.    12/05/18 1100  Clinical Encounter Type  Visited With Patient and family together  Visit Type Other (Comment) (AD education )  Referral From Physician  Consult/Referral To Chaplain  Recommendations f/u with AD packet   Stress Factors  Patient Stress Factors Health changes;Major life changes  Family Stress Factors None identified  Advance Directives (For Healthcare)  Does Patient Have a Medical Advance Directive? No  Would patient like information on creating a medical advance directive? Yes (Inpatient - patient defers creating a medical advance directive at this time - Information given)

## 2018-12-05 NOTE — TOC Initial Note (Signed)
Transition of Care [facility]) - Initial/Assessment Note    Patient Details  Name: Norma Hill MRN: [id number] Date of Birth: 10/22/1945  [facility]) CM/SW Contact:    Candie Chroman, LCSW Phone Number: 12/05/2018, 2:22 PM  Clinical Narrative: CSW met with patient. Husband at bedside. CSW introduced role and explained that PT recommendations would be discussed. Patient and her husband agreeable to outpatient PT. Patient has worked with [facility] in the past but will review list to see if he has any other preferences. Patient was not using DME prior to admission and there are no new recommendations. No further concerns. CSW encouraged patient and her husband to contact CSW as needed. CSW will continue to follow patient and her husband for support and facilitate return home when stable.                Expected Discharge Plan: OP Rehab Barriers to Discharge: Continued Medical Work up   Patient Goals and CMS Choice        Expected Discharge Plan and Services Expected Discharge Plan: OP Rehab     Post Acute Care Choice: (Outpatient PT) Living arrangements for the past 2 months: Single Family Home Expected Discharge Date: 12/06/18               DME Arranged: N/A         HH Arranged: NA          Prior Living Arrangements/Services Living arrangements for the past 2 months: Single Family Home Lives with:: Spouse Patient language and need for interpreter reviewed:: Yes Do you feel safe going back to the place where you live?: Yes      Need for Family Participation in Patient Care: Yes (Comment) Care giver support system in place?: Yes (comment)   Criminal Activity/Legal Involvement Pertinent to Current Situation/Hospitalization: No - Comment as needed  Activities of Daily Living Home Assistive Devices/Equipment: None ADL Screening (condition at time of admission) Patient's cognitive ability adequate to safely complete daily activities?: No Is the patient  deaf or have difficulty hearing?: No Does the patient have difficulty seeing, even when wearing glasses/contacts?: No Does the patient have difficulty concentrating, remembering, or making decisions?: No Patient able to express need for assistance with ADLs?: Yes Does the patient have difficulty dressing or bathing?: No Independently performs ADLs?: Yes (appropriate for developmental age) Does the patient have difficulty walking or climbing stairs?: Yes Weakness of Legs: Both Weakness of Arms/Hands: None  Permission Sought/Granted Permission sought to share information with : Family Supports Permission granted to share information with : Yes, Verbal Permission Granted  Share Information with NAME: Ciarra Braddy     Permission granted to share info w Relationship: Husband  Permission granted to share info w Contact Information: [phone]  Emotional Assessment Appearance:: Appears stated age Attitude/Demeanor/Rapport: Engaged, Gracious Affect (typically observed): Accepting, Appropriate, Calm, Pleasant Orientation: : Oriented to Self, Oriented to Place, Oriented to  Time, Oriented to Situation Alcohol / Substance Use: Never Used Psych Involvement: No (comment)  Admission diagnosis:  Confusion [R41.0] Cerebrovascular accident (CVA), unspecified mechanism ([place]) [I63.9] Patient Active Problem List   Diagnosis Date Noted  . CVA (cerebral vascular accident) ([place]) 12/04/2018  . Atrial fibrillation with RVR ([place]) 09/29/2018  . Pleural effusion 05/13/2017  . SOB (shortness of breath) 05/12/2017  . New onset atrial fibrillation ([place]) 02/09/2016  . Personal history of malignant neoplasm of breast   . Cancer ([place]) 10/04/2000   PCP:  Guadalupe Maple, MD  Pharmacy:   [place], [place] - [address] [place] [address] Phone: [phone] Fax: [phone]     Social Determinants of Health (SDOH) Interventions    Readmission Risk  Interventions No flowsheet data found.

## 2018-12-05 NOTE — Progress Notes (Signed)
   12/05/18 1200  Clinical Encounter Type  Visited With Patient and family together  Visit Type Follow-up  Referral From Chaplain  Consult/Referral To Chaplain  Stress Factors  Patient Stress Factors Health changes  Family Stress Factors Health changes  Chaplain received OR for AD. Chaplain visit the patient and husband was at bedside. Chaplain introduce herself and ask patient was she interesting on inforamtion for AD. 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 Husband shared how he has saved the patient life twice by administrating CPR, and they felt it was time to put some things in place. Chaplain offered an active listening ear and ask was there anything else they needed and they both said no.

## 2018-12-05 NOTE — Consult Note (Signed)
[place] for Electrolyte Monitoring and Replacement   Recent Labs: Potassium (mmol/L)  Date Value  12/05/2018 3.0 (L)   Magnesium (mg/dL)  Date Value  12/05/2018 1.8   Calcium (mg/dL)  Date Value  12/04/2018 9.6   Albumin (g/dL)  Date Value  12/04/2018 4.4   Phosphorus (mg/dL)  Date Value  02/10/2016 3.1   Sodium (mmol/L)  Date Value  12/04/2018 141  04/18/2016 144   Assessment: Pharmacy was consulted for magnesium and potassium replacement. No magnesium ordered at this time. Scr is WNL.   Goal of Therapy:  Electrolytes WNL  1.Will order KCL 10 mEq x4 runs and KCL PO 40 mEq x1 dose. Will recheck potassium @ 2000.  Addendum @ [id number]: 3.1 (@ [id number] ). Level was supposed to be rechecked at 2000, but was unintentionally checked early. Lab was called to credited level to account. KCL IV & PO were not given in the ER, per  floor nurse on 1C. Called ED nurse to verify if KCL was given in the ED and she said that it was not. Will cancel KCL IV and adjust KCL PO timing and recheck level.   2. Will order magnesium. Addendum @ [id number]: Mg 2.0. No replacements indicated at this time. Will follow Mg with AM labs   Plan: K 2.7 @ 2152 - Dr Jannifer Franklin ordered KCL 17meq x 4 runs total K 3.0 @ 0544 - Will order KCL 9meq x 4 runs total Will recheck serum K+ 1 hour after last run administered, will recheck Mg in am  Ena Dawley ,PharmD Clinical Pharmacist 12/05/2018 6:37 AM

## 2018-12-06 LAB — CBC
HCT: 34.4 % — ABNORMAL LOW (ref 36.0–46.0)
Hemoglobin: 11 g/dL — ABNORMAL LOW (ref 12.0–15.0)
MCH: 27.7 pg (ref 26.0–34.0)
MCHC: 32 g/dL (ref 30.0–36.0)
MCV: 86.6 fL (ref 80.0–100.0)
Platelets: 239 10*3/uL (ref 150–400)
RBC: 3.97 MIL/uL (ref 3.87–5.11)
RDW: 15.6 % — ABNORMAL HIGH (ref 11.5–15.5)
WBC: 6.1 10*3/uL (ref 4.0–10.5)
nRBC: 0 % (ref 0.0–0.2)

## 2018-12-06 LAB — URINALYSIS, ROUTINE W REFLEX MICROSCOPIC
Bacteria, UA: NONE SEEN
Bilirubin Urine: NEGATIVE
Glucose, UA: NEGATIVE mg/dL
Hgb urine dipstick: NEGATIVE
Ketones, ur: NEGATIVE mg/dL
Nitrite: NEGATIVE
Protein, ur: NEGATIVE mg/dL
Specific Gravity, Urine: 1.004 — ABNORMAL LOW (ref 1.005–1.030)
pH: 7 (ref 5.0–8.0)

## 2018-12-06 LAB — BASIC METABOLIC PANEL
Anion gap: 9 (ref 5–15)
BUN: 9 mg/dL (ref 8–23)
CO2: 24 mmol/L (ref 22–32)
Calcium: 9 mg/dL (ref 8.9–10.3)
Chloride: 106 mmol/L (ref 98–111)
Creatinine, Ser: 0.64 mg/dL (ref 0.44–1.00)
GFR calc Af Amer: >60 mL/min (ref >60)
GFR calc non Af Amer: >60 mL/min (ref >60)
Glucose, Bld: 97 mg/dL (ref 70–99)
Potassium: 3 mmol/L — ABNORMAL LOW (ref 3.5–5.1)
Sodium: 139 mmol/L (ref 135–145)

## 2018-12-06 LAB — MAGNESIUM: Magnesium: 2 mg/dL (ref 1.7–2.4)

## 2018-12-06 LAB — POTASSIUM: Potassium: 3.2 mmol/L — ABNORMAL LOW (ref 3.5–5.1)

## 2018-12-06 MED ORDER — POTASSIUM CHLORIDE 10 MEQ/100ML IV SOLN: 10.0000 meq | INTRAVENOUS | Status: DC

## 2018-12-06 MED ORDER — POTASSIUM CHLORIDE CRYS ER 20 MEQ PO TBCR: 20.0000 meq | EXTENDED_RELEASE_TABLET | Freq: Once | ORAL | Status: DC

## 2018-12-06 MED ORDER — POTASSIUM CHLORIDE CRYS ER 20 MEQ PO TBCR
40.0000 meq | EXTENDED_RELEASE_TABLET | Freq: Once | ORAL | Status: AC
  Administered 2018-12-06: 11:00:00 40 meq via ORAL
  Filled 2018-12-06: qty 2

## 2018-12-06 MED ORDER — SODIUM CHLORIDE 0.9 % IV SOLN
1.0000 g | INTRAVENOUS | Status: DC
  Administered 2018-12-06 – 2018-12-08 (×3): 1 g via INTRAVENOUS
  Filled 2018-12-06 (×2): qty 1
  Filled 2018-12-06: qty 10
  Filled 2018-12-06: qty 1
  Filled 2018-12-06: qty 10

## 2018-12-06 MED ORDER — POTASSIUM CHLORIDE 20 MEQ PO PACK
40.0000 meq | PACK | Freq: Once | ORAL | Status: AC
  Administered 2018-12-06: 40 meq via ORAL
  Filled 2018-12-06: qty 2

## 2018-12-06 MED ORDER — POTASSIUM CHLORIDE 20 MEQ PO PACK
20.0000 meq | PACK | Freq: Once | ORAL | Status: AC
  Administered 2018-12-06: 20 meq via ORAL
  Filled 2018-12-06: qty 1

## 2018-12-06 NOTE — Progress Notes (Signed)
Physical Therapy Treatment Patient Details Name: Norma Hill MRN: [id number] DOB: 1945/08/12 Today's Date: 12/06/2018    History of Present Illness 73 y.o. female  with a past medical history of atrial fibrillation on elequis, hypertension, presents to the emergency department for altered mental status/confusion. 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    PT Comments    Pt did well with PT today, started antibiotics for UTI and expects to go home tomorrow and start outpatient PT.  She was able to easily and confidently circumambulate the nurses' station X 2 with no LOBs, change in cadence or fatigue (O2 remained in high 90s).  She showed great effort with balance tasks and though she had some issues with more challenging balance acts but overall did well.     Follow Up Recommendations  Outpatient PT     Equipment Recommendations  None recommended by PT    Recommendations for Other Services       Precautions / Restrictions Precautions Precautions: Fall Restrictions Weight Bearing Restrictions: No    Mobility  Bed Mobility Overal bed mobility: Independent             General bed mobility comments: Pt was able to attain sitting at EOB w/o assist or hesitation  Transfers Overall transfer level: Independent Equipment used: None             General transfer comment: Pt able to rise to standing multiple times t/o session with no issues  Ambulation/Gait   Gait Distance (Feet): 500 Feet Assistive device: None       General Gait Details: Pt with more confident and consistent cadence today, O2 remained in the high 90s.  She is still not at her baseline, but near independent and community appropriate this date.   Stairs             Wheelchair Mobility    Modified Rankin (Stroke Patients Only)       Balance Overall balance assessment: Modified Independent                                           Cognition Arousal/Alertness: Awake/alert Behavior During Therapy: WFL for tasks assessed/performed Overall Cognitive Status: Within Functional Limits for tasks assessed                                        Exercises Other Exercises Other Exercises: balance exercises including: light HHA heel raises (X10), SLS (5 x 5 sec holds b/l), NBOS EC static standing and with perturbations, NBOS static standing reaching to variable targets outside BOS (X 3 minutes), multiple bouts of heel toe and retro ambulation with faded HHA    General Comments        Pertinent Vitals/Pain Pain Assessment: No/denies pain    Home Living                      Prior Function            PT Goals (current goals can now be found in the care plan section) Progress towards PT goals: Progressing toward goals    Frequency    Min 2X/week      PT Plan Current plan remains appropriate    Co-evaluation  AM-PAC PT "6 Clicks" Mobility   Outcome Measure  Help needed turning from your back to your side while in a flat bed without using bedrails?: None Help needed moving from lying on your back to sitting on the side of a flat bed without using bedrails?: None Help needed moving to and from a bed to a chair (including a wheelchair)?: None Help needed standing up from a chair using your arms (e.g., wheelchair or bedside chair)?: None Help needed to walk in hospital room?: None Help needed climbing 3-5 steps with a railing? : None 6 Click Score: 24    End of Session Equipment Utilized During Treatment: Gait belt Activity Tolerance: Patient tolerated treatment well Patient left: with chair alarm set;with call bell/phone within reach;with family/visitor present Nurse Communication: Mobility status PT Visit Diagnosis: Other symptoms and signs involving the nervous system (R29.898);Muscle weakness (generalized) (M62.81)     Time: [id number] PT Time Calculation  (min) (ACUTE ONLY): 28 min  Charges:  $Gait Training: 8-22 mins $Neuromuscular Re-education: 8-22 mins                     Kreg Shropshire, DPT 12/06/2018, 3:18 PM

## 2018-12-06 NOTE — TOC Transition Note (Signed)
Transition of Care [facility]) - CM/SW Discharge Note   Patient Details  Name: Arminta Gamm MRN: [id number] Date of Birth: March 15, 1945  Transition of Care [facility]) CM/SW Contact:  Naeemah Jasmer, Lenice Llamas Phone Number: [phone]  12/06/2018, 11:51 AM   Clinical Narrative: Per RN in progression rounds patient is alert to self and place only. Clinical Education officer, museum (CSW) met with patient a second time today and her husband Evette Diclemente was at bedside. CSW asked husband if they have decided on an outpatient therapy clinic. Per husband he wants to use [facility] outpatient therapy here at [facility]. CSW explained to husband that CSW will send outpatient therapy referral to [facility]. CSW explained that [facility] outpatient therapy will contact patient's PCP Dr. Freddi Starr in [place] for orders and then will reach out to patient and husband to schedule an appointment. Husband verbalized his understanding and thanked CSW for assistance.   CSW faxed referral to [facility] outpatient therapy. CSW also emailed referral to [facility] at [facility] outpatient therapy. MD aware of above. Please reconsult if future social work needs arise. CSW signing off.      Final next level of care: OP Rehab Barriers to Discharge: Barriers Resolved   Patient Goals and CMS Choice Patient states their goals for this hospitalization and ongoing recovery are:: To go home.   Choice offered to / list presented to : (Outpatient therapy)  Discharge Placement                       Discharge Plan and Services     Post Acute Care Choice: (Outpatient PT)          DME Arranged: N/A         HH Arranged: NA          Social Determinants of Health (SDOH) Interventions     Readmission Risk Interventions No flowsheet data found.

## 2018-12-06 NOTE — Progress Notes (Signed)
   12/06/18 1500  Clinical Encounter Type  Visited With Patient  Visit Type Follow-up  Referral From Nurse  Ch received an OR for AD creation. Pt said she has not discussed it with any of the care team yet but refused to go over the document with this Probation officer today. Ch will have another ch check in with her tomorrow to see if the pt has any needs.

## 2018-12-06 NOTE — TOC Progression Note (Signed)
Transition of Care [facility]) - Progression Note    Patient Details  Name: Norma Hill MRN: [id number] Date of Birth: 20-Apr-1945  Transition of Care [facility]) CM/SW Contact  Jaretssi Kraker, Lenice Llamas Phone Number: [phone]  12/06/2018, 10:07 AM  Clinical Narrative: Clinical Social Worker (CSW) met with patient to follow up and see if she chose an outpatient PT clinic to go to. Patient reported that she has not chosen and will continue to review the list. CSW explained that patient will need to call her PCP ask them to send orders/ referral to the outpatient PT clinic that she wants to go to. Patient verbalized her understanding.     Expected Discharge Plan: OP Rehab Barriers to Discharge: Continued Medical Work up  Expected Discharge Plan and Services Expected Discharge Plan: OP Rehab     Post Acute Care Choice: (Outpatient PT) Living arrangements for the past 2 months: Single Family Home Expected Discharge Date: 12/06/18               DME Arranged: N/A         HH Arranged: NA           Social Determinants of Health (SDOH) Interventions    Readmission Risk Interventions No flowsheet data found.

## 2018-12-06 NOTE — Progress Notes (Signed)
[place] at [place] NAME: Norma Hill    MR#:  [id number]  DATE OF BIRTH:  Apr 17, 1945  SUBJECTIVE:  CHIEF COMPLAINT:   Chief Complaint  Patient presents with  . Altered Mental Status  . Tachycardia   Patient reported to be having increased frequent urinations today.  Urinalysis requested.  Reported confusion appears to be improved.  REVIEW OF SYSTEMS:  Review of Systems  Constitutional: Negative for chills and fever.  HENT: Negative for hearing loss and tinnitus.   Eyes: Negative for blurred vision and double vision.  Respiratory: Negative for cough and hemoptysis.   Cardiovascular: Negative for chest pain and palpitations.  Gastrointestinal: Negative for heartburn, nausea and vomiting.  Genitourinary: Negative for dysuria.  Skin: Negative for itching and rash.  Neurological: Negative for dizziness and focal weakness.       Reported confusion appears to be improving.  Psychiatric/Behavioral: Negative for depression and hallucinations.    DRUG ALLERGIES:  No Known Allergies VITALS:  Blood pressure 140/82, pulse 87, temperature 98.1 F (36.7 C), temperature source Oral, resp. rate 16, height 5\' 5"  (1.651 m), weight 66.7 kg, SpO2 91 %. PHYSICAL EXAMINATION:  Physical Exam   Constitutional:      General: She is not in acute distress. HENT:     Head: Normocephalic.  Eyes:     General: No scleral icterus. Neck:     Musculoskeletal: Normal range of motion and neck supple.     Vascular: No JVD.     Trachea: No tracheal deviation.  Cardiovascular:     Rate and Rhythm: Normal rate and regular rhythm.     Heart sounds: Normal heart sounds. No murmur. No friction rub. No gallop.   Pulmonary:     Effort: Pulmonary effort is normal. No respiratory distress.     Breath sounds: Normal breath sounds. No wheezing or rales.  Chest:     Chest wall: No tenderness.  Abdominal:     General: Bowel sounds are normal. There is no  distension.     Palpations: Abdomen is soft. There is no mass.     Tenderness: There is no abdominal tenderness. There is no guarding or rebound.  Musculoskeletal: Normal range of motion.  Skin:    General: Skin is warm.     Findings: No erythema or rash.  Neurological:     Mental Status: She is alert and oriented to person, place but disoriented to time. Psychiatric:        Judgment: Judgment normal.   LABORATORY PANEL:  Female CBC Recent Labs  Lab 12/06/18 0434  WBC 6.1  HGB 11.0*  HCT 34.4*  PLT 239   ------------------------------------------------------------------------------------------------------------------ Chemistries  Recent Labs  Lab 12/04/18 1128  12/06/18 0434  NA 141  --  139  K 2.5*   < > 3.0*  CL 102  --  106  CO2 27  --  24  GLUCOSE 117*  --  97  BUN 9  --  9  CREATININE 0.83  --  0.64  CALCIUM 9.6  --  9.0  MG  --    < > 2.0  AST 24  --   --   ALT 13  --   --   ALKPHOS 79  --   --   BILITOT 1.2  --   --    < > = values in this interval not displayed.   RADIOLOGY:  No results found. ASSESSMENT AND PLAN:  73 y/o female with PAF and chronic diastolic heart failure pEF by ECHO 09/2018 who presents to the emergency room due to 2 weeks of increased confusion.  1. 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 He is unsure if she is taking her medications appropriately. 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  Carotid Doppler ultrasound with no hemodynamically significant stenosis. -MRI with PCA stroke and significant intracranial stenosis more so on R side.   -Appreciate neurology input.  Recommendation is to continue anticoagulation with Eliquis.  Low-dose  aspirin added per neurology recommendation. Patient seen by physical therapist and occupational therapist.  Outpatient PT and OT recommended on discharge.  Case manager already assisting with setting up. Glycosylated hemoglobin level of 5.2  2. PAF: OK to continue Diltiazem since symptoms have been going on for longer then a few days. Continue Eliquis Tele monitor  3. Hypokalemia: Repleted  Magnesium level okay  4. Chronic Diastolic heart failure:  No signs of exacerbation  5.  Mild UTI Symptomatic.  Patient having increased frequency of urination this morning.  Urinalysis done. Started on IV Rocephin.  Anticipate transitioning to p.o. antibiotics with possible discharge tomorrow.  DVT prophylaxis; Eliquis  All the records are reviewed and case discussed with Care Management/Social Worker. Management plans discussed with the patient, family and they are in agreement.  Updated husband present at bedside on treatment plans.   CODE STATUS: Full Code  TOTAL TIME TAKING CARE OF THIS PATIENT: 37 minutes.   More than 50% of the time was spent in counseling/coordination of care: YES  POSSIBLE D/C IN 2 DAYS, DEPENDING ON CLINICAL CONDITION.   Leam Madero M.D on 12/06/2018 at 2:18 PM  Between 7am to 6pm - Pager - [phone]  After 6pm go to [email or web address] - Proofreader  [facility]  Office  [phone]  CC: Primary care physician; Guadalupe Maple, MD  Note: This dictation was prepared with Dragon dictation along with smaller phrase technology. Any transcriptional errors that result from this process are unintentional.

## 2018-12-06 NOTE — Progress Notes (Signed)
SLP Cancellation Note  Patient Details Name: Sarrinah Gardin MRN: [id number] DOB: 09/17/1945   Cancelled treatment:       Reason Eval/Treat Not Completed: SLP screened, no needs identified, will sign off(chart reviewed; consulted NSG/CM then met w/ pt/husband). Pt denied any difficulty swallowing and is currently on a regular diet; tolerates swallowing pills w/ water per NSG. Pt conversed in conversation w/out overt deficits noted w/ both pt and husband; pt and husband denied any current speech-language deficits and stated "her confusion is much better now". Pt stated she felt she is at her baseline. Noted MRI, Neurology notes. Discussed w/ pt and Husband the option of following up w/ skilled ST services for a formal Cognitive-linguistic assessment if they feel any concern or questions re: pt's presentation at home post discharge. Pt stated she did not feel she would need but agreed; Husband agreed to f/u w/ pt's PCP if any needs.  No further skilled ST services during inpatient stay as pt states she is, and grossly appears, at her baseline. NSG to reconsult if any change in status.      Orinda Kenner, MS, CCC-SLP Arneshia Ade 12/06/2018, 11:30 AM

## 2018-12-06 NOTE — Consult Note (Addendum)
[place] for Electrolyte Monitoring and Replacement   Recent Labs: Potassium (mmol/L)  Date Value  12/06/2018 3.0 (L)   Magnesium (mg/dL)  Date Value  12/06/2018 2.0   Calcium (mg/dL)  Date Value  12/06/2018 9.0   Albumin (g/dL)  Date Value  12/04/2018 4.4   Phosphorus (mg/dL)  Date Value  02/10/2016 3.1   Sodium (mmol/L)  Date Value  12/06/2018 139  04/18/2016 144   Assessment: Pharmacy was consulted for magnesium and potassium replacement. Scr is WNL.   K 3.0  @ 0434. Continues to be hypokalemic and may need to check phosphorus levels.   Mg 2.0 @ 0434  Goal of Therapy:  Electrolytes WNL   Plan: 1. No replacement at this time. Will recheck Magnesium with AM labs tomorrow.   2. Will order KCL IV 10 mEq x 5 doses. Will recheck potassium at 1800. Will follow up with MD on getting phosphorus level since it is not part of the pharmacy.   Addendum @ 0827: MD order KCL PO 40 mEq x 1 dose since review. Will order KCL 20 mEq x 1 dose and recheck K at 1800.    Rowland Lathe ,PharmD Clinical Pharmacist 12/06/2018 8:15 AM

## 2018-12-06 NOTE — Care Management Important Message (Signed)
Important Message  Patient Details  Name: Norma Hill MRN: [id number] Date of Birth: 11/28/1945   Medicare Important Message Given:  Yes     Juliann Pulse A Hatim Homann 12/06/2018, 10:48 AM

## 2018-12-06 NOTE — Consult Note (Signed)
[place] for Electrolyte Monitoring and Replacement   Recent Labs: Potassium (mmol/L)  Date Value  12/06/2018 3.2 (L)   Magnesium (mg/dL)  Date Value  12/06/2018 2.0   Calcium (mg/dL)  Date Value  12/06/2018 9.0   Albumin (g/dL)  Date Value  12/04/2018 4.4   Phosphorus (mg/dL)  Date Value  02/10/2016 3.1   Sodium (mmol/L)  Date Value  12/06/2018 139  04/18/2016 144   Assessment: Pharmacy was consulted for magnesium and potassium replacement. Scr is WNL.   K 3.0  @ 0434. Continues to be hypokalemic and may need to check phosphorus levels.   Mg 2.0 @ 0434  K 3.2 @ 1800  Goal of Therapy:  Electrolytes WNL   Plan: K remains low, will order additional KCl 47mEq PO times one. Follow up on AM labs   Paulina Fusi, PharmD, BCPS 12/06/2018 6:40 PM

## 2018-12-07 LAB — BASIC METABOLIC PANEL
Anion gap: 11 (ref 5–15)
BUN: 9 mg/dL (ref 8–23)
CO2: 23 mmol/L (ref 22–32)
Calcium: 9 mg/dL (ref 8.9–10.3)
Chloride: 107 mmol/L (ref 98–111)
Creatinine, Ser: 0.65 mg/dL (ref 0.44–1.00)
GFR calc Af Amer: >60 mL/min (ref >60)
GFR calc non Af Amer: >60 mL/min (ref >60)
Glucose, Bld: 100 mg/dL — ABNORMAL HIGH (ref 70–99)
Potassium: 3.5 mmol/L (ref 3.5–5.1)
Sodium: 141 mmol/L (ref 135–145)

## 2018-12-07 LAB — MAGNESIUM: Magnesium: 1.9 mg/dL (ref 1.7–2.4)

## 2018-12-07 MED ORDER — POTASSIUM CHLORIDE 20 MEQ PO PACK
40.0000 meq | PACK | Freq: Once | ORAL | Status: AC
  Administered 2018-12-07: 40 meq via ORAL
  Filled 2018-12-07: qty 2

## 2018-12-07 MED ORDER — QUETIAPINE FUMARATE 25 MG PO TABS
25.0000 mg | ORAL_TABLET | Freq: Every evening | ORAL | Status: DC | PRN
  Administered 2018-12-07: 25 mg via ORAL
  Filled 2018-12-07: qty 1

## 2018-12-07 MED ORDER — MAGNESIUM SULFATE IN D5W 1-5 GM/100ML-% IV SOLN
1.0000 g | Freq: Once | INTRAVENOUS | Status: AC
  Administered 2018-12-07: 1 g via INTRAVENOUS
  Filled 2018-12-07: qty 100

## 2018-12-07 MED ORDER — SODIUM CHLORIDE 0.9 % IV BOLUS
1000.0000 mL | Freq: Once | INTRAVENOUS | Status: AC
  Administered 2018-12-07: 05:00:00 1000 mL via INTRAVENOUS

## 2018-12-07 MED ORDER — POTASSIUM CHLORIDE CRYS ER 20 MEQ PO TBCR
40.0000 meq | EXTENDED_RELEASE_TABLET | Freq: Once | ORAL | Status: AC
  Administered 2018-12-07: 40 meq via ORAL
  Filled 2018-12-07: qty 2

## 2018-12-07 MED ORDER — TAMSULOSIN HCL 0.4 MG PO CAPS
0.4000 mg | ORAL_CAPSULE | Freq: Every day | ORAL | Status: DC
  Administered 2018-12-07 – 2018-12-09 (×3): 0.4 mg via ORAL
  Filled 2018-12-07 (×3): qty 1

## 2018-12-07 MED ORDER — POTASSIUM CHLORIDE 20 MEQ PO PACK: 20.0000 meq | PACK | Freq: Once | ORAL | Status: DC

## 2018-12-07 NOTE — Progress Notes (Signed)
Pharmacy Electrolyte Monitoring Consult:  Pharmacy consulted to assist in monitoring and replacing electrolytes in this 73 y.o. female admitted on 12/04/2018 with Altered Mental Status and Tachycardia. Patient with past medical history significant for atrial fibrillation, breast cancer, hypertension, and malignant neoplasms.    Labs:  Sodium (mmol/L)  Date Value  12/07/2018 141  04/18/2016 144   Potassium (mmol/L)  Date Value  12/07/2018 3.5   Magnesium (mg/dL)  Date Value  12/07/2018 1.9   Phosphorus (mg/dL)  Date Value  02/10/2016 3.1   Calcium (mg/dL)  Date Value  12/07/2018 9.0   Albumin (g/dL)  Date Value  12/04/2018 4.4    Assessment/Plan: Will order potassium 61mEq PO x 1 and magnesium 1g IV x 1.   In setting of atrial fibrillation will replace for goal potassium ~ 4 and goal magnesium ~ 2.   Will obtain BMP/Magnesium with am labs.   Pharmacy will continue to monitor and adjust per consult.   Damarko Stitely L 12/07/2018 12:15 PM

## 2018-12-07 NOTE — Plan of Care (Signed)
  Problem: Education: Goal: Knowledge of disease or condition will improve Outcome: Progressing   Problem: Education: Goal: Knowledge of disease or condition will improve Outcome: Progressing Goal: Knowledge of secondary prevention will improve Outcome: Progressing Goal: Knowledge of patient specific risk factors addressed and post discharge goals established will improve Outcome: Progressing Goal: Individualized Educational Video(s) Outcome: Progressing   Problem: Coping: Goal: Will verbalize positive feelings about self Outcome: Progressing Goal: Will identify appropriate support needs Outcome: Progressing   Problem: Health Behavior/Discharge Planning: Goal: Ability to manage health-related needs will improve Outcome: Progressing   Problem: Self-Care: Goal: Ability to participate in self-care as condition permits will improve Outcome: Progressing Goal: Verbalization of feelings and concerns over difficulty with self-care will improve Outcome: Progressing Goal: Ability to communicate needs accurately will improve Outcome: Progressing   Problem: Nutrition: Goal: Risk of aspiration will decrease Outcome: Progressing Goal: Dietary intake will improve Outcome: Progressing   Problem: Intracerebral Hemorrhage Tissue Perfusion: Goal: Complications of Intracerebral Hemorrhage will be minimized Outcome: Progressing   Problem: Ischemic Stroke/TIA Tissue Perfusion: Goal: Complications of ischemic stroke/TIA will be minimized Outcome: Progressing   Problem: Spontaneous Subarachnoid Hemorrhage Tissue Perfusion: Goal: Complications of Spontaneous Subarachnoid Hemorrhage will be minimized Outcome: Progressing   Problem: Education: Goal: Knowledge of General Education information will improve Description: Including pain rating scale, medication(s)/side effects and non-pharmacologic comfort measures Outcome: Progressing   Problem: Health Behavior/Discharge Planning: Goal:  Ability to manage health-related needs will improve Outcome: Progressing   Problem: Clinical Measurements: Goal: Ability to maintain clinical measurements within normal limits will improve Outcome: Progressing Goal: Will remain free from infection Outcome: Progressing Goal: Diagnostic test results will improve Outcome: Progressing Goal: Respiratory complications will improve Outcome: Progressing Goal: Cardiovascular complication will be avoided Outcome: Progressing   Problem: Activity: Goal: Risk for activity intolerance will decrease Outcome: Progressing   Problem: Nutrition: Goal: Adequate nutrition will be maintained Outcome: Progressing   Problem: Coping: Goal: Level of anxiety will decrease Outcome: Progressing   Problem: Elimination: Goal: Will not experience complications related to bowel motility Outcome: Progressing Goal: Will not experience complications related to urinary retention Outcome: Progressing   Problem: Pain Managment: Goal: General experience of comfort will improve Outcome: Progressing   Problem: Safety: Goal: Ability to remain free from injury will improve Outcome: Progressing   Problem: Skin Integrity: Goal: Risk for impaired skin integrity will decrease Outcome: Progressing

## 2018-12-07 NOTE — Progress Notes (Signed)
Rescanned bladder at appx 1645 which showed ~571mL urine in bladder. Insert foley per Dr. Posey Pronto.

## 2018-12-07 NOTE — Progress Notes (Signed)
Pt up to void every 15-30 minutes this am. Bladder scan at appx 1020 shows 557mL urine in bladder. I&O cath per Dr. Posey Pronto. 768mL output with I&O cath. Urine clear, yellow, with no foul odor. Will continue to monitor.

## 2018-12-07 NOTE — Progress Notes (Signed)
Patient up to BR every hour c/o urgency and unable to void. Patient is voiding at times. Received verbal order from Dr. Marcille Blanco for one time bolus to flush bladder.

## 2018-12-07 NOTE — Progress Notes (Signed)
[place] at [place] NAME: Norma Hill    MR#:  [id number]  DATE OF BIRTH:  08/27/1945  SUBJECTIVE:  CHIEF COMPLAINT:   Chief Complaint  Patient presents with  . Altered Mental Status  . Tachycardia   Patient continues to have increasing frequency of urination also having urinary retention today.  Confusion is improved husband at bedside.  REVIEW OF SYSTEMS:  Review of Systems  Constitutional: Negative for chills and fever.  HENT: Negative for hearing loss and tinnitus.   Eyes: Negative for blurred vision and double vision.  Respiratory: Negative for cough and hemoptysis.   Cardiovascular: Negative for chest pain and palpitations.  Gastrointestinal: Negative for heartburn, nausea and vomiting.  Genitourinary: Negative for dysuria.  Skin: Negative for itching and rash.  Neurological: Negative for dizziness and focal weakness.       Reported confusion appears to be improving.  Psychiatric/Behavioral: Negative for depression and hallucinations.    DRUG ALLERGIES:  No Known Allergies VITALS:  Blood pressure (!) 156/81, pulse 94, temperature 98.6 F (37 C), resp. rate 19, height 5\' 5"  (1.651 m), weight 66.7 kg, SpO2 92 %. PHYSICAL EXAMINATION:  Physical Exam   Constitutional:      General: She is not in acute distress. HENT:     Head: Normocephalic.  Eyes:     General: No scleral icterus. Neck:     Musculoskeletal: Normal range of motion and neck supple.     Vascular: No JVD.     Trachea: No tracheal deviation.  Cardiovascular:     Rate and Rhythm: Normal rate and regular rhythm.     Heart sounds: Normal heart sounds. No murmur. No friction rub. No gallop.   Pulmonary:     Effort: Pulmonary effort is normal. No respiratory distress.     Breath sounds: Normal breath sounds. No wheezing or rales.  Chest:     Chest wall: No tenderness.  Abdominal:     General: Bowel sounds are normal. There is no distension.   Palpations: Abdomen is soft. There is no mass.     Tenderness: There is no abdominal tenderness. There is no guarding or rebound.  Musculoskeletal: Normal range of motion.  Skin:    General: Skin is warm.     Findings: No erythema or rash.  Neurological:     Mental Status: She is alert and oriented to person, place but disoriented to time. Psychiatric:        Judgment: Judgment normal.   LABORATORY PANEL:  Female CBC Recent Labs  Lab 12/06/18 0434  WBC 6.1  HGB 11.0*  HCT 34.4*  PLT 239   ------------------------------------------------------------------------------------------------------------------ Chemistries  Recent Labs  Lab 12/04/18 1128  12/07/18 0658  NA 141   < > 141  K 2.5*   < > 3.5  CL 102   < > 107  CO2 27   < > 23  GLUCOSE 117*   < > 100*  BUN 9   < > 9  CREATININE 0.83   < > 0.65  CALCIUM 9.6   < > 9.0  MG  --    < > 1.9  AST 24  --   --   ALT 13  --   --   ALKPHOS 79  --   --   BILITOT 1.2  --   --    < > = values in this interval not displayed.   RADIOLOGY:  No results found. ASSESSMENT AND  PLAN:    73 y/o female with PAF and chronic diastolic heart failure pEF by ECHO 09/2018 who presents to the emergency room due to 2 weeks of increased confusion.  1. 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 He is unsure if she is taking her medications appropriately. MRA head with multifocal intracranial arterial stenoses - Confusion could be related to subacute stroke which is likely 51-64 weeks old given the findings on the [facility].  Carotid Doppler ultrasound with no hemodynamically significant stenosis. -MRI with PCA stroke and significant intracranial stenosis more so on R side.   -Appreciate neurology input.  Recommendation is to continue anticoagulation with Eliquis.  Low-dose aspirin  added per neurology recommendation. Patient seen by physical therapist and occupational therapist.  Outpatient PT and OT recommended on discharge.  Case manager already assisting with setting up. Glycosylated hemoglobin level of 5.2  2. PAF: OK to continue Diltiazem since symptoms have been going on for longer then a few days. Continue Eliquis Tele monitor  3. Hypokalemia: Repleted  Magnesium level okay  4. Chronic Diastolic heart failure:  No signs of exacerbation  5.  Urinary incontinence with urinary retention I will order bladder scan every 8 hours.  Start patient on Flomax.  DVT prophylaxis; Eliquis  All the records are reviewed and case discussed with Care Management/Social Worker. Management plans discussed with the patient, family and they are in agreement.  Updated husband present at bedside on treatment plans.   CODE STATUS: Full Code  TOTAL TIME TAKING CARE OF THIS PATIENT: 37 minutes.   More than 50% of the time was spent in counseling/coordination of care: YES  POSSIBLE D/C IN 2 DAYS, DEPENDING ON CLINICAL CONDITION.   Dustin Flock M.D on 12/07/2018 at 3:32 PM  Between 7am to 6pm - Pager - [phone]  After 6pm go to [email or web address] - Proofreader  [facility]  Office  [phone]  CC: Primary care physician; Guadalupe Maple, MD  Note: This dictation was prepared with Dragon dictation along with smaller phrase technology. Any transcriptional errors that result from this process are unintentional.

## 2018-12-08 ENCOUNTER — Inpatient Hospital Stay: Payer: Medicare Other

## 2018-12-08 LAB — BASIC METABOLIC PANEL
Anion gap: 7 (ref 5–15)
BUN: 8 mg/dL (ref 8–23)
CO2: 23 mmol/L (ref 22–32)
Calcium: 8.6 mg/dL — ABNORMAL LOW (ref 8.9–10.3)
Chloride: 111 mmol/L (ref 98–111)
Creatinine, Ser: 0.6 mg/dL (ref 0.44–1.00)
GFR calc Af Amer: >60 mL/min (ref >60)
GFR calc non Af Amer: >60 mL/min (ref >60)
Glucose, Bld: 90 mg/dL (ref 70–99)
Potassium: 3.3 mmol/L — ABNORMAL LOW (ref 3.5–5.1)
Sodium: 141 mmol/L (ref 135–145)

## 2018-12-08 LAB — MAGNESIUM: Magnesium: 2 mg/dL (ref 1.7–2.4)

## 2018-12-08 MED ORDER — CHLORHEXIDINE GLUCONATE CLOTH 2 % EX PADS
6.0000 | MEDICATED_PAD | Freq: Every day | CUTANEOUS | Status: DC
  Administered 2018-12-08 – 2018-12-09 (×2): 6 via TOPICAL

## 2018-12-08 MED ORDER — POTASSIUM CHLORIDE CRYS ER 20 MEQ PO TBCR
40.0000 meq | EXTENDED_RELEASE_TABLET | Freq: Once | ORAL | Status: AC
  Administered 2018-12-08: 12:00:00 40 meq via ORAL
  Filled 2018-12-08: qty 2

## 2018-12-08 MED ORDER — POTASSIUM CHLORIDE CRYS ER 20 MEQ PO TBCR
40.0000 meq | EXTENDED_RELEASE_TABLET | Freq: Once | ORAL | Status: AC
  Administered 2018-12-08: 40 meq via ORAL
  Filled 2018-12-08: qty 2

## 2018-12-08 NOTE — Progress Notes (Signed)
[place] at [place] NAME: Kameah Thresher    MR#:  [id number]  DATE OF BIRTH:  Oct 13, 1945  SUBJECTIVE:  CHIEF COMPLAINT:   Chief Complaint  Patient presents with  . Altered Mental Status  . Tachycardia   Husband at bedside is concerned about patient being weak.  REVIEW OF SYSTEMS:  Review of Systems  Constitutional: Negative for chills and fever.  HENT: Negative for hearing loss and tinnitus.   Eyes: Negative for blurred vision and double vision.  Respiratory: Negative for cough and hemoptysis.   Cardiovascular: Negative for chest pain and palpitations.  Gastrointestinal: Negative for heartburn, nausea and vomiting.  Genitourinary: Negative for dysuria.  Skin: Negative for itching and rash.  Neurological: Negative for dizziness and focal weakness.       Reported confusion appears to be improving.  Psychiatric/Behavioral: Negative for depression and hallucinations.    DRUG ALLERGIES:  No Known Allergies VITALS:  Blood pressure (!) 142/73, pulse 97, temperature 98.5 F (36.9 C), temperature source Oral, resp. rate 18, height 5\' 5"  (1.651 m), weight 66.7 kg, SpO2 92 %. PHYSICAL EXAMINATION:  Physical Exam   Constitutional:      General: She is not in acute distress. HENT:     Head: Normocephalic.  Eyes:     General: No scleral icterus. Neck:     Musculoskeletal: Normal range of motion and neck supple.     Vascular: No JVD.     Trachea: No tracheal deviation.  Cardiovascular:     Rate and Rhythm: Normal rate and regular rhythm.     Heart sounds: Normal heart sounds. No murmur. No friction rub. No gallop.   Pulmonary:     Effort: Pulmonary effort is normal. No respiratory distress.     Breath sounds: Normal breath sounds. No wheezing or rales.  Chest:     Chest wall: No tenderness.  Abdominal:     General: Bowel sounds are normal. There is no distension.     Palpations: Abdomen is soft. There is no mass.     Tenderness:  There is no abdominal tenderness. There is no guarding or rebound.  Musculoskeletal: Normal range of motion.  Skin:    General: Skin is warm.     Findings: No erythema or rash.  Neurological:     Mental Status: She is alert and oriented to person, place but disoriented to time. Psychiatric:        Judgment: Judgment normal.   LABORATORY PANEL:  Female CBC Recent Labs  Lab 12/06/18 0434  WBC 6.1  HGB 11.0*  HCT 34.4*  PLT 239   ------------------------------------------------------------------------------------------------------------------ Chemistries  Recent Labs  Lab 12/04/18 1128  12/08/18 0542  NA 141   < > 141  K 2.5*   < > 3.3*  CL 102   < > 111  CO2 27   < > 23  GLUCOSE 117*   < > 90  BUN 9   < > 8  CREATININE 0.83   < > 0.60  CALCIUM 9.6   < > 8.6*  MG  --    < > 2.0  AST 24  --   --   ALT 13  --   --   ALKPHOS 79  --   --   BILITOT 1.2  --   --    < > = values in this interval not displayed.   RADIOLOGY:  No results found. ASSESSMENT AND PLAN:    73  y/o female with PAF and chronic diastolic heart failure pEF by ECHO 09/2018 who presents to the emergency room due to 2 weeks of increased confusion.  1. 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 He is unsure if she is taking her medications appropriately. MRA head with multifocal intracranial arterial stenoses - Confusion could be related to subacute stroke which is likely 72-68 weeks old given the findings on the [facility].  Carotid Doppler ultrasound with no hemodynamically significant stenosis. -MRI with PCA stroke and significant intracranial stenosis more so on R side.   -Appreciate neurology input.  Recommendation is to continue anticoagulation with Eliquis.  Low-dose aspirin added per neurology recommendation. Patient seen by physical  therapist and occupational therapist.  Outpatient PT and OT recommended on discharge.  Case manager already assisting with setting up. Glycosylated hemoglobin level of 5.2  2. PAF: OK to continue Diltiazem since symptoms have been going on for longer then a few days. Continue Eliquis DC telemetry monitor  3. Hypokalemia: Repleted  Magnesium level okay  4. Chronic Diastolic heart failure:  No signs of exacerbation  5.  Urinary incontinence with urinary retention Foley placed continue Flomax DVT prophylaxis; Eliquis  All the records are reviewed and case discussed with Care Management/Social Worker. Management plans discussed with the patient, family and they are in agreement.  Updated husband present at bedside on treatment plans.   CODE STATUS: Full Code  TOTAL TIME TAKING CARE OF THIS PATIENT: 37 minutes.   More than 50% of the time was spent in counseling/coordination of care: YES  POSSIBLE D/C IN 2 DAYS, DEPENDING ON CLINICAL CONDITION.   Dustin Flock M.D on 12/08/2018 at 1:14 PM  Between 7am to 6pm - Pager - [phone]  After 6pm go to [email or web address] - Proofreader  [facility]  Office  [phone]  CC: Primary care physician; Guadalupe Maple, MD  Note: This dictation was prepared with Dragon dictation along with smaller phrase technology. Any transcriptional errors that result from this process are unintentional.

## 2018-12-08 NOTE — Progress Notes (Signed)
Pharmacy Electrolyte Monitoring Consult:  Pharmacy consulted to assist in monitoring and replacing electrolytes in this 73 y.o. female admitted on 12/04/2018 with Altered Mental Status and Tachycardia. Patient with past medical history significant for atrial fibrillation, breast cancer, hypertension, and malignant neoplasms.    Labs:  Sodium (mmol/L)  Date Value  12/08/2018 141  04/18/2016 144   Potassium (mmol/L)  Date Value  12/08/2018 3.3 (L)   Magnesium (mg/dL)  Date Value  12/08/2018 2.0   Phosphorus (mg/dL)  Date Value  02/10/2016 3.1   Calcium (mg/dL)  Date Value  12/08/2018 8.6 (L)   Albumin (g/dL)  Date Value  12/04/2018 4.4    Assessment/Plan: Patient received potassium 12mEq PO x 1 on 10/3. Potassium level decreased overnight. Patient with orders for potassium 4mEq PO x 1. Will order additional potassium 49mEq PO x 1 at 1600.   In setting of atrial fibrillation will replace for goal potassium ~ 4 and goal magnesium ~ 2.   Will obtain BMP/Magnesium with am labs.   Pharmacy will continue to monitor and adjust per consult.   Simpson,Michael L 12/08/2018 11:17 AM

## 2018-12-08 NOTE — Progress Notes (Addendum)
Ch received a referral to f/u with pt regarding AD completion. Ch spoke with pt spouse while the pt was being taken for a CT scan. Pt spouse shared that they are hopeful to transition the pt home instead of inside of a rehab. The pt is possibly going to have outpatient rehab. The spouse shared that he is a retired Public relations account executive and his sister is a Marine scientist, so they are able to make the needed accommodations at home. The spouse felt guilty for not bringing the pt in sooner even though the pt refused. The pt had a stroke and altered mental status at times which would not make her a candidate to finalize an AD based on my assessment. The pt spouse shared that the assistant unit leader would f/u with them on Monday. Ch provided a prayer cloth for pt (handed off to spouse) which was appreciated.    12/08/18 1500  Clinical Encounter Type  Visited With Patient and family together;Health care provider  Visit Type Social support;Spiritual support;Follow-up (AD education )  Referral From Chaplain  Consult/Referral To Chaplain  Spiritual Encounters  Spiritual Needs Emotional;Grief support  Stress Factors  Patient Stress Factors Health changes;Loss of control;Major life changes  Family Stress Factors Major life changes  Advance Directives (For Healthcare)  Does Patient Have a Medical Advance Directive? No  Would patient like information on creating a medical advance directive? Yes (Inpatient - patient defers creating a medical advance directive at this time - Information given)  [place] Directives  Does Patient Have a Mental Health Advance Directive? No

## 2018-12-09 LAB — BASIC METABOLIC PANEL
Anion gap: 7 (ref 5–15)
BUN: 9 mg/dL (ref 8–23)
CO2: 23 mmol/L (ref 22–32)
Calcium: 8.6 mg/dL — ABNORMAL LOW (ref 8.9–10.3)
Chloride: 109 mmol/L (ref 98–111)
Creatinine, Ser: 0.51 mg/dL (ref 0.44–1.00)
GFR calc Af Amer: >60 mL/min (ref >60)
GFR calc non Af Amer: >60 mL/min (ref >60)
Glucose, Bld: 114 mg/dL — ABNORMAL HIGH (ref 70–99)
Potassium: 3.7 mmol/L (ref 3.5–5.1)
Sodium: 139 mmol/L (ref 135–145)

## 2018-12-09 LAB — MAGNESIUM: Magnesium: 1.9 mg/dL (ref 1.7–2.4)

## 2018-12-09 MED ORDER — POTASSIUM CHLORIDE CRYS ER 20 MEQ PO TBCR: 20.0000 meq | EXTENDED_RELEASE_TABLET | Freq: Once | ORAL | Status: DC

## 2018-12-09 MED ORDER — ASPIRIN 81 MG PO TBEC: 81.0000 mg | DELAYED_RELEASE_TABLET | Freq: Every day | ORAL | 0 refills | Status: DC

## 2018-12-09 MED ORDER — ROSUVASTATIN CALCIUM 10 MG PO TABS: 10.0000 mg | ORAL_TABLET | Freq: Every day | ORAL | 0 refills | Status: DC

## 2018-12-09 MED ORDER — POTASSIUM CHLORIDE 20 MEQ PO PACK
20.0000 meq | PACK | Freq: Once | ORAL | Status: AC
  Administered 2018-12-09: 20 meq via ORAL
  Filled 2018-12-09: qty 1

## 2018-12-09 NOTE — Progress Notes (Signed)
Pt is being d/ced home.  Will be going home with a foley and will f/u w/urology.  PT cleared her to get outpt PT.  Husband is very anxious about her going home b/c of her confusion - keeps asking to go to [facility], even with foley.  Have just encouraged them to be pt with each other.  Husband seems very frustrated.  IV removed.  Will review d/c instructions, f/u appts and medications. Husband will transport her home.

## 2018-12-09 NOTE — Care Management Important Message (Signed)
Important Message  Patient Details  Name: Sreshta Malkiewicz MRN: [id number] Date of Birth: [birth year]   Medicare Important Message Given:  Yes     Juliann Pulse A Paul Trettin 12/09/2018, 11:10 AM

## 2018-12-09 NOTE — TOC Transition Note (Signed)
Transition of Care [facility]) - CM/SW Discharge Note   Patient Details  Name: Norma Hill MRN: [id number] Date of Birth: 07-15-1945  [facility] CM/SW Contact:  Candie Chroman, LCSW Phone Number: 12/09/2018, 2:19 PM   Clinical Narrative: PT is still recommending outpatient PT and patient feels comfortable with this. Husband worried about foley care because patient keeps asking to go to the bathroom. Husband is an EMT and his sister that lives next door is a Marine scientist. CSW encouraged them to call her PCP if they get home and realize they need home health. Patient's PCP is retiring but the letter stated they would assign her someone else to take over. No further concerns. CSW signing off.    Final next level of care: OP Rehab Barriers to Discharge: Barriers Resolved   Patient Goals and CMS Choice Patient states their goals for this hospitalization and ongoing recovery are:: To go home. CMS Medicare.gov Compare Post Acute Care list provided to:: Patient(Husband at bedside) Choice offered to / list presented to : (Outpatient therapy)  Discharge Placement                Patient to be transferred to facility by: Husband will transport home. Name of family member notified: Kavita Sarge Patient and family notified of of transfer: 12/09/18  Discharge Plan and Services     Post Acute Care Choice: (Outpatient PT)          DME Arranged: N/A         HH Arranged: NA          Social Determinants of Health (SDOH) Interventions     Readmission Risk Interventions No flowsheet data found.

## 2018-12-09 NOTE — Discharge Summary (Signed)
[name] at [facility], 73 y.o., DOB February 26, 1946, MRN [id number]. 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  Patient was evaluated and further work-up showed her to have a large right MCA stroke.  She was seen in consultation by neurology.  Underwent a work-up which showed multifactorial intra-cranial stenosis.  Patient was continued on Eliquis and aspirin.  She has been intermittently confused related to her stroke.  Patient was seen by physical therapy who is recommending home PT however since husband wants to have her to have outpatient PT.  Patient also was noted to have urinary retention the symptoms she was experiencing even prior to hospitalization before her stroke.  She will need outpatient urology for Foley removal.            Consults  neurology  Significant Tests:  See full reports for all details     Ct Head Wo Contrast  Result Date: 12/08/2018 CLINICAL DATA:  73 year old female with altered mental status and confusion. Posterior right MCA territory infarct diagnosed on 12/04/2018. EXAM: CT HEAD WITHOUT CONTRAST TECHNIQUE: Contiguous axial images were obtained from the base of the skull through the vertex without intravenous contrast. COMPARISON:  Brain MRI and intracranial MRA 12/04/2018 end earlier. FINDINGS:  Brain: Patchy cytotoxic edema in the posterior right MCA territory appears stable. No associated hemorrhage or mass effect. Elsewhere Stable gray-white matter differentiation throughout the brain. No midline shift, ventriculomegaly, mass effect, evidence of mass lesion, or new cortically based infarct. Vascular: Mild Calcified atherosclerosis at the skull base. No suspicious intracranial vascular hyperdensity. Skull: No acute osseous abnormality identified. Sinuses/Orbits: Visualized paranasal sinuses and mastoids are stable and well pneumatized. Other: Visualized orbits and scalp soft tissues are within normal limits. IMPRESSION: 1. Stable CT appearance of the right MCA territory infarct since 12/04/2018. No associated hemorrhage or mass effect. 2. No new intracranial abnormality. Electronically Signed   By: Genevie Ann M.D.   On: 12/08/2018 13:52   Ct Head Wo Contrast  Result Date: 12/04/2018 CLINICAL DATA:  Confusion EXAM: CT HEAD WITHOUT CONTRAST TECHNIQUE: Contiguous axial images were obtained from the base of the skull through the vertex without intravenous contrast. COMPARISON:  None. FINDINGS: Brain: Low-density changes within the right parieto-occipital region suggesting a acute to subacute infarct. No intracranial hemorrhage, mass, hydrocephalus, or midline shift. Small remote lacunar infarct in the left basal ganglia. Scattered low-density changes within the periventricular and subcortical white matter compatible with chronic microvascular ischemic change. Mild diffuse cerebral volume loss. Vascular: Mild atherosclerotic calcifications involving the large vessels of the skull base. No unexpected hyperdense vessel. Skull: Normal. Negative for fracture or focal lesion. Sinuses/Orbits: No acute finding. Other: None. IMPRESSION: 1. 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 Follow-up studies with MRI, as clinically indicated. 2. No acute  intracranial hemorrhage. 3. Microvascular ischemic changes and cerebral volume loss. These results were called by telephone at the time of interpretation on 12/04/2018  at 12:42 pm to provider [facility] , who verbally acknowledged these results. Electronically Signed   By: Davina Poke M.D.   On: 12/04/2018 12:45   Mr Angio Head Wo Contrast  Result Date: 12/04/2018 CLINICAL DATA:  Stroke, follow-up. Additional history provided: 73 year old female with history of atrial fibrillation on Eliquis, hypertension presents to ED for altered mental status/confusion. EXAM: MRI HEAD WITHOUT CONTRAST MRA HEAD WITHOUT CONTRAST TECHNIQUE: Multiplanar, multiecho pulse sequences of the brain and surrounding structures were obtained without intravenous contrast. Angiographic images of the head were obtained using MRA technique without contrast. COMPARISON:  None. Noncontrast head CT performed earlier the same day 12/04/2018. FINDINGS: MRI HEAD FINDINGS Brain: Multiple sequences are significantly motion degraded, limiting evaluation There is a moderate-size region of cortical/subcortical restricted diffusion within the right parietal lobe consistent with acute infarct. An additional small acute cortical/subcortical infarct is also present within the right occipital lobe. Associated T2/FLAIR hyperintensity at these sites. Mild regional mass effect without midline shift or significant effacement of the ventricular system. No evidence of intracranial mass. No extra-axial fluid collection. No chronic intracranial blood products. Moderate scattered and confluent T2/FLAIR hyperintensity within the cerebral white matter is nonspecific, but consistent with chronic small vessel ischemic disease. Mild generalized parenchymal atrophy. Vascular: Reported separately Skull and upper cervical spine: Incompletely assessed cervical spondylosis. A C3-C4 posterior disc osteophyte results in at least mild spinal canal narrowing.  Sinuses/Orbits: Visualized orbits demonstrate no acute abnormality. No significant paranasal sinus disease or mastoid effusion. MRA HEAD FINDINGS The examination is motion degraded, limiting evaluation for intracranial arterial stenoses and for small aneurysms. The bilateral intracranial internal carotid arteries are patent. There is an apparent high-grade narrowing of the petrous left ICA at the level of the skull base, although this may be at least partially related to artifact arising from the skull base. There are two focal stenoses within the cavernous left ICA, up to moderate in severity. No more than mild luminal narrowing of the intracranial right ICA. The right middle and anterior cerebral arteries are patent. There is apparent moderate stenosis of the distal M1 right middle cerebral artery and within an adjacent proximal inferior division M2 branch. Motion degradation limits evaluation of the M2 and more distal MCA branches. The left middle and anterior cerebral arteries are patent. Significant motion degradation limits evaluation of the M2 and more distal left MCA branches. Apparent at least moderate proximal stenosis within the M2 superior division. No intracranial aneurysm is identified. The visualized intracranial vertebral arteries are patent without significant stenosis. The basilar artery is patent without significant stenosis. The bilateral posterior cerebral arteries are patent. No significant proximal stenosis within the right posterior cerebral artery. Moderate/severe focal stenosis within the P1 left posterior cerebral artery. Moderate focal stenosis within the P2 left posterior cerebral artery. There is also suggestion of significant atherosclerotic irregularity within the distal P2 and more distal left PCA segments. Findings under the MRA impression will be called to the ordering clinician or representative by the Radiologist Assistant, and communication documented in the PACS or zVision  Dashboard. IMPRESSION: MRI brain: 1. Moderate-sized acute cortical/subcortical right MCA territory parietal infarction. 2. Separate smaller acute cortical/subcortical infarct within the right occipital lobe. 3. Mild regional mass effect without midline shift or significant effacement of the ventricular system. 4. Generalized parenchymal atrophy with moderate chronic small vessel ischemic disease. 5. Incompletely assessed cervical spondylosis. MRA head: 1. Examination is significantly limited by motion artifact. 2. Multifocal intracranial arterial stenoses, presumably on the basis of atherosclerotic  disease, most notably as follows. 3. Apparent moderate stenosis within the distal M1 right MCA and within an adjacent proximal inferior division M2 branch. 4. Apparent high-grade narrowing of the petrous left ICA at the level of the skull base. However, this stenosis may be overestimated due to artifact arising from the skull base. 5. Two focal stenosis within the cavernous left ICA, up to moderate in severity. 6. Apparent at least moderate stenosis within the proximal left M2 superior division. 7. Moderate/severe stenosis within the P1 left PCA. Moderate focal stenosis within the P2 left PCA. There is also suggestion of significant atherosclerotic irregularity within the distal P2 and more distal left PCA. Electronically Signed   By: Kellie Simmering   On: 12/04/2018 18:02   Mr Brain Wo Contrast  Result Date: 12/04/2018 CLINICAL DATA:  Stroke, follow-up. Additional history provided: 73 year old female with history of atrial fibrillation on Eliquis, hypertension presents to ED for altered mental status/confusion. EXAM: MRI HEAD WITHOUT CONTRAST MRA HEAD WITHOUT CONTRAST TECHNIQUE: Multiplanar, multiecho pulse sequences of the brain and surrounding structures were obtained without intravenous contrast. Angiographic images of the head were obtained using MRA technique without contrast. COMPARISON:  None. Noncontrast head CT  performed earlier the same day 12/04/2018. FINDINGS: MRI HEAD FINDINGS Brain: Multiple sequences are significantly motion degraded, limiting evaluation There is a moderate-size region of cortical/subcortical restricted diffusion within the right parietal lobe consistent with acute infarct. An additional small acute cortical/subcortical infarct is also present within the right occipital lobe. Associated T2/FLAIR hyperintensity at these sites. Mild regional mass effect without midline shift or significant effacement of the ventricular system. No evidence of intracranial mass. No extra-axial fluid collection. No chronic intracranial blood products. Moderate scattered and confluent T2/FLAIR hyperintensity within the cerebral white matter is nonspecific, but consistent with chronic small vessel ischemic disease. Mild generalized parenchymal atrophy. Vascular: Reported separately Skull and upper cervical spine: Incompletely assessed cervical spondylosis. A C3-C4 posterior disc osteophyte results in at least mild spinal canal narrowing. Sinuses/Orbits: Visualized orbits demonstrate no acute abnormality. No significant paranasal sinus disease or mastoid effusion. MRA HEAD FINDINGS The examination is motion degraded, limiting evaluation for intracranial arterial stenoses and for small aneurysms. The bilateral intracranial internal carotid arteries are patent. There is an apparent high-grade narrowing of the petrous left ICA at the level of the skull base, although this may be at least partially related to artifact arising from the skull base. There are two focal stenoses within the cavernous left ICA, up to moderate in severity. No more than mild luminal narrowing of the intracranial right ICA. The right middle and anterior cerebral arteries are patent. There is apparent moderate stenosis of the distal M1 right middle cerebral artery and within an adjacent proximal inferior division M2 branch. Motion degradation limits  evaluation of the M2 and more distal MCA branches. The left middle and anterior cerebral arteries are patent. Significant motion degradation limits evaluation of the M2 and more distal left MCA branches. Apparent at least moderate proximal stenosis within the M2 superior division. No intracranial aneurysm is identified. The visualized intracranial vertebral arteries are patent without significant stenosis. The basilar artery is patent without significant stenosis. The bilateral posterior cerebral arteries are patent. No significant proximal stenosis within the right posterior cerebral artery. Moderate/severe focal stenosis within the P1 left posterior cerebral artery. Moderate focal stenosis within the P2 left posterior cerebral artery. There is also suggestion of significant atherosclerotic irregularity within the distal P2 and more distal left PCA segments. Findings under the  MRA impression will be called to the ordering clinician or representative by the Radiologist Assistant, and communication documented in the PACS or zVision Dashboard. IMPRESSION: MRI brain: 1. Moderate-sized acute cortical/subcortical right MCA territory parietal infarction. 2. Separate smaller acute cortical/subcortical infarct within the right occipital lobe. 3. Mild regional mass effect without midline shift or significant effacement of the ventricular system. 4. Generalized parenchymal atrophy with moderate chronic small vessel ischemic disease. 5. Incompletely assessed cervical spondylosis. MRA head: 1. Examination is significantly limited by motion artifact. 2. Multifocal intracranial arterial stenoses, presumably on the basis of atherosclerotic disease, most notably as follows. 3. Apparent moderate stenosis within the distal M1 right MCA and within an adjacent proximal inferior division M2 branch. 4. Apparent high-grade narrowing of the petrous left ICA at the level of the skull base. However, this stenosis may be overestimated due to  artifact arising from the skull base. 5. Two focal stenosis within the cavernous left ICA, up to moderate in severity. 6. Apparent at least moderate stenosis within the proximal left M2 superior division. 7. Moderate/severe stenosis within the P1 left PCA. Moderate focal stenosis within the P2 left PCA. There is also suggestion of significant atherosclerotic irregularity within the distal P2 and more distal left PCA. Electronically Signed   By: Kellie Simmering   On: 12/04/2018 18:02   US Carotid Bilateral (at [facility] Only)  Result Date: 12/05/2018 CLINICAL DATA:  73 year old female with a history of cerebrovascular accident EXAM: BILATERAL CAROTID DUPLEX ULTRASOUND TECHNIQUE: Pearline Cables scale imaging, color Doppler and duplex ultrasound were performed of bilateral carotid and vertebral arteries in the neck. COMPARISON:  None. FINDINGS: Criteria: Quantification of carotid stenosis is based on velocity parameters that correlate the residual internal carotid diameter with NASCET-based stenosis levels, using the diameter of the distal internal carotid lumen as the denominator for stenosis measurement. The following velocity measurements were obtained: RIGHT ICA:  Systolic 65 cm/sec, Diastolic 16 cm/sec CCA:  82 cm/sec SYSTOLIC ICA/CCA RATIO:  0.8 ECA:  133 cm/sec LEFT ICA:  Systolic 80 cm/sec, Diastolic 14 cm/sec CCA:  70 cm/sec SYSTOLIC ICA/CCA RATIO:  1.1 ECA:  66 cm/sec Right Brachial SBP: Not acquired Left Brachial SBP: Not acquired RIGHT CAROTID ARTERY: No significant calcifications of the right common carotid artery. Intermediate waveform maintained. Heterogeneous and partially calcified plaque at the right carotid bifurcation. No significant lumen shadowing. Low resistance waveform of the right ICA. No significant tortuosity. RIGHT VERTEBRAL ARTERY: Antegrade flow with low resistance waveform. LEFT CAROTID ARTERY: No significant calcifications of the left common carotid artery. Intermediate waveform maintained.  Heterogeneous and partially calcified plaque at the left carotid bifurcation without significant lumen shadowing. Low resistance waveform of the left ICA. No significant tortuosity. LEFT VERTEBRAL ARTERY:  Antegrade flow with low resistance waveform. IMPRESSION: Color duplex indicates minimal heterogeneous plaque, with no hemodynamically significant stenosis by duplex criteria in the extracranial cerebrovascular circulation. Signed, Dulcy Fanny. Dellia Nims, RPVI [facility] Electronically Signed   By: Corrie Mckusick D.O.   On: 12/05/2018 08:05       Today   Subjective:   Norma Hill patient currently denying any symptoms Objective:   Blood pressure 132/72, pulse 98, temperature 98.3 F (36.8 C), temperature source Oral, resp. rate (!) 22, height 5\' 5"  (1.651 m), weight 66.7 kg, SpO2 94 %.  .  Intake/Output Summary (Last 24 hours) at 12/09/2018 1354 Last data filed at 12/09/2018 1300 Gross per 24 hour  Intake 808.39 ml  Output 525  ml  Net 283.39 ml    Exam VITAL SIGNS: Blood pressure 132/72, pulse 98, temperature 98.3 F (36.8 C), temperature source Oral, resp. rate (!) 22, height 5\' 5"  (1.651 m), weight 66.7 kg, SpO2 94 %.  GENERAL:  73 y.o.-year-old patient lying in the bed with no acute distress.  EYES: Pupils equal, round, reactive to light and accommodation. No scleral icterus. Extraocular muscles intact.  HEENT: Head atraumatic, normocephalic. Oropharynx and nasopharynx clear.  NECK:  Supple, no jugular venous distention. No thyroid enlargement, no tenderness.  LUNGS: Normal breath sounds bilaterally, no wheezing, rales,rhonchi or crepitation. No use of accessory muscles of respiration.  CARDIOVASCULAR: S1, S2 normal. No murmurs, rubs, or gallops.  ABDOMEN: Soft, nontender, nondistended. Bowel sounds present. No organomegaly or mass.  EXTREMITIES: No pedal edema, cyanosis, or clubbing.  NEUROLOGIC: Cranial nerves II through  XII are intact. Muscle strength 5/5 in all extremities. Sensation intact. Gait not checked.  PSYCHIATRIC: The patient is alert and oriented x 3.  SKIN: No obvious rash, lesion, or ulcer.   Data Review     CBC w Diff:  Lab Results  Component Value Date   WBC 6.1 12/06/2018   HGB 11.0 (L) 12/06/2018   HCT 34.4 (L) 12/06/2018   PLT 239 12/06/2018   LYMPHOPCT 30 02/09/2016   MONOPCT 8 02/09/2016   EOSPCT 1 02/09/2016   BASOPCT 1 02/09/2016   CMP:  Lab Results  Component Value Date   NA 139 12/09/2018   NA 144 04/18/2016   K 3.7 12/09/2018   CL 109 12/09/2018   CO2 23 12/09/2018   BUN 9 12/09/2018   BUN 10 04/18/2016   CREATININE 0.51 12/09/2018   PROT 7.0 12/04/2018   ALBUMIN 4.4 12/04/2018   BILITOT 1.2 12/04/2018   ALKPHOS 79 12/04/2018   AST 24 12/04/2018   ALT 13 12/04/2018  .  Micro Results Recent Results (from the past 240 hour(s))  SARS CORONAVIRUS 2 (TAT 6-24 HRS) Nasopharyngeal Nasopharyngeal Swab     Status: None   Collection Time: 12/04/18 12:53 PM   Specimen: Nasopharyngeal Swab  Result Value Ref Range Status   SARS Coronavirus 2 NEGATIVE NEGATIVE Final    Comment: (NOTE) SARS-CoV-2 target nucleic acids are NOT DETECTED. The SARS-CoV-2 RNA is generally detectable in upper and lower respiratory specimens during the acute phase of infection. Negative results do not preclude SARS-CoV-2 infection, do not rule out co-infections with other pathogens, and should not be used as the sole basis for treatment or other patient management decisions. Negative results must be combined with clinical observations, patient history, and epidemiological information. The expected result is Negative. Fact Sheet for Patients: SugarRoll.be Fact Sheet for Healthcare Providers: https://www.woods-mathews.com/ This test is not yet approved or cleared by the [place] FDA and  has been authorized for detection and/or diagnosis of  SARS-CoV-2 by FDA under an Emergency Use Authorization (EUA). This EUA will remain  in effect (meaning this test can be used) for the duration of the COVID-19 declaration under Section 56 4(b)(1) of the Act, 21 U.S.C. section 360bbb-3(b)(1), unless the authorization is terminated or revoked sooner. Performed at [facility], [place] [address] [place], [place] [address]         Code Status Orders  (From admission, onward)         Start     Ordered   12/04/18 1446  Full code  Continuous     12/04/18 1445        Code Status  History    Date Active Date Inactive Code Status Order ID Comments User Context   09/30/2018 0017 10/01/2018 1932 Full Code [id number]  Harrie Foreman, MD Inpatient   05/12/2017 2014 05/16/2017 1749 Full Code [id number]  Dustin Flock, MD Inpatient   02/10/2016 0052 02/11/2016 1603 Full Code [id number]  Hugelmeyer, Ubaldo Glassing, DO Inpatient   Advance Care Planning Activity          Follow-up Information    Guadalupe Maple, MD Follow up in 6 day(s).   Specialty: Family Medicine Contact information: [place] [address] [phone]        Anabel Bene, MD Follow up in 2 week(s).   Specialty: Neurology Why: f/u large cva Contact information: [place] [facility] [place] [id number] [phone]           Discharge Medications   Allergies as of 12/09/2018   No Known Allergies     Medication List    STOP taking these medications   acidophilus Caps capsule   diltiazem 300 MG 24 hr capsule Commonly known as: CARDIZEM CD     TAKE these medications   apixaban 5 MG Tabs tablet Commonly known as: ELIQUIS Take 1 tablet (5 mg total) by mouth 2 (two) times daily.   aspirin 81 MG EC tablet Take 1 tablet (81 mg total) by mouth daily. Start taking on: December 10, 2018   diphenhydramine-acetaminophen 25-500 MG Tabs tablet Commonly known as: TYLENOL PM Take 1-2 tablets by mouth at  bedtime as needed (sleep/pain).   lisinopril 5 MG tablet Commonly known as: ZESTRIL Take 5 mg by mouth daily.   rosuvastatin 10 MG tablet Commonly known as: CRESTOR Take 1 tablet (10 mg total) by mouth daily.          Total Time in preparing paper work, data evaluation and todays exam - 26 minutes  Dustin Flock M.D on 12/09/2018 at [place]  [phone]

## 2018-12-09 NOTE — Progress Notes (Signed)
Pharmacy Electrolyte Monitoring Consult:  Pharmacy consulted to assist in monitoring and replacing electrolytes in this 73 y.o. female admitted on 12/04/2018 with Altered Mental Status and Tachycardia. Patient with past medical history significant for atrial fibrillation, breast cancer, hypertension, and malignant neoplasms.    Labs:  Sodium (mmol/L)  Date Value  12/09/2018 139  04/18/2016 144   Potassium (mmol/L)  Date Value  12/09/2018 3.7   Magnesium (mg/dL)  Date Value  12/09/2018 1.9   Phosphorus (mg/dL)  Date Value  02/10/2016 3.1   Calcium (mg/dL)  Date Value  12/09/2018 8.6 (L)   Albumin (g/dL)  Date Value  12/04/2018 4.4    Assessment/Plan: K 3.7 Mag 1.9 Scr 0.51 Will order KCL 20 meq PO x 1 for goal K closer to 4.0  In setting of atrial fibrillation will replace for goal potassium ~ 4 and goal magnesium ~ 2.   Will obtain BMP/Magnesium with am labs.   Pharmacy will continue to monitor and adjust per consult.   Norma Hill A 12/09/2018 10:32 AM

## 2018-12-09 NOTE — Progress Notes (Signed)
Physical Therapy Treatment Patient Details Name: Norma Hill MRN: [id number] DOB: March 12, 1945 Today's Date: 12/09/2018    History of Present Illness 73 y.o. female  with a past medical history of atrial fibrillation on elequis, hypertension, presents to the emergency department for altered mental status/confusion. 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    PT Comments    Upon entry, pt is supine in bed. She does not report pain. States multiple times that she needs to go to that bathroom, but she has catheter. She was able to perform bed mobility, transfers and amb independently with supervision from PT. Pt does at times reach out in front of her for support from countertops or railings, though she doesn't require their use. Pt will continue to benefit from skilled PT to address deficits in strength, mobility, and balance. Current recommendation remains OP PT due to mobility status, but pt's spouse has reservations.    Follow Up Recommendations  Outpatient PT     Equipment Recommendations  None recommended by PT    Recommendations for Other Services       Precautions / Restrictions Precautions Precautions: Fall Restrictions Weight Bearing Restrictions: No    Mobility  Bed Mobility Overal bed mobility: Independent             General bed mobility comments: Able to sit EOB without assist  Transfers Overall transfer level: Independent Equipment used: None             General transfer comment: Pt rose to standing without assist or use of AD's  Ambulation/Gait Ambulation/Gait assistance: Min guard Gait Distance (Feet): 60 Feet Assistive device: None Gait Pattern/deviations: Step-through pattern     General Gait Details: Pt amb successfully, appeared to amb at slow speed and wasn't willing to continue walking. Did at times appear to be reaching for countertops or railings   Stairs             Wheelchair Mobility     Modified Rankin (Stroke Patients Only)       Balance Overall balance assessment: Needs assistance(UE support from bed/rails) Sitting-balance support: Feet supported Sitting balance-Leahy Scale: Normal     Standing balance support: No upper extremity supported;During functional activity Standing balance-Leahy Scale: Good Standing balance comment: Pt at times reaching for objects to hold on to, but is able to maintain upright posture when amb                            Cognition Arousal/Alertness: Awake/alert Behavior During Therapy: WFL for tasks assessed/performed Overall Cognitive Status: Within Functional Limits for tasks assessed                                 General Comments: Pt does state repeatedly that she needs to use the bathroom and was reminded of her catheter      Exercises Other Exercises Other Exercises: Seated EOB, bilateral, 10 reps: LAQ, March; PT supervision Other Exercises: Supine, 10 reps, bilateral: QS, GS, SLR, HS    General Comments General comments (skin integrity, edema, etc.): Pt was not enthused to participate today, appears to be ready to leave and wanted to eat lunch.       Pertinent Vitals/Pain Pain Assessment: No/denies pain    Home Living  Prior Function            PT Goals (current goals can now be found in the care plan section) Acute Rehab PT Goals Patient Stated Goal: To return home PT Goal Formulation: With patient/family Time For Goal Achievement: 12/19/18 Potential to Achieve Goals: Good Progress towards PT goals: Progressing toward goals    Frequency    Min 2X/week      PT Plan Current plan remains appropriate    Co-evaluation              AM-PAC PT "6 Clicks" Mobility   Outcome Measure  Help needed turning from your back to your side while in a flat bed without using bedrails?: None Help needed moving from lying on your back to sitting on the  side of a flat bed without using bedrails?: None Help needed moving to and from a bed to a chair (including a wheelchair)?: None Help needed standing up from a chair using your arms (e.g., wheelchair or bedside chair)?: None Help needed to walk in hospital room?: None Help needed climbing 3-5 steps with a railing? : None 6 Click Score: 24    End of Session Equipment Utilized During Treatment: Gait belt Activity Tolerance: Patient tolerated treatment well Patient left: in bed;with call bell/phone within reach;with nursing/sitter in room;with family/visitor present   PT Visit Diagnosis: Other symptoms and signs involving the nervous system (R29.898);Muscle weakness (generalized) (M62.81)     Time: [id number] PT Time Calculation (min) (ACUTE ONLY): 19 min  Charges:                       Dixie Dials, SPT    Jamiyla Ishee 12/09/2018, 1:01 PM

## 2018-12-11 ENCOUNTER — Inpatient Hospital Stay
Admission: EM | Admit: 2018-12-11 | Discharge: 2018-12-20 | DRG: 178 | Disposition: A | Discharge status: Skilled Nursing Facility | Payer: Medicare Other | Attending: Internal Medicine | Admitting: Internal Medicine

## 2018-12-11 ENCOUNTER — Emergency Department: Payer: Medicare Other

## 2018-12-11 ENCOUNTER — Telehealth: Payer: Self-pay

## 2018-12-11 ENCOUNTER — Encounter: Payer: Self-pay | Admitting: *Deleted

## 2018-12-11 ENCOUNTER — Other Ambulatory Visit: Payer: Self-pay

## 2018-12-11 DIAGNOSIS — R339 Retention of urine, unspecified: Secondary | ICD-10-CM | POA: Diagnosis present

## 2018-12-11 DIAGNOSIS — I4819 Other persistent atrial fibrillation: Secondary | ICD-10-CM | POA: Diagnosis not present

## 2018-12-11 DIAGNOSIS — E876 Hypokalemia: Secondary | ICD-10-CM | POA: Diagnosis present

## 2018-12-11 DIAGNOSIS — E785 Hyperlipidemia, unspecified: Secondary | ICD-10-CM | POA: Diagnosis present

## 2018-12-11 DIAGNOSIS — Z23 Encounter for immunization: Secondary | ICD-10-CM

## 2018-12-11 DIAGNOSIS — I4821 Permanent atrial fibrillation: Secondary | ICD-10-CM | POA: Diagnosis not present

## 2018-12-11 DIAGNOSIS — Z20828 Contact with and (suspected) exposure to other viral communicable diseases: Secondary | ICD-10-CM | POA: Diagnosis present

## 2018-12-11 DIAGNOSIS — Z9049 Acquired absence of other specified parts of digestive tract: Secondary | ICD-10-CM

## 2018-12-11 DIAGNOSIS — G8194 Hemiplegia, unspecified affecting left nondominant side: Secondary | ICD-10-CM | POA: Diagnosis present

## 2018-12-11 DIAGNOSIS — I110 Hypertensive heart disease with heart failure: Secondary | ICD-10-CM | POA: Diagnosis present

## 2018-12-11 DIAGNOSIS — Z853 Personal history of malignant neoplasm of breast: Secondary | ICD-10-CM

## 2018-12-11 DIAGNOSIS — Z7901 Long term (current) use of anticoagulants: Secondary | ICD-10-CM | POA: Diagnosis not present

## 2018-12-11 DIAGNOSIS — I420 Dilated cardiomyopathy: Secondary | ICD-10-CM | POA: Diagnosis not present

## 2018-12-11 DIAGNOSIS — J690 Pneumonitis due to inhalation of food and vomit: Secondary | ICD-10-CM | POA: Diagnosis not present

## 2018-12-11 DIAGNOSIS — F0390 Unspecified dementia without behavioral disturbance: Secondary | ICD-10-CM | POA: Diagnosis present

## 2018-12-11 DIAGNOSIS — F0280 Dementia in other diseases classified elsewhere without behavioral disturbance: Secondary | ICD-10-CM | POA: Diagnosis not present

## 2018-12-11 DIAGNOSIS — Z79899 Other long term (current) drug therapy: Secondary | ICD-10-CM | POA: Diagnosis not present

## 2018-12-11 DIAGNOSIS — J189 Pneumonia, unspecified organism: Secondary | ICD-10-CM | POA: Diagnosis present

## 2018-12-11 DIAGNOSIS — R410 Disorientation, unspecified: Secondary | ICD-10-CM | POA: Diagnosis not present

## 2018-12-11 DIAGNOSIS — I4891 Unspecified atrial fibrillation: Secondary | ICD-10-CM

## 2018-12-11 DIAGNOSIS — G301 Alzheimer's disease with late onset: Secondary | ICD-10-CM | POA: Diagnosis not present

## 2018-12-11 DIAGNOSIS — I951 Orthostatic hypotension: Secondary | ICD-10-CM | POA: Diagnosis not present

## 2018-12-11 DIAGNOSIS — Z8673 Personal history of transient ischemic attack (TIA), and cerebral infarction without residual deficits: Secondary | ICD-10-CM | POA: Diagnosis not present

## 2018-12-11 DIAGNOSIS — R55 Syncope and collapse: Secondary | ICD-10-CM | POA: Diagnosis present

## 2018-12-11 DIAGNOSIS — I639 Cerebral infarction, unspecified: Secondary | ICD-10-CM | POA: Diagnosis present

## 2018-12-11 DIAGNOSIS — I429 Cardiomyopathy, unspecified: Secondary | ICD-10-CM | POA: Diagnosis present

## 2018-12-11 LAB — BASIC METABOLIC PANEL
Anion gap: 10 (ref 5–15)
BUN: 14 mg/dL (ref 8–23)
CO2: 24 mmol/L (ref 22–32)
Calcium: 9.7 mg/dL (ref 8.9–10.3)
Chloride: 106 mmol/L (ref 98–111)
Creatinine, Ser: 0.83 mg/dL (ref 0.44–1.00)
GFR calc Af Amer: >60 mL/min (ref >60)
GFR calc non Af Amer: >60 mL/min (ref >60)
Glucose, Bld: 130 mg/dL — ABNORMAL HIGH (ref 70–99)
Potassium: 3.8 mmol/L (ref 3.5–5.1)
Sodium: 140 mmol/L (ref 135–145)

## 2018-12-11 LAB — CBC
HCT: 46.5 % — ABNORMAL HIGH (ref 36.0–46.0)
Hemoglobin: 14.6 g/dL (ref 12.0–15.0)
MCH: 27.9 pg (ref 26.0–34.0)
MCHC: 31.4 g/dL (ref 30.0–36.0)
MCV: 88.7 fL (ref 80.0–100.0)
Platelets: 240 10*3/uL (ref 150–400)
RBC: 5.24 MIL/uL — ABNORMAL HIGH (ref 3.87–5.11)
RDW: 15.5 % (ref 11.5–15.5)
WBC: 7.9 10*3/uL (ref 4.0–10.5)
nRBC: 0 % (ref 0.0–0.2)

## 2018-12-11 LAB — TROPONIN I (HIGH SENSITIVITY): Troponin I (High Sensitivity): 10 ng/L (ref <18)

## 2018-12-11 LAB — LACTIC ACID, PLASMA: Lactic Acid, Venous: 1.7 mmol/L (ref 0.5–1.9)

## 2018-12-11 LAB — SARS CORONAVIRUS 2 BY RT PCR (HOSPITAL ORDER, PERFORMED IN ~~LOC~~ HOSPITAL LAB): SARS Coronavirus 2: NEGATIVE

## 2018-12-11 MED ORDER — ONDANSETRON HCL 4 MG PO TABS: 4.0000 mg | ORAL_TABLET | Freq: Four times a day (QID) | ORAL | Status: DC | PRN

## 2018-12-11 MED ORDER — ASPIRIN EC 81 MG PO TBEC
81.0000 mg | DELAYED_RELEASE_TABLET | Freq: Every day | ORAL | Status: DC
  Administered 2018-12-13 – 2018-12-20 (×8): 81 mg via ORAL
  Filled 2018-12-11 (×8): qty 1

## 2018-12-11 MED ORDER — SODIUM CHLORIDE 0.9% FLUSH
3.0000 mL | Freq: Once | INTRAVENOUS | Status: AC
  Administered 2018-12-12: 07:00:00 3 mL via INTRAVENOUS

## 2018-12-11 MED ORDER — DIPHENHYDRAMINE-APAP (SLEEP) 25-500 MG PO TABS: 1.0000 | ORAL_TABLET | Freq: Every evening | ORAL | Status: DC | PRN

## 2018-12-11 MED ORDER — GUAIFENESIN ER 600 MG PO TB12
600.0000 mg | ORAL_TABLET | Freq: Two times a day (BID) | ORAL | Status: DC
  Administered 2018-12-12 – 2018-12-20 (×17): 600 mg via ORAL
  Filled 2018-12-11 (×16): qty 1

## 2018-12-11 MED ORDER — SODIUM CHLORIDE 0.9 % IV SOLN
2.0000 g | INTRAVENOUS | Status: DC
  Administered 2018-12-12: 2 g via INTRAVENOUS
  Filled 2018-12-11: qty 20

## 2018-12-11 MED ORDER — DILTIAZEM HCL 25 MG/5ML IV SOLN
10.0000 mg | Freq: Once | INTRAVENOUS | Status: AC
  Administered 2018-12-11: 10 mg via INTRAVENOUS
  Filled 2018-12-11: qty 5

## 2018-12-11 MED ORDER — SODIUM CHLORIDE 0.9 % IV SOLN
3.0000 g | Freq: Once | INTRAVENOUS | Status: AC
  Administered 2018-12-11: 23:00:00 3 g via INTRAVENOUS
  Filled 2018-12-11: qty 8

## 2018-12-11 MED ORDER — SODIUM CHLORIDE 0.9 % IV BOLUS
1000.0000 mL | Freq: Once | INTRAVENOUS | Status: AC
  Administered 2018-12-11: 22:00:00 1000 mL via INTRAVENOUS

## 2018-12-11 MED ORDER — TRAZODONE HCL 50 MG PO TABS
25.0000 mg | ORAL_TABLET | Freq: Every evening | ORAL | Status: DC | PRN
  Administered 2018-12-13: 25 mg via ORAL
  Filled 2018-12-11: qty 1

## 2018-12-11 MED ORDER — SODIUM CHLORIDE 0.9 % IV SOLN
500.0000 mg | INTRAVENOUS | Status: DC
  Administered 2018-12-12: 01:00:00 500 mg via INTRAVENOUS
  Filled 2018-12-11: qty 500

## 2018-12-11 MED ORDER — APIXABAN 5 MG PO TABS
5.0000 mg | ORAL_TABLET | Freq: Two times a day (BID) | ORAL | Status: DC
  Administered 2018-12-12 – 2018-12-20 (×17): 5 mg via ORAL
  Filled 2018-12-11 (×16): qty 1

## 2018-12-11 MED ORDER — LISINOPRIL 5 MG PO TABS
5.0000 mg | ORAL_TABLET | Freq: Every day | ORAL | Status: DC
  Administered 2018-12-13 – 2018-12-16 (×4): 5 mg via ORAL
  Filled 2018-12-11 (×4): qty 1

## 2018-12-11 MED ORDER — ACETAMINOPHEN 325 MG PO TABS: 650.0000 mg | ORAL_TABLET | Freq: Four times a day (QID) | ORAL | Status: DC | PRN

## 2018-12-11 MED ORDER — MAGNESIUM HYDROXIDE 400 MG/5ML PO SUSP: 30.0000 mL | Freq: Every day | ORAL | Status: DC | PRN

## 2018-12-11 MED ORDER — ONDANSETRON HCL 4 MG/2ML IJ SOLN: 4.0000 mg | Freq: Four times a day (QID) | INTRAMUSCULAR | Status: DC | PRN

## 2018-12-11 MED ORDER — ROSUVASTATIN CALCIUM 10 MG PO TABS
10.0000 mg | ORAL_TABLET | Freq: Every day | ORAL | Status: DC
  Administered 2018-12-12 – 2018-12-19 (×7): 10 mg via ORAL
  Filled 2018-12-11 (×7): qty 1

## 2018-12-11 MED ORDER — SODIUM CHLORIDE 0.9 % IV SOLN
INTRAVENOUS | Status: DC
  Administered 2018-12-12 – 2018-12-13 (×4): via INTRAVENOUS

## 2018-12-11 MED ORDER — ACETAMINOPHEN 650 MG RE SUPP: 650.0000 mg | Freq: Four times a day (QID) | RECTAL | Status: DC | PRN

## 2018-12-11 MED ORDER — LEVOFLOXACIN IN D5W 750 MG/150ML IV SOLN
750.0000 mg | Freq: Once | INTRAVENOUS | Status: AC
  Administered 2018-12-11: 750 mg via INTRAVENOUS
  Filled 2018-12-11: qty 150

## 2018-12-11 NOTE — ED Provider Notes (Signed)
[facility] Emergency Department Provider Note  Time seen: 9:50 PM  I have reviewed the triage vital signs and the nursing notes.   HISTORY  Chief Complaint Loss of Consciousness   HPI Norma Hill is a 73 y.o. female with a past medical history of atrial fibrillation, hypertension, on Eliquis, recent large CVA presents to the emergency department after syncopal event.  According to the husband patient was discharged on the hospital day before yesterday.  Since going home patient has continued to be significantly confused and requires significant assistance with nearly any activity per husband.  States they were sending her down in the bathtub to give her a bath around 6:30 PM tonight when the patient lost consciousness for approximately 10 minutes.  Denies any head injury, denies any fall, states the patient was seated and slumped down into the bathtub.  No history of syncopal events previously.  Husband denies any recent fever or cough.   Past Medical History:  Diagnosis Date  . A-fib ([place])   . Bowel trouble 2012   ocasionally  . 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 Pt then had a repeat excision on 11/16/2000  . Hypertension 2006  . Personal history of malignant neoplasm of breast 2002  . Solitary cyst of breast 2012  . Special screening for malignant neoplasms, colon     Patient Active Problem List   Diagnosis Date Noted  . CVA (cerebral vascular accident) ([place]) 12/04/2018  . Atrial fibrillation with RVR ([place]) 09/29/2018  . Pleural effusion 05/13/2017  . SOB (shortness of breath) 05/12/2017  . New onset atrial fibrillation ([place]) 02/09/2016  . Personal history of malignant neoplasm of breast   . Cancer ([place]) 10/04/2000    Past Surgical History:  Procedure Laterality Date  . BREAST BIOPSY Right 2002  . BREAST SURGERY Right 2002   wide excision and sn x4 done 10/29/00  with repeat excision done on 11/16/2000 with post operative radiation treatment and 5 years of tamoxifen with completion in 2007  . CHOLECYSTECTOMY  2002  . COLONOSCOPY  2008  . DILATION AND CURETTAGE OF UTERUS  1990,s  . ELECTROPHYSIOLOGIC STUDY N/A 03/16/2016   Procedure: CARDIOVERSION;  Surgeon: Isaias Cowman, MD;  Location: [facility] ORS;  Service: Cardiovascular;  Laterality: N/A;    Prior to Admission medications   Medication Sig Start Date End Date Taking? Authorizing Provider  apixaban (ELIQUIS) 5 MG TABS tablet Take 1 tablet (5 mg total) by mouth 2 (two) times daily. 10/01/18   Gladstone Lighter, MD  aspirin EC 81 MG EC tablet Take 1 tablet (81 mg total) by mouth daily. 12/10/18   Dustin Flock, MD  diphenhydramine-acetaminophen (TYLENOL PM) 25-500 MG TABS tablet Take 1-2 tablets by mouth at bedtime as needed (sleep/pain).    [provider]  lisinopril (ZESTRIL) 5 MG tablet Take 5 mg by mouth daily.    [provider]  rosuvastatin (CRESTOR) 10 MG tablet Take 1 tablet (10 mg total) by mouth daily. Patient not taking: Reported on 12/11/2018 12/09/18   Dustin Flock, MD    No Known Allergies  Family History  Problem Relation Age of Onset  . Cancer Other        breast, relationship not listed    Social History Social History   Tobacco Use  . Smoking status: Never Smoker  . Smokeless tobacco: Never Used  Substance Use Topics  . Alcohol use: No  .  Drug use: No    Review of Systems Unable to obtain an adequate/accurate review of systems secondary to confusion since CVA.  ____________________________________________   PHYSICAL EXAM:  VITAL SIGNS: ED Triage Vitals  Enc Vitals Group     BP 12/11/18 2031 (!) 151/110     Pulse Rate 12/11/18 2031 (!) 132     Resp 12/11/18 2031 16     Temp --      Temp Source 12/11/18 2031 Oral     SpO2 12/11/18 2031 97 %     Weight --      Height --      Head Circumference --      Peak Flow --      Pain Score  12/11/18 2033 0     Pain Loc --      Pain Edu? --      Excl. in [place]? --     Constitutional: Patient is awake and alert, no acute distress. Eyes: 1 to 2 mm pupils bilaterally. ENT      Head: Normocephalic and atraumatic.      Mouth/Throat: Mucous membranes are moist. Cardiovascular: Irregular rhythm rate around 130 bpm. Respiratory: Normal respiratory effort without tachypnea nor retractions. Breath sounds are clear Gastrointestinal: Soft and nontender. No distention. Musculoskeletal: Nontender with normal range of motion in all extremities.  Neurologic:  Normal speech and language.  Patient is able to move all extremities. Skin:  Skin is warm, dry and intact.  Psychiatric: Mood and affect are normal.   ____________________________________________    EKG  EKG viewed and interpreted by myself shows atrial fibrillation with rapid ventricular response at 132 bpm with a narrow QRS, left axis deviation, largely normal intervals with nonspecific ST changes slight ST depression in the lateral leads.  ____________________________________________    RADIOLOGY  Right middle lobe consolidation.  ____________________________________________   INITIAL IMPRESSION / ASSESSMENT AND PLAN / ED COURSE  Pertinent labs & imaging results that were available during my care of the patient were reviewed by me and considered in my medical decision making (see chart for details).   Patient presents to the emergency department for continued confusion, syncopal event tonight lasting approximately 10 minutes.  Patient appears to be in atrial fibrillation with rapid ventricular response, x-ray shows a right middle lobe pneumonia.  We will dose IV diltiazem, check blood cultures and dose antibiotics to cover for pneumonia which could explain the patient's tachycardia.  We will admit to the hospital service for continued work-up and treatment.    Norma Hill was evaluated in Emergency Department on  12/11/2018 for the symptoms described in the history of present illness. She was evaluated in the context of the global COVID-19 pandemic, which necessitated consideration that the patient might be at risk for infection with the SARS-CoV-2 virus that causes COVID-19. Institutional protocols and algorithms that pertain to the evaluation of patients at risk for COVID-19 are in a state of rapid change based on information released by regulatory bodies including the CDC and federal and state organizations. These policies and algorithms were followed during the patient's care in the ED.  CRITICAL CARE Performed by: Harvest Dark   Total critical care time: 30 minutes  Critical care time was exclusive of separately billable procedures and treating other patients.  Critical care was necessary to treat or prevent imminent or life-threatening deterioration.  Critical care was time spent personally by me on the following activities: development of treatment plan with patient and/or surrogate as well as  nursing, discussions with consultants, evaluation of patient's response to treatment, examination of patient, obtaining history from patient or surrogate, ordering and performing treatments and interventions, ordering and review of laboratory studies, ordering and review of radiographic studies, pulse oximetry and re-evaluation of patient's condition.   ____________________________________________   FINAL CLINICAL IMPRESSION(S) / ED DIAGNOSES  Pneumonia Atrial fibrillation with rapid ventricular response Syncope    Harvest Dark, MD 12/11/18 2217

## 2018-12-11 NOTE — Telephone Encounter (Signed)
Called to completed TCM call with patient, patients sister in law answered the phone- No ROI found in system, she requested a call back to speak with patients husband after lunch. Will call patients husband back at later time today.

## 2018-12-11 NOTE — ED Triage Notes (Addendum)
Pt to triage via wheelchair.  Husband states pt was discharged from [facility] 2 days ago with stroke.  Today, pt had a syncopal episode.  Pt denies any pain.  Decreased appetite.  Pt has a foley cath.   Pt pale.  Hx afib.  Pt on blood thinners.

## 2018-12-11 NOTE — H&P (Addendum)
[facility] NAME: Norma Hill    MR#:  [id number]  DATE OF BIRTH:  [birth year]  DATE OF ADMISSION:  12/11/2018  PRIMARY CARE PHYSICIAN: Guadalupe Maple, MD   REQUESTING/REFERRING PHYSICIAN: Harvest Dark, MD  CHIEF COMPLAINT:   Chief Complaint  Patient presents with  . Loss of Consciousness    HISTORY OF PRESENT ILLNESS:  Norma Hill  is a 73 y.o. Caucasian female with a known history of multiple medical problems that will be mentioned below, including atrial fibrillation, hypertension and breast cancer, presented to the emergency room with the onset of syncope that lasted about 10 minutes.  The patient was apparently seated in slumped into the bathtub but no injuries.  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  She has been getting improve muscle strength.  She was noted to be confused since her discharge.  She denies any chest pain or palpitations.  No nausea vomiting or abdominal pain.  No cough or wheezing during my abscesses.  No dyspnea orthopnea or paroxysmal nocturnal dyspnea.  She denies any headache or dizziness.  Upon presentation to the emergency room, blood pressure was elevated 151/110 with a pulse of 132 and atrial fibrillation approximately 97% on room air.  Labs revealed no significant abnormalities.  Her COVID-19 test came back negative.  Two-view chest x-ray revealed right middle lobe consolidation concerning for pneumonia with right pleural effusion.  EKG showed atrial fibrillation with rapid ventricular response of 132 and LVH as well as lateral T wave inversion.  Blood cultures were drawn.  The patient was given 10 mg of IV Cardizem, 1 L bolus of IV normal saline, IV Unasyn and Levaquin.  She will be admitted to a telemetry bed for further evaluation and management. PAST MEDICAL  HISTORY:   Past Medical History:  Diagnosis Date  . A-fib ([place])   . Bowel trouble 2012   ocasionally  . 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 Pt then had a repeat excision on 11/16/2000  . Hypertension 2006  . Personal history of malignant neoplasm of breast 2002  . Solitary cyst of breast 2012  . Special screening for malignant neoplasms, colon     PAST SURGICAL HISTORY:   Past Surgical History:  Procedure Laterality Date  . BREAST BIOPSY Right 2002  . BREAST SURGERY Right 2002   wide excision and sn x4 done 10/29/00 with repeat excision done on 11/16/2000 with post operative radiation treatment and 5 years of tamoxifen with completion in 2007  . CHOLECYSTECTOMY  2002  . COLONOSCOPY  2008  . DILATION AND CURETTAGE OF UTERUS  1990,s  . ELECTROPHYSIOLOGIC STUDY N/A 03/16/2016   Procedure: CARDIOVERSION;  Surgeon: Isaias Cowman, MD;  Location: [facility] ORS;  Service: Cardiovascular;  Laterality: N/A;    SOCIAL HISTORY:   Social History   Tobacco Use  . Smoking status: Never Smoker  . Smokeless tobacco: Never Used  Substance Use Topics  . Alcohol use: No    FAMILY HISTORY:   Family History  Problem Relation Age of Onset  . Cancer Other        breast, relationship not listed    DRUG ALLERGIES:  No Known Allergies  REVIEW OF SYSTEMS:   ROS As per history of present illness. All pertinent  systems were reviewed above. Constitutional,  HEENT, cardiovascular, respiratory, GI, GU, musculoskeletal, neuro, psychiatric, endocrine,  integumentary and hematologic systems were reviewed and are otherwise  negative/unremarkable except for positive findings mentioned above in the HPI.   MEDICATIONS AT HOME:   Prior to Admission medications   Medication Sig Start Date End Date Taking? Authorizing Provider  apixaban (ELIQUIS) 5 MG TABS tablet Take 1 tablet (5 mg total) by mouth 2 (two) times daily.  10/01/18   Gladstone Lighter, MD  aspirin EC 81 MG EC tablet Take 1 tablet (81 mg total) by mouth daily. 12/10/18   Dustin Flock, MD  diphenhydramine-acetaminophen (TYLENOL PM) 25-500 MG TABS tablet Take 1-2 tablets by mouth at bedtime as needed (sleep/pain).    [provider]  lisinopril (ZESTRIL) 5 MG tablet Take 5 mg by mouth daily.    [provider]  rosuvastatin (CRESTOR) 10 MG tablet Take 1 tablet (10 mg total) by mouth daily. Patient not taking: Reported on 12/11/2018 12/09/18   Dustin Flock, MD      VITAL SIGNS:  Blood pressure 125/68, pulse 83, resp. rate 17, SpO2 99 %.  PHYSICAL EXAMINATION:  Physical Exam  GENERAL:  73 y.o.-year-old Caucasian female patient lying in the bed with no acute distress.  EYES: Pupils equal, round, reactive to light and accommodation. No scleral icterus. Extraocular muscles intact.  HEENT: Head atraumatic, normocephalic. Oropharynx and nasopharynx clear.  NECK:  Supple, no jugular venous distention. No thyroid enlargement, no tenderness.  LUNGS: DIMINISHED BIBASILAR BREATH SOUNDS WITH RIGHT BASAL CRACKLES.  CARDIOVASCULAR: Regular rate and rhythm, S1, S2 normal. No murmurs, rubs, or gallops.  ABDOMEN: Soft, nondistended, nontender. Bowel sounds present. No organomegaly or mass.  EXTREMITIES: No pedal edema, cyanosis, or clubbing.  NEUROLOGIC: Cranial nerves II through XII are intact. Muscle strength 5/5 in all extremities. Sensation intact. Gait not checked.  PSYCHIATRIC: The patient is alert and oriented x 3.  Normal affect and good eye contact. SKIN: No obvious rash, lesion, or ulcer.   LABORATORY PANEL:   CBC Recent Labs  Lab 12/11/18 2050  WBC 7.9  HGB 14.6  HCT 46.5*  PLT 240   ------------------------------------------------------------------------------------------------------------------  Chemistries  Recent Labs  Lab 12/09/18 0410 12/11/18 2204  NA 139 140  K 3.7 3.8  CL 109 106  CO2 23 24  GLUCOSE  114* 130*  BUN 9 14  CREATININE 0.51 0.83  CALCIUM 8.6* 9.7  MG 1.9  --    ------------------------------------------------------------------------------------------------------------------  Cardiac Enzymes No results for input(s): TROPONINI in the last 168 hours. ------------------------------------------------------------------------------------------------------------------  RADIOLOGY:  Dg Chest 2 View  Result Date: 12/11/2018 CLINICAL DATA:  Syncopal episode EXAM: CHEST - 2 VIEW COMPARISON:  09/29/2018 FINDINGS: Cardiac shadow is enlarged but stable. Aortic calcifications are again seen. Left lung is clear. Small right pleural effusion is again noted but stable. Right middle lobe consolidation is noted. No bony abnormality is noted. IMPRESSION: Right middle lobe consolidation with associated effusion. Electronically Signed   By: Inez Catalina M.D.   On: 12/11/2018 21:13      IMPRESSION AND PLAN:   1. Right middle lobe pneumonia with right pleural effusion. The patient will be admitted to a medically monitored bed for community-acquired pneumonia and will be placed on IV Rocephin and Zithromax.  Mucolytic therapy will be obtained. Sputum Gram stain culture and sensitivity will be obtained.  2.  Syncope.  This could be related to atrial fibrillation with rapid ventricle response.   Will check orthostatics q 12 hours.  Will obtain a bilateral carotid Doppler and 2D echo.  The patient will be gently hydrated with IV normal saline and monitored for arrhythmias. Differential diagnosis would include neurally mediated syncope, cardiogenic, other arrhythmias related,  orthostatic hypotension and less likely hypoglycemia.  3.  Atrial fibrillation with rapid ventricular response.  Patient responded to 10 mg of IV Cardizem bolus will be monitored for the need for IV Cardizem drip.  We will continue Eliquis.  We will cycle cough enzymes and obtain a cardiology consultation and 2D echo.  Dr. Ubaldo Glassing was  notified about the patient.  We will continue the patient on p.o. Cardizem  4.  Hypertension.  We will continue Zestril.  5.  DVT prophylaxis.  We will continue Eliquis  All the records are reviewed and case discussed with ED provider. The plan of care was discussed in details with the patient (and family). I answered all questions. The patient agreed to proceed with the above mentioned plan. Further management will depend upon hospital course.   CODE STATUS: Full code  TOTAL TIME TAKING CARE OF THIS PATIENT: 50 minutes.    Christel Mormon M.D on 12/11/2018 at 11:37 PM  Pager - [phone]  After 6pm go to [email or web address] - Proofreader  [facility]  Office  [phone]  CC: Primary care physician; Guadalupe Maple, MD   Note: This dictation was prepared with Dragon dictation along with smaller phrase technology. Any transcriptional errors that result from this process are unintentional.

## 2018-12-11 NOTE — Telephone Encounter (Signed)
Transition Care Management Follow-up Telephone Call  Date of discharge and from where: 12/09/2018 [facility]   How have you been since you were released from the hospital? "shes not doing to well, but were managing ok right now"  Any questions or concerns? Yes , she still has her foley in and she isnt able to do anything on her own but my sister is a Marine scientist and she is helping since she lives very close."  Items Reviewed:  Did the pt receive and understand the discharge instructions provided? Yes   Medications obtained and verified? Yes   Any new allergies since your discharge? No   Dietary orders reviewed? Yes  Do you have support at home? Yes   Functional Questionnaire: (I = Independent and D = Dependent) ADLs: d  Bathing/Dressing- d  Meal Prep- d  Eating- d  Maintaining continence- d, has foley still   Transferring/Ambulation- d  Managing Meds- d   Follow up appointments reviewed:   PCP Hospital f/u appt confirmed? Yes  Scheduled to see Dr.Crissman on 12/12/2018 @ 10:45 am   [facility] f/u appt confirmed? Yes    Are transportation arrangements needed? No   If their condition worsens, is the pt aware to call PCP or go to the Emergency Dept.? Yes  Was the patient provided with contact information for the PCP's office or ED? Yes  Was to pt encouraged to call back with questions or concerns? Yes

## 2018-12-12 ENCOUNTER — Other Ambulatory Visit: Payer: Self-pay

## 2018-12-12 ENCOUNTER — Ambulatory Visit: Payer: Medicare Other | Admitting: Urology

## 2018-12-12 ENCOUNTER — Inpatient Hospital Stay: Payer: Medicare Other | Admitting: Family Medicine

## 2018-12-12 LAB — BASIC METABOLIC PANEL
Anion gap: 9 (ref 5–15)
BUN: 11 mg/dL (ref 8–23)
CO2: 23 mmol/L (ref 22–32)
Calcium: 8.6 mg/dL — ABNORMAL LOW (ref 8.9–10.3)
Chloride: 108 mmol/L (ref 98–111)
Creatinine, Ser: 0.52 mg/dL (ref 0.44–1.00)
GFR calc Af Amer: >60 mL/min (ref >60)
GFR calc non Af Amer: >60 mL/min (ref >60)
Glucose, Bld: 95 mg/dL (ref 70–99)
Potassium: 3.1 mmol/L — ABNORMAL LOW (ref 3.5–5.1)
Sodium: 140 mmol/L (ref 135–145)

## 2018-12-12 LAB — CBC
HCT: 37.7 % (ref 36.0–46.0)
Hemoglobin: 12.1 g/dL (ref 12.0–15.0)
MCH: 27.9 pg (ref 26.0–34.0)
MCHC: 32.1 g/dL (ref 30.0–36.0)
MCV: 86.9 fL (ref 80.0–100.0)
Platelets: 206 10*3/uL (ref 150–400)
RBC: 4.34 MIL/uL (ref 3.87–5.11)
RDW: 14.9 % (ref 11.5–15.5)
WBC: 6 10*3/uL (ref 4.0–10.5)
nRBC: 0 % (ref 0.0–0.2)

## 2018-12-12 LAB — TROPONIN I (HIGH SENSITIVITY)
Troponin I (High Sensitivity): 9 ng/L (ref <18)
Troponin I (High Sensitivity): 10 ng/L (ref <18)

## 2018-12-12 LAB — HIV ANTIBODY (ROUTINE TESTING W REFLEX): HIV Screen 4th Generation wRfx: NONREACTIVE

## 2018-12-12 LAB — PROCALCITONIN: Procalcitonin: <0.1 ng/mL

## 2018-12-12 LAB — STREP PNEUMONIAE URINARY ANTIGEN: Strep Pneumo Urinary Antigen: NEGATIVE

## 2018-12-12 LAB — MAGNESIUM: Magnesium: 1.8 mg/dL (ref 1.7–2.4)

## 2018-12-12 MED ORDER — CHLORHEXIDINE GLUCONATE CLOTH 2 % EX PADS
6.0000 | MEDICATED_PAD | Freq: Every day | CUTANEOUS | Status: DC
  Administered 2018-12-12 – 2018-12-19 (×6): 6 via TOPICAL

## 2018-12-12 MED ORDER — IPRATROPIUM-ALBUTEROL 0.5-2.5 (3) MG/3ML IN SOLN: 3.0000 mL | Freq: Four times a day (QID) | RESPIRATORY_TRACT | Status: DC | PRN

## 2018-12-12 MED ORDER — DILTIAZEM HCL 60 MG PO TABS
30.0000 mg | ORAL_TABLET | Freq: Four times a day (QID) | ORAL | Status: DC
  Administered 2018-12-12: 30 mg via ORAL
  Filled 2018-12-12 (×2): qty 1

## 2018-12-12 MED ORDER — MAGNESIUM SULFATE IN D5W 1-5 GM/100ML-% IV SOLN
1.0000 g | Freq: Once | INTRAVENOUS | Status: AC
  Administered 2018-12-12: 1 g via INTRAVENOUS
  Filled 2018-12-12: qty 100

## 2018-12-12 MED ORDER — DILTIAZEM HCL 30 MG PO TABS
60.0000 mg | ORAL_TABLET | Freq: Four times a day (QID) | ORAL | Status: DC
  Administered 2018-12-12 – 2018-12-13 (×4): 60 mg via ORAL
  Filled 2018-12-12 (×4): qty 2

## 2018-12-12 MED ORDER — POTASSIUM CHLORIDE 10 MEQ/100ML IV SOLN
10.0000 meq | INTRAVENOUS | Status: AC
  Administered 2018-12-12 (×4): 10 meq via INTRAVENOUS
  Filled 2018-12-12 (×4): qty 100

## 2018-12-12 MED ORDER — LEVOFLOXACIN IN D5W 500 MG/100ML IV SOLN
500.0000 mg | INTRAVENOUS | Status: DC
  Administered 2018-12-12: 17:00:00 500 mg via INTRAVENOUS
  Filled 2018-12-12 (×2): qty 100

## 2018-12-12 MED ORDER — DIPHENHYDRAMINE HCL 25 MG PO CAPS: 25.0000 mg | ORAL_CAPSULE | Freq: Every evening | ORAL | Status: DC | PRN

## 2018-12-12 MED ORDER — ADULT MULTIVITAMIN W/MINERALS CH
1.0000 | ORAL_TABLET | Freq: Every day | ORAL | Status: DC
  Administered 2018-12-13 – 2018-12-20 (×8): 1 via ORAL
  Filled 2018-12-12 (×8): qty 1

## 2018-12-12 MED ORDER — ENSURE ENLIVE PO LIQD
237.0000 mL | Freq: Two times a day (BID) | ORAL | Status: DC
  Administered 2018-12-12 – 2018-12-19 (×12): 237 mL via ORAL

## 2018-12-12 MED ORDER — ACETAMINOPHEN 500 MG PO TABS: 500.0000 mg | ORAL_TABLET | Freq: Every evening | ORAL | Status: DC | PRN

## 2018-12-12 NOTE — Plan of Care (Signed)

## 2018-12-12 NOTE — Care Management Important Message (Signed)
Important Message  Patient Details  Name: Norma Hill MRN: [id number] Date of Birth: 05/05/1945   Medicare Important Message Given:  Yes  Initial Medicare IM given by Patient Access Associate on 12/12/2018 at 1:03am.     Dannette Barbara 12/12/2018, 3:14 PM

## 2018-12-12 NOTE — Progress Notes (Signed)
Initial Nutrition Assessment  DOCUMENTATION CODES:   Not applicable  INTERVENTION:   Ensure Enlive po BID, each supplement provides 350 kcal and 20 grams of protein  MVI daily   2g sodium diet   NUTRITION DIAGNOSIS:   Inadequate oral intake related to acute illness as evidenced by per patient/family report.  GOAL:   Patient will meet greater than or equal to 90% of their needs  MONITOR:   PO intake, Labs, Weight trends, I & O's, Skin, Supplement acceptance  REASON FOR ASSESSMENT:   Malnutrition Screening Tool    ASSESSMENT:   73 y.o. female with a past medical history of atrial fibrillation, hypertension, on Eliquis, recent large CVA presents to the emergency department after syncopal event.  RD working remotely.  Pt recently admitted with CVA. Pt was eating 50-100% of meals prior to her discharge on 10/5. Suspect poor appetite and oral intake pta r/t SOB. Per chart, pt with 4lb(3%) weight loss over the past week; RD unsure how much weight loss could be contributed to dehydration. Pt does appear weight stable at baseline. RD will liberalize diet and add supplements to help pt meet her estimated needs.   Medications reviewed and include: aspirin, NaCl @100ml /hr, Mg sulfate   Labs reviewed: K 3.1(L), Mg 1.8 wnl  Unable to complete Nutrition-Focused physical exam at this time.   Diet Order:   Diet Order            Diet Heart Room service appropriate? Yes; Fluid consistency: Thin  Diet effective now             EDUCATION NEEDS:   No education needs have been identified at this time  Skin:  Skin Assessment: Reviewed RN Assessment(ecchymosis)  Last BM:  pta  Height:   Ht Readings from Last 1 Encounters:  12/12/18 5\' 5"  (1.651 m)    Weight:   Wt Readings from Last 1 Encounters:  12/12/18 64.8 kg    Ideal Body Weight:  56.8 kg  BMI:  Body mass index is 23.77 kg/m.  Estimated Nutritional Needs:   Kcal:  1400-1600kcal/day  Protein:   70-80g/day  Fluid:  >1.4L/day  Koleen Distance MS, RD, LDN Pager #- [phone] Office#- [phone] After Hours Pager: [phone]

## 2018-12-12 NOTE — Progress Notes (Signed)
Shift summary:  - New admit from home. RVR, S/P Fall.

## 2018-12-12 NOTE — Consult Note (Signed)
[facility] Cardiology  CARDIOLOGY CONSULT NOTE  Patient ID: Norma Hill MRN: [id number] DOB/AGE: 75-30-1947 75 y.o.  Admit date: 12/11/2018 Referring Physician [facility] Primary Physician [place] Primary Cardiologist [facility] Reason for Consultation atrial fibrillation  HPI: 73 year old female referred for evaluation of atrial fibrillation.  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  He presents after apparent couple episode, ECG revealing atrial fibrillation with rapid ventricular rate.  She has known history of paroxysmal atrial fibrillation, on Eliquis for stroke prevention.  Patient labs notable for normal high-sensitivity troponin of 10 and 9.  ECG reveals atrial fibrillation at a rate of 132 bpm.  Chest x-ray reveals evidence for right middle lobe pneumonia with right pleural effusion.  Upon questioning patient denies chest pain, shortness of breath, palpitations or heart racing.  Patient is confused, unable to give details regarding recent hospitalization for CVA.  Patient was given 10 mg IV bolus of Cardizem with improved heart rate and started on broad-spectrum antibiotic therapy for pneumonia.  Review of systems complete and found to be negative unless listed above     Past Medical History:  Diagnosis Date  . A-fib ([place])   . Bowel trouble 2012   ocasionally  . 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 Pt then had a repeat excision on 11/16/2000  . Hypertension 2006  . Personal history of malignant neoplasm of breast 2002  . Solitary cyst of breast 2012  . Special screening for malignant neoplasms, colon     Past Surgical History:  Procedure Laterality Date  . BREAST BIOPSY Right 2002  . BREAST SURGERY Right 2002   wide excision and sn x4 done 10/29/00 with repeat excision done on 11/16/2000 with post operative radiation treatment and 5 years of tamoxifen  with completion in 2007  . CHOLECYSTECTOMY  2002  . COLONOSCOPY  2008  . DILATION AND CURETTAGE OF UTERUS  1990,s  . ELECTROPHYSIOLOGIC STUDY N/A 03/16/2016   Procedure: CARDIOVERSION;  Surgeon: Isaias Cowman, MD;  Location: [facility] ORS;  Service: Cardiovascular;  Laterality: N/A;    (Not in a hospital admission)  Social History   Socioeconomic History  . Marital status: Married    Spouse name: Not on file  . Number of children: Not on file  . Years of education: Not on file  . Highest education level: Not on file  Occupational History  . Not on file  Social Needs  . Financial resource strain: Not hard at all  . Food insecurity    Worry: Never true    Inability: Never true  . Transportation needs    Medical: No    Non-medical: No  Tobacco Use  . Smoking status: Never Smoker  . Smokeless tobacco: Never Used  Substance and Sexual Activity  . Alcohol use: No  . Drug use: No  . Sexual activity: Not on file  Lifestyle  . Physical activity    Days per week: Not on file    Minutes per session: Not on file  . Stress: Not on file  Relationships  . Social Herbalist on phone: Not on file    Gets together: Not on file    Attends religious service: Not on file    Active member of club or organization: Not on file    Attends meetings of clubs or organizations: Not on file    Relationship status: Not on file  .  Intimate partner violence    Fear of current or ex partner: No    Emotionally abused: No    Physically abused: No    Forced sexual activity: No  Other Topics Concern  . Not on file  Social History Narrative  . Not on file    Family History  Problem Relation Age of Onset  . Cancer Other        breast, relationship not listed      Review of systems complete and found to be negative unless listed above      PHYSICAL EXAM  General: Well developed, well nourished, in no acute distress HEENT:  Normocephalic and atramatic Neck:  No JVD.  Lungs:  Clear bilaterally to auscultation and percussion. Heart: HRRR . Normal S1 and S2 without gallops or murmurs.  Abdomen: Bowel sounds are positive, abdomen soft and non-tender  Msk:  Back normal, normal gait. Normal strength and tone for age. Extremities: No clubbing, cyanosis or edema.   Neuro: Alert and oriented X 3. Psych:  Good affect, responds appropriately  Labs:   Lab Results  Component Value Date   WBC 6.0 12/12/2018   HGB 12.1 12/12/2018   HCT 37.7 12/12/2018   MCV 86.9 12/12/2018   PLT 206 12/12/2018    Recent Labs  Lab 12/12/18 0438  NA 140  K 3.1*  CL 108  CO2 23  BUN 11  CREATININE 0.52  CALCIUM 8.6*  GLUCOSE 95   Lab Results  Component Value Date   TROPONINI <0.03 05/12/2017    Lab Results  Component Value Date   CHOL 186 12/05/2018   CHOL 169 02/10/2016   Lab Results  Component Value Date   HDL 62 12/05/2018   HDL 59 02/10/2016   Lab Results  Component Value Date   LDLCALC 113 (H) 12/05/2018   LDLCALC 100 (H) 02/10/2016   Lab Results  Component Value Date   TRIG 54 12/05/2018   TRIG 50 02/10/2016   Lab Results  Component Value Date   CHOLHDL 3.0 12/05/2018   CHOLHDL 2.9 02/10/2016   No results found for: LDLDIRECT    Radiology: Dg Chest 2 View  Result Date: 12/11/2018 CLINICAL DATA:  Syncopal episode EXAM: CHEST - 2 VIEW COMPARISON:  09/29/2018 FINDINGS: Cardiac shadow is enlarged but stable. Aortic calcifications are again seen. Left lung is clear. Small right pleural effusion is again noted but stable. Right middle lobe consolidation is noted. No bony abnormality is noted. IMPRESSION: Right middle lobe consolidation with associated effusion. Electronically Signed   By: Inez Catalina M.D.   On: 12/11/2018 21:13   Ct Head Wo Contrast  Result Date: 12/08/2018 CLINICAL DATA:  73 year old female with altered mental status and confusion. Posterior right MCA territory infarct diagnosed on 12/04/2018. EXAM: CT HEAD WITHOUT CONTRAST TECHNIQUE:  Contiguous axial images were obtained from the base of the skull through the vertex without intravenous contrast. COMPARISON:  Brain MRI and intracranial MRA 12/04/2018 end earlier. FINDINGS: Brain: Patchy cytotoxic edema in the posterior right MCA territory appears stable. No associated hemorrhage or mass effect. Elsewhere Stable gray-white matter differentiation throughout the brain. No midline shift, ventriculomegaly, mass effect, evidence of mass lesion, or new cortically based infarct. Vascular: Mild Calcified atherosclerosis at the skull base. No suspicious intracranial vascular hyperdensity. Skull: No acute osseous abnormality identified. Sinuses/Orbits: Visualized paranasal sinuses and mastoids are stable and well pneumatized. Other: Visualized orbits and scalp soft tissues are within normal limits. IMPRESSION: 1. Stable CT appearance of the right  MCA territory infarct since 12/04/2018. No associated hemorrhage or mass effect. 2. No new intracranial abnormality. Electronically Signed   By: Genevie Ann M.D.   On: 12/08/2018 13:52   Ct Head Wo Contrast  Result Date: 12/04/2018 CLINICAL DATA:  Confusion EXAM: CT HEAD WITHOUT CONTRAST TECHNIQUE: Contiguous axial images were obtained from the base of the skull through the vertex without intravenous contrast. COMPARISON:  None. FINDINGS: Brain: Low-density changes within the right parieto-occipital region suggesting a acute to subacute infarct. No intracranial hemorrhage, mass, hydrocephalus, or midline shift. Small remote lacunar infarct in the left basal ganglia. Scattered low-density changes within the periventricular and subcortical white matter compatible with chronic microvascular ischemic change. Mild diffuse cerebral volume loss. Vascular: Mild atherosclerotic calcifications involving the large vessels of the skull base. No unexpected hyperdense vessel. Skull: Normal. Negative for fracture or focal lesion. Sinuses/Orbits: No acute finding. Other: None.  IMPRESSION: 1. 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 Follow-up studies with MRI, as clinically indicated. 2. No acute intracranial hemorrhage. 3. Microvascular ischemic changes and cerebral volume loss. These results were called by telephone at the time of interpretation on 12/04/2018 at 12:42 pm to provider [facility] , who verbally acknowledged these results. Electronically Signed   By: Davina Poke M.D.   On: 12/04/2018 12:45   Mr Angio Head Wo Contrast  Result Date: 12/04/2018 CLINICAL DATA:  Stroke, follow-up. Additional history provided: 73 year old female with history of atrial fibrillation on Eliquis, hypertension presents to ED for altered mental status/confusion. EXAM: MRI HEAD WITHOUT CONTRAST MRA HEAD WITHOUT CONTRAST TECHNIQUE: Multiplanar, multiecho pulse sequences of the brain and surrounding structures were obtained without intravenous contrast. Angiographic images of the head were obtained using MRA technique without contrast. COMPARISON:  None. Noncontrast head CT performed earlier the same day 12/04/2018. FINDINGS: MRI HEAD FINDINGS Brain: Multiple sequences are significantly motion degraded, limiting evaluation There is a moderate-size region of cortical/subcortical restricted diffusion within the right parietal lobe consistent with acute infarct. An additional small acute cortical/subcortical infarct is also present within the right occipital lobe. Associated T2/FLAIR hyperintensity at these sites. Mild regional mass effect without midline shift or significant effacement of the ventricular system. No evidence of intracranial mass. No extra-axial fluid collection. No chronic intracranial blood products. Moderate scattered and confluent T2/FLAIR hyperintensity within the cerebral white matter is nonspecific, but consistent with chronic small vessel ischemic disease. Mild generalized parenchymal atrophy.  Vascular: Reported separately Skull and upper cervical spine: Incompletely assessed cervical spondylosis. A C3-C4 posterior disc osteophyte results in at least mild spinal canal narrowing. Sinuses/Orbits: Visualized orbits demonstrate no acute abnormality. No significant paranasal sinus disease or mastoid effusion. MRA HEAD FINDINGS The examination is motion degraded, limiting evaluation for intracranial arterial stenoses and for small aneurysms. The bilateral intracranial internal carotid arteries are patent. There is an apparent high-grade narrowing of the petrous left ICA at the level of the skull base, although this may be at least partially related to artifact arising from the skull base. There are two focal stenoses within the cavernous left ICA, up to moderate in severity. No more than mild luminal narrowing of the intracranial right ICA. The right middle and anterior cerebral arteries are patent. There is apparent moderate stenosis of the distal M1 right middle cerebral artery and within an adjacent proximal inferior division M2 branch. Motion degradation limits evaluation of the M2 and more distal MCA branches. The left middle and anterior cerebral arteries are patent. Significant motion degradation  limits evaluation of the M2 and more distal left MCA branches. Apparent at least moderate proximal stenosis within the M2 superior division. No intracranial aneurysm is identified. The visualized intracranial vertebral arteries are patent without significant stenosis. The basilar artery is patent without significant stenosis. The bilateral posterior cerebral arteries are patent. No significant proximal stenosis within the right posterior cerebral artery. Moderate/severe focal stenosis within the P1 left posterior cerebral artery. Moderate focal stenosis within the P2 left posterior cerebral artery. There is also suggestion of significant atherosclerotic irregularity within the distal P2 and more distal left PCA  segments. Findings under the MRA impression will be called to the ordering clinician or representative by the Radiologist Assistant, and communication documented in the PACS or zVision Dashboard. IMPRESSION: MRI brain: 1. Moderate-sized acute cortical/subcortical right MCA territory parietal infarction. 2. Separate smaller acute cortical/subcortical infarct within the right occipital lobe. 3. Mild regional mass effect without midline shift or significant effacement of the ventricular system. 4. Generalized parenchymal atrophy with moderate chronic small vessel ischemic disease. 5. Incompletely assessed cervical spondylosis. MRA head: 1. Examination is significantly limited by motion artifact. 2. Multifocal intracranial arterial stenoses, presumably on the basis of atherosclerotic disease, most notably as follows. 3. Apparent moderate stenosis within the distal M1 right MCA and within an adjacent proximal inferior division M2 branch. 4. Apparent high-grade narrowing of the petrous left ICA at the level of the skull base. However, this stenosis may be overestimated due to artifact arising from the skull base. 5. Two focal stenosis within the cavernous left ICA, up to moderate in severity. 6. Apparent at least moderate stenosis within the proximal left M2 superior division. 7. Moderate/severe stenosis within the P1 left PCA. Moderate focal stenosis within the P2 left PCA. There is also suggestion of significant atherosclerotic irregularity within the distal P2 and more distal left PCA. Electronically Signed   By: Kellie Simmering   On: 12/04/2018 18:02   Mr Brain Wo Contrast  Result Date: 12/04/2018 CLINICAL DATA:  Stroke, follow-up. Additional history provided: 73 year old female with history of atrial fibrillation on Eliquis, hypertension presents to ED for altered mental status/confusion. EXAM: MRI HEAD WITHOUT CONTRAST MRA HEAD WITHOUT CONTRAST TECHNIQUE: Multiplanar, multiecho pulse sequences of the brain and  surrounding structures were obtained without intravenous contrast. Angiographic images of the head were obtained using MRA technique without contrast. COMPARISON:  None. Noncontrast head CT performed earlier the same day 12/04/2018. FINDINGS: MRI HEAD FINDINGS Brain: Multiple sequences are significantly motion degraded, limiting evaluation There is a moderate-size region of cortical/subcortical restricted diffusion within the right parietal lobe consistent with acute infarct. An additional small acute cortical/subcortical infarct is also present within the right occipital lobe. Associated T2/FLAIR hyperintensity at these sites. Mild regional mass effect without midline shift or significant effacement of the ventricular system. No evidence of intracranial mass. No extra-axial fluid collection. No chronic intracranial blood products. Moderate scattered and confluent T2/FLAIR hyperintensity within the cerebral white matter is nonspecific, but consistent with chronic small vessel ischemic disease. Mild generalized parenchymal atrophy. Vascular: Reported separately Skull and upper cervical spine: Incompletely assessed cervical spondylosis. A C3-C4 posterior disc osteophyte results in at least mild spinal canal narrowing. Sinuses/Orbits: Visualized orbits demonstrate no acute abnormality. No significant paranasal sinus disease or mastoid effusion. MRA HEAD FINDINGS The examination is motion degraded, limiting evaluation for intracranial arterial stenoses and for small aneurysms. The bilateral intracranial internal carotid arteries are patent. There is an apparent high-grade narrowing of the petrous left ICA at the level of  the skull base, although this may be at least partially related to artifact arising from the skull base. There are two focal stenoses within the cavernous left ICA, up to moderate in severity. No more than mild luminal narrowing of the intracranial right ICA. The right middle and anterior cerebral  arteries are patent. There is apparent moderate stenosis of the distal M1 right middle cerebral artery and within an adjacent proximal inferior division M2 branch. Motion degradation limits evaluation of the M2 and more distal MCA branches. The left middle and anterior cerebral arteries are patent. Significant motion degradation limits evaluation of the M2 and more distal left MCA branches. Apparent at least moderate proximal stenosis within the M2 superior division. No intracranial aneurysm is identified. The visualized intracranial vertebral arteries are patent without significant stenosis. The basilar artery is patent without significant stenosis. The bilateral posterior cerebral arteries are patent. No significant proximal stenosis within the right posterior cerebral artery. Moderate/severe focal stenosis within the P1 left posterior cerebral artery. Moderate focal stenosis within the P2 left posterior cerebral artery. There is also suggestion of significant atherosclerotic irregularity within the distal P2 and more distal left PCA segments. Findings under the MRA impression will be called to the ordering clinician or representative by the Radiologist Assistant, and communication documented in the PACS or zVision Dashboard. IMPRESSION: MRI brain: 1. Moderate-sized acute cortical/subcortical right MCA territory parietal infarction. 2. Separate smaller acute cortical/subcortical infarct within the right occipital lobe. 3. Mild regional mass effect without midline shift or significant effacement of the ventricular system. 4. Generalized parenchymal atrophy with moderate chronic small vessel ischemic disease. 5. Incompletely assessed cervical spondylosis. MRA head: 1. Examination is significantly limited by motion artifact. 2. Multifocal intracranial arterial stenoses, presumably on the basis of atherosclerotic disease, most notably as follows. 3. Apparent moderate stenosis within the distal M1 right MCA and within  an adjacent proximal inferior division M2 branch. 4. Apparent high-grade narrowing of the petrous left ICA at the level of the skull base. However, this stenosis may be overestimated due to artifact arising from the skull base. 5. Two focal stenosis within the cavernous left ICA, up to moderate in severity. 6. Apparent at least moderate stenosis within the proximal left M2 superior division. 7. Moderate/severe stenosis within the P1 left PCA. Moderate focal stenosis within the P2 left PCA. There is also suggestion of significant atherosclerotic irregularity within the distal P2 and more distal left PCA. Electronically Signed   By: Kellie Simmering   On: 12/04/2018 18:02   US Carotid Bilateral (at [facility] And [facility] Only)  Result Date: 12/05/2018 CLINICAL DATA:  73 year old female with a history of cerebrovascular accident EXAM: BILATERAL CAROTID DUPLEX ULTRASOUND TECHNIQUE: Pearline Cables scale imaging, color Doppler and duplex ultrasound were performed of bilateral carotid and vertebral arteries in the neck. COMPARISON:  None. FINDINGS: Criteria: Quantification of carotid stenosis is based on velocity parameters that correlate the residual internal carotid diameter with NASCET-based stenosis levels, using the diameter of the distal internal carotid lumen as the denominator for stenosis measurement. The following velocity measurements were obtained: RIGHT ICA:  Systolic 65 cm/sec, Diastolic 16 cm/sec CCA:  82 cm/sec SYSTOLIC ICA/CCA RATIO:  0.8 ECA:  133 cm/sec LEFT ICA:  Systolic 80 cm/sec, Diastolic 14 cm/sec CCA:  70 cm/sec SYSTOLIC ICA/CCA RATIO:  1.1 ECA:  66 cm/sec Right Brachial SBP: Not acquired Left Brachial SBP: Not acquired RIGHT CAROTID ARTERY: No significant calcifications of the right common carotid artery. Intermediate waveform maintained. Heterogeneous and partially calcified plaque  at the right carotid bifurcation. No significant lumen shadowing. Low resistance waveform of the right ICA. No significant tortuosity.  RIGHT VERTEBRAL ARTERY: Antegrade flow with low resistance waveform. LEFT CAROTID ARTERY: No significant calcifications of the left common carotid artery. Intermediate waveform maintained. Heterogeneous and partially calcified plaque at the left carotid bifurcation without significant lumen shadowing. Low resistance waveform of the left ICA. No significant tortuosity. LEFT VERTEBRAL ARTERY:  Antegrade flow with low resistance waveform. IMPRESSION: Color duplex indicates minimal heterogeneous plaque, with no hemodynamically significant stenosis by duplex criteria in the extracranial cerebrovascular circulation. Signed, Dulcy Fanny. Dellia Nims, RPVI [facility] Radiology Electronically Signed   By: Corrie Mckusick D.O.   On: 12/05/2018 08:05    EKG: atrial fibrillation at 132 bpm  ASSESSMENT AND PLAN:   1.  Atrial fibrillation with rapid ventricular rate, in the setting of right middle lobe pneumonia, likely compensatory, asymptomatic 2.  Syncope, likely multifactorial, in the setting of atrial fibrillation with rapid ventricular rate, with right middle lobe pneumonia 3.  Right middle lobe pneumonia 4.  Recent right MCA infarction with residual left-sided weakness and confusion  Recommendations  1.  Continue current medications 2.  Continue Eliquis for stroke prevention 3.  Uptitrate Cardizem to 60 mg p.o. every 6 hours 4.  Review 2D echocardiogram  Signed: Isaias Cowman MD,PhD, [facility] 12/12/2018, 8:29 AM

## 2018-12-12 NOTE — Progress Notes (Addendum)
[place] [place] NAME: Shakiara Wormser    MR#:  [id number]  DATE OF BIRTH:  03/03/1946  SUBJECTIVE:   Patient states she is feeling okay this morning.  She endorses mild shortness of breath.  She denies any cough, fevers, or chills.  She denies any chest pain or palpitations.  REVIEW OF SYSTEMS:  Review of Systems  Constitutional: Negative for chills and fever.  HENT: Negative for congestion and sore throat.   Eyes: Negative for blurred vision and double vision.  Respiratory: Positive for shortness of breath. Negative for cough.   Cardiovascular: Negative for chest pain, palpitations and leg swelling.  Gastrointestinal: Negative for nausea and vomiting.  Genitourinary: Negative for dysuria and urgency.  Musculoskeletal: Negative for back pain and neck pain.  Neurological: Negative for dizziness and headaches.  Psychiatric/Behavioral: Negative for depression. The patient is not nervous/anxious.     DRUG ALLERGIES:  No Known Allergies VITALS:  Blood pressure (!) 156/80, pulse (!) 114, temperature 98 F (36.7 C), temperature source Oral, resp. rate 20, height 5\' 5"  (1.651 m), weight 64.8 kg, SpO2 97 %. PHYSICAL EXAMINATION:  Physical Exam  GENERAL:  Laying in the bed with no acute distress.  HEENT: Head atraumatic, normocephalic. Pupils equal, round, reactive to light and accommodation. No scleral icterus. Extraocular muscles intact. Oropharynx and nasopharynx clear.  NECK:  Supple, no jugular venous distention. No thyroid enlargement. LUNGS: + Crackles present in the right lung base. No use of accessory muscles of respiration.  CARDIOVASCULAR: Irregularly irregular rhythm, regular rate, S1, S2 normal. No murmurs, rubs, or gallops.  ABDOMEN: Soft, nontender, nondistended. Bowel sounds present.  EXTREMITIES: No pedal edema, cyanosis, or clubbing.  NEUROLOGIC: CN 2-12 intact, no focal deficits. 5/5 muscle strength throughout all  extremities. Sensation intact throughout. Gait not checked.  PSYCHIATRIC: The patient is alert and oriented x 3.  SKIN: No obvious rash, lesion, or ulcer.  LABORATORY PANEL:  Female CBC Recent Labs  Lab 12/12/18 0438  WBC 6.0  HGB 12.1  HCT 37.7  PLT 206   ------------------------------------------------------------------------------------------------------------------ Chemistries  Recent Labs  Lab 12/12/18 0438  NA 140  K 3.1*  CL 108  CO2 23  GLUCOSE 95  BUN 11  CREATININE 0.52  CALCIUM 8.6*  MG 1.8   RADIOLOGY:  Dg Chest 2 View  Result Date: 12/11/2018 CLINICAL DATA:  Syncopal episode EXAM: CHEST - 2 VIEW COMPARISON:  09/29/2018 FINDINGS: Cardiac shadow is enlarged but stable. Aortic calcifications are again seen. Left lung is clear. Small right pleural effusion is again noted but stable. Right middle lobe consolidation is noted. No bony abnormality is noted. IMPRESSION: Right middle lobe consolidation with associated effusion. Electronically Signed   By: Inez Catalina M.D.   On: 12/11/2018 21:13   ASSESSMENT AND PLAN:   Right middle lobe pneumonia with small right pleural effusion- due to HCAP vs aspiration pneumonia. Patient was not meeting sepsis criteria on admission. -Change from ceftriaxone and azithromycin to Levaquin, as this HCAP given patient's recent admission -Check procalcitonin- if negative, consider changing to unasyn for possible aspiration pneumonia -Duonebs prn  Paroxysmal atrial fibrillation with RVR- heart rates remain mildly elevated this morning -Increase Cardizem dose to 60 mg p.o. every 6 hours -Will cancel ECHO order, as patient just had this done 1 week ago -Continue home eliquis -Cardiology following -Cardiac monitoring  Syncope- likely due to above -Cancel echo and carotid Doppler orders, as patient just had this done 1 week ago -  Cardiac monitoring  Recent large right MCA territory stroke-patient was just admitted from 9/30-10/5  -Continue aspirin and Eliquis  Hypokalemia -Replete and recheck -Will give 1g IV magnesium  Urinary retention-noted at last admission -Will continue Foley -Patient will need to follow-up with urology on discharge for voiding trial  Hypertension- BP mildly elevated today -Continue home lisinopril  Hyperlipidemia-stable -Continue home Crestor  All the records are reviewed and case discussed with Care Management/Social Worker. Management plans discussed with the patient, family and they are in agreement.  CODE STATUS: Full Code  TOTAL TIME TAKING CARE OF THIS PATIENT: 40 minutes.   More than 50% of the time was spent in counseling/coordination of care: YES  POSSIBLE D/C IN 1-2 DAYS, DEPENDING ON CLINICAL CONDITION.   Berna Spare Mayo M.D on 12/12/2018 at 1:16 PM  Between 7am to 6pm - Pager - [phone]  After 6pm go to [email or web address] - Proofreader  [employer]  Hospitalists  Office  [phone]  CC: Primary care physician; Guadalupe Maple, MD  Note: This dictation was prepared with Dragon dictation along with smaller phrase technology. Any transcriptional errors that result from this process are unintentional.

## 2018-12-12 NOTE — ED Notes (Addendum)
ED TO INPATIENT HANDOFF REPORT  ED Nurse Name and Phone #: Daiva Nakayama  RN [phone]  S Name/Age/Gender Norma Hill 73 y.o. female Room/Bed: ED16A/ED16A  Code Status   Code Status: Full Code  Home/SNF/Other Home Patient oriented to: self, place, time and situation Is this baseline? Yes   Triage Complete: Triage complete  Chief Complaint EMS  Triage Note Pt to triage via wheelchair.  Husband states pt was discharged from [facility] 2 days ago with stroke.  Today, pt had a syncopal episode.  Pt denies any pain.  Decreased appetite.  Pt has a foley cath.   Pt pale.  Hx afib.  Pt on blood thinners.    Allergies No Known Allergies  Level of Care/Admitting Diagnosis ED Disposition    ED Disposition Condition [facility] Area: [place] [[id number]]  Level of Care: Telemetry [5]  Covid Evaluation: Confirmed COVID Negative  Diagnosis: Pneumonia [id number]  Admitting Physician: Christel Mormon [id number]  Attending Physician: Christel Mormon [id number]  Estimated length of stay: past midnight tomorrow  Certification:: I certify this patient will need inpatient services for at least 2 midnights  PT Class (Do Not Modify): Inpatient [101]  PT Acc Code (Do Not Modify): Private [1]       B Medical/Surgery History Past Medical History:  Diagnosis Date  . A-fib ([place])   . Bowel trouble 2012   ocasionally  . 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 Pt then had a repeat excision on 11/16/2000  . Hypertension 2006  . Personal history of malignant neoplasm of breast 2002  . Solitary cyst of breast 2012  . Special screening for malignant neoplasms, colon    Past Surgical History:  Procedure Laterality Date  . BREAST BIOPSY Right 2002  . BREAST SURGERY Right 2002   wide excision and sn x4 done 10/29/00 with repeat excision done on 11/16/2000 with post operative radiation treatment and 5  years of tamoxifen with completion in 2007  . CHOLECYSTECTOMY  2002  . COLONOSCOPY  2008  . DILATION AND CURETTAGE OF UTERUS  1990,s  . ELECTROPHYSIOLOGIC STUDY N/A 03/16/2016   Procedure: CARDIOVERSION;  Surgeon: Isaias Cowman, MD;  Location: [facility] ORS;  Service: Cardiovascular;  Laterality: N/A;     A IV Location/Drains/Wounds Patient Lines/Drains/Airways Status   Active Line/Drains/Airways    Name:   Placement date:   Placement time:   Site:   Days:   Peripheral IV 12/11/18 Right Hand   12/11/18    2051    Hand   1   Peripheral IV 12/11/18 Right Forearm   12/11/18    2211    Forearm   1   Peripheral IV 12/11/18 Right Antecubital   12/11/18    2211    Antecubital   1   Urethral Catheter Levada Schilling, RN 14 Fr.   12/07/18    1730    -   5          Intake/Output Last 24 hours  Intake/Output Summary (Last 24 hours) at 12/12/2018 0007 Last data filed at 12/11/2018 2308 Gross per 24 hour  Intake 100 ml  Output -  Net 100 ml    Labs/Imaging Results for orders placed or performed during the hospital encounter of 12/11/18 (from the past 48 hour(s))  CBC     Status: Abnormal   Collection Time: 12/11/18  8:50 PM  Result Value  Ref Range   WBC 7.9 4.0 - 10.5 K/uL   RBC 5.24 (H) 3.87 - 5.11 MIL/uL   Hemoglobin 14.6 12.0 - 15.0 g/dL   HCT 46.5 (H) 36.0 - 46.0 %   MCV 88.7 80.0 - 100.0 fL   MCH 27.9 26.0 - 34.0 pg   MCHC 31.4 30.0 - 36.0 g/dL   RDW 15.5 11.5 - 15.5 %   Platelets 240 150 - 400 K/uL   nRBC 0.0 0.0 - 0.2 %    Comment: Performed at [facility], [address] [place], [place] [address]  Lactic acid, plasma     Status: None   Collection Time: 12/11/18  8:50 PM  Result Value Ref Range   Lactic Acid, Venous 1.7 0.5 - 1.9 mmol/L    Comment: Performed at [facility], [address] [place], [place] [id number]  Basic metabolic panel     Status: Abnormal   Collection Time: 12/11/18 10:04 PM  Result Value Ref Range   Sodium 140 135 - 145  mmol/L   Potassium 3.8 3.5 - 5.1 mmol/L   Chloride 106 98 - 111 mmol/L   CO2 24 22 - 32 mmol/L   Glucose, Bld 130 (H) 70 - 99 mg/dL   BUN 14 8 - 23 mg/dL   Creatinine, Ser 0.83 0.44 - 1.00 mg/dL   Calcium 9.7 8.9 - 10.3 mg/dL   GFR calc non Af Amer >60 >60 mL/min   GFR calc Af Amer >60 >60 mL/min   Anion gap 10 5 - 15    Comment: Performed at [facility], [place], [place] [address]  Troponin I (High Sensitivity)     Status: None   Collection Time: 12/11/18 10:04 PM  Result Value Ref Range   Troponin I (High Sensitivity) 10 <18 ng/L    Comment: (NOTE) Elevated high sensitivity troponin I (hsTnI) values and significant  changes across serial measurements may suggest ACS but many other  chronic and acute conditions are known to elevate hsTnI results.  Refer to the "Links" section for chest pain algorithms and additional  guidance. Performed at [facility], [address] [place],  [address]   SARS Coronavirus 2 [facility] order, Performed in [facility] hospital lab) Nasopharyngeal Nasopharyngeal Swab     Status: None   Collection Time: 12/11/18 10:04 PM   Specimen: Nasopharyngeal Swab  Result Value Ref Range   SARS Coronavirus 2 NEGATIVE NEGATIVE    Comment: (NOTE) If result is NEGATIVE SARS-CoV-2 target nucleic acids are NOT DETECTED. The SARS-CoV-2 RNA is generally detectable in upper and lower  respiratory specimens during the acute phase of infection. The lowest  concentration of SARS-CoV-2 viral copies this assay can detect is 250  copies / mL. A negative result does not preclude SARS-CoV-2 infection  and should not be used as the sole basis for treatment or other  patient management decisions.  A negative result may occur with  improper specimen collection / handling, submission of specimen other  than nasopharyngeal swab, presence of viral mutation(s) within the  areas targeted by this assay, and inadequate number of viral  copies  (<250 copies / mL). A negative result must be combined with clinical  observations, patient history, and epidemiological information. If result is POSITIVE SARS-CoV-2 target nucleic acids are DETECTED. The SARS-CoV-2 RNA is generally detectable in upper and lower  respiratory specimens dur ing the acute phase of infection.  Positive  results are indicative of active infection  with SARS-CoV-2.  Clinical  correlation with patient history and other diagnostic information is  necessary to determine patient infection status.  Positive results do  not rule out bacterial infection or co-infection with other viruses. If result is PRESUMPTIVE POSTIVE SARS-CoV-2 nucleic acids MAY BE PRESENT.   A presumptive positive result was obtained on the submitted specimen  and confirmed on repeat testing.  While 2019 novel coronavirus  (SARS-CoV-2) nucleic acids may be present in the submitted sample  additional confirmatory testing may be necessary for epidemiological  and / or clinical management purposes  to differentiate between  SARS-CoV-2 and other Sarbecovirus currently known to infect humans.  If clinically indicated additional testing with an alternate test  methodology [phone]) is advised. The SARS-CoV-2 RNA is generally  detectable in upper and lower respiratory sp ecimens during the acute  phase of infection. The expected result is Negative. Fact Sheet for Patients:  StrictlyIdeas.no Fact Sheet for Healthcare Providers: BankingDealers.co.za This test is not yet approved or cleared by the [place] FDA and has been authorized for detection and/or diagnosis of SARS-CoV-2 by FDA under an Emergency Use Authorization (EUA).  This EUA will remain in effect (meaning this test can be used) for the duration of the COVID-19 declaration under Section 564(b)(1) of the Act, 21 U.S.C. section 360bbb-3(b)(1), unless the authorization is terminated  or revoked sooner. Performed at [facility], [address] [place], [place] [address]    Dg Chest 2 View  Result Date: 12/11/2018 CLINICAL DATA:  Syncopal episode EXAM: CHEST - 2 VIEW COMPARISON:  09/29/2018 FINDINGS: Cardiac shadow is enlarged but stable. Aortic calcifications are again seen. Left lung is clear. Small right pleural effusion is again noted but stable. Right middle lobe consolidation is noted. No bony abnormality is noted. IMPRESSION: Right middle lobe consolidation with associated effusion. Electronically Signed   By: Inez Catalina M.D.   On: 12/11/2018 21:13    Pending Labs Unresulted Labs (From admission, onward)    Start     Ordered   12/12/18 [id number]  Basic metabolic panel  Tomorrow morning,   STAT     12/11/18 2336   12/12/18 0500  CBC  Tomorrow morning,   STAT     12/11/18 2336   12/11/18 2337  HIV Antibody (routine testing w rflx)  (HIV Antibody (Routine testing w reflex) panel)  Once,   STAT     12/11/18 2336   12/11/18 2337  HIV4GL Save Tube  (HIV Antibody (Routine testing w reflex) panel)  Once,   STAT     12/11/18 2336   12/11/18 2337  Legionella Pneumophila Serogp 1 Ur Ag  Once,   STAT     12/11/18 2336   12/11/18 2337  Strep pneumoniae urinary antigen  Once,   STAT     12/11/18 2336   12/11/18 2337  Culture, sputum-assessment  Once,   STAT     12/11/18 2336   12/11/18 2157  Blood culture (routine x 2)  BLOOD CULTURE X 2,   STAT     12/11/18 2156          Vitals/Pain Today's Vitals   12/11/18 2031 12/11/18 2033 12/11/18 2208 12/11/18 2257  BP: (!) 151/110  (!) 135/91 125/68  Pulse: (!) 132  (!) 106 83  Resp: 16  20 17   TempSrc: Oral     SpO2: 97%  95% 99%  PainSc:  0-No pain      Isolation Precautions No active isolations  Medications Medications  sodium chloride flush (NS) 0.9 % injection 3 mL (has no administration in time range)  levofloxacin (LEVAQUIN) IVPB 750 mg (750 mg Intravenous New Bag/Given 12/11/18 2257)  aspirin EC  tablet 81 mg (has no administration in time range)  lisinopril (ZESTRIL) tablet 5 mg (has no administration in time range)  rosuvastatin (CRESTOR) tablet 10 mg (has no administration in time range)  diphenhydramine-acetaminophen (TYLENOL PM) 25-500 MG per tablet 1-2 tablet (has no administration in time range)  apixaban (ELIQUIS) tablet 5 mg (has no administration in time range)  0.9 %  sodium chloride infusion (has no administration in time range)  acetaminophen (TYLENOL) tablet 650 mg (has no administration in time range)    Or  acetaminophen (TYLENOL) suppository 650 mg (has no administration in time range)  traZODone (DESYREL) tablet 25 mg (has no administration in time range)  magnesium hydroxide (MILK OF MAGNESIA) suspension 30 mL (has no administration in time range)  ondansetron (ZOFRAN) tablet 4 mg (has no administration in time range)    Or  ondansetron (ZOFRAN) injection 4 mg (has no administration in time range)  cefTRIAXone (ROCEPHIN) 2 g in sodium chloride 0.9 % 100 mL IVPB (has no administration in time range)  azithromycin (ZITHROMAX) 500 mg in sodium chloride 0.9 % 250 mL IVPB (has no administration in time range)  guaiFENesin (MUCINEX) 12 hr tablet 600 mg (has no administration in time range)  sodium chloride 0.9 % bolus 1,000 mL (1,000 mLs Intravenous New Bag/Given 12/11/18 2226)  diltiazem (CARDIZEM) injection 10 mg (10 mg Intravenous Given 12/11/18 2227)  Ampicillin-Sulbactam (UNASYN) 3 g in sodium chloride 0.9 % 100 mL IVPB (0 g Intravenous Stopped 12/11/18 2308)    Mobility manual wheelchair Moderate fall risk   Focused Assessments    R Recommendations: See Admitting Provider Note  Report given to:   Additional Notes:  Pt reports she walks at home and does not normally require a wheelchair.

## 2018-12-13 DIAGNOSIS — I4891 Unspecified atrial fibrillation: Secondary | ICD-10-CM

## 2018-12-13 DIAGNOSIS — Z8673 Personal history of transient ischemic attack (TIA), and cerebral infarction without residual deficits: Secondary | ICD-10-CM

## 2018-12-13 DIAGNOSIS — I420 Dilated cardiomyopathy: Secondary | ICD-10-CM

## 2018-12-13 DIAGNOSIS — R55 Syncope and collapse: Secondary | ICD-10-CM

## 2018-12-13 DIAGNOSIS — R410 Disorientation, unspecified: Secondary | ICD-10-CM

## 2018-12-13 LAB — CBC
HCT: 37.4 % (ref 36.0–46.0)
Hemoglobin: 11.9 g/dL — ABNORMAL LOW (ref 12.0–15.0)
MCH: 28 pg (ref 26.0–34.0)
MCHC: 31.8 g/dL (ref 30.0–36.0)
MCV: 88 fL (ref 80.0–100.0)
Platelets: 201 10*3/uL (ref 150–400)
RBC: 4.25 MIL/uL (ref 3.87–5.11)
RDW: 15.2 % (ref 11.5–15.5)
WBC: 5.7 10*3/uL (ref 4.0–10.5)
nRBC: 0 % (ref 0.0–0.2)

## 2018-12-13 LAB — BASIC METABOLIC PANEL
Anion gap: 3 — ABNORMAL LOW (ref 5–15)
BUN: 12 mg/dL (ref 8–23)
CO2: 26 mmol/L (ref 22–32)
Calcium: 8.7 mg/dL — ABNORMAL LOW (ref 8.9–10.3)
Chloride: 111 mmol/L (ref 98–111)
Creatinine, Ser: 0.52 mg/dL (ref 0.44–1.00)
GFR calc Af Amer: >60 mL/min (ref >60)
GFR calc non Af Amer: >60 mL/min (ref >60)
Glucose, Bld: 103 mg/dL — ABNORMAL HIGH (ref 70–99)
Potassium: 3.8 mmol/L (ref 3.5–5.1)
Sodium: 140 mmol/L (ref 135–145)

## 2018-12-13 LAB — LEGIONELLA PNEUMOPHILA SEROGP 1 UR AG: L. pneumophila Serogp 1 Ur Ag: NEGATIVE

## 2018-12-13 MED ORDER — DILTIAZEM HCL ER COATED BEADS 120 MG PO CP24
120.0000 mg | ORAL_CAPSULE | Freq: Every day | ORAL | Status: DC
  Administered 2018-12-13 – 2018-12-14 (×2): 120 mg via ORAL
  Filled 2018-12-13 (×2): qty 1

## 2018-12-13 MED ORDER — SODIUM CHLORIDE 0.9% FLUSH
10.0000 mL | Freq: Two times a day (BID) | INTRAVENOUS | Status: DC
  Administered 2018-12-13 – 2018-12-20 (×14): 10 mL via INTRAVENOUS

## 2018-12-13 MED ORDER — INFLUENZA VAC A&B SA ADJ QUAD 0.5 ML IM PRSY
0.5000 mL | PREFILLED_SYRINGE | INTRAMUSCULAR | Status: DC
  Filled 2018-12-13: qty 0.5

## 2018-12-13 NOTE — TOC Initial Note (Signed)
Transition of Care [facility]) - Initial/Assessment Note    Patient Details  Name: Norma Hill MRN: [id number] Date of Birth: Dec 06, 1945  [facility] CM/SW Contact:    Ross Ludwig, LCSW Phone Number: 12/13/2018, 5:43 PM  Clinical Narrative:                  CSW spoke with patient, she is alert and oriented x4 and lives with her husband.  Patient states she was just recently discharged from the hospital and was readmitted again, due to "passing out."  Patient expressed that she is hopeful that she can return home soon.  CSW explained role of social worker, and process of getting home health set up.  Patient stated she would like home health services.  CSW informed her that [facility] can provide home health PT, and then add nursing later if she needs it.  Patient stated she would just like home health PT right now.  Patient did not express any other concerns or issues, she is hopeful that she will be able to discharge soon.  Expected Discharge Plan: [place] Barriers to Discharge: Continued Medical Work up   Patient Goals and CMS Choice Patient states their goals for this hospitalization and ongoing recovery are:: To return back home with husband. CMS Medicare.gov Compare Post Acute Care list provided to:: Patient Choice offered to / list presented to : Patient  Expected Discharge Plan and Services Expected Discharge Plan: [place] Choice: [place] arrangements for the past 2 months: Single Family Home                 DME Arranged: N/A DME Agency: NA       HH Arranged: PT HH Agency: [place] Date [place]: 12/13/18 Time [place]: [id number] Representative spoke with at [place]: Malachy Mood  Prior Living Arrangements/Services Living arrangements for the past 2 months: [place] Lives with:: Spouse Patient language and need for interpreter  reviewed:: Yes Do you feel safe going back to the place where you live?: Yes      Need for Family Participation in Patient Care: No (Comment) Care giver support system in place?: No (comment)   Criminal Activity/Legal Involvement Pertinent to Current Situation/Hospitalization: No - Comment as needed  Activities of Daily Living Home Assistive Devices/Equipment: None ADL Screening (condition at time of admission) Patient's cognitive ability adequate to safely complete daily activities?: Yes Is the patient deaf or have difficulty hearing?: No Does the patient have difficulty seeing, even when wearing glasses/contacts?: No Does the patient have difficulty concentrating, remembering, or making decisions?: No Patient able to express need for assistance with ADLs?: Yes Does the patient have difficulty dressing or bathing?: No Independently performs ADLs?: Yes (appropriate for developmental age) Does the patient have difficulty walking or climbing stairs?: No Weakness of Legs: None Weakness of Arms/Hands: None  Permission Sought/Granted Permission sought to share information with : Case Manager Permission granted to share information with : Yes, Verbal Permission Granted  Share Information with NAMERAYMONDA, CHELIUS [id number]  [phone]  Permission granted to share info w AGENCY: [place]        Emotional Assessment Appearance:: Appears younger than stated age Attitude/Demeanor/Rapport: Engaged Affect (typically observed): Accepting, Appropriate, Calm, Pleasant, Stable Orientation: : Oriented to Self, Oriented to Place, Oriented to  Time, Oriented to Situation Alcohol / Substance Use: Not  Applicable Psych Involvement: No (comment)  Admission diagnosis:  Syncope [R55] Atrial fibrillation with rapid ventricular response (HCC) [I48.91] Syncope, unspecified syncope type [R55] Community acquired pneumonia of right middle lobe of lung [J18.9] Patient Active  Problem List   Diagnosis Date Noted  . Pneumonia 12/11/2018  . CVA (cerebral vascular accident) ([place]) 12/04/2018  . Atrial fibrillation with RVR ([place]) 09/29/2018  . Pleural effusion 05/13/2017  . SOB (shortness of breath) 05/12/2017  . New onset atrial fibrillation ([place]) 02/09/2016  . Personal history of malignant neoplasm of breast   . Cancer ([place]) 10/04/2000   PCP:  Guadalupe Maple, MD Pharmacy:   [place], [place] - [address] [place] [address] Phone: [phone] Fax: [phone]     Social Determinants of Health (SDOH) Interventions    Readmission Risk Interventions No flowsheet data found.

## 2018-12-13 NOTE — Progress Notes (Signed)
OT Cancellation Note  Patient Details Name: Norma Hill MRN: [id number] DOB: [birth year]   Cancelled Treatment:    Reason Eval/Treat Not Completed: Other (comment). Consult received, chart reviewed. Pt and spouse meeting with chaplain upon attempt. Will re-attempt OT evaluation at later date/time as pt is appropriate and available.   Jeni Salles, MPH, MS, OTR/L ascom [phone] 12/13/18, 3:10 PM

## 2018-12-13 NOTE — Progress Notes (Signed)
Progress Note  Patient Name: Norma Hill Date of Encounter: 12/13/2018  Primary Cardiologist: [facility]  Subjective   Patient preferred [facility] assume care during admission. She was consulted on initially by [facility] on 12/12/2018.   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 She was noted to be in Afib with RVR with rates into the 130s bpm. In was recommended she continue rate control with Cardizem and continue Eliquis.   She feels well this morning. She does not recall the events surrounding her syncopal episode. No residual left-sided weakness.   Inpatient Medications    Scheduled Meds: . apixaban  5 mg Oral BID  . aspirin EC  81 mg Oral Daily  . Chlorhexidine Gluconate Cloth  6 each Topical Daily  . diltiazem  60 mg Oral Q6H  . feeding supplement (ENSURE ENLIVE)  237 mL Oral BID BM  . guaiFENesin  600 mg Oral BID  . lisinopril  5 mg Oral Daily  . multivitamin with minerals  1 tablet Oral Daily  . rosuvastatin  10 mg Oral Daily   Continuous Infusions: . sodium chloride 100 mL/hr at 12/13/18 0317  . levofloxacin (LEVAQUIN) IV 500 mg (12/12/18 1710)   PRN Meds: acetaminophen **OR** acetaminophen, diphenhydrAMINE **AND** acetaminophen, ipratropium-albuterol, magnesium hydroxide, ondansetron **OR** ondansetron (ZOFRAN) IV, traZODone   Vital Signs    Vitals:   12/12/18 1527 12/12/18 1924 12/13/18 0324 12/13/18 0711  BP: (!) 134/93 (!) 142/92 (!) 153/96 138/90  Pulse: (!) 102 98 93 79  Resp: 20 18 18 19   Temp: 98.2 F (36.8 C) 98 F  (36.7 C) 98 F (36.7 C) 98.4 F (36.9 C)  TempSrc: Oral Oral Oral Oral  SpO2: 98% 97% 96% 94%  Weight:   64.2 kg   Height:        Intake/Output Summary (Last 24 hours) at 12/13/2018 0812 Last data filed at 12/12/2018 1710 Gross per 24 hour  Intake 2050.53 ml  Output 550 ml  Net 1500.53 ml   Filed Weights   12/12/18 0934 12/13/18 0324  Weight: 64.8 kg 64.2 kg    Telemetry    Afib, 80s to low 100s bpm - Personally Reviewed  ECG    Afib with RVR, 132 bpm, LVH, poor R wave progression along the precordial leads, nonspecific lateral st/t changes - Personally Reviewed  Physical Exam   GEN: No acute distress.   Neck: No JVD. Cardiac: Irregularly irregular, no murmurs, rubs, or gallops.  Respiratory:Coarse breath sounds along the right middle lobe.  GI: Soft, nontender, non-distended.   MS: No edema; No deformity. Neuro:  Alert and oriented x 3; Nonfocal.  Psych: Normal affect.  Labs    Chemistry Recent Labs  Lab 12/11/18 2204 12/12/18 0438 12/13/18 0548  NA 140 140 140  K 3.8 3.1* 3.8  CL 106 108 111  CO2 24 23 26   GLUCOSE 130* 95 103*  BUN 14 11 12   CREATININE 0.83 0.52 0.52  CALCIUM 9.7 8.6* 8.7*  GFRNONAA >60 >60 >60  GFRAA >60 >60 >60  ANIONGAP 10 9 3*     Hematology  Recent Labs  Lab 12/11/18 2050 12/12/18 0438 12/13/18 0548  WBC 7.9 6.0 5.7  RBC 5.24* 4.34 4.25  HGB 14.6 12.1 11.9*  HCT 46.5* 37.7 37.4  MCV 88.7 86.9 88.0  MCH 27.9 27.9 28.0  MCHC 31.4 32.1 31.8  RDW 15.5 14.9 15.2  PLT 240 206 201    Cardiac EnzymesNo results for input(s): TROPONINI in the last 168 hours. No results for input(s): TROPIPOC in the last 168 hours.   BNPNo results for input(s): BNP, PROBNP in the last 168 hours.   DDimer No results for input(s): DDIMER in the last 168 hours.   Radiology    Dg Chest 2 View  Result Date: 12/11/2018 IMPRESSION: Right middle lobe consolidation with associated effusion. Electronically Signed   By: Inez Catalina M.D.   On:  12/11/2018 21:13    Cardiac Studies   2D Echo 12/04/2018: 1. Left ventricular ejection fraction, by visual estimation, is 40 to 45%. The left ventricle has mildly decreased function. Left ventricular septal wall thickness was mildly increased. Mildly increased left ventricular posterior wall thickness. There  is mildly increased left ventricular hypertrophy.  2. Global right ventricle has normal systolic function.The right ventricular size is mildly enlarged. No increase in right ventricular wall thickness.  3. Left atrial size was mildly dilated.  4. Right atrial size was moderately dilated.  5. The mitral valve is grossly normal. Trace mitral valve regurgitation.  6. The tricuspid valve is grossly normal. Tricuspid valve regurgitation is mild.  7. The aortic valve is grossly normal Aortic valve regurgitation was not visualized by color flow Doppler.  8. The pulmonic valve was not well visualized. Pulmonic valve regurgitation is trivial by color flow Doppler.  9. Aortic dilatation noted. 10. There is mild dilatation of the aortic root measuring 3.5 cm mm. 11. Mildly elevated pulmonary artery systolic pressure. 12. The interatrial septum was not assessed.  Patient Profile     73 y.o. 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 with syncope PNA, and Afib with RVR.  Assessment & Plan    1. Persistent Afib s/p cryo-ablation in 2018 with recurrence of arrhythmia: -Remains in Afib with controlled ventricular response -Her tachycardic rates were likely in the setting of her PNA -High risk for recurrence of RVR in the setting of ongoing PNA -Continue  short acting diltiazem for now given acute illness with consolidation prior to discharge -She does have a history of bradycardia on higher dose AV nodal blocking agents, so would be cautious in escalating  -CHADS2VASc 6 (HTN, age x 2, stroke x 2, female) -Eliquis  2. Syncope: -Uncertain etiology, cannot exclude SSS vs significant pause vs malignant arrhythmia vs vagal episode at this time -She does not recall the details -Attempt to get in touch the husband  -No significant arrhythmias on tele, continue to monitor  -Will need real time Zio at discharge  -Recent echo as above -Recent carotid ultrasound unrevealing   3. PNA: Per IM  4. Hypokalemia: -Improved  For questions or updates, please contact [place] Please consult [email or web address] for contact info under Cardiology/STEMI.    Signed, Christell Faith, PA-C [place] Pager: [phone] 12/13/2018, 8:12 AM

## 2018-12-13 NOTE — Progress Notes (Signed)
  Advance care planning  Purpose of Encounter Pneumonia and Afib  Parties in Attendance Patient, Husband at bedside  Patients Decisional capacity Alert and awake. Husband is HCPOA and at bedside  Discussed in detail regarding Pneumonia, Afib and Recent CVA.  Treatment plan , prognosis discussed.  All questions answered  Code status discussed with husband as patient defers all questions to husband. Patient was in hospital recently with CVA and extremely weak once she got home and unable to ambulate. Husband requesting SNF.  Code status discussed and patient and husband requesting full code status with aggressive medical care.  Orders entered and CODE STATUS changed  FULL CODE  Time spent - 17 minutes

## 2018-12-13 NOTE — Progress Notes (Signed)
[place] at [place] NAME: Norma Hill    MR#:  [id number]  DATE OF BIRTH:  May 31, 1945  SUBJECTIVE:   Sitting in bed. Husband at bedside. Not on O2  No SOB/CP/Cough  REVIEW OF SYSTEMS:  Review of Systems  Constitutional: Negative for chills and fever.  HENT: Negative for congestion and sore throat.   Eyes: Negative for blurred vision and double vision.  Respiratory: Positive for shortness of breath. Negative for cough.   Cardiovascular: Negative for chest pain, palpitations and leg swelling.  Gastrointestinal: Negative for nausea and vomiting.  Genitourinary: Negative for dysuria and urgency.  Musculoskeletal: Negative for back pain and neck pain.  Neurological: Negative for dizziness and headaches.  Psychiatric/Behavioral: Negative for depression. The patient is not nervous/anxious.    DRUG ALLERGIES:  No Known Allergies VITALS:  Blood pressure 138/90, pulse 79, temperature 98.4 F (36.9 C), temperature source Oral, resp. rate 19, height 5\' 5"  (1.651 m), weight 64.2 kg, SpO2 94 %. PHYSICAL EXAMINATION:  Physical Exam  GENERAL:  Laying in the bed with no acute distress.  HEENT: Head atraumatic, normocephalic. Pupils equal, round, reactive to light and accommodation. No scleral icterus. Extraocular muscles intact. Oropharynx and nasopharynx clear.  NECK:  Supple, no jugular venous distention. No thyroid enlargement. LUNGS: + Crackles present in the right lung base. No use of accessory muscles of respiration.  CARDIOVASCULAR: Irregularly irregular rhythm, regular rate, S1, S2 normal. No murmurs, rubs, or gallops.  ABDOMEN: Soft, nontender, nondistended. Bowel sounds present.  EXTREMITIES: No pedal edema, cyanosis, or clubbing.  NEUROLOGIC: CN 2-12 intact, no focal deficits. 5/5 muscle strength throughout all extremities. Sensation intact throughout. Gait not checked.  PSYCHIATRIC: The patient is alert and awake SKIN: No obvious  rash, lesion, or ulcer.   LABORATORY PANEL:  Female CBC Recent Labs  Lab 12/13/18 0548  WBC 5.7  HGB 11.9*  HCT 37.4  PLT 201   ------------------------------------------------------------------------------------------------------------------ Chemistries  Recent Labs  Lab 12/12/18 0438 12/13/18 0548  NA 140 140  K 3.1* 3.8  CL 108 111  CO2 23 26  GLUCOSE 95 103*  BUN 11 12  CREATININE 0.52 0.52  CALCIUM 8.6* 8.7*  MG 1.8  --    RADIOLOGY:  No results found. ASSESSMENT AND PLAN:   * Right middle lobe pneumonia with small right pleural effusion Likely aspiration Afebrile and not needing O2. Normal WBC - Change from ceftriaxone and azithromycin to Levaquin, as this HCAP given patient's recent admission - pro calcitonin Negative -Duonebs prn Will check CXR in AM decide regarding Abx.  * Paroxysmal atrial fibrillation with RVR Heart rate better on Cardizem -Continue home eliquis -Cardiology following -Cardiac monitoring  * Syncope- likely due to above -Cancel echo and carotid Doppler orders, as patient just had this done 1 week ago -Cardiac monitoring.  * Recent large right MCA territory stroke-patient was just admitted from 9/30-10/5 -Continue aspirin and Eliquis  * Hypokalemia -Repleted  * Urinary retention-noted at last admission -Will continue Foley -Patient will need to follow-up with urology on discharge for voiding trial  * Hypertension- BP mildly elevated today -Continue home lisinopril  * Hyperlipidemia-stable -Continue home Crestor  All the records are reviewed and case discussed with Care Management/Social Worker. Management plans discussed with the patient, family and they are in agreement.  CODE STATUS: Full Code  TOTAL TIME TAKING CARE OF THIS PATIENT: 40 minutes.   POSSIBLE D/C IN 1-2 DAYS, DEPENDING ON CLINICAL CONDITION.  Athziry Millican R  Stephene Alegria M.D on 12/13/2018 at 1:33 PM  Between 7am to 6pm - Pager - [phone]  After 6pm go to  [email or web address] - Proofreader  [facility] [place] [facility]  Office  [phone]  CC: Primary care physician; Guadalupe Maple, MD  Note: This dictation was prepared with Dragon dictation along with smaller phrase technology. Any transcriptional errors that result from this process are unintentional.

## 2018-12-13 NOTE — Progress Notes (Signed)
   12/13/18 1600  Clinical Encounter Type  Visited With Patient and family together  Visit Type Initial  Referral From Nurse  Spiritual Encounters  Spiritual Needs Brochure;Emotional;Grief support  Stress Factors  Family Stress Factors Health changes;Loss   Chaplain received an OR to complete or update an AD for this patient. Upon arrival, the patient was sitting up in bed with her husband at the bedside. Chaplain engaged both in conversation about their needs. The patient and husband confirmed interest in finalizing the AD documents. Notary and witnesses secured to complete document. Hard copies given to patient and her husband; scanned copy given to unit secretary to include in the patient's EMR.  The patient's husband shared the loss of a family friend's 34 year old son today, which opened the conversation to deeper sharing and a life review. The patient's husband talked about the beautiful life that he and the patient have shared together, which has been in focus due to her recent illness. They both hope for better days, patience, strength, and faith to keep going forward. This chaplain provided support in the form of active and reflective listening, compassionate ministerial presence, and theological reflection. The patient and her husband expressed gratitude for the support and visit.

## 2018-12-13 NOTE — Evaluation (Signed)
Physical Therapy Evaluation Patient Details Name: Norma Hill MRN: [id number] DOB: Sep 09, 1945 Today's Date: 12/13/2018   History of Present Illness  73 y.o. female with a past medical history of a-fib on elequis, HTN, CVA, CA. Pt has multiple recent admissions, most recent for pneumonia. CT from previous admission revealed subacute infarct in the R MCA and PCA.  Clinical Impression  Pt is a pleasant 73 year old F who was admitted for pneumonia. Upon entry, pt is resting in bed without pain, but reports feeling unsteady to walk. Pt performs bed mobility, transfers, and ambulation with CGA/supervision and use of RW. Pt reports feeling more comfortable with walker to prevent reaching. Pt demonstrates deficits with strength/mobility/cognition. Pt will benefit from skilled PT to address above deficits; current follow-up care recommendation is HHPT. Pt's husband remains concerned about her cognition, OT consult recommended to assess further for placement.     Follow Up Recommendations Home health PT;Supervision/Assistance - 24 hour    Equipment Recommendations  Rolling walker with 5" wheels    Recommendations for Other Services       Precautions / Restrictions Precautions Precautions: Fall Restrictions Weight Bearing Restrictions: No      Mobility  Bed Mobility Overal bed mobility: Needs Assistance Bed Mobility: Supine to Sit     Supine to sit: Min guard     General bed mobility comments: min guard of LE's when bringing to EOB; pt tends to have pauses during tasks expressing confusion on what I want her to do  Transfers Overall transfer level: Needs assistance Equipment used: Rolling walker (2 wheeled) Transfers: Sit to/from Stand Sit to Stand: Supervision         General transfer comment: Pt able to perform with supervision. Use of RW to increase confidence with mobility and preven pt from reaching excessively  Ambulation/Gait Ambulation/Gait assistance:  Supervision Gait Distance (Feet): 100 Feet Assistive device: Rolling walker (2 wheeled) Gait Pattern/deviations: Step-through pattern Gait velocity: 1.17 ft/sec Gait velocity interpretation: <1.31 ft/sec, indicative of household ambulator General Gait Details: Pt amb successfully with [place] of RW. Slowed speed present. Pt reports that she feels more confident with RW  Stairs            Wheelchair Mobility    Modified Rankin (Stroke Patients Only)       Balance Overall balance assessment: Needs assistance Sitting-balance support: Feet supported Sitting balance-Leahy Scale: Normal     Standing balance support: Bilateral upper extremity supported Standing balance-Leahy Scale: Good Standing balance comment: Pt used RW after stating she was feeling unsteady to walk.                              Pertinent Vitals/Pain Pain Assessment: 0-10 Pain Score: 2  Pain Location: R LE Pain Descriptors / Indicators: Aching(only during walking) Pain Intervention(s): Limited activity within patient's tolerance;Monitored during session;Repositioned    Home Living Family/patient expects to be discharged to:: Private residence Living Arrangements: Spouse/significant other Available Help at Discharge: Family;Available 24 hours/day Type of Home: House Home Access: Stairs to enter Entrance Stairs-Rails: None Entrance Stairs-Number of Steps: 1 Home Layout: One level Home Equipment: None Additional Comments: Pt reveals to be independent in ADL's; husband states that she is just not functioning like she used to.    Prior Function Level of Independence: Independent         Comments: Pt.. reports being independent with ADLs, and IADLs, driving.     Hand Dominance  Dominant Hand: Right    Extremity/Trunk Assessment   Upper Extremity Assessment Upper Extremity Assessment: Overall WFL for tasks assessed    Lower Extremity Assessment Lower Extremity Assessment: Overall  WFL for tasks assessed    Cervical / Trunk Assessment Cervical / Trunk Assessment: Kyphotic  Communication   Communication: No difficulties  Cognition Arousal/Alertness: Awake/alert Behavior During Therapy: WFL for tasks assessed/performed Overall Cognitive Status: Within Functional Limits for tasks assessed                                 General Comments: Pt A, O x3      General Comments General comments (skin integrity, edema, etc.): Pt required verbal cueing periodically to clarify the task    Exercises Other Exercises Other Exercises: Seated EOB, bilat: 10 reps, LAQ, March, AP; PT supervision Other Exercises: Pt education on how to amb using walker   Assessment/Plan    PT Assessment Patient needs continued PT services  PT Problem List Decreased coordination;Decreased safety awareness;Decreased strength;Decreased activity tolerance;Decreased balance       PT Treatment Interventions Gait training;Stair training;Functional mobility training;Therapeutic activities;Balance training;Therapeutic exercise;Patient/family education;DME instruction    PT Goals (Current goals can be found in the Care Plan section)  Acute Rehab PT Goals Patient Stated Goal: To return home PT Goal Formulation: With patient/family Time For Goal Achievement: 12/27/18 Potential to Achieve Goals: Good    Frequency Min 2X/week   Barriers to discharge        Co-evaluation               AM-PAC PT "6 Clicks" Mobility  Outcome Measure Help needed turning from your back to your side while in a flat bed without using bedrails?: None Help needed moving from lying on your back to sitting on the side of a flat bed without using bedrails?: None Help needed moving to and from a bed to a chair (including a wheelchair)?: A Little Help needed standing up from a chair using your arms (e.g., wheelchair or bedside chair)?: A Little Help needed to walk in hospital room?: A Little Help needed  climbing 3-5 steps with a railing? : A Little 6 Click Score: 20    End of Session Equipment Utilized During Treatment: Gait belt Activity Tolerance: Patient tolerated treatment well Patient left: in bed;with call bell/phone within reach;with bed alarm set;with family/visitor present   PT Visit Diagnosis: Other symptoms and signs involving the nervous system (R29.898);Muscle weakness (generalized) (M62.81)    Time: 1358-1440 PT Time Calculation (min) (ACUTE ONLY): 42 min   Charges:   PT Evaluation $PT Eval Low Complexity: 1 Low PT Treatments $Therapeutic Exercise: 23-37 mins        Srihith Aquilino, SPT   Berdell Hostetler 12/13/2018, 4:36 PM

## 2018-12-13 NOTE — Progress Notes (Signed)
Per RN report Foley is chronic. Per patient Foley is acute. Instructed to discuss w/ physician when patient sees r/t possible removal. Will continue to monitor. Wenda Low [facility]

## 2018-12-13 NOTE — Plan of Care (Signed)

## 2018-12-14 ENCOUNTER — Inpatient Hospital Stay: Payer: Medicare Other

## 2018-12-14 DIAGNOSIS — J189 Pneumonia, unspecified organism: Secondary | ICD-10-CM

## 2018-12-14 LAB — BASIC METABOLIC PANEL
Anion gap: 10 (ref 5–15)
BUN: 10 mg/dL (ref 8–23)
CO2: 24 mmol/L (ref 22–32)
Calcium: 8.7 mg/dL — ABNORMAL LOW (ref 8.9–10.3)
Chloride: 107 mmol/L (ref 98–111)
Creatinine, Ser: 0.49 mg/dL (ref 0.44–1.00)
GFR calc Af Amer: >60 mL/min (ref >60)
GFR calc non Af Amer: >60 mL/min (ref >60)
Glucose, Bld: 91 mg/dL (ref 70–99)
Potassium: 3.3 mmol/L — ABNORMAL LOW (ref 3.5–5.1)
Sodium: 141 mmol/L (ref 135–145)

## 2018-12-14 LAB — CBC WITH DIFFERENTIAL/PLATELET
Abs Immature Granulocytes: 0.02 10*3/uL (ref 0.00–0.07)
Basophils Absolute: 0.1 10*3/uL (ref 0.0–0.1)
Basophils Relative: 1 %
Eosinophils Absolute: 0.2 10*3/uL (ref 0.0–0.5)
Eosinophils Relative: 3 %
HCT: 37 % (ref 36.0–46.0)
Hemoglobin: 11.9 g/dL — ABNORMAL LOW (ref 12.0–15.0)
Immature Granulocytes: 0 %
Lymphocytes Relative: 33 %
Lymphs Abs: 1.8 10*3/uL (ref 0.7–4.0)
MCH: 27.8 pg (ref 26.0–34.0)
MCHC: 32.2 g/dL (ref 30.0–36.0)
MCV: 86.4 fL (ref 80.0–100.0)
Monocytes Absolute: 0.6 10*3/uL (ref 0.1–1.0)
Monocytes Relative: 10 %
Neutro Abs: 2.9 10*3/uL (ref 1.7–7.7)
Neutrophils Relative %: 53 %
Platelets: 207 10*3/uL (ref 150–400)
RBC: 4.28 MIL/uL (ref 3.87–5.11)
RDW: 15.3 % (ref 11.5–15.5)
WBC: 5.6 10*3/uL (ref 4.0–10.5)
nRBC: 0 % (ref 0.0–0.2)

## 2018-12-14 MED ORDER — SODIUM CHLORIDE 0.9 % IV SOLN
3.0000 g | Freq: Four times a day (QID) | INTRAVENOUS | Status: DC
  Administered 2018-12-14 – 2018-12-18 (×16): 3 g via INTRAVENOUS
  Filled 2018-12-14 (×3): qty 3
  Filled 2018-12-14: qty 8
  Filled 2018-12-14: qty 3
  Filled 2018-12-14: qty 8
  Filled 2018-12-14: qty 3
  Filled 2018-12-14 (×2): qty 8
  Filled 2018-12-14 (×2): qty 3
  Filled 2018-12-14: qty 8
  Filled 2018-12-14 (×2): qty 3
  Filled 2018-12-14: qty 8
  Filled 2018-12-14 (×2): qty 3
  Filled 2018-12-14: qty 8
  Filled 2018-12-14: qty 3

## 2018-12-14 MED ORDER — POTASSIUM CHLORIDE CRYS ER 20 MEQ PO TBCR
40.0000 meq | EXTENDED_RELEASE_TABLET | Freq: Once | ORAL | Status: AC
  Administered 2018-12-14: 40 meq via ORAL
  Filled 2018-12-14: qty 2

## 2018-12-14 NOTE — Plan of Care (Signed)
  Problem: Education: Goal: Knowledge of General Education information will improve Description: Including pain rating scale, medication(s)/side effects and non-pharmacologic comfort measures Outcome: Progressing   Problem: Health Behavior/Discharge Planning: Goal: Ability to manage health-related needs will improve Outcome: Not Progressing Note: Patient w/ PNA, now back on IV ABX. Won't d/c until at least the AM. Will continue to monitor respiratory status. Wenda Low [facility]

## 2018-12-14 NOTE — Evaluation (Signed)
Occupational Therapy Evaluation Patient Details Name: Norma Hill MRN: [id number] DOB: 1945-12-14 Today's Date: 12/14/2018    History of Present Illness 73 y.o. female with a past medical history of a-fib on elequis, HTN, CVA, CA. Pt has multiple recent admissions, most recent for pneumonia. CT from previous admission revealed subacute infarct in the R MCA and PCA.   Clinical Impression   Pt admitted with above diagnoses, with cognitive deficits, decreased activity tolerance, and generalized weakness limiting ability to engage in BADL at desired level of ind. Husband present for home info since pt not reliable historian. He states that the past few weeks increased in difficulty with care, and that pt fell in shower with difficulty getting her up with himself and her son assisting. Prior to her stroke, she was ind. At time of eval she is overall min guard for bed mobility and min A for sit <> stand. Pt stood at sink to complete grooming tasks with min guard. She presents with cognitive deficits described below that impact safety and BADL completion at this time. Husband has concerns and wanting SNF, unsure if pt will qualify given current functional status. Education given on difference in venues of care to husband and will continue to monitor pt for progress. At this time recommending [facility] with 24/7 supervision. Will continue to follow per POC listed below.      Follow Up Recommendations  Home health OT;Supervision/Assistance - 24 hour;Other (comment)(husband wants SNF- follow for progress)    Equipment Recommendations  None recommended by OT    Recommendations for Other Services       Precautions / Restrictions Precautions Precautions: Fall Restrictions Weight Bearing Restrictions: No      Mobility Bed Mobility Overal bed mobility: Needs Assistance Bed Mobility: Supine to Sit     Supine to sit: Min guard     General bed mobility comments: min guard for  safety  Transfers Overall transfer level: Needs assistance Equipment used: Rolling walker (2 wheeled) Transfers: Sit to/from Stand Sit to Stand: Min assist         General transfer comment: min A to rise and stedy. Additional use of RW for increased stability    Balance Overall balance assessment: Needs assistance Sitting-balance support: Feet supported Sitting balance-Leahy Scale: Normal     Standing balance support: Bilateral upper extremity supported Standing balance-Leahy Scale: Fair Standing balance comment: reaching for objects when did not have external support                           ADL either performed or assessed with clinical judgement   ADL Overall ADL's : Needs assistance/impaired Eating/Feeding: Set up;Sitting   Grooming: Min guard;Standing;Oral care;Wash/dry face Grooming Details (indicate cue type and reason): min guard for safety Upper Body Bathing: Minimal assistance;Sitting;Cueing for safety   Lower Body Bathing: Minimal assistance;Sit to/from stand;Sitting/lateral leans;Cueing for safety   Upper Body Dressing : Minimal assistance;Sitting;Cueing for safety   Lower Body Dressing: Minimal assistance;Sit to/from stand;Sitting/lateral leans;Cueing for safety   Toilet Transfer: Nature conservation officer;Ambulation;RW   Toileting- Clothing Manipulation and Hygiene: Minimal assistance;Sit to/from stand;Sitting/lateral lean       Functional mobility during ADLs: Min guard;Rolling walker General ADL Comments: ltd by cognitive deficits, higher level balance deficits     Vision Patient Visual Report: No change from baseline       Perception     Praxis      Pertinent Vitals/Pain Pain Assessment: No/denies pain  Hand Dominance Right   Extremity/Trunk Assessment Upper Extremity Assessment Upper Extremity Assessment: Generalized weakness   Lower Extremity Assessment Lower Extremity Assessment: Defer to PT evaluation        Communication Communication Communication: No difficulties   Cognition Arousal/Alertness: Awake/alert Behavior During Therapy: WFL for tasks assessed/performed Overall Cognitive Status: Impaired/Different from baseline Area of Impairment: Orientation;Memory;Safety/judgement                 Orientation Level: Disoriented to;Time;Situation(unable to state year; did not recall having a stroke or pneumonia)   Memory: Decreased short-term memory   Safety/Judgement: Decreased awareness of deficits;Decreased awareness of safety     General Comments: pt unable to recall year or date, cannot explain why she is in the hospital or why she was previously. She has a decreased awareness of safety and deficits. She perseverates on having to urinate despite having a foley in place   General Comments       Exercises     Shoulder Instructions      Home Living Family/patient expects to be discharged to:: Private residence Living Arrangements: Spouse/significant other Available Help at Discharge: Family;Available 24 hours/day Type of Home: House Home Access: Stairs to enter [employer] of Steps: 1 Entrance Stairs-Rails: None Home Layout: One level         Biochemist, clinical: Standard     Home Equipment: None          Prior Functioning/Environment Level of Independence: Independent        Comments: most functional decline been since stroke, where husband has been assisting with IADLs. Prior to stroke pt ind with BADL/IADL        OT Problem List: Decreased strength;Decreased knowledge of use of DME or AE;Decreased activity tolerance;Decreased cognition;Impaired balance (sitting and/or standing);Decreased safety awareness      OT Treatment/Interventions: Self-care/ADL training;Therapeutic exercise;Patient/family education;Balance training;Neuromuscular education;Therapeutic activities;DME and/or AE instruction;Cognitive remediation/compensation    OT  Goals(Current goals can be found in the care plan section) Acute Rehab OT Goals Patient Stated Goal: get back to ind OT Goal Formulation: With patient/family Time For Goal Achievement: 12/28/18 Potential to Achieve Goals: Good  OT Frequency: Min 2X/week   Barriers to D/C:            Co-evaluation              AM-PAC OT "6 Clicks" Daily Activity     Outcome Measure Help from another person eating meals?: A Little Help from another person taking care of personal grooming?: A Little Help from another person toileting, which includes using toliet, bedpan, or urinal?: A Little Help from another person bathing (including washing, rinsing, drying)?: A Little Help from another person to put on and taking off regular upper body clothing?: A Little Help from another person to put on and taking off regular lower body clothing?: A Little 6 Click Score: 18   End of Session Equipment Utilized During Treatment: Gait belt;Rolling walker Nurse Communication: Mobility status  Activity Tolerance: Patient tolerated treatment well Patient left: in chair;with call bell/phone within reach;with chair alarm set;with family/visitor present  OT Visit Diagnosis: Muscle weakness (generalized) (M62.81);Other abnormalities of gait and mobility (R26.89);Other symptoms and signs involving cognitive function                Time: [id number] OT Time Calculation (min): 34 min Charges:  OT General Charges $OT Visit: 1 Visit OT Evaluation $OT Eval Low Complexity: 1 Low OT Treatments $Self Care/Home Management : 8-22 mins  Donnetta Simpers  Dalia Heading MSOT, OTR/L Behavioral Health OT/ Acute Relief OT [facility] [phone]   Zenovia Jarred 12/14/2018, 12:21 PM

## 2018-12-14 NOTE — Progress Notes (Addendum)
Progress Note  Patient Name: Norma Hill Date of Encounter: 12/14/2018  Primary Cardiologist: Chalmers Guest Patient Profile     73 y.o. female admitted with recurrent syncope.  The most episode occurred the day following discharge after a 2-week hospitalization for acute stroke.  She had gotten up from her bed had gone to the bathroom where they were going to give her sponge bath.  She lost consciousness.  "Melted away "and with recovery of consciousness at about 10 minutes.  On arrival of EMS her blood pressure was 132 and her pulse was 88.  She was brought to the hospital.  She has a history of atrial fibrillation now thought to be permanent.  Multiple strokes including just recently with a large right MCA infarct.  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  Prior syncopal event occurred following the initiation of new medicine by Dr. Bunnie Pion.  She was transported to [facility].  Those notes were reviewed.  There is a concern about flecainide versus calcium blocker toxicity.  In route she had had heart rates in the 20s unresponsive to atropine.  She was transcutaneously paced.  There were discussions as to whether she needed pacing.  Was recently seen at [facility] 12/19 in the discussions were asked to pacing and aggressive medications for rate control versus AV junction ablation.  There was no follow-up.  Anticoagulated with apixaban.  Also an adjunctive aspirin  Husband describes significant mental status issues for example getting lost on the way to the hairdresser, not knowing the schedule for the hairdresser, not knowing how to use the microwave and other utensils at home.   Thromboembolic risk factors ( age -60, HTN-1, TIA/CVA-2, Vasc disease -1, Gender-1) for a CHADSVASc Score of >=7   Subjective   Denies chest pain or shortness of breath.  There is  considerable distress between her and her husband related to her mental status.     Inpatient Medications    Scheduled Meds: . apixaban  5 mg Oral BID  . aspirin EC  81 mg Oral Daily  . Chlorhexidine Gluconate Cloth  6 each Topical Daily  . diltiazem  120 mg Oral Daily  . feeding supplement (ENSURE ENLIVE)  237 mL Oral BID BM  . guaiFENesin  600 mg Oral BID  . influenza vaccine adjuvanted  0.5 mL Intramuscular Tomorrow-1000  . lisinopril  5 mg Oral Daily  . multivitamin with minerals  1 tablet Oral Daily  . rosuvastatin  10 mg Oral Daily  . sodium chloride flush  10 mL Intravenous Q12H   Continuous Infusions: . ampicillin-sulbactam (UNASYN) IV     PRN Meds: acetaminophen **OR** acetaminophen, diphenhydrAMINE **AND** acetaminophen, ipratropium-albuterol, magnesium hydroxide, ondansetron **OR** ondansetron (ZOFRAN) IV, traZODone   Vital Signs    Vitals:   12/13/18 1519 12/13/18 1942 12/14/18 0431 12/14/18 0859  BP: (!) 153/59 (!) 151/83 (!) 153/93 (!) 144/84  Pulse: 86 85 (!) 109 (!) 102  Resp: 19 20  20   Temp: 98.4 F (36.9 C) 98.4 F (36.9 C) 97.8 F (36.6 C) 98.4 F (36.9 C)  TempSrc:  Oral Oral Oral  SpO2: 95% 96% 92% 94%  Weight:   61.2 kg   Height:        Intake/Output Summary (Last 24 hours) at 12/14/2018 1222 Last data filed at 12/14/2018 0453 Gross per 24 hour  Intake -  Output 2250  ml  Net -2250 ml   Last 3 Weights 12/14/2018 12/13/2018 12/12/2018  Weight (lbs) 135 lb 141 lb 8.9 oz 142 lb 13.7 oz  Weight (kg) 61.236 kg 64.21 kg 64.8 kg      Telemetry    Afib with RVR - Personally Reviewed  ECG       Physical Exam    GEN: No acute distress.   Neck: No JVD Cardiac: Irregular rate and rhythm Respiratory: Clear to auscultation bilaterally. GI: Soft, nontender, non-distended  MS: 1+ edema; No deformity. Neuro:  Nonfocal  Psych: Normal affect   Labs    High Sensitivity Troponin:   Recent Labs  Lab 12/04/18 1128 12/04/18 1458 12/11/18 2204  12/12/18 0438 12/12/18 0940  TROPONINIHS 16 16 10 9 10       Chemistry Recent Labs  Lab 12/12/18 0438 12/13/18 0548 12/14/18 0356  NA 140 140 141  K 3.1* 3.8 3.3*  CL 108 111 107  CO2 23 26 24   GLUCOSE 95 103* 91  BUN 11 12 10   CREATININE 0.52 0.52 0.49  CALCIUM 8.6* 8.7* 8.7*  GFRNONAA >60 >60 >60  GFRAA >60 >60 >60  ANIONGAP 9 3* 10     Hematology Recent Labs  Lab 12/12/18 0438 12/13/18 0548 12/14/18 0356  WBC 6.0 5.7 5.6  RBC 4.34 4.25 4.28  HGB 12.1 11.9* 11.9*  HCT 37.7 37.4 37.0  MCV 86.9 88.0 86.4  MCH 27.9 28.0 27.8  MCHC 32.1 31.8 32.2  RDW 14.9 15.2 15.3  PLT 206 201 207    BNPNo results for input(s): BNP, PROBNP in the last 168 hours.   DDimer No results for input(s): DDIMER in the last 168 hours.   Radiology    Dg Chest 2 View  Result Date: 12/14/2018 CLINICAL DATA:  Pneumonia.  Nonsmoker. EXAM: CHEST - 2 VIEW COMPARISON:  12/11/2018 FINDINGS: Hyperinflation. Patient rotated right. Moderate cardiomegaly. Atherosclerosis in the transverse aorta. Small right pleural effusion. No pneumothorax. Slight increase in right middle lobe and right lower lobe airspace disease. Clear left lung. No overt congestive failure. IMPRESSION: Progressive right middle and right lower lobe airspace disease, likely representing pneumonia. Followup PA and lateral chest X-ray is recommended in 3-4 weeks following trial of antibiotic therapy to ensure resolution and exclude underlying malignancy. Small right pleural effusion. Aortic Atherosclerosis (ICD10-I70.0). Electronically Signed   By: Abigail Miyamoto M.D.   On: 12/14/2018 10:40    Cardiac Studies   Echo 9/20>> [id number] but LA size mild      Assessment & Plan    Atrial fibrillation with a rapid rate  Strokes-recurrent  Syncope  Hypokalemia  Pneumonia ? 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  I suspect it  was related to hypotension following prolonged bedrest.  It is worth measuring her orthostatics.  Potential aggravation by her pneumonia   Is her pneumonia indicative of aspiration   She has a history of difficult to control rates and discussions in the [facility] note from 12/19 suggested a strategy of pacing with or without AV junction blockade/AV junction ablation.  They also consider catheter ablation which I think is probably inappropriate given the degree of her dementia.  I think the question for the family is whether they would like to return to [facility] for further management of their atrial fibrillation including junction ablation and pacing.  Alternatively we could do that here.  I am more inclined to do that then to proceed with a loop recorder implantation given the history of bradycardia previously documented.  There is discrepancy in the reports of her echocardiogram from related to left atrial size.  Most recent echo describes "mild left atrial enlargement "last echo in July reports severe left atrial enlargement and I suspect the latter is the more accurate.  We will increase her diltiazem today from 120--240 for augmented rate control    For questions or updates, please contact [place] Please consult [email or web address] for contact info under        Signed, Virl Axe, MD  12/14/2018, 12:22 PM

## 2018-12-14 NOTE — Progress Notes (Signed)
[facility] [place] NAME: Lindsay Frevert    MR#:  [id number]  DATE OF BIRTH:  Jan 19, 1946  SUBJECTIVE:   Sitting in a chair today . Husband at bedside.  Afebrile  Still weak  REVIEW OF SYSTEMS:  Review of Systems  Constitutional: Negative for chills and fever.  HENT: Negative for congestion and sore throat.   Eyes: Negative for blurred vision and double vision.  Respiratory: Positive for shortness of breath. Negative for cough.   Cardiovascular: Negative for chest pain, palpitations and leg swelling.  Gastrointestinal: Negative for nausea and vomiting.  Genitourinary: Negative for dysuria and urgency.  Musculoskeletal: Negative for back pain and neck pain.  Neurological: Negative for dizziness and headaches.  Psychiatric/Behavioral: Negative for depression. The patient is not nervous/anxious.    DRUG ALLERGIES:  No Known Allergies VITALS:  Blood pressure (!) 144/84, pulse (!) 102, temperature 98.4 F (36.9 C), temperature source Oral, resp. rate 20, height 5\' 5"  (1.651 m), weight 61.2 kg, SpO2 94 %. PHYSICAL EXAMINATION:  Physical Exam  GENERAL:  Laying in the bed with no acute distress.  HEENT: Head atraumatic, normocephalic. Pupils equal, round, reactive to light and accommodation. No scleral icterus. Extraocular muscles intact. Oropharynx and nasopharynx clear.  NECK:  Supple, no jugular venous distention. No thyroid enlargement. LUNGS: + Crackles present in the right lung base. No use of accessory muscles of respiration.  CARDIOVASCULAR: Irregularly irregular rhythm, regular rate, S1, S2 normal. No murmurs, rubs, or gallops.  ABDOMEN: Soft, nontender, nondistended. Bowel sounds present.  EXTREMITIES: No pedal edema, cyanosis, or clubbing.  NEUROLOGIC: CN 2-12 intact, no focal deficits. 5/5 muscle strength throughout all extremities. Sensation intact throughout. Gait not checked.  PSYCHIATRIC: The patient is alert and awake  SKIN: No obvious rash, lesion, or ulcer.   LABORATORY PANEL:  Female CBC Recent Labs  Lab 12/14/18 0356  WBC 5.6  HGB 11.9*  HCT 37.0  PLT 207   ------------------------------------------------------------------------------------------------------------------ Chemistries  Recent Labs  Lab 12/12/18 0438  12/14/18 0356  NA 140   < > 141  K 3.1*   < > 3.3*  CL 108   < > 107  CO2 23   < > 24  GLUCOSE 95   < > 91  BUN 11   < > 10  CREATININE 0.52   < > 0.49  CALCIUM 8.6*   < > 8.7*  MG 1.8  --   --    < > = values in this interval not displayed.   RADIOLOGY:  Dg Chest 2 View  Result Date: 12/14/2018 CLINICAL DATA:  Pneumonia.  Nonsmoker. EXAM: CHEST - 2 VIEW COMPARISON:  12/11/2018 FINDINGS: Hyperinflation. Patient rotated right. Moderate cardiomegaly. Atherosclerosis in the transverse aorta. Small right pleural effusion. No pneumothorax. Slight increase in right middle lobe and right lower lobe airspace disease. Clear left lung. No overt congestive failure. IMPRESSION: Progressive right middle and right lower lobe airspace disease, likely representing pneumonia. Followup PA and lateral chest X-ray is recommended in 3-4 weeks following trial of antibiotic therapy to ensure resolution and exclude underlying malignancy. Small right pleural effusion. Aortic Atherosclerosis (ICD10-I70.0). Electronically Signed   By: Abigail Miyamoto M.D.   On: 12/14/2018 10:40   ASSESSMENT AND PLAN:   * Right middle lobe pneumonia with small right pleural effusion Likely aspiration Afebrile and not needing O2. Normal WBC - pro calcitonin Negative - But CXR shows worsening today. Will resume IV abx. Unasyn -Duonebs prn  *  Paroxysmal atrial fibrillation with RVR Heart rate better on Cardizem -Continue home eliquis -Cardiology following. Waiting for further input -Cardiac monitoring  * Syncope- likely due to above -Cancel echo and carotid Doppler orders, as patient just had this done 1 week ago  -Cardiac monitoring.  * Recent large right MCA territory stroke-patient was just admitted from 9/30-10/5 -Continue aspirin and Eliquis  * Hypokalemia -Repleted  * Urinary retention-noted at last admission -Will continue Foley -Patient will need to follow-up with urology on discharge for voiding trial  * Hypertension- BP mildly elevated today -Continue home lisinopril  * Hyperlipidemia-stable -Continue home Crestor  All the records are reviewed and case discussed with Care Management/Social Worker. Management plans discussed with the patient, family and they are in agreement.  CODE STATUS: Full Code  TOTAL TIME TAKING CARE OF THIS PATIENT: 40 minutes.   POSSIBLE D/C IN 1-2 DAYS, DEPENDING ON CLINICAL CONDITION.  Leia Alf Lynwood Kubisiak M.D on 12/14/2018 at 12:58 PM  Between 7am to 6pm - Pager - [phone]  After 6pm go to [email or web address] - Proofreader  [employer] [facility]  Office  [phone]  CC: Primary care physician; Guadalupe Maple, MD  Note: This dictation was prepared with Dragon dictation along with smaller phrase technology. Any transcriptional errors that result from this process are unintentional.

## 2018-12-14 NOTE — Consult Note (Signed)
Pharmacy Antibiotic Note  Norma Hill is a 73 y.o. female admitted on 12/11/2018 with Aspiration Pneumonia.  Pharmacy has been consulted for Unasyn dosing.  Plan: Start Unasyn 3g IV every 6 hours.  Height: 5\' 5"  (165.1 cm) Weight: 135 lb (61.2 kg) IBW/kg (Calculated) : 57  Temp (24hrs), Avg:98.3 F (36.8 C), Min:97.8 F (36.6 C), Max:98.4 F (36.9 C)  Recent Labs  Lab 12/09/18 0410 12/11/18 2050 12/11/18 2204 12/12/18 0438 12/13/18 0548 12/14/18 0356  WBC  --  7.9  --  6.0 5.7 5.6  CREATININE 0.51  --  0.83 0.52 0.52 0.49  LATICACIDVEN  --  1.7  --   --   --   --     Estimated Creatinine Clearance: 54.7 mL/min (by C-G formula based on SCr of 0.49 mg/dL).    No Known Allergies  Antimicrobials this admission: 10/7 levofloxacin >>  10/9 10/8 Azithro/CTX>> x1 10/10 Unasyn >>    Thank you for allowing pharmacy to be a part of this patient's care.  Pernell Dupre, PharmD, BCPS Clinical Pharmacist 12/14/2018 11:45 AM

## 2018-12-14 NOTE — Plan of Care (Signed)
  Problem: Clinical Measurements: Goal: Respiratory complications will improve Outcome: Progressing Goal: Cardiovascular complication will be avoided Outcome: Progressing   Problem: Safety: Goal: Ability to remain free from injury will improve Outcome: Progressing   Problem: Activity: Goal: Risk for activity intolerance will decrease Outcome: Not Progressing Note: Encourage self care, repositioning in bed   Problem: Pain Managment: Goal: General experience of comfort will improve Outcome: Completed/Met

## 2018-12-15 DIAGNOSIS — I4821 Permanent atrial fibrillation: Secondary | ICD-10-CM

## 2018-12-15 DIAGNOSIS — I951 Orthostatic hypotension: Secondary | ICD-10-CM

## 2018-12-15 LAB — BASIC METABOLIC PANEL
Anion gap: 11 (ref 5–15)
BUN: 16 mg/dL (ref 8–23)
CO2: 25 mmol/L (ref 22–32)
Calcium: 9.2 mg/dL (ref 8.9–10.3)
Chloride: 105 mmol/L (ref 98–111)
Creatinine, Ser: 0.56 mg/dL (ref 0.44–1.00)
GFR calc Af Amer: >60 mL/min (ref >60)
GFR calc non Af Amer: >60 mL/min (ref >60)
Glucose, Bld: 104 mg/dL — ABNORMAL HIGH (ref 70–99)
Potassium: 3.5 mmol/L (ref 3.5–5.1)
Sodium: 141 mmol/L (ref 135–145)

## 2018-12-15 MED ORDER — DILTIAZEM HCL ER COATED BEADS 120 MG PO CP24
120.0000 mg | ORAL_CAPSULE | Freq: Two times a day (BID) | ORAL | Status: DC
  Administered 2018-12-15 – 2018-12-19 (×9): 120 mg via ORAL
  Filled 2018-12-15 (×9): qty 1

## 2018-12-15 NOTE — Progress Notes (Signed)
Progress Note  Patient Name: Norma Hill Date of Encounter: 12/15/2018  Primary Cardiologist: Chalmers Guest Patient Profile     73 y.o. female admitted with recurrent syncope.  The most episode occurred the day following discharge after a 2-week hospitalization for acute stroke.  She had gotten up from her bed had gone to the bathroom where they were going to give her sponge bath.  She lost consciousness.  "Melted away "and with recovery of consciousness at about 10 minutes.  On arrival of EMS her blood pressure was 132 and her pulse was 88.  She was brought to the hospital.  She has a history of atrial fibrillation now thought to be permanent.  Multiple strokes including just recently with a large right MCA infarct.  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  Prior syncopal event occurred following the initiation of new medicine by Dr. Bunnie Pion.  She was transported to [facility].  Those notes were reviewed.  There is a concern about flecainide versus calcium blocker toxicity.  In route she had had heart rates in the 20s unresponsive to atropine.  She was transcutaneously paced.  There were discussions as to whether she needed pacing.  Was recently seen at [facility] 12/19 in the discussions were asked to pacing and aggressive medications for rate control versus AV junction ablation.  There was no follow-up.  Anticoagulated with apixaban.  Also an adjunctive aspirin  Husband describes significant mental status issues for example getting lost on the way to the hairdresser, not knowing the schedule for the hairdresser, not knowing how to use the microwave and other utensils at home.   Thromboembolic risk factors ( age -80, HTN-1, TIA/CVA-2, Vasc disease -1, Gender-1) for a CHADSVASc Score of >=7   Subjective   Denies sob today      There is considerable  distress between her and her husband related to her mental status.   Her son with whom I spoke today has some but fewer and less emotion concerns  Inpatient Medications    Scheduled Meds: . apixaban  5 mg Oral BID  . aspirin EC  81 mg Oral Daily  . Chlorhexidine Gluconate Cloth  6 each Topical Daily  . diltiazem  120 mg Oral BID  . feeding supplement (ENSURE ENLIVE)  237 mL Oral BID BM  . guaiFENesin  600 mg Oral BID  . influenza vaccine adjuvanted  0.5 mL Intramuscular Tomorrow-1000  . lisinopril  5 mg Oral Daily  . multivitamin with minerals  1 tablet Oral Daily  . rosuvastatin  10 mg Oral Daily  . sodium chloride flush  10 mL Intravenous Q12H   Continuous Infusions: . ampicillin-sulbactam (UNASYN) IV 3 g (12/15/18 1145)   PRN Meds: acetaminophen **OR** acetaminophen, diphenhydrAMINE **AND** acetaminophen, ipratropium-albuterol, magnesium hydroxide, ondansetron **OR** ondansetron (ZOFRAN) IV, traZODone   Vital Signs    Vitals:   12/14/18 0859 12/14/18 1959 12/15/18 0420 12/15/18 0733  BP: (!) 144/84 (!) 156/71 124/67 123/78  Pulse: (!) 102 89 95 99  Resp: 20 18 18 19   Temp: 98.4 F (36.9 C) 97.8 F (36.6 C) 98.2 F (36.8 C) 97.7 F (36.5 C)  TempSrc: Oral Oral  Oral  SpO2: 94% 95% 95% 95%  Weight:   61.8 kg   Height:        Intake/Output Summary (Last 24 hours) at 12/15/2018 1239 Last data filed  at 12/15/2018 1005 Gross per 24 hour  Intake 1131.29 ml  Output 950 ml  Net 181.29 ml   Last 3 Weights 12/15/2018 12/14/2018 12/13/2018  Weight (lbs) 136 lb 3.2 oz 135 lb 141 lb 8.9 oz  Weight (kg) 61.78 kg 61.236 kg 64.21 kg      Telemetry    Afib 90-110 Personally Reviewed  ECG       Physical Exam  Well developed and nourished in no acute distress HENT normal Neck supple with JVP-flat Carotids brisk and full without bruits Clear Irregularly irregular rate and rhythm with controlled ventricular response, no murmurs or gallops Abd-soft with active BS  without hepatomegaly No Clubbing cyanosis tr edema Skin-warm and dry A & Oriented  Grossly normal sensory and motor function   Labs    High Sensitivity Troponin:   Recent Labs  Lab 12/04/18 1128 12/04/18 1458 12/11/18 2204 12/12/18 0438 12/12/18 0940  TROPONINIHS 16 16 10 9 10       Chemistry Recent Labs  Lab 12/13/18 0548 12/14/18 0356 12/15/18 1006  NA 140 141 141  K 3.8 3.3* 3.5  CL 111 107 105  CO2 26 24 25   GLUCOSE 103* 91 104*  BUN 12 10 16   CREATININE 0.52 0.49 0.56  CALCIUM 8.7* 8.7* 9.2  GFRNONAA >60 >60 >60  GFRAA >60 >60 >60  ANIONGAP 3* 10 11     Hematology Recent Labs  Lab 12/12/18 0438 12/13/18 0548 12/14/18 0356  WBC 6.0 5.7 5.6  RBC 4.34 4.25 4.28  HGB 12.1 11.9* 11.9*  HCT 37.7 37.4 37.0  MCV 86.9 88.0 86.4  MCH 27.9 28.0 27.8  MCHC 32.1 31.8 32.2  RDW 14.9 15.2 15.3  PLT 206 201 207    BNPNo results for input(s): BNP, PROBNP in the last 168 hours.   DDimer No results for input(s): DDIMER in the last 168 hours.   Radiology    Dg Chest 2 View  Result Date: 12/14/2018 CLINICAL DATA:  Pneumonia.  Nonsmoker. EXAM: CHEST - 2 VIEW COMPARISON:  12/11/2018 FINDINGS: Hyperinflation. Patient rotated right. Moderate cardiomegaly. Atherosclerosis in the transverse aorta. Small right pleural effusion. No pneumothorax. Slight increase in right middle lobe and right lower lobe airspace disease. Clear left lung. No overt congestive failure. IMPRESSION: Progressive right middle and right lower lobe airspace disease, likely representing pneumonia. Followup PA and lateral chest X-ray is recommended in 3-4 weeks following trial of antibiotic therapy to ensure resolution and exclude underlying malignancy. Small right pleural effusion. Aortic Atherosclerosis (ICD10-I70.0). Electronically Signed   By: Abigail Miyamoto M.D.   On: 12/14/2018 10:40    Cardiac Studies   Echo 9/20>> [id number] but LA size mild      Assessment & Plan    Atrial fibrillation with a  rapid rate  Strokes-recurrent  Syncope  Hypokalemia  Pneumonia ? 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 but has been managed on diltiazem for some time   Check BMET  K is at lower limit of normal  Increase Dilt  Modest orthostasis may challenge HR control 2/2 BP effects of the rate controlling drugs--  Her orthostasis may be related to her prolonged hospital course over this admission and the last and the intercurrent pneumonia so will hold off on specific therapies for now--but being forewarned may help prevent another fall   From yday--She has a history of difficult to control rates and discussions in the [facility] note from 12/19 suggested  a strategy of pacing with or without AV junction blockade/AV junction ablation.  They also consider catheter ablation which I think is probably inappropriate given the degree of her dementia.  I think the question for the family is whether they would like to return to [facility] for further management of their atrial fibrillation including junction ablation and pacing.  Alternatively we could do that here.  I am more inclined to do that then to proceed with a loop recorder implantation given the history of bradycardia previously documented.  There is discrepancy in the reports of her echocardiogram from related to left atrial size.  Most recent echo describes "mild left atrial enlargement "last echo in July reports severe left atrial enlargement and I suspect the latter is the more accurate.<<   We will increase her diltiazem today from 120--240 for augmented rate control  Reiterated that family iwll need to choose between followup at [facility] or here     For questions or updates, please contact [place] Please consult [email or web address] for contact info under        Signed, Virl Axe, MD  12/15/2018, 12:39 PM

## 2018-12-15 NOTE — Progress Notes (Signed)
[place] [facility] [place] NAME: Norma Hill    MR#:  [id number]  DATE OF BIRTH:  05-Mar-1946  SUBJECTIVE:   Sitting in a chair . Feels well. Afebrile  HR 90-110 Afib  REVIEW OF SYSTEMS:  Review of Systems  Constitutional: Negative for chills and fever.  HENT: Negative for congestion and sore throat.   Eyes: Negative for blurred vision and double vision.  Respiratory: Positive for shortness of breath. Negative for cough.   Cardiovascular: Negative for chest pain, palpitations and leg swelling.  Gastrointestinal: Negative for nausea and vomiting.  Genitourinary: Negative for dysuria and urgency.  Musculoskeletal: Negative for back pain and neck pain.  Neurological: Negative for dizziness and headaches.  Psychiatric/Behavioral: Negative for depression. The patient is not nervous/anxious.    DRUG ALLERGIES:  No Known Allergies VITALS:  Blood pressure 123/78, pulse 99, temperature 97.7 F (36.5 C), temperature source Oral, resp. rate 19, height 5\' 5"  (1.651 m), weight 61.8 kg, SpO2 95 %. PHYSICAL EXAMINATION:  Physical Exam  GENERAL:  Laying in the bed with no acute distress.  HEENT: Head atraumatic, normocephalic. Pupils equal, round, reactive to light and accommodation. No scleral icterus. Extraocular muscles intact. Oropharynx and nasopharynx clear.  NECK:  Supple, no jugular venous distention. No thyroid enlargement. LUNGS: + Crackles present in the right lung base. No use of accessory muscles of respiration.  CARDIOVASCULAR: Irregularly irregular rhythm, regular rate, S1, S2 normal. No murmurs, rubs, or gallops.  ABDOMEN: Soft, nontender, nondistended. Bowel sounds present.  EXTREMITIES: No pedal edema, cyanosis, or clubbing.  NEUROLOGIC: CN 2-12 intact, no focal deficits. 5/5 muscle strength throughout all extremities. Sensation intact throughout. Gait not checked.  PSYCHIATRIC: The patient is alert and awake SKIN: No obvious rash,  lesion, or ulcer.   LABORATORY PANEL:  Female CBC Recent Labs  Lab 12/14/18 0356  WBC 5.6  HGB 11.9*  HCT 37.0  PLT 207   ------------------------------------------------------------------------------------------------------------------ Chemistries  Recent Labs  Lab 12/12/18 0438  12/15/18 1006  NA 140   < > 141  K 3.1*   < > 3.5  CL 108   < > 105  CO2 23   < > 25  GLUCOSE 95   < > 104*  BUN 11   < > 16  CREATININE 0.52   < > 0.56  CALCIUM 8.6*   < > 9.2  MG 1.8  --   --    < > = values in this interval not displayed.   RADIOLOGY:  No results found. ASSESSMENT AND PLAN:   * Right middle lobe pneumonia with small right pleural effusion Likely aspiration Afebrile and not needing O2. Normal WBC - pro calcitonin Negative - But repeat CXR shows worsening and resumed IV abx unasyn -Duonebs prn  * Paroxysmal atrial fibrillation with RVR Heart rate better on Cardizem -Continue home eliquis -Cardiology following. Waiting for further input. Ablation? -Cardiac monitoring.  * Syncope- likely due to above -Canceled echo and carotid Doppler orders, as patient just had this done 1 week ago -Cardiac monitoring.  * Recent large right MCA territory stroke-patient was just admitted from 9/30-10/5 -Continue aspirin and Eliquis  * Hypokalemia -Repleted  * Urinary retention-noted at last admission -Will continue Foley -Patient will need to follow-up with urology on discharge for voiding trial  * Hypertension- BP mildly elevated today -Continue home lisinopril  * Hyperlipidemia-stable -Continue home Crestor  HR still elevated and likely d/c tomorrow. Home health at discharge.  All  the records are reviewed and case discussed with Care Management/Social Worker. Management plans discussed with the patient, family and they are in agreement.  CODE STATUS: Full Code  TOTAL TIME TAKING CARE OF THIS PATIENT: 35 minutes.   POSSIBLE D/C IN 1-2 DAYS, DEPENDING ON CLINICAL  CONDITION.  Leia Alf Norma Hill M.D on 12/15/2018 at 12:11 PM  Between 7am to 6pm - Pager - [phone]  After 6pm go to [email or web address] - Proofreader  [facility]  Office  [phone]  CC: Primary care physician; Guadalupe Maple, MD  Note: This dictation was prepared with Dragon dictation along with smaller phrase technology. Any transcriptional errors that result from this process are unintentional.

## 2018-12-15 NOTE — Plan of Care (Signed)
  Problem: Clinical Measurements: Goal: Ability to maintain clinical measurements within normal limits will improve Outcome: Progressing   Problem: Clinical Measurements: Goal: Respiratory complications will improve Outcome: Progressing   Problem: Clinical Measurements: Goal: Cardiovascular complication will be avoided Outcome: Progressing   Problem: Safety: Goal: Ability to remain free from injury will improve Outcome: Progressing: Pt moved closer to nurses station for safety due to intermitted confusion.   Problem: Respiratory: Goal: Ability to maintain adequate ventilation will improve Outcome: Progressing

## 2018-12-15 NOTE — Plan of Care (Signed)
  Problem: Education: Goal: Knowledge of General Education information will improve Description: Including pain rating scale, medication(s)/side effects and non-pharmacologic comfort measures Outcome: Progressing   Problem: Health Behavior/Discharge Planning: Goal: Ability to manage health-related needs will improve Outcome: Progressing   Problem: Clinical Measurements: Goal: Ability to maintain clinical measurements within normal limits will improve Outcome: Progressing Goal: Diagnostic test results will improve Outcome: Progressing Note: K 3.5 this morning Goal: Respiratory complications will improve Outcome: Progressing Goal: Cardiovascular complication will be avoided Outcome: Progressing   Problem: Activity: Goal: Risk for activity intolerance will decrease Outcome: Progressing   Problem: Clinical Measurements: Goal: Ability to maintain a body temperature in the normal range will improve Outcome: Progressing   Problem: Respiratory: Goal: Ability to maintain adequate ventilation will improve Outcome: Progressing   Problem: Clinical Measurements: Goal: Will remain free from infection Outcome: Not Progressing Note: Remains aferile, but continues on IV antibioticas at this time

## 2018-12-16 DIAGNOSIS — I4819 Other persistent atrial fibrillation: Secondary | ICD-10-CM

## 2018-12-16 LAB — CULTURE, BLOOD (ROUTINE X 2)
Culture: NO GROWTH
Culture: NO GROWTH
Special Requests: ADEQUATE
Special Requests: ADEQUATE

## 2018-12-16 MED ORDER — POTASSIUM CHLORIDE CRYS ER 20 MEQ PO TBCR
20.0000 meq | EXTENDED_RELEASE_TABLET | Freq: Once | ORAL | Status: AC
  Administered 2018-12-16: 20 meq via ORAL
  Filled 2018-12-16: qty 1

## 2018-12-16 MED ORDER — DIPHENHYDRAMINE HCL 25 MG PO CAPS: 25.0000 mg | ORAL_CAPSULE | Freq: Every evening | ORAL | Status: DC | PRN

## 2018-12-16 MED ORDER — ACETAMINOPHEN 500 MG PO TABS: 500.0000 mg | ORAL_TABLET | Freq: Every evening | ORAL | Status: DC | PRN

## 2018-12-16 MED ORDER — SODIUM CHLORIDE 0.9 % IV SOLN
INTRAVENOUS | Status: DC | PRN
  Administered 2018-12-16 (×2): 250 mL via INTRAVENOUS

## 2018-12-16 MED ORDER — DOCUSATE SODIUM 100 MG PO CAPS
100.0000 mg | ORAL_CAPSULE | Freq: Two times a day (BID) | ORAL | Status: DC
  Administered 2018-12-16 (×2): 100 mg via ORAL
  Filled 2018-12-16 (×3): qty 1

## 2018-12-16 NOTE — Progress Notes (Signed)
Progress Note  Patient Name: Norma Hill Date of Encounter: 12/16/2018  Primary Cardiologist: [facility]  Subjective   No complaints this afternoon. Remains in Afib with ventricular rates in the 80s to 90s bpm at rest increasing into the 130s bpm with movement.   Inpatient Medications    Scheduled Meds: . apixaban  5 mg Oral BID  . aspirin EC  81 mg Oral Daily  . Chlorhexidine Gluconate Cloth  6 each Topical Daily  . diltiazem  120 mg Oral BID  . feeding supplement (ENSURE ENLIVE)  237 mL Oral BID BM  . guaiFENesin  600 mg Oral BID  . influenza vaccine adjuvanted  0.5 mL Intramuscular Tomorrow-1000  . lisinopril  5 mg Oral Daily  . multivitamin with minerals  1 tablet Oral Daily  . rosuvastatin  10 mg Oral Daily  . sodium chloride flush  10 mL Intravenous Q12H   Continuous Infusions: . sodium chloride 250 mL (12/16/18 1128)  . ampicillin-sulbactam (UNASYN) IV 3 g (12/16/18 1131)   PRN Meds: sodium chloride, acetaminophen **OR** acetaminophen, diphenhydrAMINE **AND** acetaminophen, ipratropium-albuterol, magnesium hydroxide, ondansetron **OR** ondansetron (ZOFRAN) IV, traZODone   Vital Signs    Vitals:   12/15/18 1537 12/15/18 2038 12/16/18 0431 12/16/18 0728  BP: 119/74 133/85 (!) 144/77 (!) 144/79  Pulse: 83 98 83 85  Resp: 19 20 18 19   Temp: 97.8 F (36.6 C) 98.4 F (36.9 C) 97.9 F (36.6 C) 98.6 F (37 C)  TempSrc:  Oral    SpO2: 97% 96% 95% 93%  Weight:   63 kg   Height:        Intake/Output Summary (Last 24 hours) at 12/16/2018 1339 Last data filed at 12/16/2018 1338 Gross per 24 hour  Intake 130 ml  Output 1400 ml  Net -1270 ml   Filed Weights   12/14/18 0431 12/15/18 0420 12/16/18 0431  Weight: 61.2 kg 61.8 kg 63 kg    Telemetry    Afib with ventricular rates ranging from the 80s to 130s bpm - Personally Reviewed  ECG    No new tracings - Personally Reviewed  Physical Exam   GEN: No acute distress.   Neck: No JVD. Cardiac:  Irregularly irregular, no murmurs, rubs, or gallops.  Respiratory: Clear to auscultation bilaterally.  GI: Soft, nontender, non-distended.   MS: No edema; No deformity. Neuro:  Alert and oriented x 3; Nonfocal.  Psych: Normal affect.  Labs    Chemistry Recent Labs  Lab 12/13/18 0548 12/14/18 0356 12/15/18 1006  NA 140 141 141  K 3.8 3.3* 3.5  CL 111 107 105  CO2 26 24 25   GLUCOSE 103* 91 104*  BUN 12 10 16   CREATININE 0.52 0.49 0.56  CALCIUM 8.7* 8.7* 9.2  GFRNONAA >60 >60 >60  GFRAA >60 >60 >60  ANIONGAP 3* 10 11     Hematology Recent Labs  Lab 12/12/18 0438 12/13/18 0548 12/14/18 0356  WBC 6.0 5.7 5.6  RBC 4.34 4.25 4.28  HGB 12.1 11.9* 11.9*  HCT 37.7 37.4 37.0  MCV 86.9 88.0 86.4  MCH 27.9 28.0 27.8  MCHC 32.1 31.8 32.2  RDW 14.9 15.2 15.3  PLT 206 201 207    Cardiac EnzymesNo results for input(s): TROPONINI in the last 168 hours. No results for input(s): TROPIPOC in the last 168 hours.   BNPNo results for input(s): BNP, PROBNP in the last 168 hours.   DDimer No results for input(s): DDIMER in the last 168 hours.   Radiology  No results found.  Cardiac Studies   2D Echo 12/04/2018: 1. Left ventricular ejection fraction, by visual estimation, is 40 to 45%. The left ventricle has mildly decreased function. Left ventricular septal wall thickness was mildly increased. Mildly increased left ventricular posterior wall thickness. There  is mildly increased left ventricular hypertrophy.  2. Global right ventricle has normal systolic function.The right ventricular size is mildly enlarged. No increase in right ventricular wall thickness.  3. Left atrial size was mildly dilated.  4. Right atrial size was moderately dilated.  5. The mitral valve is grossly normal. Trace mitral valve regurgitation.  6. The tricuspid valve is grossly normal. Tricuspid valve regurgitation is mild.  7. The aortic valve is grossly normal Aortic valve regurgitation was not  visualized by color flow Doppler.  8. The pulmonic valve was not well visualized. Pulmonic valve regurgitation is trivial by color flow Doppler.  9. Aortic dilatation noted. 10. There is mild dilatation of the aortic root measuring 3.5 cm mm. 11. Mildly elevated pulmonary artery systolic pressure. 12. The interatrial septum was not assessed.  Patient Profile     73 y.o. female with history of persistent Afib s/p cryo-ablation in 2018 with recurrence of Afib in the setting of PNA and pleural effusion requiring chest tube and has remained in Afib since, large right MCA stroke with left-sided hemiparesis in early 12/2018, syncope with cardiac monitor showing 3 second pauses while awake (asymptomatic), breast cancer s/p lumpectomy, and HTN re-admitted to [facility] with syncope PNA, and Afib with RVR.  Assessment & Plan    1. Persistent Afib s/p ablation in 2018 with recurrence: -Ventricular rates are improving and in the 80s bpm at rest -With movement, her ventricular rates appear to become tachycardic -She has a history of significant bradycardia with escalation of beta blocker, which is why this has been avoided this admission -Continue increased dose of Cardizem CD 120 mg bid, will discuss with MD regarding potential further titration  -Continue Eliquis -She has not thought any further regarding if she would like to follow up with [facility] or [place] -Recommend EP rounding again on 10/13 for further discussion regarding long term treatment plan (see detailed EP note from 10/11)  2. Syncope: -Uncertain etiology -Noted to be orthostatic, prior Zio has shown 3-second pauses, prior issues with bradycardia on beta blockers -EP less inclined to move forward with a loop recorder, especially in the setting of underlying dementia  -No prolonged pauses noted on telemetry over the past 24 hours  3. Possible aspiration PNA: -Per IM  4. Orthostatic hypotension: -This has precluded some advancement in rate  controlling strategies   5. History of CVA: -Involving the right MCA territory  -ASA (per neurology) -Eliquis -Noted to have profound cerebral atherosclerotic plaque   6. HFrEF: -Presumed to be tachy-mediated -Rate control as above with plans for short term repeat limited echo to reassess LVSF -Consider ischemic evaluation   7. Hypokalemia: -Improving -Replete to goal of 4.0   For questions or updates, please contact [place] Please consult [email or web address] for contact info under Cardiology/STEMI.    Signed, Christell Faith, PA-C [facility] Pager: [phone] 12/16/2018, 1:39 PM

## 2018-12-16 NOTE — Plan of Care (Signed)
  Problem: Clinical Measurements: Goal: Ability to maintain clinical measurements within normal limits will improve Outcome: Progressing   Problem: Clinical Measurements: Goal: Respiratory complications will improve Outcome: Progressing   Problem: Clinical Measurements: Goal: Cardiovascular complication will be avoided Outcome: Progressing   Problem: Elimination: Goal: Will not experience complications related to bowel motility Outcome: Progressing   Problem: Elimination: Goal: Will not experience complications related to urinary retention Outcome: Progressing   Problem: Safety: Goal: Ability to remain free from injury will improve Outcome: Progressing   Problem: Clinical Measurements: Goal: Ability to maintain a body temperature in the normal range will improve Outcome: Progressing   Problem: Respiratory: Goal: Ability to maintain adequate ventilation will improve Outcome: Progressing

## 2018-12-16 NOTE — Care Management Important Message (Signed)
Important Message  Patient Details  Name: Norma Hill MRN: [id number] Date of Birth: 12-Dec-1945   Medicare Important Message Given:  Yes     Dannette Barbara 12/16/2018, 2:08 PM

## 2018-12-16 NOTE — Progress Notes (Signed)
Pt had 4 beat run of SVT at 0508. Pt was asymptomatic. Will continue to monitor.

## 2018-12-16 NOTE — Progress Notes (Signed)
Physical Therapy Treatment Patient Details Name: Norma Hill MRN: [id number] DOB: 23-Aug-1945 Today's Date: 12/16/2018    History of Present Illness 72 y.o. female with a past medical history of a-fib on elequis, HTN, CVA, CA. Pt has multiple recent admissions, most recent for pneumonia. CT from previous admission revealed subacute infarct in the R MCA and PCA.    PT Comments    Pt is progressing toward goals. Upon entry, pt is resting in bed. Pt is not oriented to time nor situation and requires significant verbal cueing throughout session This date, pt performs bed mobility with min assist and vc, transfers/ambulation performed with use of RW and CGA from PT. Pt able to increase amb distance today without pain in LE. Pt will continue to benefit from skilled PT to address deficits in strength/activity tolerance/mobility.      Follow Up Recommendations  Home health PT;Supervision/Assistance - 24 hour     Equipment Recommendations  Rolling walker with 5" wheels    Recommendations for Other Services       Precautions / Restrictions Precautions Precautions: Fall Restrictions Weight Bearing Restrictions: No    Mobility  Bed Mobility Overal bed mobility: Needs Assistance Bed Mobility: Supine to Sit     Supine to sit: Min assist     General bed mobility comments: Pt requires assist and verbal cueing to bring legs to EOB.    Transfers Overall transfer level: Needs assistance Equipment used: Rolling walker (2 wheeled) Transfers: Sit to/from Stand Sit to Stand: Min guard         General transfer comment: min guard, use of RW  Ambulation/Gait Ambulation/Gait assistance: Supervision Gait Distance (Feet): 200 Feet Assistive device: Rolling walker (2 wheeled) Gait Pattern/deviations: Step-through pattern;Decreased stride length Gait velocity: 2.2 ft/sec Gait velocity interpretation: 1.31 - 2.62 ft/sec, indicative of limited community Nurse, children's Rankin (Stroke Patients Only)       Balance Overall balance assessment: Needs assistance Sitting-balance support: Feet supported Sitting balance-Leahy Scale: Good Sitting balance - Comments: Pt able to attain upright posture without hands suported   Standing balance support: Bilateral upper extremity supported Standing balance-Leahy Scale: Fair Standing balance comment: UE support for comfort and to prevent reaching                            Cognition Arousal/Alertness: Awake/alert Behavior During Therapy: WFL for tasks assessed/performed Overall Cognitive Status: Impaired/Different from baseline Area of Impairment: Orientation;Memory;Safety/judgement                 Orientation Level: Disoriented to;Time;Situation             General Comments: Pt reports to be unsure how she is feeling this morning. Unable to recall year/date, and is not sure why she is here. States president is Maudie Flakes      Exercises Other Exercises Other Exercises: Supine, 10 reps, bilat: AP, QS, SLR Other Exercises: Standing, 10 reps, bilateral: march    General Comments General comments (skin integrity, edema, etc.): Pt not oriented this date. Orthostatics: Supine BP 135/78 HR 99 SpO2 93; Seated EOB BP154/83 HR 102 SpO2 91; Stand BP 137/72 HR 91 SpO2 95. Pt reports no symptoms throughout testing. BP normalizes to 148/86 within 2 min of standing      Pertinent Vitals/Pain Pain Assessment: No/denies pain Pain Score: 0-No  pain    Home Living                      Prior Function            PT Goals (current goals can now be found in the care plan section) Acute Rehab PT Goals Patient Stated Goal: get back to ind PT Goal Formulation: With patient/family Time For Goal Achievement: 12/27/18 Potential to Achieve Goals: Good Progress towards PT goals: Progressing toward goals    Frequency    Min 2X/week       PT Plan Current plan remains appropriate    Co-evaluation              AM-PAC PT "6 Clicks" Mobility   Outcome Measure  Help needed turning from your back to your side while in a flat bed without using bedrails?: None Help needed moving from lying on your back to sitting on the side of a flat bed without using bedrails?: A Little Help needed moving to and from a bed to a chair (including a wheelchair)?: A Little Help needed standing up from a chair using your arms (e.g., wheelchair or bedside chair)?: A Little Help needed to walk in hospital room?: A Little Help needed climbing 3-5 steps with a railing? : A Little 6 Click Score: 19    End of Session Equipment Utilized During Treatment: Gait belt Activity Tolerance: Patient tolerated treatment well Patient left: in chair;with call bell/phone within reach;with chair alarm set;with family/visitor present Nurse Communication: Mobility status PT Visit Diagnosis: Other symptoms and signs involving the nervous system (R29.898);Muscle weakness (generalized) (M62.81)     Time: [id number] PT Time Calculation (min) (ACUTE ONLY): 38 min  Charges:                        Dixie Dials, SPT   Ladonne Sharples 12/16/2018, 11:21 AM

## 2018-12-16 NOTE — Progress Notes (Signed)
Bladder scan done at 2220 found 474mL. After helping pt to the [facility] pt could only void 80mL. On call, Ouma, NP, paged and verbal order received to do in and out cath. Results of in and out cath were 455mL. Peri-care performed before and after cath. Pt resting comfortably. Will continue to monitor.

## 2018-12-16 NOTE — Progress Notes (Signed)
Peri-care performed.  Foley catheter discontinued. Bladder scan to be done at 2245.

## 2018-12-16 NOTE — Progress Notes (Addendum)
[place] [facility] [place] NAME: Norma Hill    MR#:  [id number]  DATE OF BIRTH:  [birth year]  SUBJECTIVE:   Patient has no concerns this morning.  She states she "is not really sure how she feels".  She was able to ambulate around the nursing station with a walker with physical therapy this morning.  She denies any chest pain, shortness of breath, or palpitations.  REVIEW OF SYSTEMS:  Review of Systems  Constitutional: Negative for chills and fever.  HENT: Negative for congestion and sore throat.   Eyes: Negative for blurred vision and double vision.  Respiratory: Positive for shortness of breath. Negative for cough.   Cardiovascular: Negative for chest pain, palpitations and leg swelling.  Gastrointestinal: Negative for nausea and vomiting.  Genitourinary: Negative for dysuria and urgency.  Musculoskeletal: Negative for back pain and neck pain.  Neurological: Negative for dizziness and headaches.  Psychiatric/Behavioral: Negative for depression. The patient is not nervous/anxious.    DRUG ALLERGIES:  No Known Allergies VITALS:  Blood pressure (!) 144/79, pulse 85, temperature 98.6 F (37 C), resp. rate 19, height 5\' 5"  (1.651 m), weight 63 kg, SpO2 93 %. PHYSICAL EXAMINATION:  Physical Exam  GENERAL:  Laying in the bed with no acute distress.  HEENT: Head atraumatic, normocephalic. Pupils equal, round, reactive to light and accommodation. No scleral icterus. Extraocular muscles intact. Oropharynx and nasopharynx clear.  NECK:  Supple, no jugular venous distention. No thyroid enlargement. LUNGS: + Crackles present in the right lung base. No use of accessory muscles of respiration.  CARDIOVASCULAR: Irregularly irregular rhythm, regular rate, S1, S2 normal. No murmurs, rubs, or gallops.  ABDOMEN: Soft, nontender, nondistended. Bowel sounds present.  EXTREMITIES: No pedal edema, cyanosis, or clubbing.  NEUROLOGIC: CN 2-12 intact, no focal  deficits. 5/5 muscle strength throughout all extremities. Sensation intact throughout. Gait not checked.  PSYCHIATRIC: The patient is alert and awake SKIN: No obvious rash, lesion, or ulcer.   LABORATORY PANEL:  Female CBC Recent Labs  Lab 12/14/18 0356  WBC 5.6  HGB 11.9*  HCT 37.0  PLT 207   ------------------------------------------------------------------------------------------------------------------ Chemistries  Recent Labs  Lab 12/12/18 0438  12/15/18 1006  NA 140   < > 141  K 3.1*   < > 3.5  CL 108   < > 105  CO2 23   < > 25  GLUCOSE 95   < > 104*  BUN 11   < > 16  CREATININE 0.52   < > 0.56  CALCIUM 8.6*   < > 9.2  MG 1.8  --   --    < > = values in this interval not displayed.   RADIOLOGY:  No results found. ASSESSMENT AND PLAN:   Right middle lobe pneumonia with small right pleural effusion- likely aspiration pneumonia.  Procalcitonin is negative, so doubt bacterial pneumonia. Likely aspiration -Continue IV Unasyn  Paroxysmal atrial fibrillation- initially with RVR, but heart rates have improved -Diltiazem dose was increased yesterday by cardiology -Continue home eliquis -Patient will need to follow-up with [facility] cardiology or [facility] for consideration of possible ablation  Syncope- likely due to above -Recent ECHO and carotid dopplers were unremarkable -Cardiac monitoring  Recent large right MCA territory stroke- patient was just admitted from 9/30-10/5 -Continue aspirin and Eliquis -PT recommending HHPT  Urinary retention-noted at last admission -Foley catheter has been in for 10 days -Will remove foley for voiding trial today -Bladder scan every 8 hours  Hypertension-  BP overall well-controlled -Continue home lisinopril  Hyperlipidemia-stable -Continue home Crestor  Plan for likely discharge tomorrow with home health. Husband, Gwyndolyn Saxon, updated via the phone.  All the records are reviewed and case discussed with Care Management/Social Worker.  Management plans discussed with the patient, family and they are in agreement.  CODE STATUS: Full Code  TOTAL TIME TAKING CARE OF THIS PATIENT: 35 minutes.   POSSIBLE D/C IN 1-2 DAYS, DEPENDING ON CLINICAL CONDITION.  Berna Spare Mayo M.D on 12/16/2018 at 1:49 PM  Between 7am to 6pm - Pager - [phone]  After 6pm go to [email or web address] - Proofreader  [employer] [facility]  [facility]  Office  [phone]  CC: Primary care physician; Guadalupe Maple, MD  Note: This dictation was prepared with Dragon dictation along with smaller phrase technology. Any transcriptional errors that result from this process are unintentional.

## 2018-12-17 MED ORDER — SENNOSIDES-DOCUSATE SODIUM 8.6-50 MG PO TABS: 2.0000 | ORAL_TABLET | Freq: Two times a day (BID) | ORAL | Status: DC

## 2018-12-17 MED ORDER — POLYETHYLENE GLYCOL 3350 17 G PO PACK
17.0000 g | PACK | Freq: Two times a day (BID) | ORAL | Status: DC
  Administered 2018-12-18 – 2018-12-20 (×4): 17 g via ORAL
  Filled 2018-12-17 (×5): qty 1

## 2018-12-17 MED ORDER — POLYETHYLENE GLYCOL 3350 17 G PO PACK: 17.0000 g | PACK | Freq: Two times a day (BID) | ORAL | Status: DC

## 2018-12-17 MED ORDER — SENNOSIDES-DOCUSATE SODIUM 8.6-50 MG PO TABS
2.0000 | ORAL_TABLET | Freq: Two times a day (BID) | ORAL | Status: DC
  Administered 2018-12-17 – 2018-12-20 (×6): 2 via ORAL
  Filled 2018-12-17 (×7): qty 2

## 2018-12-17 NOTE — Progress Notes (Signed)
    Dr. Caryl Comes unable to round on patient today. Telemetry reviewed with well controlled rates. Continue higher dose Cardizem that was titrated on 10/12. Recommend she follow up with Dr. Caryl Comes as an outpatient for further discussion. No new recs at this time.

## 2018-12-17 NOTE — Plan of Care (Signed)
  Problem: Clinical Measurements: Goal: Ability to maintain clinical measurements within normal limits will improve Outcome: Progressing   Problem: Clinical Measurements: Goal: Respiratory complications will improve Outcome: Progressing   Problem: Clinical Measurements: Goal: Cardiovascular complication will be avoided Outcome: Progressing   Problem: Safety: Goal: Ability to remain free from injury will improve Outcome: Progressing   Problem: Skin Integrity: Goal: Risk for impaired skin integrity will decrease Outcome: Progressing   Problem: Clinical Measurements: Goal: Ability to maintain a body temperature in the normal range will improve Outcome: Progressing

## 2018-12-17 NOTE — Progress Notes (Signed)
Pt voided in BSC 245mL at 0530. Post void bladder scan volume 673mL. On call, Ouma, NP, paged and verbal order given to do in and out cath Q6 PRN if bladder scan volume greater than 555mL. In and out cath volume 712mL. Peri-care performed before and after in and out cath. Pt resting comfortably. Will continue to monitor.

## 2018-12-17 NOTE — Progress Notes (Signed)
[facility] [facility] [place] NAME: Falisa Marcucci    MR#:  [id number]  DATE OF BIRTH:  [birth year]  SUBJECTIVE:   Patient states she is feeling well today. She has no concerns this morning. She is breathing well. No chest pain or palpitations.  REVIEW OF SYSTEMS:  Review of Systems  Constitutional: Negative for chills and fever.  HENT: Negative for congestion and sore throat.   Eyes: Negative for blurred vision and double vision.  Respiratory: Negative for cough and shortness of breath.   Cardiovascular: Negative for chest pain, palpitations and leg swelling.  Gastrointestinal: Negative for nausea and vomiting.  Genitourinary: Negative for dysuria and urgency.  Musculoskeletal: Negative for back pain and neck pain.  Neurological: Negative for dizziness and headaches.  Psychiatric/Behavioral: Negative for depression. The patient is not nervous/anxious.    DRUG ALLERGIES:  No Known Allergies VITALS:  Blood pressure (!) 160/99, pulse 83, temperature 97.9 F (36.6 C), resp. rate 19, height 5\' 5"  (1.651 m), weight 62.5 kg, SpO2 98 %. PHYSICAL EXAMINATION:  Physical Exam  GENERAL:  Laying in the bed with no acute distress.  HEENT: Head atraumatic, normocephalic. Pupils equal, round, reactive to light and accommodation. No scleral icterus. Extraocular muscles intact. Oropharynx and nasopharynx clear.  NECK:  Supple, no jugular venous distention. No thyroid enlargement. LUNGS: + Crackles present in the right lung base. No use of accessory muscles of respiration.  CARDIOVASCULAR: Irregularly irregular rhythm, regular rate, S1, S2 normal. No murmurs, rubs, or gallops.  ABDOMEN: Soft, nontender, nondistended. Bowel sounds present.  EXTREMITIES: No pedal edema, cyanosis, or clubbing.  NEUROLOGIC: CN 2-12 intact, no focal deficits. 5/5 muscle strength throughout all extremities. Sensation intact throughout. Gait not checked.  PSYCHIATRIC: The patient is  alert and awake SKIN: No obvious rash, lesion, or ulcer.   LABORATORY PANEL:  Female CBC Recent Labs  Lab 12/14/18 0356  WBC 5.6  HGB 11.9*  HCT 37.0  PLT 207   ------------------------------------------------------------------------------------------------------------------ Chemistries  Recent Labs  Lab 12/12/18 0438  12/15/18 1006  NA 140   < > 141  K 3.1*   < > 3.5  CL 108   < > 105  CO2 23   < > 25  GLUCOSE 95   < > 104*  BUN 11   < > 16  CREATININE 0.52   < > 0.56  CALCIUM 8.6*   < > 9.2  MG 1.8  --   --    < > = values in this interval not displayed.   RADIOLOGY:  No results found. ASSESSMENT AND PLAN:   Right middle lobe pneumonia with small right pleural effusion- likely aspiration pneumonia.  Procalcitonin is negative, so doubt bacterial pneumonia. Likely aspiration -Continue IV Unasyn  Paroxysmal atrial fibrillation- initially with RVR, but heart rates have improved -Continue diltiazem 120 mg twice daily -Continue home eliquis -Patient will need to follow-up with Dr. Caryl Comes as an outpatient  Syncope- likely due to above -Recent ECHO and carotid dopplers were unremarkable -Cardiac monitoring  Recent large right MCA territory stroke- patient was just admitted from 9/30-10/5 -Continue aspirin and Eliquis -PT recommending HHPT  Urinary retention-noted at last admission -Foley catheter has been in for 10 days -Voiding trial performed yesterday and patient failed this- foley catheter replaced today -Will need to f/u with urology on discharge  Hypertension- BP overall well-controlled -Continue home lisinopril  Hyperlipidemia-stable -Continue home Crestor  Husband, Gwyndolyn Saxon, updated on the plan this morning in person  and again this afternoon via the phone.  All the records are reviewed and case discussed with Care Management/Social Worker. Management plans discussed with the patient, family and they are in agreement.  CODE STATUS: Full Code  TOTAL  TIME TAKING CARE OF THIS PATIENT: 35 minutes.   POSSIBLE D/C IN 1-2 DAYS, DEPENDING ON CLINICAL CONDITION.  Berna Spare Azlynn Mitnick M.D on 12/17/2018 at 2:57 PM  Between 7am to 6pm - Pager [phone]  After 6pm go to [email or web address] - Proofreader  [employer] [place] [facility]  Office  [phone]  CC: Primary care physician; Guadalupe Maple, MD  Note: This dictation was prepared with Dragon dictation along with smaller phrase technology. Any transcriptional errors that result from this process are unintentional.

## 2018-12-17 NOTE — Progress Notes (Signed)
Physical Therapy Treatment Patient Details Name: Norma Hill MRN: [id number] DOB: 1945-10-03 Today's Date: 12/17/2018    History of Present Illness 73 y.o. female with a past medical history of a-fib on elequis, HTN, CVA, CA. Pt has multiple recent admissions, most recent for pneumonia. CT from previous admission revealed subacute infarct in the R MCA and PCA.    PT Comments    Pt is progressing toward goals. Upon entry, pt is resting in bed, nearly asleep. Husband in room and says she is quite tired, but pt reports no pain. Pt is still having confusion that requires verbal and tactile cueing. This date, pt performs bed mobility, transfers, and ambulation with min assist/CGA and use of RW for support. Pt will continue to benefit from skilled PT to address deficits in strength/activity tolerance.     Follow Up Recommendations  Home health PT;Supervision/Assistance - 24 hour     Equipment Recommendations  Rolling walker with 5" wheels    Recommendations for Other Services       Precautions / Restrictions Precautions Precautions: Fall Restrictions Weight Bearing Restrictions: No    Mobility  Bed Mobility Overal bed mobility: Needs Assistance Bed Mobility: Supine to Sit     Supine to sit: Min assist     General bed mobility comments: Pt requires verbal cueing to bring legs to EOB and min assist from PT. Appears to have difficulty with sequencing  Transfers Overall transfer level: Needs assistance Equipment used: Rolling walker (2 wheeled) Transfers: Sit to/from Stand Sit to Stand: Min guard         General transfer comment: use of RW; able to perform with min guard  Ambulation/Gait Ambulation/Gait assistance: Min guard Gait Distance (Feet): 120 Feet Assistive device: Rolling walker (2 wheeled) Gait Pattern/deviations: Step-through pattern;Decreased stride length     General Gait Details: Pt amb using RW with slowed speed. Pt continues to state that she  feels more comfortable with the walker right now   Stairs             Wheelchair Mobility    Modified Rankin (Stroke Patients Only)       Balance Overall balance assessment: Needs assistance Sitting-balance support: Feet supported Sitting balance-Leahy Scale: Good     Standing balance support: Bilateral upper extremity supported Standing balance-Leahy Scale: Fair Standing balance comment: UE support for comfort and to prevent reaching                            Cognition Arousal/Alertness: Awake/alert Behavior During Therapy: WFL for tasks assessed/performed Overall Cognitive Status: Impaired/Different from baseline Area of Impairment: Orientation;Memory;Safety/judgement                                      Exercises Other Exercises Other Exercises: Supine, 15 reps, bilat: AP, QS, SLR, GS; PT supervision, some tactile and verbal cueing to continue performing exercises    General Comments General comments (skin integrity, edema, etc.): Pt continues to display flat-affect and is soft-spoken.      Pertinent Vitals/Pain Pain Assessment: No/denies pain Pain Score: 0-No pain    Home Living                      Prior Function            PT Goals (current goals can now be found in the care  plan section) Acute Rehab PT Goals Patient Stated Goal: to return home PT Goal Formulation: With patient/family Time For Goal Achievement: 12/27/18 Potential to Achieve Goals: Good Progress towards PT goals: Progressing toward goals    Frequency    Min 2X/week      PT Plan Current plan remains appropriate    Co-evaluation              AM-PAC PT "6 Clicks" Mobility   Outcome Measure  Help needed turning from your back to your side while in a flat bed without using bedrails?: None Help needed moving from lying on your back to sitting on the side of a flat bed without using bedrails?: A Little Help needed moving to and  from a bed to a chair (including a wheelchair)?: A Little Help needed standing up from a chair using your arms (e.g., wheelchair or bedside chair)?: A Little Help needed to walk in hospital room?: A Little Help needed climbing 3-5 steps with a railing? : A Little 6 Click Score: 19    End of Session Equipment Utilized During Treatment: Gait belt Activity Tolerance: Patient tolerated treatment well Patient left: in bed;with call bell/phone within reach;with bed alarm set;with family/visitor present   PT Visit Diagnosis: Other symptoms and signs involving the nervous system (R29.898);Muscle weakness (generalized) (M62.81)     Time: [id number] PT Time Calculation (min) (ACUTE ONLY): 27 min  Charges:                        Norma Hill, SPT    Norma Hill 12/17/2018, 4:58 PM

## 2018-12-17 NOTE — TOC Progression Note (Signed)
Transition of Care [facility]) - Progression Note    Patient Details  Name: Norma Hill MRN: [id number] Date of Birth: May 01, 1945  Transition of Care [facility]) CM/SW Contact  Elza Rafter, RN Phone Number: 12/17/2018, 4:03 PM  Clinical Narrative:   To patient's room; spouse at bedside.  He is very concerned about taking wife home.  He states he feels she is not ready to go home.  He is upset that Dr. Caryl Comes was not able to come to assess.  He fears that she will fall once home.  Does not qualify for SNF at this time as she walked 200 ft.  PT will be working with patient again today. Foley catheter was replaced today for urinary retention.  Will continue to follow closely and assist with discharge planning.      Expected Discharge Plan: [place] Barriers to Discharge: Continued Medical Work up  Expected Discharge Plan and Services Expected Discharge Plan: [place] Choice: [place] arrangements for the past 2 months: Single Family Home                 DME Arranged: N/A DME Agency: NA       HH Arranged: PT HH Agency: [place] Date [place]: 12/13/18 Time [place]: [id number] Representative spoke with at [place]: [place] Determinants of Health ([place]) Interventions    Readmission Risk Interventions No flowsheet data found.

## 2018-12-18 MED ORDER — TAMSULOSIN HCL 0.4 MG PO CAPS
0.4000 mg | ORAL_CAPSULE | Freq: Every day | ORAL | Status: DC
  Administered 2018-12-18 – 2018-12-20 (×3): 0.4 mg via ORAL
  Filled 2018-12-18 (×3): qty 1

## 2018-12-18 NOTE — NC FL2 (Signed)
[place] LEVEL OF CARE SCREENING TOOL     IDENTIFICATION  Patient Name: Norma Hill Birthdate: 11-11-1945 Sex: female Admission Date (Current Location): 12/11/2018  [place] and [place] Number:  Engineering geologist and Address:  [facility], [address], [place], [place] [address]      Provider Number: [id number]  Attending Physician Name and Address:  Sela Hua, MD  Relative Name and Phone Number:  Gwyndolyn Saxon [phone]    Current Level of Care: Hospital Recommended Level of Care: [place] Prior Approval Number:    Date Approved/Denied:   PASRR Number: [id number]  Discharge Plan: SNF    Current Diagnoses: Patient Active Problem List   Diagnosis Date Noted  . Pneumonia 12/11/2018  . CVA (cerebral vascular accident) ([place]) 12/04/2018  . Atrial fibrillation with RVR ([place]) 09/29/2018  . Pleural effusion 05/13/2017  . SOB (shortness of breath) 05/12/2017  . New onset atrial fibrillation ([place]) 02/09/2016  . Personal history of malignant neoplasm of breast   . Cancer ([place]) 10/04/2000    Orientation RESPIRATION BLADDER Height & Weight     Self, Time, Situation, Place  Normal Incontinent Weight: 59.4 kg Height:  5\' 5"  (165.1 cm)  BEHAVIORAL SYMPTOMS/MOOD NEUROLOGICAL BOWEL NUTRITION STATUS      Continent Diet(2GM sodium)  AMBULATORY STATUS COMMUNICATION OF NEEDS Skin   Limited Assist Verbally Normal                       Personal Care Assistance Level of Assistance  Bathing, Feeding, Dressing Bathing Assistance: Limited assistance Feeding assistance: Independent Dressing Assistance: Limited assistance     Functional Limitations Info  Sight, Hearing, Speech Sight Info: Adequate Hearing Info: Adequate Speech Info: Adequate    SPECIAL CARE FACTORS FREQUENCY  PT (By licensed PT), OT (By licensed OT)     PT Frequency: 5 x a week OT Frequency: 5 x a week             Contractures Contractures Info: Not present    Additional Factors Info  Code Status, Allergies Code Status Info: Full Allergies Info: NKA           Current Medications (12/18/2018):  This is the current hospital active medication list Current Facility-Administered Medications  Medication Dose Route Frequency Provider Last Rate Last Dose  . 0.9 %  sodium chloride infusion   Intravenous PRN Mayo, Pete Pelt, MD   Stopped at 12/17/18 1401  . acetaminophen (TYLENOL) tablet 650 mg  650 mg Oral Q6H PRN Mansy, Jan A, MD       Or  . acetaminophen (TYLENOL) suppository 650 mg  650 mg Rectal Q6H PRN Mansy, Jan A, MD      . acetaminophen (TYLENOL) tablet 500 mg  500 mg Oral QHS PRN Mayo, Pete Pelt, MD       And  . diphenhydrAMINE (BENADRYL) capsule 25 mg  25 mg Oral QHS PRN Mayo, Pete Pelt, MD      . apixaban Arne [place]) tablet 5 mg  5 mg Oral BID Mansy, Jan A, MD   5 mg at 12/18/18 0955  . aspirin EC tablet 81 mg  81 mg Oral Daily Mansy, Jan A, MD   81 mg at 12/18/18 0955  . Chlorhexidine Gluconate Cloth 2 % PADS 6 each  6 each Topical Daily Paraschos, Alexander, MD   6 each at 12/15/18 1006  . diltiazem (CARDIZEM CD) 24 hr capsule 120 mg  120 mg Oral  BID Deboraha Sprang, MD   120 mg at 12/18/18 0955  . feeding supplement (ENSURE ENLIVE) (ENSURE ENLIVE) liquid 237 mL  237 mL Oral BID BM Mayo, Pete Pelt, MD   237 mL at 12/17/18 1014  . guaiFENesin (MUCINEX) 12 hr tablet 600 mg  600 mg Oral BID Mansy, Jan A, MD   600 mg at 12/18/18 0955  . influenza vaccine adjuvanted (FLUAD) injection 0.5 mL  0.5 mL Intramuscular Tomorrow-1000 Hillary Bow, MD   Stopped at 12/14/18 [phone]  . ipratropium-albuterol (DUONEB) 0.5-2.5 (3) MG/3ML nebulizer solution 3 mL  3 mL Nebulization Q6H PRN Mayo, Pete Pelt, MD      . magnesium hydroxide (MILK OF MAGNESIA) suspension 30 mL  30 mL Oral Daily PRN Mansy, Jan A, MD      . multivitamin with minerals tablet 1 tablet  1 tablet Oral Daily Mayo, Pete Pelt, MD   1 tablet  at 12/18/18 0955  . ondansetron (ZOFRAN) tablet 4 mg  4 mg Oral Q6H PRN Mansy, Jan A, MD       Or  . ondansetron [facility]) injection 4 mg  4 mg Intravenous Q6H PRN Mansy, Jan A, MD      . polyethylene glycol (MIRALAX / GLYCOLAX) packet 17 g  17 g Oral BID Sela Hua, MD   17 g at 12/18/18 0955  . rosuvastatin (CRESTOR) tablet 10 mg  10 mg Oral Daily Mansy, Jan A, MD   10 mg at 12/17/18 1720  . senna-docusate (Senokot-S) tablet 2 tablet  2 tablet Oral BID Mayo, Pete Pelt, MD   2 tablet at 12/18/18 0955  . sodium chloride flush (NS) 0.9 % injection 10 mL  10 mL Intravenous Q12H Hillary Bow, MD   10 mL at 12/18/18 0956  . tamsulosin (FLOMAX) capsule 0.4 mg  0.4 mg Oral Daily Mayo, Pete Pelt, MD   0.4 mg at 12/18/18 0955  . traZODone (DESYREL) tablet 25 mg  25 mg Oral QHS PRN Mansy, Jan A, MD   25 mg at 12/13/18 2331     Discharge Medications: Please see discharge summary for a list of discharge medications.  Relevant Imaging Results:  Relevant Lab Results:   Additional Information [id number]  Elza Rafter, RN

## 2018-12-18 NOTE — TOC Progression Note (Signed)
Transition of Care [facility]) - Progression Note    Patient Details  Name: Zyniyah Carvajal MRN: [id number] Date of Birth: [birth year]  Transition of Care [facility]) CM/SW Contact  Elza Rafter, RN Phone Number: 12/18/2018, 3:01 PM  Clinical Narrative:   Patient walked 120 ft yesterday with PT and 200 ft the day before.  Spoke with patient and spouse about plan moving forward.  He still feels she is too weak to DC home.  I brought up STR again and he is going to consider it.  I will w/u patient for STR-they prefer [facility].        Expected Discharge Plan: [place] Barriers to Discharge: Continued Medical Work up  Expected Discharge Plan and Services Expected Discharge Plan: [place] Choice: [place] arrangements for the past 2 months: Single Family Home                 DME Arranged: N/A DME Agency: NA       HH Arranged: PT HH Agency: [place] Date [place]: 12/13/18 Time [place]: [id number] Representative spoke with at [place]: [place] Determinants of Health ([place]) Interventions    Readmission Risk Interventions No flowsheet data found.

## 2018-12-18 NOTE — Progress Notes (Signed)
Nutrition Follow Up Note   DOCUMENTATION CODES:   Not applicable  INTERVENTION:   Ensure Enlive po BID, each supplement provides 350 kcal and 20 grams of protein  MVI daily   2g sodium diet   NUTRITION DIAGNOSIS:   Inadequate oral intake related to acute illness as evidenced by per patient/family report.  GOAL:   Patient will meet greater than or equal to 90% of their needs  -progressing   MONITOR:   PO intake, Labs, Weight trends, I & O's, Skin, Supplement acceptance  ASSESSMENT:   73 y.o. female with a past medical history of atrial fibrillation, hypertension, on Eliquis, recent large CVA presents to the emergency department after syncopal event. Pt found to have HCAP  Pt with improved appetite and oral intake; pt eating 75-100% of meals and drinking Ensure. Per chart, pt down 11lbs since admit; this is likely r/t fluid changes. No BM since 10/11; recommend bowel regimen as needed per MD.    Medications reviewed and include: aspirin, MVI, miralax, senokot  Labs reviewed:   Diet Order:   Diet Order            Diet 2 gram sodium Room service appropriate? Yes; Fluid consistency: Thin  Diet effective now             EDUCATION NEEDS:   No education needs have been identified at this time  Skin:  Skin Assessment: Reviewed RN Assessment(ecchymosis)  Last BM:  10/11  Height:   Ht Readings from Last 1 Encounters:  12/12/18 5\' 5"  (1.651 m)    Weight:   Wt Readings from Last 1 Encounters:  12/18/18 59.4 kg    Ideal Body Weight:  56.8 kg  BMI:  Body mass index is 21.8 kg/m.  Estimated Nutritional Needs:   Kcal:  1400-1600kcal/day  Protein:  70-80g/day  Fluid:  >1.4L/day  Koleen Distance MS, RD, LDN Pager #- [phone] Office#- [phone] After Hours Pager: [phone]

## 2018-12-18 NOTE — Progress Notes (Signed)
Occupational Therapy Treatment Patient Details Name: Norma Hill MRN: [id number] DOB: Apr 30, 1945 Today's Date: 12/18/2018    History of present illness 73 y.o. female with a past medical history of a-fib on elequis, HTN, CVA, CA. Pt has multiple recent admissions, most recent for pneumonia. CT from previous admission revealed subacute infarct in the R MCA and PCA.   OT comments  Pt seen for OT tx this date to address ADL mobility and safety with self care performance. Pt demos decreased safety awareness with fxl mobility requiring MIN/MOD verbal/visual cues to avoid obstacles in pathway as well as CGA-Supv for safety with FWW. Pt requires CGA and MIN verbal cues for safe hand/foot placement with commode transfer and MIN A for clothing mgt. Pt demos F+ static standing balance with FWW and G static sitting. Overall, HHOT d/c dispo remains appropriate.    Follow Up Recommendations  Home health OT;Supervision/Assistance - 24 hour    Equipment Recommendations  None recommended by OT    Recommendations for Other Services      Precautions / Restrictions Precautions Precautions: Fall Restrictions Weight Bearing Restrictions: No       Mobility Bed Mobility Overal bed mobility: Needs Assistance Bed Mobility: Supine to Sit     Supine to sit: Supervision     General bed mobility comments: MIN verbal cues to sequence task.  Transfers Overall transfer level: Needs assistance Equipment used: Rolling walker (2 wheeled) Transfers: Sit to/from Stand Sit to Stand: Min guard         General transfer comment: requires MIN verbal/tactile cues for safe hand/foot placement.    Balance Overall balance assessment: Needs assistance Sitting-balance support: Feet supported Sitting balance-Leahy Scale: Good Sitting balance - Comments: no use of UEs to maintain static sitting, use of 1 UE/alteranting for stability with more dynamic sitting tasks.   Standing balance support: Bilateral  upper extremity supported Standing balance-Leahy Scale: Fair Standing balance comment: b/l UE support through [place]                           ADL either performed or assessed with clinical judgement   ADL Overall ADL's : Needs assistance/impaired     Grooming: Oral care;Standing;Min guard Grooming Details (indicate cue type and reason): min guard for safety                 Toilet Transfer: Nature conservation officer;Ambulation;RW   Toileting- Clothing Manipulation and Hygiene: Minimal assistance;Sit to/from stand       Functional mobility during ADLs: Min guard;Rolling walker(requires MIN/MOD verbal/visual cues for obstacles/fall hazards in her pathway during fxl mobility, demos some decreasd safety awareness.)       Vision Patient Visual Report: No change from baseline     Perception     Praxis      Cognition Arousal/Alertness: Awake/alert Behavior During Therapy: WFL for tasks assessed/performed Overall Cognitive Status: Impaired/Different from baseline Area of Impairment: Orientation;Memory;Safety/judgement                 Orientation Level: Disoriented to;Time;Situation   Memory: Decreased short-term memory                  Exercises Other Exercises Other Exercises: OT facilitates education re: general fall prevention, use of call light. Pt verbalized understanding, but only demos moderate reception. Other Exercises: OT engages pt in 15 reps seated trunk extensions to encourage chest wall mobilization and optimize lung expansion. Pt able to tolerate well, requires  MIN verbal cues to sequence.   Shoulder Instructions       General Comments      Pertinent Vitals/ Pain       Pain Assessment: No/denies pain  Home Living Family/patient expects to be discharged to:: Private residence Living Arrangements: Spouse/significant other Available Help at Discharge: Family;Available 24 hours/day Type of Home: House Home Access: Stairs to  enter [employer] of Steps: 1 Entrance Stairs-Rails: None Home Layout: One level                          Prior Functioning/Environment              Frequency  Min 2X/week        Progress Toward Goals  OT Goals(current goals can now be found in the care plan section)  Progress towards OT goals: Progressing toward goals  Acute Rehab OT Goals Patient Stated Goal: to return home OT Goal Formulation: With patient/family Time For Goal Achievement: 12/28/18 Potential to Achieve Goals: Good  Plan Discharge plan remains appropriate    Co-evaluation                 AM-PAC OT "6 Clicks" Daily Activity     Outcome Measure   Help from another person eating meals?: A Little Help from another person taking care of personal grooming?: A Little Help from another person toileting, which includes using toliet, bedpan, or urinal?: A Little Help from another person bathing (including washing, rinsing, drying)?: A Little Help from another person to put on and taking off regular upper body clothing?: A Little Help from another person to put on and taking off regular lower body clothing?: A Little 6 Click Score: 18    End of Session Equipment Utilized During Treatment: Gait belt;Rolling walker  OT Visit Diagnosis: Muscle weakness (generalized) (M62.81);Other abnormalities of gait and mobility (R26.89);Other symptoms and signs involving cognitive function   Activity Tolerance Patient tolerated treatment well   Patient Left with chair alarm set;in bed;with bed alarm set;with call bell/phone within reach   Nurse Communication          Time: 1715-1753 OT Time Calculation (min): 38 min  Charges: OT General Charges $OT Visit: 1 Visit OT Treatments $Self Care/Home Management : 23-37 mins $Therapeutic Activity: 8-22 mins  Gerrianne Scale, MS, OTR/L ascom [phone] or [phone] 12/18/18, 6:12 PM

## 2018-12-18 NOTE — Progress Notes (Signed)
Received call from [place] regarding pt's HR up to 140.  Currently 120.  Pt assessed and is asymptomatic with NAD.

## 2018-12-18 NOTE — Progress Notes (Signed)
Rehab Admissions Coordinator Note:  Per request from [place], this patient was screened by Raechel Ache for appropriateness for an Inpatient Acute Rehab Consult.  At this time, pt is functionally too high level to qualify for an IP Rehab stay as she is Supervision/Min G for ambulation. Will not pursue CIR for this patient. CM Jennifer aware.    Raechel Ache 12/18/2018, 12:14 PM  I can be reached at [phone].

## 2018-12-18 NOTE — Progress Notes (Signed)
[place] at [place] NAME: Norma Hill    MR#:  [id number]  DATE OF BIRTH:  1945-07-29  SUBJECTIVE:   Patient states she is feeling well this morning.  She denies any chest pain, shortness of breath, or palpitations.  Per husband at bedside, patient is absolutely not able to go home today because she is still too weak.  REVIEW OF SYSTEMS:  Review of Systems  Constitutional: Negative for chills and fever.  HENT: Negative for congestion and sore throat.   Eyes: Negative for blurred vision and double vision.  Respiratory: Negative for cough and shortness of breath.   Cardiovascular: Negative for chest pain, palpitations and leg swelling.  Gastrointestinal: Negative for nausea and vomiting.  Genitourinary: Negative for dysuria and urgency.  Musculoskeletal: Negative for back pain and neck pain.  Neurological: Negative for dizziness and headaches.  Psychiatric/Behavioral: Negative for depression. The patient is not nervous/anxious.    DRUG ALLERGIES:  No Known Allergies VITALS:  Blood pressure (!) 149/72, pulse 97, temperature 97.8 F (36.6 C), temperature source Oral, resp. rate 18, height 5\' 5"  (1.651 m), weight 59.4 kg, SpO2 97 %. PHYSICAL EXAMINATION:  Physical Exam  GENERAL:  Laying in the bed with no acute distress.  HEENT: Head atraumatic, normocephalic. Pupils equal, round, reactive to light and accommodation. No scleral icterus. Extraocular muscles intact. Oropharynx and nasopharynx clear.  NECK:  Supple, no jugular venous distention. No thyroid enlargement. LUNGS: + Crackles present in the right lung base. No use of accessory muscles of respiration.  CARDIOVASCULAR: Irregularly irregular rhythm, regular rate, S1, S2 normal. No murmurs, rubs, or gallops.  ABDOMEN: Soft, nontender, nondistended. Bowel sounds present.  EXTREMITIES: No pedal edema, cyanosis, or clubbing.  NEUROLOGIC: CN 2-12 intact, no focal deficits. 5/5 muscle  strength throughout all extremities. Sensation intact throughout. Gait not checked.  PSYCHIATRIC: The patient is alert and awake SKIN: No obvious rash, lesion, or ulcer.   LABORATORY PANEL:  Female CBC Recent Labs  Lab 12/14/18 0356  WBC 5.6  HGB 11.9*  HCT 37.0  PLT 207   ------------------------------------------------------------------------------------------------------------------ Chemistries  Recent Labs  Lab 12/12/18 0438  12/15/18 1006  NA 140   < > 141  K 3.1*   < > 3.5  CL 108   < > 105  CO2 23   < > 25  GLUCOSE 95   < > 104*  BUN 11   < > 16  CREATININE 0.52   < > 0.56  CALCIUM 8.6*   < > 9.2  MG 1.8  --   --    < > = values in this interval not displayed.   RADIOLOGY:  No results found. ASSESSMENT AND PLAN:   Aspiration pneumonia-resolved -Patient will finish a 7-day course of Unasyn today  Paroxysmal atrial fibrillation- initially with RVR, but heart rates have improved -Continue diltiazem 120 mg twice daily -Continue home eliquis -Patient will need to follow-up with Dr. Caryl Comes as an outpatient  Syncope- likely due to above -Recent ECHO and carotid dopplers were unremarkable -Cardiac monitoring  Recent large right MCA territory stroke- patient was just admitted from 9/30-10/5 -Continue aspirin and Eliquis -PT recommending HHPT   Urinary retention-noted at last admission -Foley catheter has been in for 10 days -Voiding trial performed 10/12 and patient failed this- foley catheter replaced today -Will need to f/u with urology on discharge  Hypertension- BP overall well-controlled -Continue home lisinopril  Hyperlipidemia-stable -Continue home Crestor  Husband, Gwyndolyn Saxon, was  at bedside and was updated on the plan.  All the records are reviewed and case discussed with Care Management/Social Worker. Management plans discussed with the patient, family and they are in agreement.  CODE STATUS: Full Code  TOTAL TIME TAKING CARE OF THIS PATIENT: 35  minutes.   POSSIBLE D/C IN 1-2 DAYS, DEPENDING ON CLINICAL CONDITION.  Berna Spare Mayo M.D on 12/18/2018 at 1:58 PM  Between 7am to 6pm - Pager - [phone]  After 6pm go to [email or web address] - Proofreader  [facility]  Office  [phone]  CC: Primary care physician; Guadalupe Maple, MD  Note: This dictation was prepared with Dragon dictation along with smaller phrase technology. Any transcriptional errors that result from this process are unintentional.

## 2018-12-18 NOTE — Plan of Care (Signed)
  Problem: Education: Goal: Knowledge of General Education information will improve Description: Including pain rating scale, medication(s)/side effects and non-pharmacologic comfort measures Outcome: Progressing   Problem: Health Behavior/Discharge Planning: Goal: Ability to manage health-related needs will improve Outcome: Progressing   Problem: Clinical Measurements: Goal: Ability to maintain clinical measurements within normal limits will improve Outcome: Progressing Goal: Will remain free from infection Outcome: Progressing Goal: Diagnostic test results will improve Outcome: Progressing Goal: Respiratory complications will improve Outcome: Progressing Goal: Cardiovascular complication will be avoided Outcome: Progressing   Problem: Activity: Goal: Risk for activity intolerance will decrease Outcome: Progressing   Problem: Nutrition: Goal: Adequate nutrition will be maintained Outcome: Progressing   Problem: Coping: Goal: Level of anxiety will decrease Outcome: Progressing   Problem: Elimination: Goal: Will not experience complications related to bowel motility Outcome: Progressing Goal: Will not experience complications related to urinary retention Outcome: Progressing   Problem: Safety: Goal: Ability to remain free from injury will improve Outcome: Progressing   Problem: Skin Integrity: Goal: Risk for impaired skin integrity will decrease Outcome: Progressing   Problem: Clinical Measurements: Goal: Ability to maintain a body temperature in the normal range will improve Outcome: Progressing   Problem: Respiratory: Goal: Ability to maintain adequate ventilation will improve Outcome: Progressing Goal: Ability to maintain a clear airway will improve Outcome: Progressing

## 2018-12-18 NOTE — Progress Notes (Signed)
    Ventricular rates remain well controlled on higher dose Cardizem. No significant pauses or malignant arrhythmias. No further syncope. Recommend she follow up either with [facility] or Dr. Caryl Comes at discharge. No new recommendations.

## 2018-12-19 DIAGNOSIS — R55 Syncope and collapse: Secondary | ICD-10-CM

## 2018-12-19 DIAGNOSIS — Z8673 Personal history of transient ischemic attack (TIA), and cerebral infarction without residual deficits: Secondary | ICD-10-CM

## 2018-12-19 DIAGNOSIS — F0390 Unspecified dementia without behavioral disturbance: Secondary | ICD-10-CM

## 2018-12-19 DIAGNOSIS — F0280 Dementia in other diseases classified elsewhere without behavioral disturbance: Secondary | ICD-10-CM

## 2018-12-19 DIAGNOSIS — G301 Alzheimer's disease with late onset: Secondary | ICD-10-CM

## 2018-12-19 LAB — CBC
HCT: 38.7 % (ref 36.0–46.0)
Hemoglobin: 12.3 g/dL (ref 12.0–15.0)
MCH: 27.2 pg (ref 26.0–34.0)
MCHC: 31.8 g/dL (ref 30.0–36.0)
MCV: 85.6 fL (ref 80.0–100.0)
Platelets: 220 10*3/uL (ref 150–400)
RBC: 4.52 MIL/uL (ref 3.87–5.11)
RDW: 15 % (ref 11.5–15.5)
WBC: 5.9 10*3/uL (ref 4.0–10.5)
nRBC: 0 % (ref 0.0–0.2)

## 2018-12-19 MED ORDER — METOPROLOL SUCCINATE ER 25 MG PO TB24
25.0000 mg | ORAL_TABLET | Freq: Every day | ORAL | Status: DC
  Administered 2018-12-19 – 2018-12-20 (×2): 25 mg via ORAL
  Filled 2018-12-19 (×2): qty 1

## 2018-12-19 MED ORDER — DILTIAZEM HCL ER COATED BEADS 180 MG PO CP24
180.0000 mg | ORAL_CAPSULE | Freq: Two times a day (BID) | ORAL | Status: DC
  Administered 2018-12-19 – 2018-12-20 (×2): 180 mg via ORAL
  Filled 2018-12-19 (×2): qty 1

## 2018-12-19 MED ORDER — DILTIAZEM HCL 30 MG PO TABS
60.0000 mg | ORAL_TABLET | Freq: Once | ORAL | Status: AC
  Administered 2018-12-19: 60 mg via ORAL
  Filled 2018-12-19: qty 2

## 2018-12-19 NOTE — Care Management Important Message (Signed)
Important Message  Patient Details  Name: Winta Spengler MRN: [id number] Date of Birth: 1945-10-13   Medicare Important Message Given:  Yes     Dannette Barbara 12/19/2018, 2:12 PM

## 2018-12-19 NOTE — Progress Notes (Signed)
Physical Therapy Treatment Patient Details Name: Norma Hill MRN: [id number] DOB: Apr 02, 1945 Today's Date: 12/19/2018    History of Present Illness 73 y.o. female with a past medical history of a-fib on elequis, HTN, CVA, CA. Pt has multiple recent admissions, most recent for pneumonia. CT from previous admission revealed subacute infarct in the R MCA and PCA.    PT Comments    Pt is progressing slowly toward goals. Upon entry, pt is resting in bed; does not report pain. This date, pt performs bed mobility with min assist and significant verbal cueing. Transfers and ambulation performed with CGA and use of RW, however resulted in significant increase in HR to 160 bpm. Pt ambulation distance has decreased over the last couple of sessions due to symptoms of fatigue reported by pt. Confusion persists and appears increased today as evidenced by disorientation, and cueing to remind her what exercise she was performing and reminder of the catheter. Pt will continue to benefit from skilled PT to address deficits described. Current recommendation for follow-up care is now SNF due to mobility status and concerns with safety due to increases in HR.   Follow Up Recommendations  SNF     Equipment Recommendations  Rolling walker with 5" wheels    Recommendations for Other Services       Precautions / Restrictions Precautions Precautions: Fall Restrictions Weight Bearing Restrictions: No    Mobility  Bed Mobility Overal bed mobility: Needs Assistance Bed Mobility: Supine to Sit     Supine to sit: Min assist     General bed mobility comments: Pt requires significant verbal cueing to sequence tasks and min assist to bring LE's to EOB  Transfers Overall transfer level: Needs assistance Equipment used: Rolling walker (2 wheeled) Transfers: Sit to/from Stand Sit to Stand: Min guard         General transfer comment: Min guard provided, demonstrates safe hand placement.    Ambulation/Gait Ambulation/Gait assistance: Min guard Gait Distance (Feet): 15 Feet Assistive device: Rolling walker (2 wheeled) Gait Pattern/deviations: Step-through pattern;Decreased stride length     General Gait Details: Pt amb slowly using RW. No LOB's noted, however HR during amb escalated to 160 bpm and pt reports no symptoms other than feeling tired/exerted.   Stairs             Wheelchair Mobility    Modified Rankin (Stroke Patients Only)       Balance Overall balance assessment: Needs assistance Sitting-balance support: Feet supported Sitting balance-Leahy Scale: Good     Standing balance support: Bilateral upper extremity supported Standing balance-Leahy Scale: Good                              Cognition Arousal/Alertness: Awake/alert Behavior During Therapy: WFL for tasks assessed/performed Overall Cognitive Status: Impaired/Different from baseline Area of Impairment: Orientation;Memory;Safety/judgement                 Orientation Level: Disoriented to;Time;Situation   Memory: Decreased short-term memory         General Comments: Pt is oriented to self but not time; pauses during exercise periodically and when prompted to continue asks what she was doing. Pt also mentioned feeling like she needed to go to the restroom when she has a catheter in place      Exercises Other Exercises Other Exercises: 5x STS-- 25 seconds; HR escalated to 150 and pt reports exertion Other Exercises: Seated EOB, 10 repetitions, bilateral:  reaching task to challenge core/balance, seated march Other Exercises: Supine, 10 reps, bilateral: SLR, QS, AP    General Comments General comments (skin integrity, edema, etc.): Pt continues to be disoriented to time/situation and requires significant verbal cueing throughout tasks. Reminders given that she has a catheter. On first stand to attempt amb, pt reports feeling "faint". Sx resolved in sitting and pt  was able to perform short bout of amb.       Pertinent Vitals/Pain Pain Assessment: No/denies pain    Home Living                      Prior Function            PT Goals (current goals can now be found in the care plan section) Acute Rehab PT Goals Patient Stated Goal: to return home PT Goal Formulation: With patient/family Time For Goal Achievement: 12/27/18 Potential to Achieve Goals: Good Progress towards PT goals: Progressing toward goals    Frequency    Min 2X/week      PT Plan Discharge plan needs to be updated    Co-evaluation              AM-PAC PT "6 Clicks" Mobility   Outcome Measure  Help needed turning from your back to your side while in a flat bed without using bedrails?: A Little Help needed moving from lying on your back to sitting on the side of a flat bed without using bedrails?: A Little Help needed moving to and from a bed to a chair (including a wheelchair)?: A Little Help needed standing up from a chair using your arms (e.g., wheelchair or bedside chair)?: A Little Help needed to walk in hospital room?: A Little Help needed climbing 3-5 steps with a railing? : A Lot 6 Click Score: 17    End of Session Equipment Utilized During Treatment: Gait belt Activity Tolerance: Patient limited by fatigue Patient left: in chair;with call bell/phone within reach;with chair alarm set Nurse Communication: Mobility status(RN notified of HR escalation throughout tx) PT Visit Diagnosis: Other symptoms and signs involving the nervous system (R29.898);Muscle weakness (generalized) (M62.81);Unsteadiness on feet (R26.81)     Time: [id number] PT Time Calculation (min) (ACUTE ONLY): 28 min  Charges:  $Gait Training: 8-22 mins $Therapeutic Exercise: 8-22 mins                     Norma Hill, SPT    Norma Hill 12/19/2018, 12:26 PM

## 2018-12-19 NOTE — Progress Notes (Signed)
Patient ID: Norma Hill, female   DOB: [birth year], 73 y.o.   MRN: [id number]  [employer] PROGRESS NOTE  Norma Hill [id number] DOB: 06-Nov-1945 DOA: 12/11/2018 PCP: Guadalupe Maple, MD  HPI/Subjective: Patient feels okay.  Husband states that she is not eating very much.  Heart rate today went up to 140.  Objective: Vitals:   12/19/18 0803 12/19/18 1219  BP: (!) 141/91   Pulse: (!) 102 91  Resp: 18   Temp: 98 F (36.7 C)   SpO2: 93% 97%    Filed Weights   12/17/18 0414 12/18/18 0422 12/19/18 0318  Weight: 62.5 kg 59.4 kg 60.6 kg    ROS: Review of Systems  Constitutional: Negative for chills and fever.  Eyes: Negative for blurred vision.  Respiratory: Negative for cough and shortness of breath.   Cardiovascular: Negative for chest pain.  Gastrointestinal: Negative for abdominal pain, constipation, diarrhea, nausea and vomiting.  Genitourinary: Negative for dysuria.  Musculoskeletal: Negative for joint pain.  Neurological: Negative for dizziness and headaches.   Exam: Physical Exam  Constitutional: She is oriented to person, place, and time.  HENT:  Nose: No mucosal edema.  Mouth/Throat: No oropharyngeal exudate or posterior oropharyngeal edema.  Eyes: Pupils are equal, round, and reactive to light. Conjunctivae, EOM and lids are normal.  Neck: No JVD present. Carotid bruit is not present. No edema present. No thyroid mass and no thyromegaly present.  Cardiovascular: S1 normal and S2 normal. An irregularly irregular rhythm present. Tachycardia present. Exam reveals no gallop.  No murmur heard. Pulses:      Dorsalis pedis pulses are 2+ on the right side and 2+ on the left side.  Respiratory: No respiratory distress. She has decreased breath sounds in the right lower field and the left lower field. She has no wheezes. She has no rhonchi. She has no rales.  GI: Soft. Bowel sounds are normal. There is no abdominal tenderness.  Musculoskeletal:      Right ankle: She exhibits no swelling.     Left ankle: She exhibits no swelling.  Lymphadenopathy:    She has no cervical adenopathy.  Neurological: She is alert and oriented to person, place, and time. No cranial nerve deficit.  Skin: Skin is warm. No rash noted. Nails show no clubbing.  Psychiatric: She has a normal mood and affect.      Data Reviewed: Basic Metabolic Panel: Recent Labs  Lab 12/13/18 0548 12/14/18 0356 12/15/18 1006  NA 140 141 141  K 3.8 3.3* 3.5  CL 111 107 105  CO2 26 24 25   GLUCOSE 103* 91 104*  BUN 12 10 16   CREATININE 0.52 0.49 0.56  CALCIUM 8.7* 8.7* 9.2   CBC: Recent Labs  Lab 12/13/18 0548 12/14/18 0356 12/19/18 0519  WBC 5.7 5.6 5.9  NEUTROABS  --  2.9  --   HGB 11.9* 11.9* 12.3  HCT 37.4 37.0 38.7  MCV 88.0 86.4 85.6  PLT 201 207 220   BNP (last 3 results) Recent Labs    09/29/18 2016  BNP 988.0*      Recent Results (from the past 240 hour(s))  SARS Coronavirus 2 [facility] order, Performed in [facility] hospital lab) Nasopharyngeal Nasopharyngeal Swab     Status: None   Collection Time: 12/11/18 10:04 PM   Specimen: Nasopharyngeal Swab  Result Value Ref Range Status   SARS Coronavirus 2 NEGATIVE NEGATIVE Final    Comment: (NOTE) If result is NEGATIVE SARS-CoV-2 target nucleic acids are NOT DETECTED.  The SARS-CoV-2 RNA is generally detectable in upper and lower  respiratory specimens during the acute phase of infection. The lowest  concentration of SARS-CoV-2 viral copies this assay can detect is 250  copies / mL. A negative result does not preclude SARS-CoV-2 infection  and should not be used as the sole basis for treatment or other  patient management decisions.  A negative result may occur with  improper specimen collection / handling, submission of specimen other  than nasopharyngeal swab, presence of viral mutation(s) within the  areas targeted by this assay, and inadequate number of viral copies  (<250 copies /  mL). A negative result must be combined with clinical  observations, patient history, and epidemiological information. If result is POSITIVE SARS-CoV-2 target nucleic acids are DETECTED. The SARS-CoV-2 RNA is generally detectable in upper and lower  respiratory specimens dur ing the acute phase of infection.  Positive  results are indicative of active infection with SARS-CoV-2.  Clinical  correlation with patient history and other diagnostic information is  necessary to determine patient infection status.  Positive results do  not rule out bacterial infection or co-infection with other viruses. If result is PRESUMPTIVE POSTIVE SARS-CoV-2 nucleic acids MAY BE PRESENT.   A presumptive positive result was obtained on the submitted specimen  and confirmed on repeat testing.  While 2019 novel coronavirus  (SARS-CoV-2) nucleic acids may be present in the submitted sample  additional confirmatory testing may be necessary for epidemiological  and / or clinical management purposes  to differentiate between  SARS-CoV-2 and other Sarbecovirus currently known to infect humans.  If clinically indicated additional testing with an alternate test  methodology [phone]) is advised. The SARS-CoV-2 RNA is generally  detectable in upper and lower respiratory sp ecimens during the acute  phase of infection. The expected result is Negative. Fact Sheet for Patients:  StrictlyIdeas.no Fact Sheet for Healthcare Providers: BankingDealers.co.za This test is not yet approved or cleared by the [place] FDA and has been authorized for detection and/or diagnosis of SARS-CoV-2 by FDA under an Emergency Use Authorization (EUA).  This EUA will remain in effect (meaning this test can be used) for the duration of the COVID-19 declaration under Section 564(b)(1) of the Act, 21 U.S.C. section 360bbb-3(b)(1), unless the authorization is terminated or revoked  sooner. Performed at [facility], [address] [place], [place] [address]   Blood culture (routine x 2)     Status: None   Collection Time: 12/11/18 10:04 PM   Specimen: BLOOD  Result Value Ref Range Status   Specimen Description BLOOD RIGHT ANTECUBITAL  Final   Special Requests   Final    BOTTLES DRAWN AEROBIC AND ANAEROBIC Blood Culture adequate volume   Culture   Final    NO GROWTH 5 DAYS Performed at [facility], [address], [place], [place] [address]    Report Status 12/16/2018 FINAL  Final  Blood culture (routine x 2)     Status: None   Collection Time: 12/11/18 10:04 PM   Specimen: BLOOD  Result Value Ref Range Status   Specimen Description BLOOD BLOOD RIGHT FOREARM  Final   Special Requests   Final    BOTTLES DRAWN AEROBIC AND ANAEROBIC Blood Culture adequate volume   Culture   Final    NO GROWTH 5 DAYS Performed at [facility], [address] [place], [place] [address]    Report Status 12/16/2018 FINAL  Final     Scheduled Meds: . apixaban  5 mg Oral BID  . aspirin EC  81 mg Oral Daily  . Chlorhexidine Gluconate Cloth  6 each Topical Daily  . diltiazem  180 mg Oral BID  . diltiazem  60 mg Oral Once  . feeding supplement (ENSURE ENLIVE)  237 mL Oral BID BM  . guaiFENesin  600 mg Oral BID  . influenza vaccine adjuvanted  0.5 mL Intramuscular Tomorrow-1000  . metoprolol succinate  25 mg Oral Daily  . multivitamin with minerals  1 tablet Oral Daily  . polyethylene glycol  17 g Oral BID  . rosuvastatin  10 mg Oral Daily  . senna-docusate  2 tablet Oral BID  . sodium chloride flush  10 mL Intravenous Q12H  . tamsulosin  0.4 mg Oral Daily   Continuous Infusions: . sodium chloride Stopped (12/17/18 1401)    Assessment/Plan:  1. Permanent atrial fibrillation with rapid ventricular response.  I added low dose metoprolol.  Cardiology increasing Cardizem CD up to 180 mg twice daily. 2. Aspiration pneumonia treated with a full  course of Unasyn 7 days.  Antibiotics discontinued yesterday. 3. Syncope.  Recent echo and carotid Dopplers unremarkable.  Check orthostatic vitals. 4. 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  Family agreeable to go to rehab at this point. 5. Urinary retention status post Foley catheter.  Patient will need urology as outpatient. 6. Hypertension.  On increased dose of Cardizem and low-dose metoprolol added. 7. Hyperlipidemia on Crestor  Code Status:     Code Status Orders  (From admission, onward)         Start     Ordered   12/11/18 2337  Full code  Continuous     12/11/18 2336        Code Status History    Date Active Date Inactive Code Status Order ID Comments User Context   12/04/2018 1445 12/09/2018 2154 Full Code [id number]  Bettey Costa, MD Inpatient   09/30/2018 0017 10/01/2018 1932 Full Code [id number]  Harrie Foreman, MD Inpatient   05/12/2017 2014 05/16/2017 1749 Full Code [id number]  Dustin Flock, MD Inpatient   02/10/2016 0052 02/11/2016 1603 Full Code [id number]  Hugelmeyer, Ubaldo Glassing, DO Inpatient   Advance Care Planning Activity     Family Communication: Husband at the bedside Disposition Plan: Will need better heart rate control prior to disposition  Consultants:  Cardiology  Time spent: 40 minutes, case discussed with cardiology in the room  [employer]

## 2018-12-19 NOTE — Progress Notes (Signed)
Progress Note  Patient Name: Norma Hill Date of Encounter: 12/19/2018  Primary Cardiologist:  followed by [facility] cardiology  Subjective   Patient is minimally verbal, no complaints Husband at the bedside, long discussion with him concerning her history of syncope, symptoms leading to presentation  -He is concerned about urinary tract infection, concerned about her overall health Reports having stress at home trying to take care of her, Other family member helping is a former hospice nurse who is 75 years old.  2 of them having trouble trying to bathe her and do her ADLs.  Leg is weaker, progressive decline over the past 6 months in terms of her cognitive function  Inpatient Medications    Scheduled Meds: . apixaban  5 mg Oral BID  . aspirin EC  81 mg Oral Daily  . Chlorhexidine Gluconate Cloth  6 each Topical Daily  . diltiazem  180 mg Oral BID  . diltiazem  60 mg Oral Once  . feeding supplement (ENSURE ENLIVE)  237 mL Oral BID BM  . guaiFENesin  600 mg Oral BID  . influenza vaccine adjuvanted  0.5 mL Intramuscular Tomorrow-1000  . metoprolol succinate  25 mg Oral Daily  . multivitamin with minerals  1 tablet Oral Daily  . polyethylene glycol  17 g Oral BID  . rosuvastatin  10 mg Oral Daily  . senna-docusate  2 tablet Oral BID  . sodium chloride flush  10 mL Intravenous Q12H  . tamsulosin  0.4 mg Oral Daily   Continuous Infusions: . sodium chloride Stopped (12/17/18 1401)   PRN Meds: sodium chloride, acetaminophen **OR** acetaminophen, diphenhydrAMINE **AND** acetaminophen, ipratropium-albuterol, magnesium hydroxide, ondansetron **OR** ondansetron (ZOFRAN) IV, traZODone   Vital Signs    Vitals:   12/18/18 1923 12/19/18 0318 12/19/18 0803 12/19/18 1219  BP: (!) 141/74 130/79 (!) 141/91   Pulse: (!) 106 98 (!) 102 91  Resp: 16 16 18    Temp: 97.6 F (36.4 C) 98.2 F (36.8 C) 98 F (36.7 C)   TempSrc: Oral Oral Oral   SpO2: 94% 95% 93% 97%  Weight:  60.6 kg     Height:        Intake/Output Summary (Last 24 hours) at 12/19/2018 1320 Last data filed at 12/19/2018 1002 Gross per 24 hour  Intake 600 ml  Output 2050 ml  Net -1450 ml   Last 3 Weights 12/19/2018 12/18/2018 12/17/2018  Weight (lbs) 133 lb 9.6 oz 131 lb 137 lb 11.2 oz  Weight (kg) 60.6 kg 59.421 kg 62.46 kg      Telemetry    Atrial fibrillation rate 90-100 up to 140-150 s with walking- Personally Reviewed  ECG     - Personally Reviewed  Physical Exam   GEN: No acute distress.   Neck: No JVD Cardiac:  Irregularly irregular, no murmurs, rubs, or gallops.  Respiratory: Clear to auscultation bilaterally. GI: Soft, nontender, non-distended  MS: No edema; No deformity. Neuro:  Nonfocal  Psych: Flat affect, will answer with one-word answers when asked a question  Labs    High Sensitivity Troponin:   Recent Labs  Lab 12/04/18 1128 12/04/18 1458 12/11/18 2204 12/12/18 0438 12/12/18 0940  TROPONINIHS 16 16 10 9 10       Chemistry Recent Labs  Lab 12/13/18 0548 12/14/18 0356 12/15/18 1006  NA 140 141 141  K 3.8 3.3* 3.5  CL 111 107 105  CO2 26 24 25   GLUCOSE 103* 91 104*  BUN 12 10 16   CREATININE 0.52 0.49  0.56  CALCIUM 8.7* 8.7* 9.2  GFRNONAA >60 >60 >60  GFRAA >60 >60 >60  ANIONGAP 3* 10 11     Hematology Recent Labs  Lab 12/13/18 0548 12/14/18 0356 12/19/18 0519  WBC 5.7 5.6 5.9  RBC 4.25 4.28 4.52  HGB 11.9* 11.9* 12.3  HCT 37.4 37.0 38.7  MCV 88.0 86.4 85.6  MCH 28.0 27.8 27.2  MCHC 31.8 32.2 31.8  RDW 15.2 15.3 15.0  PLT 201 207 220    BNPNo results for input(s): BNP, PROBNP in the last 168 hours.   DDimer No results for input(s): DDIMER in the last 168 hours.   Radiology    No results found.  Cardiac Studies  Echocardiogram December 04, 2018  1. Left ventricular ejection fraction, by visual estimation, is 40 to 45%. The left ventricle has mildly decreased function. Left ventricular septal wall thickness was mildly increased.  Mildly increased left ventricular posterior wall thickness. There  is mildly increased left ventricular hypertrophy.  2. Global right ventricle has normal systolic function.The right ventricular size is mildly enlarged. No increase in right ventricular wall thickness.  3. Left atrial size was mildly dilated.  4. Right atrial size was moderately dilated.  5. The mitral valve is grossly normal. Trace mitral valve regurgitation.  6. The tricuspid valve is grossly normal. Tricuspid valve regurgitation is mild.  7. The aortic valve is grossly normal Aortic valve regurgitation was not visualized by color flow Doppler.  8. The pulmonic valve was not well visualized. Pulmonic valve regurgitation is trivial by color flow Doppler.  9. Aortic dilatation noted. 10. There is mild dilatation of the aortic root measuring 3.5 cm mm. 11. Mildly elevated pulmonary artery systolic pressure. 12. The interatrial septum was not assessed.  Head MRI December 04, 2018 Multifocal intracranial arterial stenoses, presumably on the basis of atherosclerotic disease, most notably as follows. --Apparent moderate stenosis within the distal M1 right MCA and within an adjacent proximal inferior division M2 branch. --Apparent high-grade narrowing of the petrous left ICA at the  level of the skull base. However, this stenosis may be overestimated  due to artifact arising from the skull base. --Two focal stenosis within the cavernous left ICA, up to moderate  in severity. --Apparent at least moderate stenosis within the proximal left M2 superior division. --Moderate/severe stenosis within the P1 left PCA. Moderate focal stenosis within the P2 left PCA. There is also suggestion of  significant atherosclerotic irregularity within the distal P2 and  more distal left PCA.   Patient Profile     73 y.o. female   Assessment & Plan    A/P: Permanent atrial fibrillation s/p cryo-ablation in 2018 with recurrence of arrhythmia  CHADS2VASc markedly elevated (HTN, age x 2, stroke x 2, female) --On numerous EKGs, poorly controlled rate dating back several years -She arrived to the hospital not on any rate controlling medication as this was discontinued after recent stroke -Prior to that was on diltiazem 360 mg extended release tab --Hospital admission July 2020, some noncompliance with her diltiazem -This admission diltiazem has been titrated upwards initially 120, then up to 120 twice daily.  Metoprolol added this morning still with elevated rate on any movement.  Rate adequate at rest -Would continue Eliquis given recent stroke.  Neurology also previously added aspirin with her Eliquis -----Diltiazem increased up to 180 mg extended release twice daily, continue metoprolol As she is relatively asymptomatic and very sedentary, we will aim for better rate control with a lenient rate on  exertion. She will need to be able to work with PT at rehab  History of CVA/dementia right MCA territory infarct  Also with underlying dementia MRI reviewed with husband today Profound cerebral atherosclerotic plaque and regions of stenosis as detailed above -We will need rehab and follow-up with neurology for her dementia -Husband reports anorexia, general decline in her abilities, lack of interest in doing things that they typically love such as going out to dinner  Cardiomyopathy Prior echocardiogram February 2018 ejection fraction greater than 55% prior echocardiogram October 2019 [facility] (45 to 50%) Moderately dilated left atrium Ejection fraction now 40 to 45% Presumed to be tachycardia mediated Not a good candidate for ischemic work-up given underlying dementia  Syncope Long discussion with patient's husband,  several syncope episodes probably for at least 4 years or so, at least 4 episodes There was a Zio patch performed November 2019 showing 3-second pause Etiology of syncope is unclear,  She has been seen by EP Would  recommend follow-up with EP following discharge No evidence of orthostasis or hypotension this admission  Confusion/debility/dementia Follow-up with neurology   Total encounter time more than 35 minutes  Greater than 50% was spent in counseling and coordination of care with the patient    For questions or updates, please contact [place] Please consult [email or web address] for contact info under        Signed, Ida Rogue, MD  12/19/2018, 1:20 PM

## 2018-12-20 DIAGNOSIS — F0390 Unspecified dementia without behavioral disturbance: Secondary | ICD-10-CM

## 2018-12-20 DIAGNOSIS — I639 Cerebral infarction, unspecified: Secondary | ICD-10-CM

## 2018-12-20 LAB — BASIC METABOLIC PANEL
Anion gap: 7 (ref 5–15)
BUN: 24 mg/dL — ABNORMAL HIGH (ref 8–23)
CO2: 29 mmol/L (ref 22–32)
Calcium: 9.1 mg/dL (ref 8.9–10.3)
Chloride: 105 mmol/L (ref 98–111)
Creatinine, Ser: 0.66 mg/dL (ref 0.44–1.00)
GFR calc Af Amer: >60 mL/min (ref >60)
GFR calc non Af Amer: >60 mL/min (ref >60)
Glucose, Bld: 101 mg/dL — ABNORMAL HIGH (ref 70–99)
Potassium: 3.7 mmol/L (ref 3.5–5.1)
Sodium: 141 mmol/L (ref 135–145)

## 2018-12-20 MED ORDER — ACETAMINOPHEN 325 MG PO TABS: 650.0000 mg | ORAL_TABLET | Freq: Four times a day (QID) | ORAL | Status: AC | PRN

## 2018-12-20 MED ORDER — POLYETHYLENE GLYCOL 3350 17 G PO PACK: 17.0000 g | PACK | Freq: Two times a day (BID) | ORAL | 0 refills | Status: DC

## 2018-12-20 MED ORDER — DILTIAZEM HCL ER COATED BEADS 180 MG PO CP24: 180.0000 mg | ORAL_CAPSULE | Freq: Two times a day (BID) | ORAL | 0 refills | Status: DC

## 2018-12-20 MED ORDER — ADULT MULTIVITAMIN W/MINERALS CH: 1.0000 | ORAL_TABLET | Freq: Every day | ORAL | 0 refills | Status: DC

## 2018-12-20 MED ORDER — ENSURE ENLIVE PO LIQD: 237.0000 mL | Freq: Two times a day (BID) | ORAL | 0 refills | Status: DC

## 2018-12-20 MED ORDER — SENNOSIDES-DOCUSATE SODIUM 8.6-50 MG PO TABS: 2.0000 | ORAL_TABLET | Freq: Two times a day (BID) | ORAL | 0 refills | Status: DC

## 2018-12-20 MED ORDER — TAMSULOSIN HCL 0.4 MG PO CAPS: 0.4000 mg | ORAL_CAPSULE | Freq: Every day | ORAL | 0 refills | Status: DC

## 2018-12-20 MED ORDER — METOPROLOL SUCCINATE ER 25 MG PO TB24: 25.0000 mg | ORAL_TABLET | Freq: Every day | ORAL | 0 refills | Status: DC

## 2018-12-20 NOTE — TOC Transition Note (Signed)
Transition of Care [facility]) - CM/SW Discharge Note   Patient Details  Name: Norma Hill MRN: [id number] Date of Birth: [birth year]  Transition of Care [facility]) CM/SW Contact:  Elza Rafter, RN Phone Number: 12/20/2018, 10:47 AM   Clinical Narrative:   [facility] discharging [facility] today via [facility].  Room B14; RN can call report to main number [phone].  Spouse is at bedside and aware of discharge plan.    Final next level of care: Skilled Nursing Facility Barriers to Discharge: No Barriers Identified   Patient Goals and CMS Choice Patient states their goals for this hospitalization and ongoing recovery are:: To return back home with husband. CMS Medicare.gov Compare Post Acute Care list provided to:: Patient Choice offered to / list presented to : Patient  Discharge Placement              Patient chooses bed at: [facility] Patient to be transferred to facility by: EMS Name of family member notified: Amaryllis Dyke Patient and family notified of of transfer: 12/20/18  Discharge Plan and Services     Post Acute Care Choice: Home Health          DME Arranged: N/A DME Agency: NA       HH Arranged: PT [place] Agency: [place] Date [facility] Agency Contacted: 12/13/18 Time [place]: [id number] Representative spoke with at [place]: [place] ([place]) Interventions     Readmission Risk Interventions No flowsheet data found.

## 2018-12-20 NOTE — Discharge Instructions (Signed)
Atrial Fibrillation ° °Atrial fibrillation is a type of heartbeat that is irregular or fast (rapid). If you have this condition, your heart beats without any order. This makes it hard for your heart to pump blood in a normal way. Having this condition gives you more risk for stroke, heart failure, and other heart problems. °Atrial fibrillation may start all of a sudden and then stop on its own, or it may become a long-lasting problem. °What are the causes? °This condition may be caused by heart conditions, such as: °· High blood pressure. °· Heart failure. °· Heart valve disease. °· Heart surgery. °Other causes include: °· Pneumonia. °· Obstructive sleep apnea. °· Lung cancer. °· Thyroid disease. °· Drinking too much alcohol. °Sometimes the cause is not known. °What increases the risk? °You are more likely to develop this condition if: °· You smoke. °· You are older. °· You have diabetes. °· You are overweight. °· You have a family history of this condition. °· You exercise often and hard. °What are the signs or symptoms? °Common symptoms of this condition include: °· A feeling like your heart is beating very fast. °· Chest pain. °· Feeling short of breath. °· Feeling light-headed or weak. °· Getting tired easily. °Follow these instructions at home: °Medicines °· Take over-the-counter and prescription medicines only as told by your doctor. °· If your doctor gives you a blood-thinning medicine, take it exactly as told. Taking too much of it can cause bleeding. Taking too little of it does not protect you against clots. Clots can cause a stroke. °Lifestyle ° °  ° °· Do not use any tobacco products. These include cigarettes, chewing tobacco, and e-cigarettes. If you need help quitting, ask your doctor. °· Do not drink alcohol. °· Do not drink beverages that have caffeine. These include coffee, soda, and tea. °· Follow diet instructions as told by your doctor. °· Exercise regularly as told by your doctor. °General  instructions °· If you have a condition that causes breathing to stop for a short period of time (apnea), treat it as told by your doctor. °· Keep a healthy weight. Do not use diet pills unless your doctor says they are safe for you. Diet pills may make heart problems worse. °· Keep all follow-up visits as told by your doctor. This is important. °Contact a doctor if: °· You notice a change in the speed, rhythm, or strength of your heartbeat. °· You are taking a blood-thinning medicine and you see more bruising. °· You get tired more easily when you move or exercise. °· You have a sudden change in weight. °Get help right away if: ° °· You have pain in your chest or your belly (abdomen). °· You have trouble breathing. °· You have blood in your vomit, poop, or pee (urine). °· You have any signs of a stroke. "BE FAST" is an easy way to remember the main warning signs: °? B - Balance. Signs are dizziness, sudden trouble walking, or loss of balance. °? E - Eyes. Signs are trouble seeing or a change in how you see. °? F - Face. Signs are sudden weakness or loss of feeling in the face, or the face or eyelid drooping on one side. °? A - Arms. Signs are weakness or loss of feeling in an arm. This happens suddenly and usually on one side of the body. °? S - Speech. Signs are sudden trouble speaking, slurred speech, or trouble understanding what people say. °? T - Time.   Time to call emergency services. Write down what time symptoms started. °· You have other signs of a stroke, such as: °? A sudden, very bad headache with no known cause. °? Feeling sick to your stomach (nausea). °? Throwing up (vomiting). °? Jerky movements you cannot control (seizure). °These symptoms may be an emergency. Do not wait to see if the symptoms will go away. Get medical help right away. Call your local emergency services (911 in the [place] Do not drive yourself to the hospital. °Summary °· Atrial fibrillation is a type of heartbeat that is irregular  or fast (rapid). °· You are at higher risk of this condition if you smoke, are older, have diabetes, or are overweight. °· Follow your doctor's instructions about medicines, diet, exercise, and follow-up visits. °· Get help right away if you think that you have signs of a stroke. °This information is not intended to replace advice given to you by your health care provider. Make sure you discuss any questions you have with your health care provider. °Document Released: 11/30/2007 Document Revised: 04/26/2017 Document Reviewed: 04/13/2017 °Elsevier Patient Education © 2020 [employer] ° °

## 2018-12-20 NOTE — Progress Notes (Signed)
Report called to Greg[facility]s at [place], EMS called regarding transport at this time.

## 2018-12-20 NOTE — Progress Notes (Signed)
Progress Note  Patient Name: Norma Hill Date of Encounter: 12/20/2018  Primary Cardiologist:  followed by [facility] cardiology  Subjective   No complaints this morning, feels well Husband at the bedside, reports that she is doing very well today, closer to her baseline " Best day she has had" He reports that she seems more alert, interactive Telemetry reviewed showing atrial fibrillation rate 80 bpm, up to 90 bpm with exertion  Inpatient Medications    Scheduled Meds: . apixaban  5 mg Oral BID  . aspirin EC  81 mg Oral Daily  . Chlorhexidine Gluconate Cloth  6 each Topical Daily  . diltiazem  180 mg Oral BID  . feeding supplement (ENSURE ENLIVE)  237 mL Oral BID BM  . guaiFENesin  600 mg Oral BID  . influenza vaccine adjuvanted  0.5 mL Intramuscular Tomorrow-1000  . metoprolol succinate  25 mg Oral Daily  . multivitamin with minerals  1 tablet Oral Daily  . polyethylene glycol  17 g Oral BID  . rosuvastatin  10 mg Oral Daily  . senna-docusate  2 tablet Oral BID  . sodium chloride flush  10 mL Intravenous Q12H  . tamsulosin  0.4 mg Oral Daily   Continuous Infusions: . sodium chloride Stopped (12/17/18 1401)   PRN Meds: sodium chloride, acetaminophen **OR** acetaminophen, diphenhydrAMINE **AND** acetaminophen, ipratropium-albuterol, magnesium hydroxide, ondansetron **OR** ondansetron (ZOFRAN) IV, traZODone   Vital Signs    Vitals:   12/20/18 0530 12/20/18 0532 12/20/18 0758 12/20/18 1308  BP: 121/67 122/62 116/65 117/70  Pulse: 76 81 76 73  Resp: 20 16 18    Temp: 97.6 F (36.4 C)  97.9 F (36.6 C)   TempSrc: Oral  Oral   SpO2: 97% 95% 96% 97%  Weight:      Height:        Intake/Output Summary (Last 24 hours) at 12/20/2018 1339 Last data filed at 12/20/2018 1337 Gross per 24 hour  Intake 480 ml  Output 850 ml  Net -370 ml   Last 3 Weights 12/19/2018 12/18/2018 12/17/2018  Weight (lbs) 133 lb 9.6 oz 131 lb 137 lb 11.2 oz  Weight (kg) 60.6 kg 59.421 kg  62.46 kg      Telemetry    Atrial fibrillation rate 80, 90 with ambulation- Personally Reviewed  ECG     - Personally Reviewed  Physical Exam   Constitutional:  oriented to person, place, and time. No distress.  HENT:  Head: Normocephalic and atraumatic.  Eyes:  no discharge. No scleral icterus.  Neck: Normal range of motion. Neck supple. No JVD present.  Cardiovascular: Irregularly irregular, normal heart sounds and intact distal pulses. Exam reveals no gallop and no friction rub. No edema No murmur heard. Pulmonary/Chest: Effort normal and breath sounds normal. No stridor. No respiratory distress.  no wheezes.  no rales.  no tenderness.  Abdominal: Soft.  no distension.  no tenderness.  Musculoskeletal: Normal range of motion.  no  tenderness or deformity.  Neurological:  normal muscle tone. Coordination normal. No atrophy Skin: Skin is warm and dry. No rash noted. not diaphoretic.  Psychiatric:  normal mood and affect. behavior is normal. Thought content normal.    Labs    High Sensitivity Troponin:   Recent Labs  Lab 12/04/18 1128 12/04/18 1458 12/11/18 2204 12/12/18 0438 12/12/18 0940  TROPONINIHS 16 16 10 9 10       Chemistry Recent Labs  Lab 12/14/18 0356 12/15/18 1006 12/20/18 0548  NA 141 141 141  K  3.3* 3.5 3.7  CL 107 105 105  CO2 24 25 29   GLUCOSE 91 104* 101*  BUN 10 16 24*  CREATININE 0.49 0.56 0.66  CALCIUM 8.7* 9.2 9.1  GFRNONAA >60 >60 >60  GFRAA >60 >60 >60  ANIONGAP 10 11 7      Hematology Recent Labs  Lab 12/14/18 0356 12/19/18 0519  WBC 5.6 5.9  RBC 4.28 4.52  HGB 11.9* 12.3  HCT 37.0 38.7  MCV 86.4 85.6  MCH 27.8 27.2  MCHC 32.2 31.8  RDW 15.3 15.0  PLT 207 220    BNPNo results for input(s): BNP, PROBNP in the last 168 hours.   DDimer No results for input(s): DDIMER in the last 168 hours.   Radiology    No results found.  Cardiac Studies  Echocardiogram December 04, 2018  1. Left ventricular ejection fraction,  by visual estimation, is 40 to 45%. The left ventricle has mildly decreased function. Left ventricular septal wall thickness was mildly increased. Mildly increased left ventricular posterior wall thickness. There  is mildly increased left ventricular hypertrophy.  2. Global right ventricle has normal systolic function.The right ventricular size is mildly enlarged. No increase in right ventricular wall thickness.  3. Left atrial size was mildly dilated.  4. Right atrial size was moderately dilated.  5. The mitral valve is grossly normal. Trace mitral valve regurgitation.  6. The tricuspid valve is grossly normal. Tricuspid valve regurgitation is mild.  7. The aortic valve is grossly normal Aortic valve regurgitation was not visualized by color flow Doppler.  8. The pulmonic valve was not well visualized. Pulmonic valve regurgitation is trivial by color flow Doppler.  9. Aortic dilatation noted. 10. There is mild dilatation of the aortic root measuring 3.5 cm mm. 11. Mildly elevated pulmonary artery systolic pressure. 12. The interatrial septum was not assessed.  Head MRI December 04, 2018 Multifocal intracranial arterial stenoses, presumably on the basis of atherosclerotic disease, most notably as follows. --Apparent moderate stenosis within the distal M1 right MCA and within an adjacent proximal inferior division M2 branch. --Apparent high-grade narrowing of the petrous left ICA at the  level of the skull base. However, this stenosis may be overestimated  due to artifact arising from the skull base. --Two focal stenosis within the cavernous left ICA, up to moderate  in severity. --Apparent at least moderate stenosis within the proximal left M2 superior division. --Moderate/severe stenosis within the P1 left PCA. Moderate focal stenosis within the P2 left PCA. There is also suggestion of  significant atherosclerotic irregularity within the distal P2 and  more distal left PCA.   Patient  Profile     73 y.o. 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   Assessment & Plan    A/P: Permanent atrial fibrillation s/p cryo-ablation in 2018 with recurrence of arrhythmia CHADS2VASc markedly elevated (HTN, age x 2, stroke x 2, female) --On numerous EKGs, poorly controlled rate dating back several years.  Suspect there has been some medication noncompliance at home -She arrived to the hospital not on any rate controlling medication as this was discontinued after recent stroke ----Medication change yesterday up to diltiazem  extended release 180 mg twice daily with metoprolol succinate 25 daily Rate well controlled, on aspirin for strokes and Eliquis 5 twice daily  History of CVA/dementia right MCA territory infarct  Also with underlying dementia MRI with Profound cerebral atherosclerotic plaque and regions of stenosis as detailed above -Agree with rehab -Weight loss, poor appetite over the past several months.  May need to meet with nutritionist.  Might need appetite stimulation medication such as Megace or other  Cardiomyopathy Prior echocardiogram February 2018 ejection fraction greater than 55% prior echocardiogram October 2019 [facility] (45 to 50%) Moderately dilated left atrium Ejection fraction now 40 to 45% Presumed to be tachycardia mediated -Aggressive rate control with medications as detailed above No plan for ischemic work-up  Syncope Long discussion with patient's husband,  several syncope episodes probably for at least 4 years or so, at least 4 episodes There was a Zio patch performed  November 2019 showing 3-second pause Etiology of recent syncope is unclear, seen by EP No evidence of orthostasis or hypotension this admission -Follow-up [facility] cardiology from rehab facility  Confusion/debility/dementia Follow-up with neurology  Long discussion concerning discharge instructions with husband, and hospitalist service  Total encounter time more than 35 minutes  Greater than 50% was spent in counseling and coordination of care with the patient    For questions or updates, please contact [place] Please consult [email or web address] for contact info under        Signed, Ida Rogue, MD  12/20/2018, 1:39 PM

## 2018-12-20 NOTE — Discharge Summary (Signed)
[name] NAME: Norma Hill    MR#:  [id number]  DATE OF BIRTH:  1945/04/24  DATE OF ADMISSION:  12/11/2018 ADMITTING PHYSICIAN: Christel Mormon, MD  DATE OF DISCHARGE: 12/20/2018  PRIMARY CARE PHYSICIAN: Guadalupe Maple, MD    ADMISSION DIAGNOSIS:  Syncope [R55] Atrial fibrillation with rapid ventricular response (HCC) [I48.91] Syncope, unspecified syncope type [R55] Community acquired pneumonia of right middle lobe of lung [J18.9]  DISCHARGE DIAGNOSIS:  Principal Problem:   Syncope and collapse Active Problems:   Atrial fibrillation with RVR (HCC)   CVA (cerebral vascular accident) ([place])   Pneumonia   Dementia ([place])   History of stroke   SECONDARY DIAGNOSIS:   Past Medical History:  Diagnosis Date  . A-fib ([place])   . Bowel trouble 2012   ocasionally  . Cancer ([place]) 10/2000   pt had 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 Pt then had a repeat excision on 11/16/2000  . Hypertension 2006  . Personal history of malignant neoplasm of breast 2002  . Solitary cyst of breast 2012  . Special screening for malignant neoplasms, colon     HOSPITAL COURSE:   1.  Permanent atrial fibrillation.  Case discussed with cardiologist.  Patient's heart rate better today with increased Cardizem 180 twice a day and low-dose metoprolol.  Patient on Eliquis for anticoagulation. 2.  Aspiration pneumonia treated with a full course of Unasyn 7 days.  Antibiotics discontinued 2 days ago. 3.  Syncope.  Recent echo and carotid Dopplers unremarkable.  Follow-up with Dr. Caryl Comes cardiology as outpatient 4.  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  Patient will go out to rehab today.  Patient on aspirin Eliquis and Crestor. 5.  Urinary retention.  Status post Foley catheter.  Failed voiding trial here in the hospital.  Will refer to urology as outpatient.  Patient on Flomax.  If catheter can  come out with voiding trial will need to be changed every 3 to 4 weeks. 6.  Hypertension on increased dose of Cardizem and low-dose metoprolol. 7.  Hyperlipidemia on Crestor 8.  Patient encouraged to eat.  DISCHARGE CONDITIONS:   Satisfactory  CONSULTS OBTAINED:  Treatment Team:  Minna Merritts, MD Hillary Bow, MD  DRUG ALLERGIES:  No Known Allergies  DISCHARGE MEDICATIONS:   Allergies as of 12/20/2018   No Known Allergies     Medication List    STOP taking these medications   lisinopril 5 MG tablet Commonly known as: ZESTRIL     TAKE these medications   acetaminophen 325 MG tablet Commonly known as: TYLENOL Take 2 tablets (650 mg total) by mouth every 6 (six) hours as needed for mild pain (or Fever >/= 101).   apixaban 5 MG Tabs tablet Commonly known as: ELIQUIS Take 1 tablet (5 mg total) by mouth 2 (two) times daily.   aspirin 81 MG EC tablet Take 1 tablet (81 mg total) by mouth daily.   diltiazem 180 MG 24 hr capsule Commonly known as: CARDIZEM CD Take 1 capsule (180 mg total) by mouth 2 (two) times daily.   diphenhydramine-acetaminophen 25-500 MG Tabs tablet Commonly known as: TYLENOL PM Take 1-2 tablets by mouth at bedtime as needed (sleep/pain).   feeding supplement (ENSURE ENLIVE) Liqd Take 237 mLs by mouth 2 (two) times daily between meals.   metoprolol succinate 25 MG 24 hr tablet Commonly known as: TOPROL-XL Take 1 tablet (25  mg total) by mouth daily.   multivitamin with minerals Tabs tablet Take 1 tablet by mouth daily.   polyethylene glycol 17 g packet Commonly known as: MIRALAX / GLYCOLAX Take 17 g by mouth 2 (two) times daily.   rosuvastatin 10 MG tablet Commonly known as: CRESTOR Take 1 tablet (10 mg total) by mouth daily.   senna-docusate 8.6-50 MG tablet Commonly known as: Senokot-S Take 2 tablets by mouth 2 (two) times daily.   tamsulosin 0.4 MG Caps capsule Commonly known as: FLOMAX Take 1 capsule (0.4 mg total) by  mouth daily.            Durable Medical Equipment  (From admission, onward)         Start     Ordered   12/16/18 1413  For home use only DME Walker rolling  Once    Question:  Patient needs a walker to treat with the following condition  Answer:  Stroke [facility])   12/16/18 1412           DISCHARGE INSTRUCTIONS:   Follow-up Dr. Ernst Spell 1 day Follow-up Dr. Caryl Comes cardiology 1 week Follow-up urology for voiding trial  If you experience worsening of your admission symptoms, develop shortness of breath, life threatening emergency, suicidal or homicidal thoughts you must seek medical attention immediately by calling 911 or calling your MD immediately  if symptoms less severe.  You Must read complete instructions/literature along with all the possible adverse reactions/side effects for all the Medicines you take and that have been prescribed to you. Take any new Medicines after you have completely understood and accept all the possible adverse reactions/side effects.   Please note  You were cared for by a hospitalist during your hospital stay. If you have any questions about your discharge medications or the care you received while you were in the hospital after you are discharged, you can call the unit and asked to speak with the hospitalist on call if the hospitalist that took care of you is not available. Once you are discharged, your primary care physician will handle any further medical issues. Please note that NO REFILLS for any discharge medications will be authorized once you are discharged, as it is imperative that you return to your primary care physician (or establish a relationship with a primary care physician if you do not have one) for your aftercare needs so that they can reassess your need for medications and monitor your lab values.    Today   CHIEF COMPLAINT:   Chief Complaint  Patient presents with  . Loss of Consciousness    HISTORY OF PRESENT ILLNESS:  Norma Hill  is a 73 y.o. female came in with loss of consciousness   VITAL SIGNS:  Blood pressure 116/65, pulse 76, temperature 97.9 F (36.6 C), temperature source Oral, resp. rate 18, height 5\' 5"  (1.651 m), weight 60.6 kg, SpO2 96 %.  I/O:    Intake/Output Summary (Last 24 hours) at 12/20/2018 1031 Last data filed at 12/20/2018 0300 Gross per 24 hour  Intake 720 ml  Output 500 ml  Net 220 ml    PHYSICAL EXAMINATION:  GENERAL:  73 y.o.-year-old patient lying in the bed with no acute distress.  EYES: Pupils equal, round, reactive to light and accommodation. No scleral icterus. Extraocular muscles intact.  HEENT: Head atraumatic, normocephalic. Oropharynx and nasopharynx clear.  NECK:  Supple, no jugular venous distention. No thyroid enlargement, no tenderness.  LUNGS: Normal breath sounds bilaterally, no wheezing, rales,rhonchi or  crepitation. No use of accessory muscles of respiration.  CARDIOVASCULAR: S1, S2 irregularly irregular. No murmurs, rubs, or gallops.  ABDOMEN: Soft, non-tender, non-distended. Bowel sounds present. No organomegaly or mass.  EXTREMITIES: No pedal edema, cyanosis, or clubbing.  NEUROLOGIC: Cranial nerves II through XII are intact. Muscle strength 5/5 in all extremities. Sensation intact. Gait not checked.  PSYCHIATRIC: The patient is alert and oriented x 3.  SKIN: No obvious rash, lesion, or ulcer.   DATA REVIEW:   CBC Recent Labs  Lab 12/19/18 0519  WBC 5.9  HGB 12.3  HCT 38.7  PLT 220    Chemistries  Recent Labs  Lab 12/20/18 0548  NA 141  K 3.7  CL 105  CO2 29  GLUCOSE 101*  BUN 24*  CREATININE 0.66  CALCIUM 9.1     Microbiology Results  Results for orders placed or performed during the hospital encounter of 12/11/18  SARS Coronavirus 2 [facility] order, Performed in [facility] hospital lab) Nasopharyngeal Nasopharyngeal Swab     Status: None   Collection Time: 12/11/18 10:04 PM   Specimen: Nasopharyngeal Swab  Result Value Ref  Range Status   SARS Coronavirus 2 NEGATIVE NEGATIVE Final    Comment: (NOTE) If result is NEGATIVE SARS-CoV-2 target nucleic acids are NOT DETECTED. The SARS-CoV-2 RNA is generally detectable in upper and lower  respiratory specimens during the acute phase of infection. The lowest  concentration of SARS-CoV-2 viral copies this assay can detect is 250  copies / mL. A negative result does not preclude SARS-CoV-2 infection  and should not be used as the sole basis for treatment or other  patient management decisions.  A negative result may occur with  improper specimen collection / handling, submission of specimen other  than nasopharyngeal swab, presence of viral mutation(s) within the  areas targeted by this assay, and inadequate number of viral copies  (<250 copies / mL). A negative result must be combined with clinical  observations, patient history, and epidemiological information. If result is POSITIVE SARS-CoV-2 target nucleic acids are DETECTED. The SARS-CoV-2 RNA is generally detectable in upper and lower  respiratory specimens dur ing the acute phase of infection.  Positive  results are indicative of active infection with SARS-CoV-2.  Clinical  correlation with patient history and other diagnostic information is  necessary to determine patient infection status.  Positive results do  not rule out bacterial infection or co-infection with other viruses. If result is PRESUMPTIVE POSTIVE SARS-CoV-2 nucleic acids MAY BE PRESENT.   A presumptive positive result was obtained on the submitted specimen  and confirmed on repeat testing.  While 2019 novel coronavirus  (SARS-CoV-2) nucleic acids may be present in the submitted sample  additional confirmatory testing may be necessary for epidemiological  and / or clinical management purposes  to differentiate between  SARS-CoV-2 and other Sarbecovirus currently known to infect humans.  If clinically indicated additional testing with an  alternate test  methodology [phone]) is advised. The SARS-CoV-2 RNA is generally  detectable in upper and lower respiratory sp ecimens during the acute  phase of infection. The expected result is Negative. Fact Sheet for Patients:  StrictlyIdeas.no Fact Sheet for Healthcare Providers: BankingDealers.co.za This test is not yet approved or cleared by the [place] FDA and has been authorized for detection and/or diagnosis of SARS-CoV-2 by FDA under an Emergency Use Authorization (EUA).  This EUA will remain in effect (meaning this test can be used) for the duration of the COVID-19 declaration under Section 564(b)(1)  of the Act, 21 U.S.C. section 360bbb-3(b)(1), unless the authorization is terminated or revoked sooner. Performed at [facility], [address] [place], [place] [address]   Blood culture (routine x 2)     Status: None   Collection Time: 12/11/18 10:04 PM   Specimen: BLOOD  Result Value Ref Range Status   Specimen Description BLOOD RIGHT ANTECUBITAL  Final   Special Requests   Final    BOTTLES DRAWN AEROBIC AND ANAEROBIC Blood Culture adequate volume   Culture   Final    NO GROWTH 5 DAYS Performed at [facility], [address], [place], [place] [address]    Report Status 12/16/2018 FINAL  Final  Blood culture (routine x 2)     Status: None   Collection Time: 12/11/18 10:04 PM   Specimen: BLOOD  Result Value Ref Range Status   Specimen Description BLOOD BLOOD RIGHT FOREARM  Final   Special Requests   Final    BOTTLES DRAWN AEROBIC AND ANAEROBIC Blood Culture adequate volume   Culture   Final    NO GROWTH 5 DAYS Performed at [facility], [address], [place], [place] [address]    Report Status 12/16/2018 FINAL  Final     Management plans discussed with the patient, family and they are in agreement.  CODE STATUS:     Code Status Orders  (From admission, onward)          Start     Ordered   12/11/18 2337  Full code  Continuous     12/11/18 2336        Code Status History    Date Active Date Inactive Code Status Order ID Comments User Context   12/04/2018 [id number] 12/09/2018 2154 Full Code [id number]  Bettey Costa, MD Inpatient   09/30/2018 0017 10/01/2018 1932 Full Code [id number]  Harrie Foreman, MD Inpatient   05/12/2017 2014 05/16/2017 1749 Full Code [id number]  Dustin Flock, MD Inpatient   02/10/2016 0052 02/11/2016 1603 Full Code [id number]  Hugelmeyer, Ubaldo Glassing, DO Inpatient   Advance Care Planning Activity      TOTAL TIME TAKING CARE OF THIS PATIENT: 35 minutes.    Loletha Grayer M.D on 12/20/2018 at 10:31 AM  Between 7am to 6pm - Pager - [phone]  After 6pm go to [email or web address] - password [email or web address] [facility]  [phone]  CC: Primary care physician; Guadalupe Maple, MD

## 2018-12-20 NOTE — Plan of Care (Signed)
  Problem: Clinical Measurements: Goal: Respiratory complications will improve Outcome: Progressing Goal: Cardiovascular complication will be avoided Outcome: Progressing   Problem: Coping: Goal: Level of anxiety will decrease Outcome: Progressing   Problem: Safety: Goal: Ability to remain free from injury will improve Outcome: Progressing   Problem: Skin Integrity: Goal: Risk for impaired skin integrity will decrease Outcome: Progressing   Problem: Respiratory: Goal: Ability to maintain adequate ventilation will improve Outcome: Progressing Goal: Ability to maintain a clear airway will improve Outcome: Progressing

## 2018-12-24 ENCOUNTER — Inpatient Hospital Stay
Admission: EM | Admit: 2018-12-24 | Discharge: 2018-12-27 | DRG: 065 | Disposition: A | Discharge status: Skilled Nursing Facility | Payer: Medicare Other | Attending: Internal Medicine | Admitting: Internal Medicine

## 2018-12-24 ENCOUNTER — Ambulatory Visit: Payer: Medicare Other | Admitting: Family Medicine

## 2018-12-24 ENCOUNTER — Emergency Department: Payer: Medicare Other

## 2018-12-24 ENCOUNTER — Other Ambulatory Visit: Payer: Self-pay

## 2018-12-24 ENCOUNTER — Encounter: Payer: Self-pay | Admitting: Emergency Medicine

## 2018-12-24 DIAGNOSIS — N390 Urinary tract infection, site not specified: Secondary | ICD-10-CM | POA: Diagnosis present

## 2018-12-24 DIAGNOSIS — I63511 Cerebral infarction due to unspecified occlusion or stenosis of right middle cerebral artery: Principal | ICD-10-CM | POA: Diagnosis present

## 2018-12-24 DIAGNOSIS — L8990 Pressure ulcer of unspecified site, unspecified stage: Secondary | ICD-10-CM | POA: Insufficient documentation

## 2018-12-24 DIAGNOSIS — I10 Essential (primary) hypertension: Secondary | ICD-10-CM | POA: Diagnosis present

## 2018-12-24 DIAGNOSIS — E785 Hyperlipidemia, unspecified: Secondary | ICD-10-CM | POA: Diagnosis present

## 2018-12-24 DIAGNOSIS — Z9049 Acquired absence of other specified parts of digestive tract: Secondary | ICD-10-CM

## 2018-12-24 DIAGNOSIS — F0390 Unspecified dementia without behavioral disturbance: Secondary | ICD-10-CM | POA: Diagnosis present

## 2018-12-24 DIAGNOSIS — Z79899 Other long term (current) drug therapy: Secondary | ICD-10-CM

## 2018-12-24 DIAGNOSIS — G9340 Encephalopathy, unspecified: Secondary | ICD-10-CM | POA: Diagnosis present

## 2018-12-24 DIAGNOSIS — Z803 Family history of malignant neoplasm of breast: Secondary | ICD-10-CM

## 2018-12-24 DIAGNOSIS — R531 Weakness: Secondary | ICD-10-CM | POA: Diagnosis not present

## 2018-12-24 DIAGNOSIS — Z7982 Long term (current) use of aspirin: Secondary | ICD-10-CM

## 2018-12-24 DIAGNOSIS — Z66 Do not resuscitate: Secondary | ICD-10-CM | POA: Diagnosis present

## 2018-12-24 DIAGNOSIS — E860 Dehydration: Secondary | ICD-10-CM | POA: Diagnosis present

## 2018-12-24 DIAGNOSIS — Z7901 Long term (current) use of anticoagulants: Secondary | ICD-10-CM

## 2018-12-24 DIAGNOSIS — Z20828 Contact with and (suspected) exposure to other viral communicable diseases: Secondary | ICD-10-CM | POA: Diagnosis present

## 2018-12-24 DIAGNOSIS — I4891 Unspecified atrial fibrillation: Secondary | ICD-10-CM | POA: Diagnosis present

## 2018-12-24 DIAGNOSIS — I69354 Hemiplegia and hemiparesis following cerebral infarction affecting left non-dominant side: Secondary | ICD-10-CM

## 2018-12-24 DIAGNOSIS — Z853 Personal history of malignant neoplasm of breast: Secondary | ICD-10-CM

## 2018-12-24 DIAGNOSIS — R630 Anorexia: Secondary | ICD-10-CM | POA: Diagnosis present

## 2018-12-24 LAB — CBC WITH DIFFERENTIAL/PLATELET
Abs Immature Granulocytes: 0.02 10*3/uL (ref 0.00–0.07)
Basophils Absolute: 0 10*3/uL (ref 0.0–0.1)
Basophils Relative: 1 %
Eosinophils Absolute: 0 10*3/uL (ref 0.0–0.5)
Eosinophils Relative: 1 %
HCT: 39.5 % (ref 36.0–46.0)
Hemoglobin: 12.7 g/dL (ref 12.0–15.0)
Immature Granulocytes: 0 %
Lymphocytes Relative: 24 %
Lymphs Abs: 1.6 10*3/uL (ref 0.7–4.0)
MCH: 28.2 pg (ref 26.0–34.0)
MCHC: 32.2 g/dL (ref 30.0–36.0)
MCV: 87.8 fL (ref 80.0–100.0)
Monocytes Absolute: 0.5 10*3/uL (ref 0.1–1.0)
Monocytes Relative: 8 %
Neutro Abs: 4.5 10*3/uL (ref 1.7–7.7)
Neutrophils Relative %: 66 %
Platelets: 201 10*3/uL (ref 150–400)
RBC: 4.5 MIL/uL (ref 3.87–5.11)
RDW: 15.1 % (ref 11.5–15.5)
WBC: 6.7 10*3/uL (ref 4.0–10.5)
nRBC: 0 % (ref 0.0–0.2)

## 2018-12-24 LAB — COMPREHENSIVE METABOLIC PANEL
ALT: 56 U/L — ABNORMAL HIGH (ref 0–44)
AST: 54 U/L — ABNORMAL HIGH (ref 15–41)
Albumin: 3.8 g/dL (ref 3.5–5.0)
Alkaline Phosphatase: 86 U/L (ref 38–126)
Anion gap: 9 (ref 5–15)
BUN: 21 mg/dL (ref 8–23)
CO2: 29 mmol/L (ref 22–32)
Calcium: 9.7 mg/dL (ref 8.9–10.3)
Chloride: 104 mmol/L (ref 98–111)
Creatinine, Ser: 0.78 mg/dL (ref 0.44–1.00)
GFR calc Af Amer: >60 mL/min (ref >60)
GFR calc non Af Amer: >60 mL/min (ref >60)
Glucose, Bld: 110 mg/dL — ABNORMAL HIGH (ref 70–99)
Potassium: 3.5 mmol/L (ref 3.5–5.1)
Sodium: 142 mmol/L (ref 135–145)
Total Bilirubin: 1 mg/dL (ref 0.3–1.2)
Total Protein: 6.3 g/dL — ABNORMAL LOW (ref 6.5–8.1)

## 2018-12-24 LAB — URINALYSIS, COMPLETE (UACMP) WITH MICROSCOPIC
Bilirubin Urine: NEGATIVE
Glucose, UA: NEGATIVE mg/dL
Ketones, ur: 20 mg/dL — AB
Nitrite: NEGATIVE
Protein, ur: 100 mg/dL — AB
RBC / HPF: >50 RBC/hpf — ABNORMAL HIGH (ref 0–5)
Specific Gravity, Urine: 1.025 (ref 1.005–1.030)
WBC, UA: >50 WBC/hpf — ABNORMAL HIGH (ref 0–5)
pH: 5 (ref 5.0–8.0)

## 2018-12-24 LAB — TROPONIN I (HIGH SENSITIVITY): Troponin I (High Sensitivity): 13 ng/L (ref <18)

## 2018-12-24 MED ORDER — METOPROLOL SUCCINATE ER 25 MG PO TB24
25.0000 mg | ORAL_TABLET | Freq: Every day | ORAL | Status: DC
  Administered 2018-12-24 – 2018-12-27 (×4): 25 mg via ORAL
  Filled 2018-12-24 (×4): qty 1

## 2018-12-24 MED ORDER — FLUCONAZOLE 50 MG PO TABS
150.0000 mg | ORAL_TABLET | Freq: Once | ORAL | Status: AC
  Administered 2018-12-24: 150 mg via ORAL
  Filled 2018-12-24: qty 1

## 2018-12-24 MED ORDER — SODIUM CHLORIDE 0.9 % IV SOLN
1.0000 g | INTRAVENOUS | Status: DC
  Administered 2018-12-25: 1 g via INTRAVENOUS
  Filled 2018-12-24: qty 1
  Filled 2018-12-24: qty 10

## 2018-12-24 MED ORDER — SODIUM CHLORIDE 0.9 % IV SOLN
INTRAVENOUS | Status: DC
  Administered 2018-12-24 – 2018-12-26 (×4): via INTRAVENOUS

## 2018-12-24 MED ORDER — POLYETHYLENE GLYCOL 3350 17 G PO PACK
17.0000 g | PACK | Freq: Two times a day (BID) | ORAL | Status: DC
  Administered 2018-12-24 – 2018-12-27 (×5): 17 g via ORAL
  Filled 2018-12-24 (×5): qty 1

## 2018-12-24 MED ORDER — ONDANSETRON HCL 4 MG/2ML IJ SOLN: 4.0000 mg | Freq: Four times a day (QID) | INTRAMUSCULAR | Status: DC | PRN

## 2018-12-24 MED ORDER — ROSUVASTATIN CALCIUM 10 MG PO TABS
10.0000 mg | ORAL_TABLET | Freq: Every day | ORAL | Status: DC
  Administered 2018-12-25 – 2018-12-27 (×3): 10 mg via ORAL
  Filled 2018-12-24 (×5): qty 1

## 2018-12-24 MED ORDER — DIPHENHYDRAMINE-APAP (SLEEP) 25-500 MG PO TABS: 2.0000 | ORAL_TABLET | Freq: Every evening | ORAL | Status: DC | PRN

## 2018-12-24 MED ORDER — MAGNESIUM HYDROXIDE 400 MG/5ML PO SUSP
30.0000 mL | Freq: Every day | ORAL | Status: DC | PRN
  Filled 2018-12-24: qty 30

## 2018-12-24 MED ORDER — SODIUM CHLORIDE 0.9 % IV SOLN: 1.0000 g | INTRAVENOUS | Status: DC

## 2018-12-24 MED ORDER — DIPHENHYDRAMINE HCL 25 MG PO CAPS: 50.0000 mg | ORAL_CAPSULE | Freq: Every evening | ORAL | Status: DC | PRN

## 2018-12-24 MED ORDER — ACETAMINOPHEN 325 MG PO TABS: 650.0000 mg | ORAL_TABLET | Freq: Four times a day (QID) | ORAL | Status: DC | PRN

## 2018-12-24 MED ORDER — APIXABAN 5 MG PO TABS
5.0000 mg | ORAL_TABLET | Freq: Two times a day (BID) | ORAL | Status: DC
  Administered 2018-12-24 – 2018-12-27 (×6): 5 mg via ORAL
  Filled 2018-12-24 (×6): qty 1

## 2018-12-24 MED ORDER — POTASSIUM CHLORIDE 20 MEQ PO PACK
40.0000 meq | PACK | Freq: Once | ORAL | Status: AC
  Administered 2018-12-24: 40 meq via ORAL
  Filled 2018-12-24: qty 2

## 2018-12-24 MED ORDER — ASPIRIN EC 81 MG PO TBEC
81.0000 mg | DELAYED_RELEASE_TABLET | Freq: Every day | ORAL | Status: DC
  Administered 2018-12-24 – 2018-12-27 (×4): 81 mg via ORAL
  Filled 2018-12-24 (×7): qty 1

## 2018-12-24 MED ORDER — ADULT MULTIVITAMIN W/MINERALS CH
1.0000 | ORAL_TABLET | Freq: Every day | ORAL | Status: DC
  Administered 2018-12-25 – 2018-12-27 (×3): 1 via ORAL
  Filled 2018-12-24 (×3): qty 1

## 2018-12-24 MED ORDER — ACETAMINOPHEN 650 MG RE SUPP: 650.0000 mg | Freq: Four times a day (QID) | RECTAL | Status: DC | PRN

## 2018-12-24 MED ORDER — ONDANSETRON HCL 4 MG PO TABS: 4.0000 mg | ORAL_TABLET | Freq: Four times a day (QID) | ORAL | Status: DC | PRN

## 2018-12-24 MED ORDER — ENSURE ENLIVE PO LIQD
237.0000 mL | Freq: Two times a day (BID) | ORAL | Status: DC
  Administered 2018-12-25 – 2018-12-27 (×4): 237 mL via ORAL

## 2018-12-24 MED ORDER — TRAZODONE HCL 50 MG PO TABS: 25.0000 mg | ORAL_TABLET | Freq: Every evening | ORAL | Status: DC | PRN

## 2018-12-24 MED ORDER — DILTIAZEM HCL ER COATED BEADS 180 MG PO CP24
180.0000 mg | ORAL_CAPSULE | Freq: Two times a day (BID) | ORAL | Status: DC
  Administered 2018-12-25 – 2018-12-27 (×6): 180 mg via ORAL
  Filled 2018-12-24 (×7): qty 1

## 2018-12-24 MED ORDER — SENNA 8.6 MG PO TABS
2.0000 | ORAL_TABLET | Freq: Two times a day (BID) | ORAL | Status: DC
  Administered 2018-12-24 – 2018-12-27 (×6): 17.2 mg via ORAL
  Filled 2018-12-24 (×6): qty 2

## 2018-12-24 MED ORDER — SODIUM CHLORIDE 0.9 % IV SOLN
1.0000 g | Freq: Once | INTRAVENOUS | Status: AC
  Administered 2018-12-24: 1 g via INTRAVENOUS
  Filled 2018-12-24: qty 10

## 2018-12-24 MED ORDER — ACETAMINOPHEN 500 MG PO TABS: 1000.0000 mg | ORAL_TABLET | Freq: Every evening | ORAL | Status: DC | PRN

## 2018-12-24 MED ORDER — TAMSULOSIN HCL 0.4 MG PO CAPS
0.4000 mg | ORAL_CAPSULE | Freq: Every day | ORAL | Status: DC
  Administered 2018-12-24 – 2018-12-27 (×4): 0.4 mg via ORAL
  Filled 2018-12-24 (×4): qty 1

## 2018-12-24 NOTE — ED Notes (Signed)
ED Provider at bedside. 

## 2018-12-24 NOTE — ED Notes (Signed)
Patient transported to MRI 

## 2018-12-24 NOTE — ED Provider Notes (Signed)
[facility] Emergency Department Provider Note       Time seen: ----------------------------------------- 12:23 PM on 12/24/2018 -----------------------------------------   I have reviewed the triage vital signs and the nursing notes.  HISTORY   Chief Complaint Weakness   HPI Norma Hill is a 73 y.o. female with a history of atrial fibrillation, cancer, hypertension who presents to the ED for increasing weakness since Saturday.  According to EMS she is ambulatory at baseline but is not been walking for the past several days.  She is confused at baseline.  Recent CVA involving the left side with left-sided weakness.  Past Medical History:  Diagnosis Date  . A-fib ([place])   . Bowel trouble 2012   ocasionally  . Cancer ([place]) 10/2000   pt had 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 Pt then had a repeat excision on 11/16/2000  . Hypertension 2006  . Personal history of malignant neoplasm of breast 2002  . Solitary cyst of breast 2012  . Special screening for malignant neoplasms, colon     Patient Active Problem List   Diagnosis Date Noted  . Dementia ([place]) 12/19/2018  . History of stroke 12/19/2018  . Syncope and collapse 12/19/2018  . Pneumonia 12/11/2018  . CVA (cerebral vascular accident) ([place]) 12/04/2018  . Atrial fibrillation with RVR ([place]) 09/29/2018  . Pleural effusion 05/13/2017  . SOB (shortness of breath) 05/12/2017  . New onset atrial fibrillation ([place]) 02/09/2016  . Personal history of malignant neoplasm of breast   . Cancer ([place]) 10/04/2000    Past Surgical History:  Procedure Laterality Date  . BREAST BIOPSY Right 2002  . BREAST SURGERY Right 2002   wide excision and sn x4 done 10/29/00 with repeat excision done on 11/16/2000 with post operative radiation treatment and 5 years of tamoxifen with completion in 2007  . CHOLECYSTECTOMY  2002  . COLONOSCOPY  2008  . DILATION AND  CURETTAGE OF UTERUS  1990,s  . ELECTROPHYSIOLOGIC STUDY N/A 03/16/2016   Procedure: CARDIOVERSION;  Surgeon: Isaias Cowman, MD;  Location: [facility] ORS;  Service: Cardiovascular;  Laterality: N/A;    Allergies Patient has no known allergies.  Social History Social History   Tobacco Use  . Smoking status: Never Smoker  . Smokeless tobacco: Never Used  Substance Use Topics  . Alcohol use: No  . Drug use: No   Review of Systems Constitutional: Negative for fever. Cardiovascular: Negative for chest pain. Respiratory: Negative for shortness of breath. Gastrointestinal: Negative for abdominal pain, vomiting and diarrhea. Musculoskeletal: Negative for back pain. Skin: Negative for rash. Neurological: Positive for weakness  All systems negative/normal/unremarkable except as stated in the HPI  ____________________________________________   PHYSICAL EXAM:  VITAL SIGNS: ED Triage Vitals [12/24/18 1216]  Enc Vitals Group     BP 139/72     Pulse Rate 96     Resp 16     Temp 98.4 F (36.9 C)     Temp Source Oral     SpO2 97 %     Weight 150 lb (68 kg)     Height 5\' 3"  (1.6 m)     Head Circumference      Peak Flow      Pain Score 0     Pain Loc      Pain Edu?      Excl. in [place]?    Constitutional:  Well appearing and in no distress. Eyes: Conjunctivae are normal. Normal extraocular movements. Cardiovascular: Normal  rate, regular rhythm. No murmurs, rubs, or gallops. Respiratory: Normal respiratory effort without tachypnea nor retractions. Breath sounds are clear and equal bilaterally. No wheezes/rales/rhonchi. Gastrointestinal: Soft and nontender. Normal bowel sounds Musculoskeletal: Nontender with normal range of motion in extremities. No lower extremity tenderness nor edema. Neurologic:  Normal speech and language.  Left-sided weakness is noted Skin:  Skin is warm, dry and intact. No rash noted. Psychiatric: Mood and affect are normal. Speech and behavior are normal.   ____________________________________________  EKG: Interpreted by me.  Atrial fibrillation with a rate of 84 bpm, baseline artifact, leftward axis, normal QT  ____________________________________________  ED COURSE:  As part of my medical decision making, I reviewed the following data within the [place] History obtained from family if available, nursing notes, old chart and ekg, as well as notes from prior ED visits. Patient presented for weakness, we will assess with labs and imaging as indicated at this time.   Procedures  [name] was evaluated in Emergency Department on 12/24/2018 for the symptoms described in the history of present illness. She was evaluated in the context of the global COVID-19 pandemic, which necessitated consideration that the patient might be at risk for infection with the SARS-CoV-2 virus that causes COVID-19. Institutional protocols and algorithms that pertain to the evaluation of patients at risk for COVID-19 are in a state of rapid change based on information released by regulatory bodies including the CDC and federal and state organizations. These policies and algorithms were followed during the patient's care in the ED.  ____________________________________________   LABS (pertinent positives/negatives)  Labs Reviewed  COMPREHENSIVE METABOLIC PANEL - Abnormal; Notable for the following components:      Result Value   Glucose, Bld 110 (*)    Total Protein 6.3 (*)    AST 54 (*)    ALT 56 (*)    All other components within normal limits  CBC WITH DIFFERENTIAL/PLATELET  URINALYSIS, COMPLETE (UACMP) WITH MICROSCOPIC  CBG MONITORING, ED  TROPONIN I (HIGH SENSITIVITY)    RADIOLOGY Images were viewed by me  CT head, chest x-ray IMPRESSION:  1. No change in RIGHT MCA territory infarction demonstrated on  recent CT.  2. No acute intracranial findings.  3. Atrophy and white matter microvascular disease.   IMPRESSION:  No  active disease.  ____________________________________________   DIFFERENTIAL DIAGNOSIS   Dementia, CVA, dehydration, electrolyte abnormality, occult infection  FINAL ASSESSMENT AND PLAN  Weakness, recent CVA   Plan: The patient had presented for progressive weakness now with an inability to walk. Patient's labs do not reveal any acute process. Patient's imaging does not reveal any interval change in her right MCA territory infarct.  Husband states she is weaker now and also having more trouble swallowing.  She has not really eaten much.  He feels like she needs a higher level of care.  Have consulted social work and speech pathology for swallowing evaluation.   Laurence Aly, MD    Note: This note was generated in part or whole with voice recognition software. Voice recognition is usually quite accurate but there are transcription errors that can and very often do occur. I apologize for any typographical errors that were not detected and corrected.     Earleen Newport, MD 12/24/18 [phone]

## 2018-12-24 NOTE — ED Notes (Signed)
Pt waiting on admission.  Pt alert.  Report off to jennifer I rn.

## 2018-12-24 NOTE — ED Notes (Signed)
Pt in mri,  Resumed care from [employer].  Family in room

## 2018-12-24 NOTE — H&P (Signed)
[facility] [place] NAME: Norma Hill    MR#:  [id number]  DATE OF BIRTH:  [birth year]  DATE OF ADMISSION:  12/24/2018  PRIMARY CARE PHYSICIAN: Guadalupe Maple, MD   REQUESTING/REFERRING PHYSICIAN: Derrell Lolling, MD  CHIEF COMPLAINT:   Chief Complaint  Patient presents with   Weakness    HISTORY OF PRESENT ILLNESS:  Norma Hill  is a 73 y.o. Caucasian female with a known history of multiple medical problems that will be mentioned below including atrial fibrillation and right breast cancer, presented to emergency room with an onset of generalized weakness since Saturday.  She has not been ambulating over the last several days and she is ambulatory at baseline.  She had a recent CVA with left-sided hemiparesis which has improved.  She is confused at baseline.  No reported fever or chills.  She denies any nausea or vomiting or abdominal pain.  She admitted to urinary frequency and urgency without dysuria or hematuria or flank pain.  No headache or dizziness or blurred vision.  No dysphagia or dysarthria.  Upon presentation to the emergency room, vital signs were within normal.  Labs revealed borderline potassium of 3.5.  AST and ALT are slightly elevated and troponin I was 13.  UA was strongly positive for UTI and showed budding yeast and hyaline casts and urine blood culture was sent.  Noncontrasted head CT scan revealed no change in the right MCA territory infarction demonstrated on recent CT and no acute intracranial normalities.  It showed atrophy and white matter microvascular disease.  Atrial fibrillation with controlled response of 84.  Contrast revealed subacute infarction as previously seen in the right parietal cortical and subcortical brain however with interval extension of the infarction inferior to the previously seen stroke in the more inferior right parietal lobe, deep insula and temporoparietal junction region.  There was no  evidence for hemorrhage significant mass affect or shift.  It is difficult to date the age of the extension precisely.  It showed chronic small vessel ischemic changes throughout the brain.  EKG showed atrial fibrillation with controlled ventricular sponsor of 84.  The patient was given a gram of IV Rocephin and her 50 mg p.o. Diflucan.  She will be admitted to an observation medical monitor bed for further evaluation and management. PAST MEDICAL HISTORY:   Past Medical History:  Diagnosis Date   A-fib [facility])    Bowel trouble 2012   ocasionally   Cancer [facility]) 10/2000   pt had 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 Pt then had a repeat excision on 11/16/2000   Hypertension 2006   Personal history of malignant neoplasm of breast 2002   Solitary cyst of breast 2012   Special screening for malignant neoplasms, colon     PAST SURGICAL HISTORY:   Past Surgical History:  Procedure Laterality Date   BREAST BIOPSY Right 2002   BREAST SURGERY Right 2002   wide excision and sn x4 done 10/29/00 with repeat excision done on 11/16/2000 with post operative radiation treatment and 5 years of tamoxifen with completion in 2007   CHOLECYSTECTOMY  2002   COLONOSCOPY  2008   DILATION AND CURETTAGE OF UTERUS  1990,s   ELECTROPHYSIOLOGIC STUDY N/A 03/16/2016   Procedure: CARDIOVERSION;  Surgeon: Isaias Cowman, MD;  Location: [facility] ORS;  Service: Cardiovascular;  Laterality: N/A;    SOCIAL HISTORY:   Social History   Tobacco Use  Smoking status: Never Smoker   Smokeless tobacco: Never Used  Substance Use Topics   Alcohol use: No    FAMILY HISTORY:   Family History  Problem Relation Age of Onset   Cancer Other        breast, relationship not listed    DRUG ALLERGIES:  No Known Allergies  REVIEW OF SYSTEMS:   ROS As per history of present illness. All pertinent systems were reviewed above. Constitutional,  HEENT,  cardiovascular, respiratory, GI, GU, musculoskeletal, neuro, psychiatric, endocrine,  integumentary and hematologic systems were reviewed and are otherwise  negative/unremarkable except for positive findings mentioned above in the HPI.   MEDICATIONS AT HOME:   Prior to Admission medications   Medication Sig Start Date End Date Taking? Authorizing Provider  apixaban (ELIQUIS) 5 MG TABS tablet Take 1 tablet (5 mg total) by mouth 2 (two) times daily. 10/01/18  Yes Gladstone Lighter, MD  aspirin EC 81 MG EC tablet Take 1 tablet (81 mg total) by mouth daily. 12/10/18  Yes Dustin Flock, MD  diltiazem (CARDIZEM CD) 180 MG 24 hr capsule Take 1 capsule (180 mg total) by mouth 2 (two) times daily. 12/20/18  Yes Wieting, Richard, MD  diphenhydramine-acetaminophen (TYLENOL PM) 25-500 MG TABS tablet Take 2 tablets by mouth at bedtime as needed (pain/sleep).   Yes [provider]  feeding supplement, ENSURE ENLIVE, (ENSURE ENLIVE) LIQD Take 237 mLs by mouth 2 (two) times daily between meals. 12/20/18  Yes Wieting, Richard, MD  metoprolol succinate (TOPROL-XL) 25 MG 24 hr tablet Take 1 tablet (25 mg total) by mouth daily. 12/20/18  Yes Wieting, Richard, MD  Multiple Vitamin (MULTIVITAMIN WITH MINERALS) TABS tablet Take 1 tablet by mouth daily. 12/20/18  Yes Wieting, Richard, MD  polyethylene glycol (MIRALAX / GLYCOLAX) 17 g packet Take 17 g by mouth 2 (two) times daily. 12/20/18  Yes Wieting, Richard, MD  rosuvastatin (CRESTOR) 10 MG tablet Take 1 tablet (10 mg total) by mouth daily. 12/09/18  Yes Dustin Flock, MD  senna (SENOKOT) 8.6 MG TABS tablet Take 2 tablets by mouth 2 (two) times daily.   Yes [provider]  tamsulosin (FLOMAX) 0.4 MG CAPS capsule Take 1 capsule (0.4 mg total) by mouth daily. 12/20/18  Yes Loletha Grayer, MD  acetaminophen (TYLENOL) 325 MG tablet Take 2 tablets (650 mg total) by mouth every 6 (six) hours as needed for mild pain (or Fever >/= 101). Patient not  taking: Reported on 12/24/2018 12/20/18   Loletha Grayer, MD  senna-docusate (SENOKOT-S) 8.6-50 MG tablet Take 2 tablets by mouth 2 (two) times daily. Patient not taking: Reported on 12/24/2018 12/20/18   Loletha Grayer, MD      VITAL SIGNS:  Blood pressure 127/62, pulse (!) 122, temperature 98.4 F (36.9 C), temperature source Oral, resp. rate 18, height 5\' 3"  (1.6 m), weight 68 kg, SpO2 96 %.  PHYSICAL EXAMINATION:  Physical Exam  GENERAL:  73 y.o.-year-old Caucasian female patient lying in the bed with no acute distress.  EYES: Pupils equal, round, reactive to light and accommodation. No scleral icterus. Extraocular muscles intact.  HEENT: Head atraumatic, normocephalic. Oropharynx and nasopharynx clear.  NECK:  Supple, no jugular venous distention. No thyroid enlargement, no tenderness.  LUNGS: Normal breath sounds bilaterally, no wheezing, rales,rhonchi or crepitation. No use of accessory muscles of respiration.  CARDIOVASCULAR: Regular rate and rhythm, S1, S2 normal. No murmurs, rubs, or gallops.  ABDOMEN: Soft, nondistended, nontender. Bowel sounds present. No organomegaly or mass.  EXTREMITIES: No pedal edema,  cyanosis, or clubbing.  NEUROLOGIC: Cranial nerves II 12 grossly intact except for mild left facial droop.  She had residual mild left-sided hemiparesis with motor strength of 3-4/5 in left upper and lower extremities compared to 5/5 in the right upper and lower extremities. PSYCHIATRIC: The patient is mildly confused.  She had good eye contact. SKIN: No obvious rash, lesion, or ulcer.   LABORATORY PANEL:   CBC Recent Labs  Lab 12/24/18 1227  WBC 6.7  HGB 12.7  HCT 39.5  PLT 201   ------------------------------------------------------------------------------------------------------------------  Chemistries  Recent Labs  Lab 12/24/18 1227  NA 142  K 3.5  CL 104  CO2 29  GLUCOSE 110*  BUN 21  CREATININE 0.78  CALCIUM 9.7  AST 54*  ALT 56*  ALKPHOS  86  BILITOT 1.0   ------------------------------------------------------------------------------------------------------------------  Cardiac Enzymes No results for input(s): TROPONINI in the last 168 hours. ------------------------------------------------------------------------------------------------------------------  RADIOLOGY:  Dg Chest 1 View  Result Date: 12/24/2018 CLINICAL DATA:  Weakness. EXAM: CHEST  1 VIEW COMPARISON:  December 14, 2018. FINDINGS: Stable cardiomegaly. No pneumothorax or pleural effusion is noted. Both lungs are clear. The visualized skeletal structures are unremarkable. IMPRESSION: No active disease. Electronically Signed   By: Marijo Conception M.D.   On: 12/24/2018 13:07   Ct Head Wo Contrast  Result Date: 12/24/2018 CLINICAL DATA:  Generalized weakness and confusion.Altered level of consciousness (LOC), unexplained EXAM: CT HEAD WITHOUT CONTRAST TECHNIQUE: Contiguous axial images were obtained from the base of the skull through the vertex without intravenous contrast. COMPARISON:  Head CT 12/08/2018 FINDINGS: Brain: RIGHT MCA territory infarction again demonstrated and not changed from recent CT. No new cortical infarction. No intracranial hemorrhage. Ventricular hemorrhage. No midline shift or mass effect. There are periventricular and subcortical white matter hypodensities. Generalized cortical atrophy. Vascular: No hyperdense vessel or unexpected calcification. Skull: Normal. Negative for fracture or focal lesion. Sinuses/Orbits: Paranasal sinuses and mastoid air cells are clear. Orbits are clear. Other: None. IMPRESSION: 1. No change in RIGHT MCA territory infarction demonstrated on recent CT. 2. No acute intracranial findings. 3. Atrophy and white matter microvascular disease. Electronically Signed   By: Suzy Bouchard M.D.   On: 12/24/2018 13:00   Mr Brain Wo Contrast  Result Date: 12/24/2018 CLINICAL DATA:  Acute presentation with worsening weakness over  the last 3 days. Atrial fibrillation. Previous right MCA territory infarction. EXAM: MRI HEAD WITHOUT CONTRAST TECHNIQUE: Multiplanar, multiecho pulse sequences of the brain and surrounding structures were obtained without intravenous contrast. COMPARISON:  CT same day and 12/08/2018.  MRI 12/04/2018. FINDINGS: Brain: There has been extension of infarction in the right middle cerebral artery territory since the initial acute infarction of September 30th. Late subacute infarction is seen in the right parietal region and parietooccipital junction region, having undergone expected evolutionary changes less restricted diffusion. There is new brain infarction contiguous to that region along the inferior margin in the more inferior right parietal brain and in the deep insula and temporoparietal junction region. There is no evidence of significant mass effect or hemorrhage. Elsewhere, chronic small-vessel ischemic changes throughout the white matter appear similar. No evidence of midline shift, hydrocephalus or extra-axial collection. Vascular: Major vessels at the base of the brain show flow. Skull and upper cervical spine: Negative Sinuses/Orbits: Clear/normal Other: None IMPRESSION: Subacute infarction as previously seen in the right parietal cortical and subcortical brain. Interval extension of the infarction inferior to the previously seen stroke in the more inferior right parietal lobe, deep insula and temporoparietal  junction region. Evidence of hemorrhage, significant mass effect or any shift. The age of the extension is difficult to date precisely, but this additional brain involvement was definitely not present on 12/04/2018. Chronic small-vessel ischemic changes elsewhere throughout the brain as seen previously. Electronically Signed   By: Nelson Chimes M.D.   On: 12/24/2018 15:55      IMPRESSION AND PLAN:   1.  Generalized weakness.  This could be secondary to her UTI.  She will be admitted to a medical  monitored bed.  She was placed on continued antibiotic therapy with IV Rocephin and will follow urine culture and sensitivity.  Physical therapy evaluation will be obtained.  2.  Abnormal MRI with suspected extension of her old cerebral infarction.  It is unclear when this occurred.  We will continue to monitor her neurologically and obtain a physical therapy consult as well as neurology consultation by Dr. Doy Mince.  I notified her about the patient.  3.  CVA with residual left-sided hemiparesis.  We will continue aspirin and Eliquis.  4.  Atrial fibrillation.  She has controlled ventricular response.  We will continue Eliquis and Toprol-XL  5.  Dyslipidemia.  Continue Crestor.  6.  DVT prophylaxis.  Continue Eliquis    All the records are reviewed and case discussed with ED provider. The plan of care was discussed in details with the patient (and family). I answered all questions. The patient agreed to proceed with the above mentioned plan. Further management will depend upon hospital course.   CODE STATUS: Full code  TOTAL TIME TAKING CARE OF THIS PATIENT: 50 minutes.    Christel Mormon M.D on 12/24/2018 at 4:50 PM  Pager - [phone]  After 6pm go to [email or web address] - Proofreader  [employer] [place]  Office  [phone]  CC: Primary care physician; Guadalupe Maple, MD   Note: This dictation was prepared with Dragon dictation along with smaller phrase technology. Any transcriptional errors that result from this process are unintentional.

## 2018-12-24 NOTE — ED Provider Notes (Addendum)
MRI: IMPRESSION:  Subacute infarction as previously seen in the right parietal cortical and subcortical brain. Interval extension of the infarction inferior to the previously seen stroke in the more inferior right parietal lobe, deep insula and temporoparietal junction region. Evidence of hemorrhage, significant mass effect or any shift. The age of the extension is difficult to date precisely, but this additional brain involvement was definitely not present on  12/04/2018.   Additionally, urinalysis indicative of infection, which is likely contributing to the patient's presentation of generalized weakness, +/- MRI findings as above.  She does have an indwelling Foley catheter in place for retention since her stroke, urine sample was taken after Foley catheter was clamped with a new clean sample.  As such we will treat here with IV antibiotics and plan for admission.  Discussed with hospitalist.      Lilia Pro., MD 12/24/18 [phone]

## 2018-12-24 NOTE — NC FL2 (Signed)
[place] LEVEL OF CARE SCREENING TOOL     IDENTIFICATION  Patient Name: Norma Hill Birthdate: [birth year] Sex: female Admission Date (Current Location): 12/24/2018  [place] and [place] Number:  Engineering geologist and Address:  [facility], [address], [place], [place] [address]      Provider Number: [id number]  Attending Physician Name and Address:  Earleen Newport, MD  Relative Name and Phone Number:       Current Level of Care: Hospital Recommended Level of Care: [place] Prior Approval Number:    Date Approved/Denied:   PASRR Number:    Discharge Plan: SNF    Current Diagnoses: Patient Active Problem List   Diagnosis Date Noted  . Dementia ([place]) 12/19/2018  . History of stroke 12/19/2018  . Syncope and collapse 12/19/2018  . Pneumonia 12/11/2018  . CVA (cerebral vascular accident) ([place]) 12/04/2018  . Atrial fibrillation with RVR ([place]) 09/29/2018  . Pleural effusion 05/13/2017  . SOB (shortness of breath) 05/12/2017  . New onset atrial fibrillation ([place]) 02/09/2016  . Personal history of malignant neoplasm of breast   . Cancer ([place]) 10/04/2000    Orientation RESPIRATION BLADDER Height & Weight     Self, Time, Place  Normal Continent Weight: 68 kg Height:  5\' 3"  (160 cm)  BEHAVIORAL SYMPTOMS/MOOD NEUROLOGICAL BOWEL NUTRITION STATUS    (none previous cva) Continent Diet  AMBULATORY STATUS COMMUNICATION OF NEEDS Skin   Limited Assist Verbally Normal                       Personal Care Assistance Level of Assistance  Bathing, Feeding, Dressing Bathing Assistance: Limited assistance   Dressing Assistance: Limited assistance     Functional Limitations Info    Sight Info: Adequate Hearing Info: Impaired Speech Info: Adequate    SPECIAL CARE FACTORS FREQUENCY  Speech therapy, OT (By licensed OT), PT (By licensed PT)     PT Frequency: 5-7              Contractures  Contractures Info: Not present    Additional Factors Info  Code Status Code Status Info: Full             Current Medications (12/24/2018):  This is the current hospital active medication list Current Facility-Administered Medications  Medication Dose Route Frequency Provider Last Rate Last Dose  . cefTRIAXone (ROCEPHIN) 1 g in sodium chloride 0.9 % 100 mL IVPB  1 g Intravenous Once Lilia Pro., MD      . fluconazole (DIFLUCAN) tablet 150 mg  150 mg Oral Once Lilia Pro., MD       Current Outpatient Medications  Medication Sig Dispense Refill  . apixaban (ELIQUIS) 5 MG TABS tablet Take 1 tablet (5 mg total) by mouth 2 (two) times daily. 60 tablet 3  . aspirin EC 81 MG EC tablet Take 1 tablet (81 mg total) by mouth daily. 30 tablet 0  . diltiazem (CARDIZEM CD) 180 MG 24 hr capsule Take 1 capsule (180 mg total) by mouth 2 (two) times daily. 60 capsule 0  . diphenhydramine-acetaminophen (TYLENOL PM) 25-500 MG TABS tablet Take 2 tablets by mouth at bedtime as needed (pain/sleep).    . feeding supplement, ENSURE ENLIVE, (ENSURE ENLIVE) LIQD Take 237 mLs by mouth 2 (two) times daily between meals. 14220 mL 0  . metoprolol succinate (TOPROL-XL) 25 MG 24 hr tablet Take 1 tablet (25 mg total) by mouth daily. [place]  tablet 0  . Multiple Vitamin (MULTIVITAMIN WITH MINERALS) TABS tablet Take 1 tablet by mouth daily. 30 tablet 0  . polyethylene glycol (MIRALAX / GLYCOLAX) 17 g packet Take 17 g by mouth 2 (two) times daily. 60 each 0  . rosuvastatin (CRESTOR) 10 MG tablet Take 1 tablet (10 mg total) by mouth daily. 30 tablet 0  . senna (SENOKOT) 8.6 MG TABS tablet Take 2 tablets by mouth 2 (two) times daily.    . tamsulosin (FLOMAX) 0.4 MG CAPS capsule Take 1 capsule (0.4 mg total) by mouth daily. 30 capsule 0  . acetaminophen (TYLENOL) 325 MG tablet Take 2 tablets (650 mg total) by mouth every 6 (six) hours as needed for mild pain (or Fever >/= 101). (Patient not taking: Reported on 12/24/2018)     . senna-docusate (SENOKOT-S) 8.6-50 MG tablet Take 2 tablets by mouth 2 (two) times daily. (Patient not taking: Reported on 12/24/2018) 60 tablet 0     Discharge Medications: Please see discharge summary for a list of discharge medications.  Relevant Imaging Results:  Relevant Lab Results:   Additional Information Joni Reining RN   Covid specimen collected in the ED  Carlota Raspberry, Renetta Chalk, RN

## 2018-12-24 NOTE — TOC Initial Note (Addendum)
Transition of Care [facility]) - Initial/Assessment Note    Patient Details  Name: Norma Hill MRN: [id number] Date of Birth: 07/18/1945  [facility]) CM/SW Contact:    Katrina Stack, RN Phone Number: 12/24/2018, 3:43 PM  Clinical Narrative:      Patient discharged from [facility] to [facility] 10/16 after   stay for syncope/CVA/A fib. CM spoke with nurse at the skilled nursing facility to obtained information regarding reason patient sent to the ED. Patient "was normal" over the weekend and yesterday. This morning she had a dazed look. Not able to swallow her pills.  Could not lift her arms.  Sent due to concern she may have extended her CVA.  Updated attending. Head CT negative. MRI pending. There is a consult for care management for higher level of care.  Explained that patient's current state of weakness is new and at present SNF should meet patient's needs.  Suggested canceling physical therapy consult ordered to be performed in the ED. Covid test has been ordered.         Patient Goals and CMS Choice        Expected Discharge Plan and Services                                                Prior Living Arrangements/Services                       Activities of Daily Living      Permission Sought/Granted                  Emotional Assessment              Admission diagnosis:  weakness Patient Active Problem List   Diagnosis Date Noted  . Dementia ([place]) 12/19/2018  . History of stroke 12/19/2018  . Syncope and collapse 12/19/2018  . Pneumonia 12/11/2018  . CVA (cerebral vascular accident) ([place]) 12/04/2018  . Atrial fibrillation with RVR ([place]) 09/29/2018  . Pleural effusion 05/13/2017  . SOB (shortness of breath) 05/12/2017  . New onset atrial fibrillation ([place]) 02/09/2016  . Personal history of malignant neoplasm of breast   . Cancer ([place]) 10/04/2000   PCP:  Guadalupe Maple, MD Pharmacy:   [facility]  [place] [address] Phone: [phone] Fax: [phone]     Social Determinants of Health (SDOH) Interventions    Readmission Risk Interventions No flowsheet data found.

## 2018-12-24 NOTE — ED Triage Notes (Signed)
Pt in via [facility] from [facility]; complaints of increasing generalized weakness since Saturday, per EMS pt is ambulatory at baseline but has not been ambulatory the past few days.  Pt confused at baseline, alert to self.  Recent CVA w/ left side deficits.  NAD noted at this time.

## 2018-12-24 NOTE — ED Notes (Signed)
Pt alert   Denies any pain at this time.  Family with pt.

## 2018-12-25 DIAGNOSIS — I639 Cerebral infarction, unspecified: Secondary | ICD-10-CM

## 2018-12-25 DIAGNOSIS — Z9049 Acquired absence of other specified parts of digestive tract: Secondary | ICD-10-CM | POA: Diagnosis not present

## 2018-12-25 DIAGNOSIS — I69354 Hemiplegia and hemiparesis following cerebral infarction affecting left non-dominant side: Secondary | ICD-10-CM | POA: Diagnosis not present

## 2018-12-25 DIAGNOSIS — Z853 Personal history of malignant neoplasm of breast: Secondary | ICD-10-CM | POA: Diagnosis not present

## 2018-12-25 DIAGNOSIS — E785 Hyperlipidemia, unspecified: Secondary | ICD-10-CM | POA: Diagnosis present

## 2018-12-25 DIAGNOSIS — Z7982 Long term (current) use of aspirin: Secondary | ICD-10-CM | POA: Diagnosis not present

## 2018-12-25 DIAGNOSIS — G9340 Encephalopathy, unspecified: Secondary | ICD-10-CM | POA: Diagnosis present

## 2018-12-25 DIAGNOSIS — Z7901 Long term (current) use of anticoagulants: Secondary | ICD-10-CM | POA: Diagnosis not present

## 2018-12-25 DIAGNOSIS — Z79899 Other long term (current) drug therapy: Secondary | ICD-10-CM | POA: Diagnosis not present

## 2018-12-25 DIAGNOSIS — I10 Essential (primary) hypertension: Secondary | ICD-10-CM | POA: Diagnosis present

## 2018-12-25 DIAGNOSIS — Z803 Family history of malignant neoplasm of breast: Secondary | ICD-10-CM | POA: Diagnosis not present

## 2018-12-25 DIAGNOSIS — L8990 Pressure ulcer of unspecified site, unspecified stage: Secondary | ICD-10-CM | POA: Insufficient documentation

## 2018-12-25 DIAGNOSIS — I63511 Cerebral infarction due to unspecified occlusion or stenosis of right middle cerebral artery: Secondary | ICD-10-CM | POA: Diagnosis present

## 2018-12-25 DIAGNOSIS — R630 Anorexia: Secondary | ICD-10-CM | POA: Diagnosis present

## 2018-12-25 DIAGNOSIS — N390 Urinary tract infection, site not specified: Secondary | ICD-10-CM | POA: Diagnosis present

## 2018-12-25 DIAGNOSIS — F0390 Unspecified dementia without behavioral disturbance: Secondary | ICD-10-CM | POA: Diagnosis present

## 2018-12-25 DIAGNOSIS — E860 Dehydration: Secondary | ICD-10-CM | POA: Diagnosis present

## 2018-12-25 DIAGNOSIS — Z20828 Contact with and (suspected) exposure to other viral communicable diseases: Secondary | ICD-10-CM | POA: Diagnosis present

## 2018-12-25 DIAGNOSIS — I4891 Unspecified atrial fibrillation: Secondary | ICD-10-CM | POA: Diagnosis present

## 2018-12-25 DIAGNOSIS — Z66 Do not resuscitate: Secondary | ICD-10-CM | POA: Diagnosis present

## 2018-12-25 DIAGNOSIS — R531 Weakness: Secondary | ICD-10-CM | POA: Diagnosis present

## 2018-12-25 LAB — URINE CULTURE: Culture: >=100000 — AB

## 2018-12-25 LAB — CBC
HCT: 36.3 % (ref 36.0–46.0)
Hemoglobin: 11.4 g/dL — ABNORMAL LOW (ref 12.0–15.0)
MCH: 27.7 pg (ref 26.0–34.0)
MCHC: 31.4 g/dL (ref 30.0–36.0)
MCV: 88.3 fL (ref 80.0–100.0)
Platelets: 189 10*3/uL (ref 150–400)
RBC: 4.11 MIL/uL (ref 3.87–5.11)
RDW: 15.3 % (ref 11.5–15.5)
WBC: 6 10*3/uL (ref 4.0–10.5)
nRBC: 0 % (ref 0.0–0.2)

## 2018-12-25 LAB — BASIC METABOLIC PANEL
Anion gap: 9 (ref 5–15)
BUN: 18 mg/dL (ref 8–23)
CO2: 25 mmol/L (ref 22–32)
Calcium: 9 mg/dL (ref 8.9–10.3)
Chloride: 110 mmol/L (ref 98–111)
Creatinine, Ser: 0.56 mg/dL (ref 0.44–1.00)
GFR calc Af Amer: >60 mL/min (ref >60)
GFR calc non Af Amer: >60 mL/min (ref >60)
Glucose, Bld: 100 mg/dL — ABNORMAL HIGH (ref 70–99)
Potassium: 3.2 mmol/L — ABNORMAL LOW (ref 3.5–5.1)
Sodium: 144 mmol/L (ref 135–145)

## 2018-12-25 LAB — SARS CORONAVIRUS 2 (TAT 6-24 HRS): SARS Coronavirus 2: NEGATIVE

## 2018-12-25 MED ORDER — CHLORHEXIDINE GLUCONATE CLOTH 2 % EX PADS
6.0000 | MEDICATED_PAD | Freq: Every day | CUTANEOUS | Status: DC
  Administered 2018-12-25: 18:00:00 6 via TOPICAL

## 2018-12-25 MED ORDER — FLUCONAZOLE 50 MG PO TABS
150.0000 mg | ORAL_TABLET | Freq: Every day | ORAL | Status: DC
  Administered 2018-12-25: 150 mg via ORAL
  Filled 2018-12-25: qty 1

## 2018-12-25 MED ORDER — POTASSIUM CHLORIDE 20 MEQ PO PACK
40.0000 meq | PACK | Freq: Two times a day (BID) | ORAL | Status: AC
  Administered 2018-12-25 (×2): 40 meq via ORAL
  Filled 2018-12-25 (×2): qty 2

## 2018-12-25 NOTE — Consult Note (Signed)
Referring Physician: Mansy    Chief Complaint: weakness  HPI: Norma Hill is an 73 y.o. 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  She has not been ambulating over the last several days and she is ambulatory at baseline.   Patient unable to provide any history.  Date last known well: Date: 12/21/2018 Time last known well: Unable to determine tPA Given: No: Recent infarct, on Eliquis, outside time window  Past Medical History:  Diagnosis Date  . A-fib ([place])   . Bowel trouble 2012   ocasionally  . Cancer ([place]) 10/2000   pt had 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 Pt then had a repeat excision on 11/16/2000  . Hypertension 2006  . Personal history of malignant neoplasm of breast 2002  . Solitary cyst of breast 2012  . Special screening for malignant neoplasms, colon     Past Surgical History:  Procedure Laterality Date  . BREAST BIOPSY Right 2002  . BREAST SURGERY Right 2002   wide excision and sn x4 done 10/29/00 with repeat excision done on 11/16/2000 with post operative radiation treatment and 5 years of tamoxifen with completion in 2007  . CHOLECYSTECTOMY  2002  . COLONOSCOPY  2008  . DILATION AND CURETTAGE OF UTERUS  1990,s  . ELECTROPHYSIOLOGIC STUDY N/A 03/16/2016   Procedure: CARDIOVERSION;  Surgeon: Isaias Cowman, MD;  Location: [facility] ORS;  Service: Cardiovascular;  Laterality: N/A;    Family History  Problem Relation Age of Onset  . Cancer Other        breast, relationship not listed   Social History:  reports that she has never smoked. She has never used smokeless tobacco. She reports that she does not drink alcohol or use drugs.  Allergies: No Known Allergies  Medications:  I have reviewed the patient's  current medications. Prior to Admission:  Medications Prior to Admission  Medication Sig Dispense Refill Last Dose  . apixaban (ELIQUIS) 5 MG TABS tablet Take 1 tablet (5 mg total) by mouth 2 (two) times daily. 60 tablet 3 24+ hours at Unknown  . aspirin EC 81 MG EC tablet Take 1 tablet (81 mg total) by mouth daily. 30 tablet 0 24+ hours at Unknown  . diltiazem (CARDIZEM CD) 180 MG 24 hr capsule Take 1 capsule (180 mg total) by mouth 2 (two) times daily. 60 capsule 0 24+ hours at Unknown  . diphenhydramine-acetaminophen (TYLENOL PM) 25-500 MG TABS tablet Take 2 tablets by mouth at bedtime as needed (pain/sleep).   Unknown at PRN  . feeding supplement, ENSURE ENLIVE, (ENSURE ENLIVE) LIQD Take 237 mLs by mouth 2 (two) times daily between meals. 14220 mL 0   . metoprolol succinate (TOPROL-XL) 25 MG 24 hr tablet Take 1 tablet (25 mg total) by mouth daily. 30 tablet 0 24+ hours at Unknown  . Multiple Vitamin (MULTIVITAMIN WITH MINERALS) TABS tablet Take 1 tablet by mouth daily. 30 tablet 0 24+ hours at Unknown  . polyethylene glycol (MIRALAX / GLYCOLAX) 17 g packet Take 17 g by mouth 2 (two) times daily. 60 each 0 24+ hours at Unknown  . rosuvastatin (CRESTOR) 10 MG tablet Take 1 tablet (10 mg total) by mouth daily. 30 tablet 0 24+ hours at Unknown  . senna (SENOKOT) 8.6 MG TABS tablet Take 2 tablets  by mouth 2 (two) times daily.   24+ hours at Unknown  . tamsulosin (FLOMAX) 0.4 MG CAPS capsule Take 1 capsule (0.4 mg total) by mouth daily. 30 capsule 0 24+ hours at Unknown  . acetaminophen (TYLENOL) 325 MG tablet Take 2 tablets (650 mg total) by mouth every 6 (six) hours as needed for mild pain (or Fever >/= 101). (Patient not taking: Reported on 12/24/2018)   Not Taking at Unknown time  . senna-docusate (SENOKOT-S) 8.6-50 MG tablet Take 2 tablets by mouth 2 (two) times daily. (Patient not taking: Reported on 12/24/2018) 60 tablet 0 Not Taking at Unknown time   Scheduled: . apixaban  5 mg Oral BID  .  aspirin  81 mg Oral Daily  . Chlorhexidine Gluconate Cloth  6 each Topical Daily  . diltiazem  180 mg Oral BID  . feeding supplement (ENSURE ENLIVE)  237 mL Oral BID BM  . fluconazole  150 mg Oral Daily  . metoprolol succinate  25 mg Oral Daily  . multivitamin with minerals  1 tablet Oral Daily  . polyethylene glycol  17 g Oral BID  . rosuvastatin  10 mg Oral Daily  . senna  2 tablet Oral BID  . tamsulosin  0.4 mg Oral Daily    ROS: History obtained from the patient  General ROS: negative for - chills, fatigue, fever, night sweats, weight gain or weight loss Psychological ROS: negative for - behavioral disorder, hallucinations, memory difficulties, mood swings or suicidal ideation Ophthalmic ROS: negative for - blurry vision, double vision, eye pain or loss of vision ENT ROS: negative for - epistaxis, nasal discharge, oral lesions, sore throat, tinnitus or vertigo Allergy and Immunology ROS: negative for - hives or itchy/watery eyes Hematological and Lymphatic ROS: negative for - bleeding problems, bruising or swollen lymph nodes Endocrine ROS: negative for - galactorrhea, hair pattern changes, polydipsia/polyuria or temperature intolerance Respiratory ROS: negative for - cough, hemoptysis, shortness of breath or wheezing Cardiovascular ROS: negative for - chest pain, dyspnea on exertion, edema or irregular heartbeat Gastrointestinal ROS: negative for - abdominal pain, diarrhea, hematemesis, nausea/vomiting or stool incontinence Genito-Urinary ROS: dysuria Musculoskeletal ROS: weakness Neurological ROS: as noted in HPI Dermatological ROS: negative for rash and skin lesion changes  Physical Examination: Blood pressure 133/84, pulse 87, temperature (!) 97.4 F (36.3 C), temperature source Oral, resp. rate (!) 21, height 5\' 3"  (1.6 m), weight 68 kg, SpO2 96 %.  HEENT-  Normocephalic, no lesions, without obvious abnormality.  Normal external eye and conjunctiva.  Normal TM's  bilaterally.  Normal auditory canals and external ears. Normal external nose, mucus membranes and septum.  Normal pharynx. Cardiovascular- S1, S2 normal, pulses palpable throughout   Lungs- chest clear, no wheezing, rales, normal symmetric air entry Abdomen- soft, non-tender; bowel sounds normal; no masses,  no organomegaly Extremities- no edema Lymph-no adenopathy palpable Musculoskeletal-no joint tenderness, deformity or swelling Skin-warm and dry, no hyperpigmentation, vitiligo, or suspicious lesions  Neurological Examination   Mental Status: Alert, oriented to name.  Reports age is 25 and that it is 64.  Speech fluent without evidence of aphasia.  Able to follow 3 step commands without difficulty.  Left neglect. Cranial Nerves: II: Discs flat bilaterally; Visual fields grossly normal, pupils equal, round, reactive to light and accommodation III,IV, VI: ptosis not present, extra-ocular motions intact bilaterally V,VII: mild left facial droop, facial light touch sensation normal bilaterally VIII: hearing normal bilaterally IX,X: gag reflex present XI: bilateral shoulder shrug XII: midline tongue extension Motor: Right :  Upper extremity   5/5    Left:     Upper extremity   5/-5, no drift  Lower extremity   5/5     Lower extremity   5-/5 Tone and bulk:normal tone throughout; no atrophy noted Sensory: Pinprick and light touch intact throughout, bilaterally Deep Tendon Reflexes: Symmetric throughout Plantars: Right: mute   Left: mute Cerebellar: Normal finger-to-nose and normal heel-to-shin testing bilaterally Gait: Narrow based with small, shuffling steps    Laboratory Studies:  Basic Metabolic Panel: Recent Labs  Lab 12/20/18 0548 12/24/18 1227 12/25/18 0450  NA 141 142 144  K 3.7 3.5 3.2*  CL 105 104 110  CO2 29 29 25   GLUCOSE 101* 110* 100*  BUN 24* 21 18  CREATININE 0.66 0.78 0.56  CALCIUM 9.1 9.7 9.0    Liver Function Tests: Recent Labs  Lab 12/24/18 1227   AST 54*  ALT 56*  ALKPHOS 86  BILITOT 1.0  PROT 6.3*  ALBUMIN 3.8   No results for input(s): LIPASE, AMYLASE in the last 168 hours. No results for input(s): AMMONIA in the last 168 hours.  CBC: Recent Labs  Lab 12/19/18 0519 12/24/18 1227 12/25/18 0450  WBC 5.9 6.7 6.0  NEUTROABS  --  4.5  --   HGB 12.3 12.7 11.4*  HCT 38.7 39.5 36.3  MCV 85.6 87.8 88.3  PLT 220 201 189    Cardiac Enzymes: No results for input(s): CKTOTAL, CKMB, CKMBINDEX, TROPONINI in the last 168 hours.  BNP: Invalid input(s): POCBNP  CBG: No results for input(s): GLUCAP in the last 168 hours.  Microbiology: Results for orders placed or performed during the hospital encounter of 12/24/18  SARS CORONAVIRUS 2 (TAT 6-24 HRS) Nasopharyngeal Nasopharyngeal Swab     Status: None   Collection Time: 12/24/18  4:41 PM   Specimen: Nasopharyngeal Swab  Result Value Ref Range Status   SARS Coronavirus 2 NEGATIVE NEGATIVE Final    Comment: (NOTE) SARS-CoV-2 target nucleic acids are NOT DETECTED. The SARS-CoV-2 RNA is generally detectable in upper and lower respiratory specimens during the acute phase of infection. Negative results do not preclude SARS-CoV-2 infection, do not rule out co-infections with other pathogens, and should not be used as the sole basis for treatment or other patient management decisions. Negative results must be combined with clinical observations, patient history, and epidemiological information. The expected result is Negative. Fact Sheet for Patients: SugarRoll.be Fact Sheet for Healthcare Providers: https://www.woods-mathews.com/ This test is not yet approved or cleared by the [place] FDA and  has been authorized for detection and/or diagnosis of SARS-CoV-2 by FDA under an Emergency Use Authorization (EUA). This EUA will remain  in effect (meaning this test can be used) for the duration of the COVID-19 declaration under Section  56 4(b)(1) of the Act, 21 U.S.C. section 360bbb-3(b)(1), unless the authorization is terminated or revoked sooner. Performed at [facility], [place] [address], [place], [place] [address]     Coagulation Studies: No results for input(s): LABPROT, INR in the last 72 hours.  Urinalysis:  Recent Labs  Lab 12/24/18 1227  COLORURINE AMBER*  LABSPEC 1.025  PHURINE 5.0  GLUCOSEU NEGATIVE  HGBUR LARGE*  BILIRUBINUR NEGATIVE  KETONESUR 20*  PROTEINUR 100*  NITRITE NEGATIVE  LEUKOCYTESUR LARGE*    Lipid Panel:    Component Value Date/Time   CHOL 186 12/05/2018 0544   TRIG 54 12/05/2018 0544   HDL 62 12/05/2018 0544   CHOLHDL 3.0 12/05/2018 0544   VLDL 11 12/05/2018  0544   LDLCALC 113 (H) 12/05/2018 0544    HgbA1C:  Lab Results  Component Value Date   HGBA1C 5.2 12/05/2018    Urine Drug Screen:  No results found for: LABOPIA, COCAINSCRNUR, LABBENZ, AMPHETMU, THCU, LABBARB  Alcohol Level: No results for input(s): ETH in the last 168 hours.  Other results: EKG: atrial fibrillation, rate 84 bpm.  Imaging: Dg Chest 1 View  Result Date: 12/24/2018 CLINICAL DATA:  Weakness. EXAM: CHEST  1 VIEW COMPARISON:  December 14, 2018. FINDINGS: Stable cardiomegaly. No pneumothorax or pleural effusion is noted. Both lungs are clear. The visualized skeletal structures are unremarkable. IMPRESSION: No active disease. Electronically Signed   By: Marijo Conception M.D.   On: 12/24/2018 13:07   Ct Head Wo Contrast  Result Date: 12/24/2018 CLINICAL DATA:  Generalized weakness and confusion.Altered level of consciousness (LOC), unexplained EXAM: CT HEAD WITHOUT CONTRAST TECHNIQUE: Contiguous axial images were obtained from the base of the skull through the vertex without intravenous contrast. COMPARISON:  Head CT 12/08/2018 FINDINGS: Brain: RIGHT MCA territory infarction again demonstrated and not changed from recent CT. No new cortical infarction. No intracranial hemorrhage. Ventricular  hemorrhage. No midline shift or mass effect. There are periventricular and subcortical white matter hypodensities. Generalized cortical atrophy. Vascular: No hyperdense vessel or unexpected calcification. Skull: Normal. Negative for fracture or focal lesion. Sinuses/Orbits: Paranasal sinuses and mastoid air cells are clear. Orbits are clear. Other: None. IMPRESSION: 1. No change in RIGHT MCA territory infarction demonstrated on recent CT. 2. No acute intracranial findings. 3. Atrophy and white matter microvascular disease. Electronically Signed   By: Suzy Bouchard M.D.   On: 12/24/2018 13:00   Mr Brain Wo Contrast  Result Date: 12/24/2018 CLINICAL DATA:  Acute presentation with worsening weakness over the last 3 days. Atrial fibrillation. Previous right MCA territory infarction. EXAM: MRI HEAD WITHOUT CONTRAST TECHNIQUE: Multiplanar, multiecho pulse sequences of the brain and surrounding structures were obtained without intravenous contrast. COMPARISON:  CT same day and 12/08/2018.  MRI 12/04/2018. FINDINGS: Brain: There has been extension of infarction in the right middle cerebral artery territory since the initial acute infarction of September 30th. Late subacute infarction is seen in the right parietal region and parietooccipital junction region, having undergone expected evolutionary changes less restricted diffusion. There is new brain infarction contiguous to that region along the inferior margin in the more inferior right parietal brain and in the deep insula and temporoparietal junction region. There is no evidence of significant mass effect or hemorrhage. Elsewhere, chronic small-vessel ischemic changes throughout the white matter appear similar. No evidence of midline shift, hydrocephalus or extra-axial collection. Vascular: Major vessels at the base of the brain show flow. Skull and upper cervical spine: Negative Sinuses/Orbits: Clear/normal Other: None IMPRESSION: Subacute infarction as  previously seen in the right parietal cortical and subcortical brain. Interval extension of the infarction inferior to the previously seen stroke in the more inferior right parietal lobe, deep insula and temporoparietal junction region. Evidence of hemorrhage, significant mass effect or any shift. The age of the extension is difficult to date precisely, but this additional brain involvement was definitely not present on 12/04/2018. Chronic small-vessel ischemic changes elsewhere throughout the brain as seen previously. Electronically Signed   By: Nelson Chimes M.D.   On: 12/24/2018 15:55    Assessment: 73 y.o. 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  Found to have a UTI.  MRI of the brain performed and reviewed.  MRI shows extension of subacute right MCA infarct into the inferior parietal lobe and deep insula.  Patient on Eliquis and ASA.  Recent stroke work up within the past 30 days was unremarkable and included carotid dopplers showing no evidence of hemodynamically significant stenosis and an echocardiogram showing no cardiac source of emboli with an EF of 40-45%.  A1c 5.2, LDL 113.  Repeat work up not indicated at this time.   Patient with some low blood pressures at admission and some history of hypotension.  This may have led to the extension in infarct size.     Stroke Risk Factors - atrial fibrillation and hypertension  Plan: 1. Continue ASA, Eliquis and statin 2. PT consult, OT consult, Speech consult 3. Patient orthostatic on last evaluation (12/15/2018).  Would recheck orthostatic vitals 4. Telemetry monitoring 5. Frequent neuro checks   Alexis Goodell, MD Neurology [phone] 12/25/2018, 10:58 AM

## 2018-12-25 NOTE — TOC Progression Note (Signed)
Transition of Care [facility]) - Progression Note    Patient Details  Name: Kyrene Longan MRN: [id number] Date of Birth: 08-Apr-1945  Transition of Care [facility]) CM/SW [place], LCSW Phone Number: 12/25/2018, 3:30 PM  Clinical Narrative: CSW met with patient and her husband to discuss return to [facility] once medically stable.     Expected Discharge Plan: [place]    Expected Discharge Plan and Services Expected Discharge Plan: [place] Choice: [place] arrangements for the past 2 months: Single Family Home, [place]                                       Social Determinants of Health (SDOH) Interventions    Readmission Risk Interventions No flowsheet data found.

## 2018-12-25 NOTE — Progress Notes (Signed)
Unable to complete profile. Pt confused at baseline, poor historian.

## 2018-12-25 NOTE — Progress Notes (Signed)
[place] [facility] [place] NAME: Norma Hill    MR#:  [id number]  DATE OF BIRTH:  [birth year]  SUBJECTIVE:  patient came in with increasing confusion and weakness with not able to ambulate like before. Husband in the room. Patient is alert and oriented to self and person.  REVIEW OF SYSTEMS:   Review of Systems  Unable to perform ROS: Mental status change   Tolerating Diet:yes Tolerating PT: pending  DRUG ALLERGIES:  No Known Allergies  VITALS:  Blood pressure 133/84, pulse 87, temperature (!) 97.4 F (36.3 C), temperature source Oral, resp. rate (!) 21, height 5\' 3"  (1.6 m), weight 68 kg, SpO2 96 %.  PHYSICAL EXAMINATION:   Physical Exam  GENERAL:  73 y.o.-year-old patient lying in the bed with no acute distress.  EYES: Pupils equal, round, reactive to light and accommodation. No scleral icterus. Extraocular muscles intact.  HEENT: Head atraumatic, normocephalic. Oropharynx and nasopharynx clear.  NECK:  Supple, no jugular venous distention. No thyroid enlargement, no tenderness.  LUNGS: Normal breath sounds bilaterally, no wheezing, rales, rhonchi. No use of accessory muscles of respiration.  CARDIOVASCULAR: S1, S2 normal. No murmurs, rubs, or gallops.  ABDOMEN: Soft, nontender, nondistended. Bowel sounds present. No organomegaly or mass. Chronic Foley+ EXTREMITIES: No cyanosis, clubbing or edema b/l.    NEUROLOGIC: Cranial nerves II through XII are intact. No focal Motor or sensory deficits b/l. Mild left upper and lower extremity weakness PSYCHIATRIC:  patient is alert and oriented x 2 SKIN: No obvious rash, lesion, or ulcer.   LABORATORY PANEL:  CBC Recent Labs  Lab 12/25/18 0450  WBC 6.0  HGB 11.4*  HCT 36.3  PLT 189    Chemistries  Recent Labs  Lab 12/24/18 1227 12/25/18 0450  NA 142 144  K 3.5 3.2*  CL 104 110  CO2 29 25  GLUCOSE 110* 100*  BUN 21 18  CREATININE 0.78 0.56  CALCIUM 9.7 9.0  AST 54*  --    ALT 56*  --   ALKPHOS 86  --   BILITOT 1.0  --    Cardiac Enzymes No results for input(s): TROPONINI in the last 168 hours. RADIOLOGY:  Dg Chest 1 View  Result Date: 12/24/2018 CLINICAL DATA:  Weakness. EXAM: CHEST  1 VIEW COMPARISON:  December 14, 2018. FINDINGS: Stable cardiomegaly. No pneumothorax or pleural effusion is noted. Both lungs are clear. The visualized skeletal structures are unremarkable. IMPRESSION: No active disease. Electronically Signed   By: Marijo Conception M.D.   On: 12/24/2018 13:07   Ct Head Wo Contrast  Result Date: 12/24/2018 CLINICAL DATA:  Generalized weakness and confusion.Altered level of consciousness (LOC), unexplained EXAM: CT HEAD WITHOUT CONTRAST TECHNIQUE: Contiguous axial images were obtained from the base of the skull through the vertex without intravenous contrast. COMPARISON:  Head CT 12/08/2018 FINDINGS: Brain: RIGHT MCA territory infarction again demonstrated and not changed from recent CT. No new cortical infarction. No intracranial hemorrhage. Ventricular hemorrhage. No midline shift or mass effect. There are periventricular and subcortical white matter hypodensities. Generalized cortical atrophy. Vascular: No hyperdense vessel or unexpected calcification. Skull: Normal. Negative for fracture or focal lesion. Sinuses/Orbits: Paranasal sinuses and mastoid air cells are clear. Orbits are clear. Other: None. IMPRESSION: 1. No change in RIGHT MCA territory infarction demonstrated on recent CT. 2. No acute intracranial findings. 3. Atrophy and white matter microvascular disease. Electronically Signed   By: Suzy Bouchard M.D.   On: 12/24/2018 13:00  Mr Brain Wo Contrast  Result Date: 12/24/2018 CLINICAL DATA:  Acute presentation with worsening weakness over the last 3 days. Atrial fibrillation. Previous right MCA territory infarction. EXAM: MRI HEAD WITHOUT CONTRAST TECHNIQUE: Multiplanar, multiecho pulse sequences of the brain and surrounding structures  were obtained without intravenous contrast. COMPARISON:  CT same day and 12/08/2018.  MRI 12/04/2018. FINDINGS: Brain: There has been extension of infarction in the right middle cerebral artery territory since the initial acute infarction of September 30th. Late subacute infarction is seen in the right parietal region and parietooccipital junction region, having undergone expected evolutionary changes less restricted diffusion. There is new brain infarction contiguous to that region along the inferior margin in the more inferior right parietal brain and in the deep insula and temporoparietal junction region. There is no evidence of significant mass effect or hemorrhage. Elsewhere, chronic small-vessel ischemic changes throughout the white matter appear similar. No evidence of midline shift, hydrocephalus or extra-axial collection. Vascular: Major vessels at the base of the brain show flow. Skull and upper cervical spine: Negative Sinuses/Orbits: Clear/normal Other: None IMPRESSION: Subacute infarction as previously seen in the right parietal cortical and subcortical brain. Interval extension of the infarction inferior to the previously seen stroke in the more inferior right parietal lobe, deep insula and temporoparietal junction region. Evidence of hemorrhage, significant mass effect or any shift. The age of the extension is difficult to date precisely, but this additional brain involvement was definitely not present on 12/04/2018. Chronic small-vessel ischemic changes elsewhere throughout the brain as seen previously. Electronically Signed   By: Nelson Chimes M.D.   On: 12/24/2018 15:55   ASSESSMENT AND PLAN:  Norma Hill  is a 73 y.o. Caucasian female with a known history of multiple medical problems that will be mentioned below including atrial fibrillation and right breast cancer, presented to emergency room with an onset of generalized weakness since Saturday  1.  Generalized weakness? altered mental  status suspected due to UTI and progression of her previous stroke as noted on new MRI.   -patient's RN IV Rocephin if urine cultures negative DC antibiotics.  -Patient is chronic Foley catheter for several weeks. Will try to get it changed once we find out when was the last catheter placed. She will need urology appointment has not been able to see urology as outpatient -UA shows budding yeast on Diflucan.  2.  Abnormal MRI with suspected extension of her old cerebral infarction.  It is unclear when this occurred.  W -seen by Dr. Doy Mince. No further workup needed. Continue aspirin and eliquis as before. -Physical therapy to see patient -patient overall is very weak. She has significant confusion and poor PO intake. -Will continue to monitor here for next couple days  3.  CVA with residual left-sided hemiparesis. -  We will continue aspirin and Eliquis.  4.  Atrial fibrillation.  She has controlled ventricular response.  We will continue Eliquis and Toprol-XL  5.  Dyslipidemia.  Continue Crestor.  6.  DVT prophylaxis.  Continue Eliquis  Case manager for discharge planning. Patient is from [facility] facility  Case discussed with Care Management/Social Worker. Management plans discussed with the patient, family and they are in agreement.  CODE STATUS: DNR  DVT Prophylaxis: eliquis  TOTAL TIME TAKING CARE OF THIS PATIENT: **30* minutes.  >50% time spent on counselling and coordination of care  POSSIBLE D/C IN *1-2* DAYS, DEPENDING ON CLINICAL CONDITION.  Note: This dictation was prepared with Dragon dictation along with smaller phrase technology.  Any transcriptional errors that result from this process are unintentional.  Fritzi Mandes M.D on 12/25/2018 at 3:00 PM  Between 7am to 6pm - Pager - [phone]  After 6pm go to [email or web address] - password [email or web address] [facility]  Office  [phone]  CC: Primary care physician; Guadalupe Maple, MDPatient ID:  [name], female   DOB: 1946/03/02, 73 y.o.   MRN: [id number]

## 2018-12-25 NOTE — NC FL2 (Signed)
[place] LEVEL OF CARE SCREENING TOOL     IDENTIFICATION  Patient Name: Norma Hill Birthdate: 1945-05-07 Sex: female Admission Date (Current Location): 12/24/2018  [place] and [place] Number:  Engineering geologist and Address:  [facility], [address], [place], [place] [address]      Provider Number: [id number]  Attending Physician Name and Address:  Fritzi Mandes, MD  Relative Name and Phone Number:       Current Level of Care: Hospital Recommended Level of Care: [place] Prior Approval Number:    Date Approved/Denied:   PASRR Number: [id number]  Discharge Plan: SNF    Current Diagnoses: Patient Active Problem List   Diagnosis Date Noted  . Pressure injury of skin 12/25/2018  . Acute encephalopathy 12/25/2018  . UTI (urinary tract infection) 12/24/2018  . Dementia ([place]) 12/19/2018  . History of stroke 12/19/2018  . Syncope and collapse 12/19/2018  . Pneumonia 12/11/2018  . CVA (cerebral vascular accident) ([place]) 12/04/2018  . Atrial fibrillation with RVR ([place]) 09/29/2018  . Pleural effusion 05/13/2017  . SOB (shortness of breath) 05/12/2017  . New onset atrial fibrillation ([place]) 02/09/2016  . Personal history of malignant neoplasm of breast   . Cancer ([place]) 10/04/2000    Orientation RESPIRATION BLADDER Height & Weight     Self, Place  Normal Continent, Indwelling catheter Weight: 150 lb (68 kg) Height:  5\' 3"  (160 cm)  BEHAVIORAL SYMPTOMS/MOOD NEUROLOGICAL BOWEL NUTRITION STATUS  (None) (None) Continent Diet(Heart healthy. Please CUT tougher meats(chicken, beef) and add gravy to moisten. Likes Yogurt at breakfast and Lunch meals.)  AMBULATORY STATUS COMMUNICATION OF NEEDS Skin   Limited Assist Verbally Skin abrasions, Bruising, PU Stage and Appropriate Care   PU Stage 2 Dressing: (Mid vertebral column: Foam (Lift to assess every shift).)                   Personal Care Assistance Level  of Assistance  Bathing, Feeding, Dressing Bathing Assistance: Limited assistance   Dressing Assistance: Limited assistance     Functional Limitations Info  Sight, Hearing, Speech Sight Info: Adequate Hearing Info: Adequate Speech Info: Adequate    SPECIAL CARE FACTORS FREQUENCY  PT (By licensed PT)     PT Frequency: 5 x week              Contractures Contractures Info: Not present    Additional Factors Info  Code Status, Allergies Code Status Info: DNR Allergies Info: NKDA           Current Medications (12/25/2018):  This is the current hospital active medication list Current Facility-Administered Medications  Medication Dose Route Frequency Provider Last Rate Last Dose  . 0.9 %  sodium chloride infusion   Intravenous Continuous Mansy, Jan A, MD 100 mL/hr at 12/25/18 0603    . acetaminophen (TYLENOL) tablet 650 mg  650 mg Oral Q6H PRN Mansy, Jan A, MD       Or  . acetaminophen (TYLENOL) suppository 650 mg  650 mg Rectal Q6H PRN Mansy, Jan A, MD      . diphenhydrAMINE (BENADRYL) capsule 50 mg  50 mg Oral QHS PRN Benita Gutter, RPH       And  . acetaminophen (TYLENOL) tablet 1,000 mg  1,000 mg Oral QHS PRN Benita Gutter, RPH      . apixaban (ELIQUIS) tablet 5 mg  5 mg Oral BID Mansy, Jan A, MD   5 mg at 12/25/18 0902  .  aspirin EC tablet 81 mg  81 mg Oral Daily Mansy, Jan A, MD   81 mg at 12/25/18 0901  . cefTRIAXone (ROCEPHIN) 1 g in sodium chloride 0.9 % 100 mL IVPB  1 g Intravenous Q24H Benita Gutter, RPH      . Chlorhexidine Gluconate Cloth 2 % PADS 6 each  6 each Topical Daily Mansy, Jan A, MD      . diltiazem (CARDIZEM CD) 24 hr capsule 180 mg  180 mg Oral BID Mansy, Jan A, MD   180 mg at 12/25/18 0902  . feeding supplement (ENSURE ENLIVE) (ENSURE ENLIVE) liquid 237 mL  237 mL Oral BID BM Mansy, Jan A, MD      . fluconazole (DIFLUCAN) tablet 150 mg  150 mg Oral Daily Harrie Foreman, MD      . magnesium hydroxide (MILK OF MAGNESIA) suspension 30 mL   30 mL Oral Daily PRN Mansy, Jan A, MD      . metoprolol succinate (TOPROL-XL) 24 hr tablet 25 mg  25 mg Oral Daily Mansy, Jan A, MD   25 mg at 12/25/18 0901  . multivitamin with minerals tablet 1 tablet  1 tablet Oral Daily Mansy, Jan A, MD   1 tablet at 12/25/18 0901  . ondansetron (ZOFRAN) tablet 4 mg  4 mg Oral Q6H PRN Mansy, Jan A, MD       Or  . ondansetron [facility]) injection 4 mg  4 mg Intravenous Q6H PRN Mansy, Jan A, MD      . polyethylene glycol (MIRALAX / GLYCOLAX) packet 17 g  17 g Oral BID Mansy, Jan A, MD   17 g at 12/25/18 0901  . potassium chloride (KLOR-CON) packet 40 mEq  40 mEq Oral BID Fritzi Mandes, MD      . rosuvastatin (CRESTOR) tablet 10 mg  10 mg Oral Daily Mansy, Jan A, MD   10 mg at 12/25/18 0902  . senna (SENOKOT) tablet 17.2 mg  2 tablet Oral BID Mansy, Jan A, MD   17.2 mg at 12/25/18 0902  . tamsulosin (FLOMAX) capsule 0.4 mg  0.4 mg Oral Daily Mansy, Jan A, MD   0.4 mg at 12/25/18 0902  . traZODone (DESYREL) tablet 25 mg  25 mg Oral QHS PRN Mansy, Arvella Merles, MD         Discharge Medications: Please see discharge summary for a list of discharge medications.  Relevant Imaging Results:  Relevant Lab Results:   Additional Information SS#: [id number]  Candie Chroman, LCSW

## 2018-12-25 NOTE — Evaluation (Addendum)
Clinical/Bedside Swallow Evaluation Patient Details  Name: Norma Hill MRN: [id number] Date of Birth: [birth year]  Today's Date: 12/25/2018 Time: SLP Start Time (ACUTE ONLY): 0820 SLP Stop Time (ACUTE ONLY): 0900 SLP Time Calculation (min) (ACUTE ONLY): 40 min  Past Medical History:  Past Medical History:  Diagnosis Date  . A-fib ([place])   . Bowel trouble 2012   ocasionally  . Cancer ([place]) 10/2000   pt had 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 Pt then had a repeat excision on 11/16/2000  . Hypertension 2006  . Personal history of malignant neoplasm of breast 2002  . Solitary cyst of breast 2012  . Special screening for malignant neoplasms, colon    Past Surgical History:  Past Surgical History:  Procedure Laterality Date  . BREAST BIOPSY Right 2002  . BREAST SURGERY Right 2002   wide excision and sn x4 done 10/29/00 with repeat excision done on 11/16/2000 with post operative radiation treatment and 5 years of tamoxifen with completion in 2007  . CHOLECYSTECTOMY  2002  . COLONOSCOPY  2008  . DILATION AND CURETTAGE OF UTERUS  1990,s  . ELECTROPHYSIOLOGIC STUDY N/A 03/16/2016   Procedure: CARDIOVERSION;  Surgeon: Isaias Cowman, MD;  Location: [facility] ORS;  Service: Cardiovascular;  Laterality: N/A;   HPI:  Pt is a 73 y.o. female with a past medical history of Dementia, a-fib on elequis, HTN, CVA, CA. Pt has multiple recent admissions, most recent for UTI. CT from previous admission this month revealed subacute infarct in the R MCA and PCA. MRI on 10/20 revealed extension of previous infarction into inferior R parietal lobe, deep insula, and temporoparietal region.    Assessment / Plan / Recommendation Clinical Impression  Pt appears to present w/ adequate oropharyngeal phase swallow function w/ No overt clinical s/s of aspiration noted during po trials. Pt appears at reduced risk for aspiration when following general aspiration  precautions, per this study today. Pt consumed po trials w/ no oral phase deficits noted; timely A-P transfer w/ full oral clearing followed. No overt coughing noted during/post trials of liquids, foods. Clear vocal quality noted and no decline in respiratory status was apparent during/post po trials. OM exam grossly wfl; slight decreased tone in L Upper lip present - this did not impact the oral phase of swallowing, no anterior leakage noted during trials. Pt fed self w/ setup support. Recommend continue current diet as ordered w/ general aspiration precautions; Pills in Puree Whole if needed for easier swallowing. NSG to reconsult if any decline in status while admitted. As pt has a baseline of Dementia(per chart notes) and recent CVA, recommend formal assessment of Cognitive-linguistic skills to determine if any decline or concern for safety of ADLs is present.  SLP Visit Diagnosis: Dysphagia, unspecified (R13.10)    Aspiration Risk  (reduced)    Diet Recommendation  regular diet w/ thin liquids; general aspiration precautions  Medication Administration: Whole meds with puree(IF any difficulty swallowing w/ liquids)    Other  Recommendations Recommended Consults: (Dietician f/u) Oral Care Recommendations: Oral care BID;Patient independent with oral care Other Recommendations: (n/a)   Follow up Recommendations None      Frequency and Duration (n/a)  (n/a)       Prognosis Prognosis for Safe Diet Advancement: Good Barriers to Reach Goals: Cognitive deficits;Time post onset;Severity of deficits      Swallow Study   General Date of Onset: 12/24/18 HPI: Pt is a 73 y.o. female with a past medical  history of a-fib on elequis, HTN, CVA, CA. Pt has multiple recent admissions, most recent for UTI. CT from previous admission this month revealed subacute infarct in the R MCA and PCA. MRI on 10/20 revealed extension of previous infarction into inferior R parietal lobe, deep insula, and  temporoparietal region.  Type of Study: Bedside Swallow Evaluation Previous Swallow Assessment: none Diet Prior to this Study: Regular;Thin liquids Temperature Spikes Noted: No(wbc 6.0) Respiratory Status: Room air History of Recent Intubation: No Behavior/Cognition: Alert;Cooperative;Pleasant mood;Distractible;Requires cueing(min; baseline of Dementia) Oral Cavity Assessment: Within Functional Limits Oral Care Completed by SLP: Recent completion by staff Oral Cavity - Dentition: Adequate natural dentition Vision: Functional for self-feeding Self-Feeding Abilities: Able to feed self;Needs set up(min) Patient Positioning: Upright in chair Baseline Vocal Quality: Normal Volitional Cough: Strong Volitional Swallow: Able to elicit    Oral/Motor/Sensory Function Overall Oral Motor/Sensory Function: Within functional limits(slight decreased tone of L upper lip)   Ice Chips Ice chips: Within functional limits Presentation: Spoon(fed; 2 trials)   Thin Liquid Thin Liquid: Within functional limits Presentation: Cup;Self Fed;Straw(5 trials via each)    Nectar Thick Nectar Thick Liquid: Not tested   Honey Thick Honey Thick Liquid: Not tested   Puree Puree: Within functional limits Presentation: Self Fed;Spoon(5 trials)   Solid     Solid: Within functional limits Presentation: Self Fed;Spoon(3 trials)       Orinda Kenner, MS, CCC-SLP Rohaan Durnil 12/25/2018,3:03 PM

## 2018-12-25 NOTE — Progress Notes (Signed)
Per Dr. Posey Pronto, do not exchange foley at this time. Madlyn Frankel, RN

## 2018-12-25 NOTE — Evaluation (Addendum)
Physical Therapy Evaluation Patient Details Name: Norma Hill MRN: [id number] DOB: 1945/10/16 Today's Date: 12/25/2018   History of Present Illness  73 y.o. female with a past medical history of a-fib on elequis, HTN, CVA, CA. Pt has multiple recent admissions, most recent for UTI. CT from previous admissions revealed subacute infarct in the R MCA and PCA. MRI on 10/20 revealed extension of previous infarction into inferior R parietal lobe, deep insula, and temporoparietal region.     Clinical Impression  Pt is a pleasant 73 year old F who was admitted for UTI. Upon entry, pt is in bed finishing up breakfast. She reports that she is feeling "okay"; no pain but feels a bit weak. Pt performs bed mobility, transfers, and ambulation with min assist/min guard and use of RW. Pt requires significant verbal cueing for almost all tasks and tactile cueing/assist during amb for turns to avoid running into objects. Pt demonstrates deficits with strength, cognition, mobility, and balance. She appears to be functioning at a similar level to her last admission. Pt will benefit from skilled PT to address above deficits; current follow-up care recommendation is SNF.   This entire session was guided, instructed, and directly supervised by Greggory Stallion, DPT.     Follow Up Recommendations SNF    Equipment Recommendations  None recommended by PT    Recommendations for Other Services       Precautions / Restrictions Precautions Precautions: Fall Restrictions Weight Bearing Restrictions: No      Mobility  Bed Mobility Overal bed mobility: Needs Assistance Bed Mobility: Supine to Sit     Supine to sit: Mod assist     General bed mobility comments: Pt requires significant verbal cues to sequence bringing LE's to EOB and reaching toward bedrails to facilitate reaching EOB. Mod assist from PT to bring pt to EOB.   Transfers Overall transfer level: Needs assistance Equipment used: Rolling  walker (2 wheeled) Transfers: Sit to/from Stand Sit to Stand: Min guard         General transfer comment: Min guard provided due to recent fall. Safe hand placement for stand, cueing to replace L UE on handle of walker. Cueing to remain within frame of walker when sitting in recliner  Ambulation/Gait Ambulation/Gait assistance: Min assist Gait Distance (Feet): 25 Feet Assistive device: Rolling walker (2 wheeled) Gait Pattern/deviations: Step-through pattern;Narrow base of support;Decreased stride length     General Gait Details: Pt amb slowly using RW. No LOB's noted. Pt requires min assist for turns by guiding RW in order to avoid walking into objects including walls and the bed. HR remained stable during amb.   Stairs            Wheelchair Mobility    Modified Rankin (Stroke Patients Only)       Balance Overall balance assessment: Needs assistance Sitting-balance support: Feet supported Sitting balance-Leahy Scale: Fair     Standing balance support: Bilateral upper extremity supported Standing balance-Leahy Scale: Fair Standing balance comment: Pt stands with kyphotic posture, tends to have nexk in flexed position.                             Pertinent Vitals/Pain Pain Assessment: No/denies pain    Home Living Family/patient expects to be discharged to:: Skilled nursing facility Living Arrangements: Spouse/significant other Available Help at Discharge: Family;Available 24 hours/day Type of Home: House Home Access: Stairs to enter Entrance Stairs-Rails: None Entrance Stairs-Number of Steps: 1  Home Layout: One level Home Equipment: Walker - 2 wheels Additional Comments: Pt adm to SNF following recent hospitalization, is expected to return    Prior Function Level of Independence: Needs assistance   Gait / Transfers Assistance Needed: Pt using RW since last hospitalization. Able to perform transfers with min guard     Comments: Pt using RW  since prior admission to [facility].      Hand Dominance   Dominant Hand: Right    Extremity/Trunk Assessment   Upper Extremity Assessment Upper Extremity Assessment: Generalized weakness    Lower Extremity Assessment Lower Extremity Assessment: Generalized weakness    Cervical / Trunk Assessment Cervical / Trunk Assessment: Kyphotic  Communication   Communication: No difficulties  Cognition Arousal/Alertness: Awake/alert Behavior During Therapy: Flat affect Overall Cognitive Status: History of cognitive impairments - at baseline                   Orientation Level: Disoriented to;Time;Situation             General Comments: Pt pauses periodically during therex and requires cueing to continue. Significant vc throughout eval/tx for sequencing      General Comments General comments (skin integrity, edema, etc.): Pt's current condition is about the same as previous admission; Coordination of upper and lower extremeties in tact. Pt still appears confused and is not a good historian    Exercises Other Exercises Other Exercises: Supine, 10 reps, bilat: AP, QS, HS; PT supervision Other Exercises: Seated EOB, 10 repetition, bilateral: march; PT supervision   Assessment/Plan    PT Assessment Patient needs continued PT services  PT Problem List Decreased strength;Decreased activity tolerance;Decreased balance;Decreased mobility;Decreased coordination;Decreased cognition;Decreased safety awareness       PT Treatment Interventions DME instruction;Gait training;Stair training;Functional mobility training;Therapeutic activities;Therapeutic exercise;Balance training;Patient/family education    PT Goals (Current goals can be found in the Care Plan section)  Acute Rehab PT Goals Patient Stated Goal: to return home PT Goal Formulation: With patient Time For Goal Achievement: 01/08/19 Potential to Achieve Goals: Good    Frequency Min 2X/week   Barriers to discharge         Co-evaluation               AM-PAC PT "6 Clicks" Mobility  Outcome Measure Help needed turning from your back to your side while in a flat bed without using bedrails?: A Little Help needed moving from lying on your back to sitting on the side of a flat bed without using bedrails?: A Little Help needed moving to and from a bed to a chair (including a wheelchair)?: A Little Help needed standing up from a chair using your arms (e.g., wheelchair or bedside chair)?: A Little Help needed to walk in hospital room?: A Little Help needed climbing 3-5 steps with a railing? : A Little 6 Click Score: 18    End of Session Equipment Utilized During Treatment: Gait belt Activity Tolerance: Patient limited by lethargy Patient left: in chair;with call bell/phone within reach;with chair alarm set Nurse Communication: Mobility status PT Visit Diagnosis: Other symptoms and signs involving the nervous system (R29.898);Muscle weakness (generalized) (M62.81);Unsteadiness on feet (R26.81)    Time: [id number] PT Time Calculation (min) (ACUTE ONLY): 35 min   Charges:             Dixie Dials, SPT   Kason Benak 12/25/2018, 11:27 AM

## 2018-12-26 LAB — MAGNESIUM: Magnesium: 1.9 mg/dL (ref 1.7–2.4)

## 2018-12-26 LAB — BASIC METABOLIC PANEL
Anion gap: 6 (ref 5–15)
BUN: 14 mg/dL (ref 8–23)
CO2: 25 mmol/L (ref 22–32)
Calcium: 9.1 mg/dL (ref 8.9–10.3)
Chloride: 112 mmol/L — ABNORMAL HIGH (ref 98–111)
Creatinine, Ser: 0.55 mg/dL (ref 0.44–1.00)
GFR calc Af Amer: >60 mL/min (ref >60)
GFR calc non Af Amer: >60 mL/min (ref >60)
Glucose, Bld: 105 mg/dL — ABNORMAL HIGH (ref 70–99)
Potassium: 3.9 mmol/L (ref 3.5–5.1)
Sodium: 143 mmol/L (ref 135–145)

## 2018-12-26 MED ORDER — VITAMIN C 500 MG PO TABS
250.0000 mg | ORAL_TABLET | Freq: Two times a day (BID) | ORAL | Status: DC
  Administered 2018-12-26 – 2018-12-27 (×2): 250 mg via ORAL
  Filled 2018-12-26 (×2): qty 1

## 2018-12-26 MED ORDER — FLUCONAZOLE 100 MG PO TABS
200.0000 mg | ORAL_TABLET | Freq: Every day | ORAL | Status: DC
  Administered 2018-12-27: 13:00:00 200 mg via ORAL
  Filled 2018-12-26 (×2): qty 2

## 2018-12-26 NOTE — Progress Notes (Signed)
Physical Therapy Treatment Patient Details Name: Norma Hill MRN: [id number] DOB: January 09, 1946 Today's Date: 12/26/2018    History of Present Illness 73 y.o. female with a past medical history of a-fib on elequis, HTN, CVA, CA. Pt has multiple recent admissions, most recent for UTI. CT from previous admissions revealed subacute infarct in the R MCA and PCA. MRI on 10/20 revealed extension of previous infarction into inferior R parietal lobe, deep insula, and temporoparietal region.     PT Comments    Pt is progressing toward goals. Upon entry, pt is seated up in bed and has eaten more than usual for breakfast. Pt seems to be in good spirits and more cognitively aware. This date, pt performs bed mobility, transfers, and ambulation with supervision/min guard and use of RW. Pt tolerated increased difficulty of exercise program and increased amb distance compared to prior session. Pt will continue to benefit from skilled PT to address deficits in strength, activity tolerance, mobility, and cognition.     Follow Up Recommendations  SNF     Equipment Recommendations  None recommended by PT    Recommendations for Other Services       Precautions / Restrictions Precautions Precautions: Fall Restrictions Weight Bearing Restrictions: No    Mobility  Bed Mobility Overal bed mobility: Needs Assistance Bed Mobility: Supine to Sit     Supine to sit: Supervision     General bed mobility comments: Pt transfered to EOB without verbal cues and without assist required to maneuver LE's  Transfers Overall transfer level: Needs assistance Equipment used: Rolling walker (2 wheeled) Transfers: Sit to/from Stand Sit to Stand: Min guard         General transfer comment: Min guard provided. Able to stand with no LOB/unsteadiness. Safe hand placement.   Ambulation/Gait Ambulation/Gait assistance: Min guard Gait Distance (Feet): 90 Feet Assistive device: Rolling walker (2  wheeled) Gait Pattern/deviations: Step-through pattern;Narrow base of support;Decreased stride length     General Gait Details: Pt appears to amb at slightly increased speed today. She performs better with turns, minimal tactile cueing provided and is not running into objects. No LOB's noted. Cueing provided to reach upright posture when amb   Stairs             Wheelchair Mobility    Modified Rankin (Stroke Patients Only)       Balance Overall balance assessment: Needs assistance Sitting-balance support: Feet supported Sitting balance-Leahy Scale: Good     Standing balance support: Bilateral upper extremity supported Standing balance-Leahy Scale: Good Standing balance comment: Kyphotic posture still present                            Cognition Arousal/Alertness: Awake/alert Behavior During Therapy: WFL for tasks assessed/performed Overall Cognitive Status: History of cognitive impairments - at baseline Area of Impairment: Orientation;Memory;Safety/judgement                 Orientation Level: Disoriented to;Time             General Comments: Pt seems more cognitively aware today. Pt still unable to report time, but is aware of her LOS and reason for admission. Performed better with exercises      Exercises Other Exercises Other Exercises: Supine, 10 reps, bilat: AP, QS, SLR, GS, resisted hip ad/ab; PT supervision Other Exercises: Seated EOB, 10 repetition, bilateral: march; PT supervision    General Comments General comments (skin integrity, edema, etc.): Pt performed very well  during session today; tolerated increased exercises      Pertinent Vitals/Pain Pain Assessment: No/denies pain    Home Living                      Prior Function            PT Goals (current goals can now be found in the care plan section) Acute Rehab PT Goals Patient Stated Goal: to return home PT Goal Formulation: With patient Time For Goal  Achievement: 01/08/19 Potential to Achieve Goals: Good    Frequency    Min 2X/week      PT Plan Current plan remains appropriate    Co-evaluation              AM-PAC PT "6 Clicks" Mobility   Outcome Measure  Help needed turning from your back to your side while in a flat bed without using bedrails?: A Little Help needed moving from lying on your back to sitting on the side of a flat bed without using bedrails?: A Little Help needed moving to and from a bed to a chair (including a wheelchair)?: A Little Help needed standing up from a chair using your arms (e.g., wheelchair or bedside chair)?: A Little Help needed to walk in hospital room?: A Little Help needed climbing 3-5 steps with a railing? : A Lot 6 Click Score: 17    End of Session Equipment Utilized During Treatment: Gait belt Activity Tolerance: Patient tolerated treatment well Patient left: in chair;with call bell/phone within reach;with chair alarm set;with family/visitor present Nurse Communication: Mobility status PT Visit Diagnosis: Other symptoms and signs involving the nervous system (R29.898);Muscle weakness (generalized) (M62.81);Unsteadiness on feet (R26.81)     Time: [id number] PT Time Calculation (min) (ACUTE ONLY): 28 min  Charges:                        Norma Hill, SPT    Norma Hill 12/26/2018, 12:36 PM

## 2018-12-26 NOTE — Progress Notes (Signed)
Initial Nutrition Assessment  DOCUMENTATION CODES:   Not applicable  INTERVENTION:   Ensure Enlive po BID, each supplement provides 350 kcal and 20 grams of protein  Magic cup TID with meals, each supplement provides 290 kcal and 9 grams of protein  MVI daily   Vitamin C 250mg  po BID   Liberalize diet   NUTRITION DIAGNOSIS:   Inadequate oral intake related to acute illness as evidenced by meal completion < 50%.  GOAL:   Patient will meet greater than or equal to 90% of their needs  MONITOR:   PO intake, Supplement acceptance, Labs, Weight trends, Skin, I & O's  REASON FOR ASSESSMENT:   Malnutrition Screening Tool    ASSESSMENT:   73 y.o. 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 Pt recently admitted with HCAP and syncope episode. Pt was eating 50-100% of meals prior to her last discharge. Pt will drink Ensure. Per chart, pt ate 25% of her breakfast this morning and did drink some Ensure. RD will order vitamins and supplements to help pt meet her estimated needs and support wound healing. Pt seen by SLP on 10/21 who recommended a regular diet with thin liquids. RD will liberalize pt's diet as patient is not likely eating enough to exceed any nutrient limits.   Medications reviewed and include: aspirin, diflucan, MVI, miralax, senokot   Labs reviewed: K 3.9 wnl, Mg 1.9 wnl  Nutrition-Focused physical exam completed. Findings are no fat depletions, mild muscle depletions, and no edema.   Diet Order:   Diet Order            Diet Heart Room service appropriate? Yes with Assist; Fluid consistency: Thin  Diet effective now             EDUCATION NEEDS:   No education needs have been identified at this  time  Skin:  Skin Assessment: Reviewed RN Assessment(Ecchymosis, Stage II vertebral column)  Last BM:  PTA  Height:   Ht Readings from Last 1 Encounters:  12/24/18 5\' 3"  (1.6 m)    Weight:   Wt Readings from Last 1 Encounters:  12/24/18 68 kg    Ideal Body Weight:  52.3 kg  BMI:  Body mass index is 26.57 kg/m.  Estimated Nutritional Needs:   Kcal:  1400-1600kcal/day  Protein:  70-80g/day  Fluid:  >1.4L/day  Koleen Distance MS, RD, LDN Pager #- [phone] Office#- [phone] After Hours Pager: [phone]

## 2018-12-26 NOTE — Progress Notes (Signed)
Pt UOP after in/out cath was 878ml.  Post bladder scan was 25ml

## 2018-12-26 NOTE — Progress Notes (Addendum)
Pt had an unmeasured occurrence when voiding.  Per nursing student, the bladder scan post void revealed 623ml in bladder. MD notified.  In and out urinary cath order for any amount >550 or pt discomfort.

## 2018-12-26 NOTE — TOC Progression Note (Signed)
Transition of Care [facility]) - Progression Note    Patient Details  Name: Norma Hill MRN: [id number] Date of Birth: 13-Apr-1945  Transition of Care [facility]) CM/SW [place], LCSW Phone Number: 12/26/2018, 2:51 PM  Clinical Narrative:  Per MD, patient will likely return to [facility] SNF tomorrow. Admissions coordinator is aware. CSW faxed clinicals to [facility] for insurance authorization review. Patient will not need a new COVID test if she discharges tomorrow.   Expected Discharge Plan: [place]    Expected Discharge Plan and Services Expected Discharge Plan: [place] Choice: [place] arrangements for the past 2 months: Single Family Home, [place]                                       Social Determinants of Health (SDOH) Interventions    Readmission Risk Interventions No flowsheet data found.

## 2018-12-26 NOTE — Progress Notes (Signed)
[place] [facility] [place] NAME: Norma Hill    MR#:  [id number]  DATE OF BIRTH:  17-Feb-1946  SUBJECTIVE:  patient came in with increasing confusion and weakness with not able to ambulate like before. Poor appetite.  Husband at bedside.  REVIEW OF SYSTEMS:   Review of Systems  Unable to perform ROS: Mental status change   Tolerating Diet:Yes Tolerating PT: Yes  DRUG ALLERGIES:  No Known Allergies  VITALS:  Blood pressure 121/77, pulse 68, temperature 98.2 F (36.8 C), temperature source Oral, resp. rate 17, height 5\' 3"  (1.6 m), weight 68 kg, SpO2 95 %.  PHYSICAL EXAMINATION:   Physical Exam  GENERAL:  73 y.o.-year-old patient lying in the bed with no acute distress.  EYES: Pupils equal, round, reactive to light and accommodation. No scleral icterus. Extraocular muscles intact.  HEENT: Head atraumatic, normocephalic. Oropharynx and nasopharynx clear.  NECK:  Supple, no jugular venous distention. No thyroid enlargement, no tenderness.  LUNGS: Normal breath sounds bilaterally, no wheezing, rales, rhonchi. No use of accessory muscles of respiration.  CARDIOVASCULAR: S1, S2 normal. No murmurs, rubs, or gallops.  ABDOMEN: Soft, nontender, nondistended. Bowel sounds present. No organomegaly or mass. Chronic Foley+ EXTREMITIES: No cyanosis, clubbing or edema b/l.    NEUROLOGIC: Cranial nerves II through XII are intact. No focal Motor or sensory deficits b/l. Mild left upper and lower extremity weakness PSYCHIATRIC:  patient is alert and oriented x 2 SKIN: No obvious rash, lesion, or ulcer.   LABORATORY PANEL:  CBC Recent Labs  Lab 12/25/18 0450  WBC 6.0  HGB 11.4*  HCT 36.3  PLT 189    Chemistries  Recent Labs  Lab 12/24/18 1227  12/26/18 0351  NA 142   < > 143  K 3.5   < > 3.9  CL 104   < > 112*  CO2 29   < > 25  GLUCOSE 110*   < > 105*  BUN 21   < > 14  CREATININE 0.78   < > 0.55  CALCIUM 9.7   < > 9.1  MG  --    --  1.9  AST 54*  --   --   ALT 56*  --   --   ALKPHOS 86  --   --   BILITOT 1.0  --   --    < > = values in this interval not displayed.   Cardiac Enzymes No results for input(s): TROPONINI in the last 168 hours. RADIOLOGY:  Dg Chest 1 View  Result Date: 12/24/2018 CLINICAL DATA:  Weakness. EXAM: CHEST  1 VIEW COMPARISON:  December 14, 2018. FINDINGS: Stable cardiomegaly. No pneumothorax or pleural effusion is noted. Both lungs are clear. The visualized skeletal structures are unremarkable. IMPRESSION: No active disease. Electronically Signed   By: Marijo Conception M.D.   On: 12/24/2018 13:07   Ct Head Wo Contrast  Result Date: 12/24/2018 CLINICAL DATA:  Generalized weakness and confusion.Altered level of consciousness (LOC), unexplained EXAM: CT HEAD WITHOUT CONTRAST TECHNIQUE: Contiguous axial images were obtained from the base of the skull through the vertex without intravenous contrast. COMPARISON:  Head CT 12/08/2018 FINDINGS: Brain: RIGHT MCA territory infarction again demonstrated and not changed from recent CT. No new cortical infarction. No intracranial hemorrhage. Ventricular hemorrhage. No midline shift or mass effect. There are periventricular and subcortical white matter hypodensities. Generalized cortical atrophy. Vascular: No hyperdense vessel or unexpected calcification. Skull: Normal. Negative for fracture or focal  lesion. Sinuses/Orbits: Paranasal sinuses and mastoid air cells are clear. Orbits are clear. Other: None. IMPRESSION: 1. No change in RIGHT MCA territory infarction demonstrated on recent CT. 2. No acute intracranial findings. 3. Atrophy and white matter microvascular disease. Electronically Signed   By: Suzy Bouchard M.D.   On: 12/24/2018 13:00   Mr Brain Wo Contrast  Result Date: 12/24/2018 CLINICAL DATA:  Acute presentation with worsening weakness over the last 3 days. Atrial fibrillation. Previous right MCA territory infarction. EXAM: MRI HEAD WITHOUT  CONTRAST TECHNIQUE: Multiplanar, multiecho pulse sequences of the brain and surrounding structures were obtained without intravenous contrast. COMPARISON:  CT same day and 12/08/2018.  MRI 12/04/2018. FINDINGS: Brain: There has been extension of infarction in the right middle cerebral artery territory since the initial acute infarction of September 30th. Late subacute infarction is seen in the right parietal region and parietooccipital junction region, having undergone expected evolutionary changes less restricted diffusion. There is new brain infarction contiguous to that region along the inferior margin in the more inferior right parietal brain and in the deep insula and temporoparietal junction region. There is no evidence of significant mass effect or hemorrhage. Elsewhere, chronic small-vessel ischemic changes throughout the white matter appear similar. No evidence of midline shift, hydrocephalus or extra-axial collection. Vascular: Major vessels at the base of the brain show flow. Skull and upper cervical spine: Negative Sinuses/Orbits: Clear/normal Other: None IMPRESSION: Subacute infarction as previously seen in the right parietal cortical and subcortical brain. Interval extension of the infarction inferior to the previously seen stroke in the more inferior right parietal lobe, deep insula and temporoparietal junction region. Evidence of hemorrhage, significant mass effect or any shift. The age of the extension is difficult to date precisely, but this additional brain involvement was definitely not present on 12/04/2018. Chronic small-vessel ischemic changes elsewhere throughout the brain as seen previously. Electronically Signed   By: Nelson Chimes M.D.   On: 12/24/2018 15:55   ASSESSMENT AND PLAN:  Norma Hill  is a 73 y.o. Caucasian female with a known history of multiple medical problems that will be mentioned below including atrial fibrillation and right breast cancer, presented to emergency room  with an onset of generalized weakness since Saturday  1.  Generalized weakness? altered mental status suspected due to UTI and progression of her previous stroke as noted on new MRI.   -patient's was started on IV Rocephin. Now stopped. Started on Diflucan -Patient is chronic Foley catheter for several weeks.She will need urology appointment has not been able to see urology as outpatient  2.  Abnormal MRI with suspected extension of her old cerebral infarction.  It is unclear when this occurred.   -seen by Dr. Doy Mince. No further workup needed. Continue aspirin and eliquis as before. -Physical therapy to see patient -patient overall is very weak. She has significant confusion and poor PO intake.  3.  CVA with residual left-sided hemiparesis. -  We will continue aspirin and Eliquis.  4.  Atrial fibrillation.  She has controlled ventricular response.  We will continue Eliquis and Toprol-XL  5.  Dyslipidemia.  Continue Crestor.  6.  DVT prophylaxis.  Continue Eliquis  Case manager for discharge planning. Patient is from [facility] facility  Case discussed with Care Management/Social Worker. Management plans discussed with the patient, family and they are in agreement.  CODE STATUS: DNR  DVT Prophylaxis: eliquis  TOTAL TIME TAKING CARE OF THIS PATIENT: 30 minutes.   POSSIBLE D/C IN 1-2 DAYS, DEPENDING ON CLINICAL  CONDITION.  Note: This dictation was prepared with Dragon dictation along with smaller phrase technology. Any transcriptional errors that result from this process are unintentional.  Neita Carp M.D on 12/26/2018 at 12:18 PM  Between 7am to 6pm - Pager - [phone]  After 6pm go to [email or web address] - password [email or web address] [facility]  Office  [phone]  CC: Primary care physician; Guadalupe Maple, MDPatient ID: Norma Hill, female   DOB: May 08, 1945, 73 y.o.   MRN: [id number]

## 2018-12-27 MED ORDER — FLUCONAZOLE 200 MG PO TABS: 200.0000 mg | ORAL_TABLET | Freq: Every day | ORAL | 0 refills | Status: DC

## 2018-12-27 NOTE — Progress Notes (Signed)
PT Cancellation Note  Patient Details Name: Norma Hill MRN: [id number] DOB: 01-06-1946   Cancelled Treatment:    Reason Eval/Treat Not Completed: Other (comment) Pt has just begun eating; PT will re-attempt if able this afternoon.    Dixie Dials, SPT   Ailie Gage 12/27/2018, 11:55 AM

## 2018-12-27 NOTE — Care Management Important Message (Signed)
Important Message  Patient Details  Name: Norma Hill MRN: [id number] Date of Birth: 1945/10/18   Medicare Important Message Given:  Yes     Juliann Pulse A Azaan Leask 12/27/2018, 10:59 AM

## 2018-12-27 NOTE — Progress Notes (Signed)
Advance care planning  Purpose of Encounter CVA, UTI  Parties in Attendance Patient and HCPOA Husband at bedsdie  Patients Decisional capacity Patient with confusion and unable to make medical decisions  Discussed in detail regarding CVA and UTI.  Treatment plan , prognosis discussed.  All questions answered.  Discussed with husband regarding patient's worsening mental status and loss of appetite.  Explained that without nutrition patient will get weaker and would not be able to participate in physical therapy.  Husband has been trying to feed her himself when available.  At this time he request that we continue physical therapy but wants to be realistic and transition away from rehab if she continues to worsen.  Code status - DNR /DNI  Time spent - 17 minutes

## 2018-12-27 NOTE — TOC Transition Note (Signed)
Transition of Care [facility]) - CM/SW Discharge Note   Patient Details  Name: Emeli Steckel MRN: [id number] Date of Birth: 1946-01-11  Transition of Care [facility]) CM/SW Contact:  Shelbie Hutching, RN Phone Number: 12/27/2018, 2:51 PM   Clinical Narrative:     Authorization obtained from [place] number [id number], approved for 5 days, next review 10/26.  [employer] Case worker is Herbert Pun all clinical information for next review should be faxed to [phone].  Herbert Pun will contact [employer] within the next 72 hours with the authorization number.   Patient will discharge today to [facility].  Patient will transport via EMS.  Bedside RN will call report to [phone].     Final next level of care: Skilled Nursing Facility Barriers to Discharge: Barriers Resolved   Patient Goals and CMS Choice Patient states their goals for this hospitalization and ongoing recovery are:: Patient not fully oriented. CMS Medicare.gov Compare Post Acute Care list provided to:: Patient Choice offered to / list presented to : Patient  Discharge Placement              Patient chooses bed at: [facility] Patient to be transferred to facility by:  EMS Name of family member notified: Amaryllis Dyke - Husband Patient and family notified of of transfer: 12/27/18  Discharge Plan and Services     Post Acute Care Choice: [place]                               Social Determinants of Health (SDOH) Interventions     Readmission Risk Interventions No flowsheet data found.

## 2018-12-27 NOTE — Discharge Summary (Addendum)
[facility] NAME: Norma Hill    MR#:  [id number]  DATE OF BIRTH:  1945/12/17  DATE OF ADMISSION:  12/24/2018 ADMITTING PHYSICIAN: Christel Mormon, MD  DATE OF DISCHARGE: 12/27/2018  PRIMARY CARE PHYSICIAN: Guadalupe Maple, MD   ADMISSION DIAGNOSIS:  Weakness [R53.1]  DISCHARGE DIAGNOSIS:  Active Problems:   UTI (urinary tract infection)   Pressure injury of skin   Acute encephalopathy  SECONDARY DIAGNOSIS:   Past Medical History:  Diagnosis Date  . A-fib ([place])   . Bowel trouble 2012   ocasionally  . 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 Pt then had a repeat excision on 11/16/2000  . Hypertension 2006  . Personal history of malignant neoplasm of breast 2002  . Solitary cyst of breast 2012  . Special screening for malignant neoplasms, colon      ADMITTING HISTORY  HISTORY OF PRESENT ILLNESS:  Norma Hill  is a 73 y.o. Caucasian female with a known history of multiple medical problems that will be mentioned below including atrial fibrillation and right breast cancer, presented to emergency room with an onset of generalized weakness since Saturday.  She has not been ambulating over the last several days and she is ambulatory at baseline.  She had a recent CVA with left-sided hemiparesis which has improved.  She is confused at baseline.  No reported fever or chills.  She denies any nausea or vomiting or abdominal pain.  She admitted to urinary frequency and urgency without dysuria or hematuria or flank pain.  No headache or dizziness or blurred vision.  No dysphagia or dysarthria.  Upon presentation to the emergency room, vital signs were within normal.  Labs revealed borderline potassium of 3.5.  AST and ALT are slightly elevated and troponin I was 13.  UA was strongly positive for UTI and showed budding yeast and hyaline casts and urine blood culture  was sent.  Noncontrasted head CT scan revealed no change in the right MCA territory infarction demonstrated on recent CT and no acute intracranial normalities.  It showed atrophy and white matter microvascular disease.  Atrial fibrillation with controlled response of 84.  Contrast revealed subacute infarction as previously seen in the right parietal cortical and subcortical brain however with interval extension of the infarction inferior to the previously seen stroke in the more inferior right parietal lobe, deep insula and temporoparietal junction region.  There was no evidence for hemorrhage significant mass affect or shift.  It is difficult to date the age of the extension precisely.  It showed chronic small vessel ischemic changes throughout the brain.  EKG showed atrial fibrillation with controlled ventricular sponsor of 84.  The patient was given a gram of IV Rocephin and her 50 mg p.o. Diflucan.  She will be admitted to an observation medical monitor bed for further evaluation and management.   HOSPITAL COURSE:   LindaCooperis a73 y.o.Caucasian femalewith a known history of multiple medical problems that will be mentioned below including atrial fibrillation and right breast cancer, presented to emergency room with an onset of generalized weakness since Saturday  1. Generalized weakness? altered mental status suspected due to UTI and progression of her previous stroke as noted on new MRI.  -patient's was started on IV Rocephin. Now stopped. Started on Diflucan.  Prescription given for tablets after discharge -Patient had chronic Foley catheter for several weeks..  2. Abnormal  MRI with suspected extension of her old cerebral infarction. It is unclear when this occurred.  -seen by Dr. Doy Mince. No further workup needed. Continue aspirin and eliquis as before. -Physical therapy to see patient -patient overall is very weak. She has significant confusion and poor PO intake.  3.  CVA with residual left-sided hemiparesis. - We will continue aspirin and Eliquis.  4. Atrial fibrillation. She has controlled ventricular response. We will continue Eliquis , Toprol-XL/cardizem  5. Dyslipidemia. Continue Crestor.  6.  Urinary retention.  Patient had urinary retention during last admission.  Had Foley catheter placed and was supposed to follow-up with urology as outpatient.  Patient with her confusion pulled her Foley out in the hospital.  Voiding trial with forced vital bladder scan showing only 40 to 50 mL.  No further need for Foley catheter.  If any further issues will need Foley placed and urology follow-up.  Discussed with husband in detail regarding patient's poor appetite, worsening weakness and mental status.  Long-term prognosis is very poor.  Patient continues to work with physical therapy and will hopefully improve.  Discharge to skilled nursing facility.  CONSULTS OBTAINED:  Treatment Team:  Catarina Hartshorn, MD Alexis Goodell, MD  DRUG ALLERGIES:  No Known Allergies  DISCHARGE MEDICATIONS:   Allergies as of 12/27/2018   No Known Allergies     Medication List    STOP taking these medications   senna-docusate 8.6-50 MG tablet Commonly known as: Senokot-S     TAKE these medications   acetaminophen 325 MG tablet Commonly known as: TYLENOL Take 2 tablets (650 mg total) by mouth every 6 (six) hours as needed for mild pain (or Fever >/= 101).   apixaban 5 MG Tabs tablet Commonly known as: ELIQUIS Take 1 tablet (5 mg total) by mouth 2 (two) times daily.   aspirin 81 MG EC tablet Take 1 tablet (81 mg total) by mouth daily.   diltiazem 180 MG 24 hr capsule Commonly known as: CARDIZEM CD Take 1 capsule (180 mg total) by mouth 2 (two) times daily.   diphenhydramine-acetaminophen 25-500 MG Tabs tablet Commonly known as: TYLENOL PM Take 2 tablets by mouth at bedtime as needed (pain/sleep).   feeding supplement (ENSURE ENLIVE)  Liqd Take 237 mLs by mouth 2 (two) times daily between meals.   fluconazole 200 MG tablet Commonly known as: DIFLUCAN Take 1 tablet (200 mg total) by mouth daily.   metoprolol succinate 25 MG 24 hr tablet Commonly known as: TOPROL-XL Take 1 tablet (25 mg total) by mouth daily.   multivitamin with minerals Tabs tablet Take 1 tablet by mouth daily.   polyethylene glycol 17 g packet Commonly known as: MIRALAX / GLYCOLAX Take 17 g by mouth 2 (two) times daily.   rosuvastatin 10 MG tablet Commonly known as: CRESTOR Take 1 tablet (10 mg total) by mouth daily.   senna 8.6 MG Tabs tablet Commonly known as: SENOKOT Take 2 tablets by mouth 2 (two) times daily.   tamsulosin 0.4 MG Caps capsule Commonly known as: FLOMAX Take 1 capsule (0.4 mg total) by mouth daily.       Today   VITAL SIGNS:  Blood pressure (!) 143/72, pulse 63, temperature 97.7 F (36.5 C), temperature source Oral, resp. rate 17, height 5\' 3"  (1.6 m), weight 68 kg, SpO2 94 %.  I/O:    Intake/Output Summary (Last 24 hours) at 12/27/2018 1157 Last data filed at 12/27/2018 0800 Gross per 24 hour  Intake 600 ml  Output 800 ml  Net -200 ml    PHYSICAL EXAMINATION:  Physical Exam  GENERAL:  73 y.o.-year-old patient lying in the bed with no acute distress.  LUNGS: Normal breath sounds bilaterally, no wheezing, rales,rhonchi or crepitation. No use of accessory muscles of respiration.  CARDIOVASCULAR: S1, S2 normal. No murmurs, rubs, or gallops.  ABDOMEN: Soft, non-tender, non-distended. Bowel sounds present. No organomegaly or mass.  NEUROLOGIC: Moves all 4 extremities. PSYCHIATRIC: The patient is alert and oriented x 2 SKIN: No obvious rash, lesion, or ulcer.   DATA REVIEW:   CBC Recent Labs  Lab 12/25/18 0450  WBC 6.0  HGB 11.4*  HCT 36.3  PLT 189    Chemistries  Recent Labs  Lab 12/24/18 1227  12/26/18 0351  NA 142   < > 143  K 3.5   < > 3.9  CL 104   < > 112*  CO2 29   < > 25   GLUCOSE 110*   < > 105*  BUN 21   < > 14  CREATININE 0.78   < > 0.55  CALCIUM 9.7   < > 9.1  MG  --   --  1.9  AST 54*  --   --   ALT 56*  --   --   ALKPHOS 86  --   --   BILITOT 1.0  --   --    < > = values in this interval not displayed.    Cardiac Enzymes No results for input(s): TROPONINI in the last 168 hours.  Microbiology Results  Results for orders placed or performed during the hospital encounter of 12/24/18  Urine culture     Status: Abnormal   Collection Time: 12/24/18 12:24 PM   Specimen: Urine, Random  Result Value Ref Range Status   Specimen Description   Final    URINE, RANDOM Performed at [facility], [address], [place], [place] [address]    Special Requests   Final    NONE Performed at [facility], [address] [place], [place] [address]    Culture >=100,000 COLONIES/mL YEAST (A)  Final   Report Status 12/25/2018 FINAL  Final  SARS CORONAVIRUS 2 (TAT 6-24 HRS) Nasopharyngeal Nasopharyngeal Swab     Status: None   Collection Time: 12/24/18  4:41 PM   Specimen: Nasopharyngeal Swab  Result Value Ref Range Status   SARS Coronavirus 2 NEGATIVE NEGATIVE Final    Comment: (NOTE) SARS-CoV-2 target nucleic acids are NOT DETECTED. The SARS-CoV-2 RNA is generally detectable in upper and lower respiratory specimens during the acute phase of infection. Negative results do not preclude SARS-CoV-2 infection, do not rule out co-infections with other pathogens, and should not be used as the sole basis for treatment or other patient management decisions. Negative results must be combined with clinical observations, patient history, and epidemiological information. The expected result is Negative. Fact Sheet for Patients: SugarRoll.be Fact Sheet for Healthcare Providers: https://www.woods-mathews.com/ This test is not yet approved or cleared by the [place] FDA and  has been authorized for  detection and/or diagnosis of SARS-CoV-2 by FDA under an Emergency Use Authorization (EUA). This EUA will remain  in effect (meaning this test can be used) for the duration of the COVID-19 declaration under Section 56 4(b)(1) of the Act, 21 U.S.C. section 360bbb-3(b)(1), unless the authorization is terminated or revoked sooner. Performed at [facility], [place] [address], [place], [place] [address]     RADIOLOGY:  No results found.  Follow  up with PCP in 1 week.  Management plans discussed with the patient, family and they are in agreement.  CODE STATUS:     Code Status Orders  (From admission, onward)         Start     Ordered   12/25/18 1246  Do not attempt resuscitation (DNR)  Continuous    Question Answer Comment  In the event of cardiac or respiratory ARREST Do not call a "code blue"   In the event of cardiac or respiratory ARREST Do not perform Intubation, CPR, defibrillation or ACLS   In the event of cardiac or respiratory ARREST Use medication by any route, position, wound care, and other measures to relive pain and suffering. May use oxygen, suction and manual treatment of airway obstruction as needed for comfort.   Comments d/w husband williiam Boardley in the room      12/25/18 1246        Code Status History    Date Active Date Inactive Code Status Order ID Comments User Context   12/24/2018 1625 12/25/2018 1246 Full Code [id number]  Sidney Ace Arvella Merles, MD ED   12/11/2018 2336 12/20/2018 1704 Full Code [id number]  Mansy, Arvella Merles, MD ED   12/04/2018 1445 12/09/2018 2154 Full Code [id number]  Bettey Costa, MD Inpatient   09/30/2018 0017 10/01/2018 1932 Full Code [id number]  Harrie Foreman, MD Inpatient   05/12/2017 2014 05/16/2017 1749 Full Code [id number]  Dustin Flock, MD Inpatient   02/10/2016 0052 02/11/2016 1603 Full Code [id number]  Hugelmeyer, Ubaldo Glassing, DO Inpatient   [facility] Care Planning Activity      TOTAL TIME TAKING CARE OF THIS PATIENT ON DAY OF DISCHARGE: more  than 30 minutes.   Leia Alf Jaycob Mcclenton M.D on 12/27/2018 at 11:57 AM  Between 7am to 6pm - Pager - [phone]  After 6pm go to [email or web address] - password [email or web address] [facility]  Office  [phone]  CC: Primary care physician; Guadalupe Maple, MD  Note: This dictation was prepared with Dragon dictation along with smaller phrase technology. Any transcriptional errors that result from this process are unintentional.

## 2018-12-31 ENCOUNTER — Ambulatory Visit: Payer: Medicare Other | Admitting: Urology

## 2019-01-01 ENCOUNTER — Ambulatory Visit: Payer: Medicare Other | Admitting: Urology

## 2019-03-06 ENCOUNTER — Telehealth: Payer: Self-pay | Admitting: Family Medicine

## 2019-03-06 NOTE — Telephone Encounter (Signed)
Okay to provider verbal orders to [facility].  Please call and provide.

## 2019-03-06 NOTE — Telephone Encounter (Signed)
Verbal orders given  

## 2019-03-06 NOTE — Telephone Encounter (Signed)
Colletta Maryland with [facility] home health calling for verbal OT orders  1 wk 6 In addition, requesting: 1. ST consult   2. Home health aid [phone]

## 2019-03-09 ENCOUNTER — Encounter: Payer: Self-pay | Admitting: Nurse Practitioner

## 2019-03-10 ENCOUNTER — Other Ambulatory Visit: Payer: Self-pay

## 2019-03-10 ENCOUNTER — Ambulatory Visit (INDEPENDENT_AMBULATORY_CARE_PROVIDER_SITE_OTHER): Payer: Medicare PPO | Admitting: Nurse Practitioner

## 2019-03-10 ENCOUNTER — Encounter: Payer: Self-pay | Admitting: Nurse Practitioner

## 2019-03-10 VITALS — BP 132/76 | HR 82 | Temp 98.4°F

## 2019-03-10 DIAGNOSIS — I639 Cerebral infarction, unspecified: Secondary | ICD-10-CM

## 2019-03-10 DIAGNOSIS — I5022 Chronic systolic (congestive) heart failure: Secondary | ICD-10-CM

## 2019-03-10 DIAGNOSIS — F0150 Vascular dementia without behavioral disturbance: Secondary | ICD-10-CM

## 2019-03-10 DIAGNOSIS — E7800 Pure hypercholesterolemia, unspecified: Secondary | ICD-10-CM | POA: Insufficient documentation

## 2019-03-10 DIAGNOSIS — I10 Essential (primary) hypertension: Secondary | ICD-10-CM | POA: Diagnosis not present

## 2019-03-10 DIAGNOSIS — I5020 Unspecified systolic (congestive) heart failure: Secondary | ICD-10-CM | POA: Insufficient documentation

## 2019-03-10 DIAGNOSIS — I4891 Unspecified atrial fibrillation: Secondary | ICD-10-CM | POA: Diagnosis not present

## 2019-03-10 NOTE — Patient Instructions (Signed)
DASH Eating Plan DASH stands for "Dietary Approaches to Stop Hypertension." The DASH eating plan is a healthy eating plan that has been shown to reduce high blood pressure (hypertension). It may also reduce your risk for type 2 diabetes, heart disease, and stroke. The DASH eating plan may also help with weight loss. What are tips for following this plan?  General guidelines  Avoid eating more than 2,300 mg (milligrams) of salt (sodium) a day. If you have hypertension, you may need to reduce your sodium intake to 1,500 mg a day.  Limit alcohol intake to no more than 1 drink a day for nonpregnant women and 2 drinks a day for men. One drink equals 12 oz of beer, 5 oz of wine, or 1 oz of hard liquor.  Work with your health care provider to maintain a healthy body weight or to lose weight. Ask what an ideal weight is for you.  Get at least 30 minutes of exercise that causes your heart to beat faster (aerobic exercise) most days of the week. Activities may include walking, swimming, or biking.  Work with your health care provider or diet and nutrition specialist (dietitian) to adjust your eating plan to your individual calorie needs. Reading food labels   Check food labels for the amount of sodium per serving. Choose foods with less than 5 percent of the Daily Value of sodium. Generally, foods with less than 300 mg of sodium per serving fit into this eating plan.  To find whole grains, look for the word "whole" as the first word in the ingredient list. Shopping  Buy products labeled as "low-sodium" or "no salt added."  Buy fresh foods. Avoid canned foods and premade or frozen meals. Cooking  Avoid adding salt when cooking. Use salt-free seasonings or herbs instead of table salt or sea salt. Check with your health care provider or pharmacist before using salt substitutes.  Do not fry foods. Cook foods using healthy methods such as baking, boiling, grilling, and broiling instead.  Cook with  heart-healthy oils, such as olive, canola, soybean, or sunflower oil. Meal planning  Eat a balanced diet that includes: ? 5 or more servings of fruits and vegetables each day. At each meal, try to fill half of your plate with fruits and vegetables. ? Up to 6-8 servings of whole grains each day. ? Less than 6 oz of lean meat, poultry, or fish each day. A 3-oz serving of meat is about the same size as a deck of cards. One egg equals 1 oz. ? 2 servings of low-fat dairy each day. ? A serving of nuts, seeds, or beans 5 times each week. ? Heart-healthy fats. Healthy fats called Omega-3 fatty acids are found in foods such as flaxseeds and coldwater fish, like sardines, salmon, and mackerel.  Limit how much you eat of the following: ? Canned or prepackaged foods. ? Food that is high in trans fat, such as fried foods. ? Food that is high in saturated fat, such as fatty meat. ? Sweets, desserts, sugary drinks, and other foods with added sugar. ? Full-fat dairy products.  Do not salt foods before eating.  Try to eat at least 2 vegetarian meals each week.  Eat more home-cooked food and less restaurant, buffet, and fast food.  When eating at a restaurant, ask that your food be prepared with less salt or no salt, if possible. What foods are recommended? The items listed may not be a complete list. Talk with your dietitian about   what dietary choices are best for you. Grains Whole-grain or whole-wheat bread. Whole-grain or whole-wheat pasta. Brown rice. Oatmeal. Quinoa. Bulgur. Whole-grain and low-sodium cereals. Pita bread. Low-fat, low-sodium crackers. Whole-wheat flour tortillas. Vegetables Fresh or frozen vegetables (raw, steamed, roasted, or grilled). Low-sodium or reduced-sodium tomato and vegetable juice. Low-sodium or reduced-sodium tomato sauce and tomato paste. Low-sodium or reduced-sodium canned vegetables. Fruits All fresh, dried, or frozen fruit. Canned fruit in natural juice (without  added sugar). Meat and other protein foods Skinless chicken or turkey. Ground chicken or turkey. Pork with fat trimmed off. Fish and seafood. Egg whites. Dried beans, peas, or lentils. Unsalted nuts, nut butters, and seeds. Unsalted canned beans. Lean cuts of beef with fat trimmed off. Low-sodium, lean deli meat. Dairy Low-fat (1%) or fat-free (skim) milk. Fat-free, low-fat, or reduced-fat cheeses. Nonfat, low-sodium ricotta or cottage cheese. Low-fat or nonfat yogurt. Low-fat, low-sodium cheese. Fats and oils Soft margarine without trans fats. Vegetable oil. Low-fat, reduced-fat, or light mayonnaise and salad dressings (reduced-sodium). Canola, safflower, olive, soybean, and sunflower oils. Avocado. Seasoning and other foods Herbs. Spices. Seasoning mixes without salt. Unsalted popcorn and pretzels. Fat-free sweets. What foods are not recommended? The items listed may not be a complete list. Talk with your dietitian about what dietary choices are best for you. Grains Baked goods made with fat, such as croissants, muffins, or some breads. Dry pasta or rice meal packs. Vegetables Creamed or fried vegetables. Vegetables in a cheese sauce. Regular canned vegetables (not low-sodium or reduced-sodium). Regular canned tomato sauce and paste (not low-sodium or reduced-sodium). Regular tomato and vegetable juice (not low-sodium or reduced-sodium). Pickles. Olives. Fruits Canned fruit in a light or heavy syrup. Fried fruit. Fruit in cream or butter sauce. Meat and other protein foods Fatty cuts of meat. Ribs. Fried meat. Bacon. Sausage. Bologna and other processed lunch meats. Salami. Fatback. Hotdogs. Bratwurst. Salted nuts and seeds. Canned beans with added salt. Canned or smoked fish. Whole eggs or egg yolks. Chicken or turkey with skin. Dairy Whole or 2% milk, cream, and half-and-half. Whole or full-fat cream cheese. Whole-fat or sweetened yogurt. Full-fat cheese. Nondairy creamers. Whipped toppings.  Processed cheese and cheese spreads. Fats and oils Butter. Stick margarine. Lard. Shortening. Ghee. Bacon fat. Tropical oils, such as coconut, palm kernel, or palm oil. Seasoning and other foods Salted popcorn and pretzels. Onion salt, garlic salt, seasoned salt, table salt, and sea salt. Worcestershire sauce. Tartar sauce. Barbecue sauce. Teriyaki sauce. Soy sauce, including reduced-sodium. Steak sauce. Canned and packaged gravies. Fish sauce. Oyster sauce. Cocktail sauce. Horseradish that you find on the shelf. Ketchup. Mustard. Meat flavorings and tenderizers. Bouillon cubes. Hot sauce and Tabasco sauce. Premade or packaged marinades. Premade or packaged taco seasonings. Relishes. Regular salad dressings. Where to find more information:  National Heart, Lung, and Blood Institute: www.nhlbi.nih.gov  American Heart Association: www.heart.org Summary  The DASH eating plan is a healthy eating plan that has been shown to reduce high blood pressure (hypertension). It may also reduce your risk for type 2 diabetes, heart disease, and stroke.  With the DASH eating plan, you should limit salt (sodium) intake to 2,300 mg a day. If you have hypertension, you may need to reduce your sodium intake to 1,500 mg a day.  When on the DASH eating plan, aim to eat more fresh fruits and vegetables, whole grains, lean proteins, low-fat dairy, and heart-healthy fats.  Work with your health care provider or diet and nutrition specialist (dietitian) to adjust your eating plan to your   individual calorie needs. This information is not intended to replace advice given to you by your health care provider. Make sure you discuss any questions you have with your health care provider. Document Revised: 02/02/2017 Document Reviewed: 02/14/2016 Elsevier Patient Education  2020 [employer]  

## 2019-03-10 NOTE — Progress Notes (Signed)
BP 132/76 (BP Location: Left Arm, Patient Position: Sitting, Cuff Size: Normal)   Pulse 82 Comment: apical  Temp 98.4 F (36.9 C) (Oral)   SpO2 99%    Subjective:    Patient ID: Norma Hill, female    DOB: 1945-06-25, 74 y.o.   MRN: [id number]  HPI: Ambrosia Usry Hill is a 74 y.o. female  Chief Complaint  Patient presents with  . Follow-up    rehab  . Memory Loss   HYPERTENSION / HYPERLIPIDEMIA Currently taking Crestor, started in October 2020 after CVA, and Cardizem, Metoprolol, and Eliquis.  Recent echo performed in September 2020 noting EF 40-45% with mildly decreased left ventricle function and mild LVH.   Satisfied with current treatment? yes Duration of hypertension: chronic BP monitoring frequency: not checking BP range:  BP medication side effects: no Duration of hyperlipidemia: chronic Cholesterol medication side effects: no Cholesterol supplements: none Medication compliance: good compliance Aspirin: yes Recent stressors: no Recurrent headaches: no Visual changes: no Palpitations: no Dyspnea: no Chest pain: no Lower extremity edema: no Dizzy/lightheaded: no   ATRIAL FIBRILLATION Followed by Dr. Dwyane Dee at [facility], last seen 02/07/2018.   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  Went to [facility] for rehab and was discharge prior to Christmas.  No history of smoking or alcohol use.  She is unsure of family history of stroke.  Is supposed to have OT/PT in home starting this week.  Her  husband reports concerns of memory loss, forgetting where she puts items and trouble finding clothes.  Is able to perform ADLs with minimal difficulty.  He reports that he is retired and able to assist her at home.  She does endorse memory changes since stroke.  Nor currently established with neurology.  No behaviors reported.  She is continent of urine and stool.  Continues to eat meals well and take medication without difficulty.  6CIT Screen 03/10/2019  What Year? 4 points  What month? 0 points  What time? 0 points  Count back from 20 0 points  Months in reverse 4 points  Repeat phrase 10 points  Total Score 18   Relevant past medical, surgical, family and social history reviewed and updated as indicated. Interim medical history since our last visit reviewed. Allergies and medications reviewed and updated.  Review of Systems  Constitutional: Negative for activity change, appetite change, diaphoresis, fatigue and fever.  Respiratory: Negative for cough, chest tightness, shortness of breath and wheezing.   Cardiovascular: Negative for chest pain, palpitations and leg swelling.  Gastrointestinal: Negative.   Neurological: Negative for dizziness, syncope, facial asymmetry, speech difficulty, weakness, light-headedness, numbness and headaches.  Psychiatric/Behavioral: Negative.     Per HPI unless specifically indicated above     Objective:    BP 132/76 (BP Location: Left Arm, Patient Position: Sitting, Cuff Size: Normal)   Pulse 82 Comment: apical  Temp 98.4 F (36.9 C) (Oral)   SpO2 99%   Wt Readings from Last 3 Encounters:  12/24/18 150 lb (68 kg)  12/19/18 133 lb 9.6 oz (60.6 kg)  12/04/18 147 lb (  66.7 kg)    Physical Exam Vitals and nursing note reviewed.  Constitutional:      General: She is awake. She is not in acute distress.    Appearance: She is well-developed. She is not ill-appearing.  HENT:     Head: Normocephalic.     Right Ear: Hearing normal.     Left Ear:  Hearing normal.  Eyes:     General: Lids are normal.        Right eye: No discharge.        Left eye: No discharge.     Extraocular Movements: Extraocular movements intact.     Conjunctiva/sclera: Conjunctivae normal.     Pupils: Pupils are equal, round, and reactive to light.     Visual Fields: Right eye visual fields normal and left eye visual fields normal.     Comments: No nystagmus noted.  Neck:     Thyroid: No thyromegaly.     Vascular: No carotid bruit.  Cardiovascular:     Rate and Rhythm: Normal rate. Rhythm irregularly irregular.     Heart sounds: Normal heart sounds. No murmur. No gallop.   Pulmonary:     Effort: Pulmonary effort is normal. No accessory muscle usage or respiratory distress.     Breath sounds: Normal breath sounds.  Abdominal:     General: Bowel sounds are normal.     Palpations: Abdomen is soft.     Tenderness: There is no abdominal tenderness.  Musculoskeletal:     Cervical back: Normal range of motion and neck supple.     Right lower leg: No edema.     Left lower leg: No edema.  Skin:    General: Skin is warm and dry.  Neurological:     Mental Status: She is alert.     Cranial Nerves: Cranial nerves are intact.     Gait: Gait is intact.     Deep Tendon Reflexes:     Reflex Scores:      Brachioradialis reflexes are 1+ on the right side and 1+ on the left side.      Patellar reflexes are 1+ on the right side and 1+ on the left side.    Comments: Oriented to person and place, but reports year as 2011.  Psychiatric:        Attention and Perception: Attention normal.        Mood and Affect: Mood normal.        Speech: Speech normal.        Behavior: Behavior normal. Behavior is cooperative.     Results for orders placed or performed during the hospital encounter of 12/24/18  Urine culture   Specimen: Urine, Random  Result Value Ref Range   Specimen Description      URINE, RANDOM Performed at [facility], [address]  [place], [place] [address]    Special Requests      NONE Performed at [facility], [address] [place], [place] [address]    Culture >=100,000 COLONIES/mL YEAST (A)    Report Status 12/25/2018 FINAL   SARS CORONAVIRUS 2 (TAT 6-24 HRS) Nasopharyngeal Nasopharyngeal Swab   Specimen: Nasopharyngeal Swab  Result Value Ref Range   SARS Coronavirus 2 NEGATIVE NEGATIVE  CBC with Differential  Result Value Ref Range   WBC 6.7 4.0 - 10.5 K/uL   RBC 4.50 3.87 - 5.11 MIL/uL   Hemoglobin 12.7 12.0 - 15.0 g/dL   HCT 39.5 36.0 - 46.0 %  MCV 87.8 80.0 - 100.0 fL   MCH 28.2 26.0 - 34.0 pg   MCHC 32.2 30.0 - 36.0 g/dL   RDW 15.1 11.5 - 15.5 %   Platelets 201 150 - 400 K/uL   nRBC 0.0 0.0 - 0.2 %   Neutrophils Relative % 66 %   Neutro Abs 4.5 1.7 - 7.7 K/uL   Lymphocytes Relative 24 %   Lymphs Abs 1.6 0.7 - 4.0 K/uL   Monocytes Relative 8 %   Monocytes Absolute 0.5 0.1 - 1.0 K/uL   Eosinophils Relative 1 %   Eosinophils Absolute 0.0 0.0 - 0.5 K/uL   Basophils Relative 1 %   Basophils Absolute 0.0 0.0 - 0.1 K/uL   Immature Granulocytes 0 %   Abs Immature Granulocytes 0.02 0.00 - 0.07 K/uL  Comprehensive metabolic panel  Result Value Ref Range   Sodium 142 135 - 145 mmol/L   Potassium 3.5 3.5 - 5.1 mmol/L   Chloride 104 98 - 111 mmol/L   CO2 29 22 - 32 mmol/L   Glucose, Bld 110 (H) 70 - 99 mg/dL   BUN 21 8 - 23 mg/dL   Creatinine, Ser 0.78 0.44 - 1.00 mg/dL   Calcium 9.7 8.9 - 10.3 mg/dL   Total Protein 6.3 (L) 6.5 - 8.1 g/dL   Albumin 3.8 3.5 - 5.0 g/dL   AST 54 (H) 15 - 41 U/L   ALT 56 (H) 0 - 44 U/L   Alkaline Phosphatase 86 38 - 126 U/L   Total Bilirubin 1.0 0.3 - 1.2 mg/dL   GFR calc non Af Amer >60 >60 mL/min   GFR calc Af Amer >60 >60 mL/min   Anion gap 9 5 - 15  Urinalysis, Complete w Microscopic  Result Value Ref Range   Color, Urine AMBER (A) YELLOW   APPearance CLOUDY (A) CLEAR   Specific Gravity, Urine 1.025 1.005 - 1.030   pH 5.0 5.0 - 8.0   Glucose, UA  NEGATIVE NEGATIVE mg/dL   Hgb urine dipstick LARGE (A) NEGATIVE   Bilirubin Urine NEGATIVE NEGATIVE   Ketones, ur 20 (A) NEGATIVE mg/dL   Protein, ur 100 (A) NEGATIVE mg/dL   Nitrite NEGATIVE NEGATIVE   Leukocytes,Ua LARGE (A) NEGATIVE   RBC / HPF >50 (H) 0 - 5 RBC/hpf   WBC, UA >50 (H) 0 - 5 WBC/hpf   Bacteria, UA RARE (A) NONE SEEN   Squamous Epithelial / LPF 0-5 0 - 5   Mucus PRESENT    Budding Yeast PRESENT    Hyaline Casts, UA PRESENT   Basic metabolic panel  Result Value Ref Range   Sodium 144 135 - 145 mmol/L   Potassium 3.2 (L) 3.5 - 5.1 mmol/L   Chloride 110 98 - 111 mmol/L   CO2 25 22 - 32 mmol/L   Glucose, Bld 100 (H) 70 - 99 mg/dL   BUN 18 8 - 23 mg/dL   Creatinine, Ser 0.56 0.44 - 1.00 mg/dL   Calcium 9.0 8.9 - 10.3 mg/dL   GFR calc non Af Amer >60 >60 mL/min   GFR calc Af Amer >60 >60 mL/min   Anion gap 9 5 - 15  CBC  Result Value Ref Range   WBC 6.0 4.0 - 10.5 K/uL   RBC 4.11 3.87 - 5.11 MIL/uL   Hemoglobin 11.4 (L) 12.0 - 15.0 g/dL   HCT 36.3 36.0 - 46.0 %   MCV 88.3 80.0 - 100.0 fL   MCH 27.7 26.0 - 34.0 pg  MCHC 31.4 30.0 - 36.0 g/dL   RDW 15.3 11.5 - 15.5 %   Platelets 189 150 - 400 K/uL   nRBC 0.0 0.0 - 0.2 %  Basic metabolic panel  Result Value Ref Range   Sodium 143 135 - 145 mmol/L   Potassium 3.9 3.5 - 5.1 mmol/L   Chloride 112 (H) 98 - 111 mmol/L   CO2 25 22 - 32 mmol/L   Glucose, Bld 105 (H) 70 - 99 mg/dL   BUN 14 8 - 23 mg/dL   Creatinine, Ser 0.55 0.44 - 1.00 mg/dL   Calcium 9.1 8.9 - 10.3 mg/dL   GFR calc non Af Amer >60 >60 mL/min   GFR calc Af Amer >60 >60 mL/min   Anion gap 6 5 - 15  Magnesium  Result Value Ref Range   Magnesium 1.9 1.7 - 2.4 mg/dL  Troponin I (High Sensitivity)  Result Value Ref Range   Troponin I (High Sensitivity) 13 <18 ng/L      Assessment & Plan:   Problem List Items Addressed This Visit      Cardiovascular and Mediastinum   Atrial fibrillation with RVR (HCC)    Chronic, ongoing.  Continue  collaboration with cardiology at [facility] (they have appointment upcoming) and current medication regimen.  Rate control present, 80's apical.        Relevant Orders   Referral to Chronic Care Management Services   CVA (cerebral vascular accident) [facility])    Recent in October 2020.  Will place neurology referral for ongoing collaboration and recommendations.  Discussed at length CVA prevention goals: BP <130/90, A1C < 6.5%, and LDL <70.        Relevant Orders   Ambulatory referral to Neurology   Referral to Chronic Care Management Services   Essential hypertension    Chronic, ongoing with post-CVA goal <130/90.  Initial BP elevated today, but repeat below goal and episodes of dizziness during recent rehab admission with low BP.  Will continue Diltiazem and Metoprolol at this time, however if elevations present now that is home would restart Lisinopril.  Have recommended they check BP at home daily and document. Continue collaboration with cardiology.  Plan for follow-up in 4 weeks to assess BP.        Systolic heart failure (HCC)    EF reduced on recent echo.  Continue collaboration with cardiology at [facility], report upcoming appointment.  Continue current medication regimen and adjust as needed, including Metoprolol.  Adjust regimen as needed and monitor for acute exacerbations.      Relevant Orders   Basic Metabolic Panel (BMET)   CBC with Differential/Platelet out   Referral to Chronic Care Management Services     Nervous and Auditory   Dementia [facility]) - Primary    Suspect vascular in nature due to recent CVA.  6CIT 18 today.  Will place referral to neurology for ongoing collaboration and recommendations.  Discussed at length CVA prevention goals, as noted above under HTN plan.        Relevant Orders   Ambulatory referral to Neurology   Referral to Chronic Care Management Services     Other   Elevated LDL cholesterol level    Goal for stroke prevention is < 70.  Continue Crestor at 10 MG  and adjust as needed.  Lipid panel today.      Relevant Orders   Lipid Panel w/o Chol/HDL Ratio out      Time: 30 minutes, >50% spent counseling on CVA and  memory changes  Follow up plan: Return in about 4 weeks (around 04/07/2019) for HTN/HLD and Memory.

## 2019-03-10 NOTE — Assessment & Plan Note (Signed)
Recent in October 2020.  Will place neurology referral for ongoing collaboration and recommendations.  Discussed at length CVA prevention goals: BP <130/90, A1C < 6.5%, and LDL <70.

## 2019-03-10 NOTE — Assessment & Plan Note (Addendum)
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  Initial BP elevated today, but repeat below goal and episodes of dizziness during recent rehab admission with low BP.  Will continue Diltiazem and Metoprolol at this time, however if elevations present now that is home would restart Lisinopril.  Have recommended they check BP at home daily and document. Continue collaboration with cardiology.  Plan for follow-up in 4 weeks to assess BP.

## 2019-03-10 NOTE — Assessment & Plan Note (Signed)
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  6CIT 18 today.  Will place referral to neurology for ongoing collaboration and recommendations.  Discussed at length CVA prevention goals, as noted above under HTN plan.

## 2019-03-10 NOTE — Assessment & Plan Note (Signed)
Chronic, ongoing.  Continue collaboration with cardiology at [facility] (they have appointment upcoming) and current medication regimen.  Rate control present, 80's apical.

## 2019-03-10 NOTE — Assessment & Plan Note (Signed)
Goal for stroke prevention is < 70.  Continue Crestor at 10 MG and adjust as needed.  Lipid panel today.

## 2019-03-10 NOTE — Assessment & Plan Note (Signed)
EF reduced on recent echo.  Continue collaboration with cardiology at [facility], report upcoming appointment.  Continue current medication regimen and adjust as needed, including Metoprolol.  Adjust regimen as needed and monitor for acute exacerbations.

## 2019-03-11 ENCOUNTER — Telehealth: Payer: Self-pay | Admitting: Nurse Practitioner

## 2019-03-11 LAB — CBC WITH DIFFERENTIAL/PLATELET
Basophils Absolute: 0.1 10*3/uL (ref 0.0–0.2)
Basos: 1 %
EOS (ABSOLUTE): 0.1 10*3/uL (ref 0.0–0.4)
Eos: 1 %
Hematocrit: 41.2 % (ref 34.0–46.6)
Hemoglobin: 13.4 g/dL (ref 11.1–15.9)
Immature Grans (Abs): 0 10*3/uL (ref 0.0–0.1)
Immature Granulocytes: 0 %
Lymphocytes Absolute: 1.9 10*3/uL (ref 0.7–3.1)
Lymphs: 28 %
MCH: 30.3 pg (ref 26.6–33.0)
MCHC: 32.5 g/dL (ref 31.5–35.7)
MCV: 93 fL (ref 79–97)
Monocytes Absolute: 0.6 10*3/uL (ref 0.1–0.9)
Monocytes: 9 %
Neutrophils Absolute: 4 10*3/uL (ref 1.4–7.0)
Neutrophils: 61 %
Platelets: 226 10*3/uL (ref 150–450)
RBC: 4.42 x10E6/uL (ref 3.77–5.28)
RDW: 13.4 % (ref 11.7–15.4)
WBC: 6.7 10*3/uL (ref 3.4–10.8)

## 2019-03-11 LAB — LIPID PANEL W/O CHOL/HDL RATIO
Cholesterol, Total: 166 mg/dL (ref 100–199)
HDL: 84 mg/dL (ref >39)
LDL Chol Calc (NIH): 70 mg/dL (ref 0–99)
Triglycerides: 60 mg/dL (ref 0–149)
VLDL Cholesterol Cal: 12 mg/dL (ref 5–40)

## 2019-03-11 LAB — BASIC METABOLIC PANEL
BUN/Creatinine Ratio: 22 (ref 12–28)
BUN: 16 mg/dL (ref 8–27)
CO2: 26 mmol/L (ref 20–29)
Calcium: 9.8 mg/dL (ref 8.7–10.3)
Chloride: 105 mmol/L (ref 96–106)
Creatinine, Ser: 0.72 mg/dL (ref 0.57–1.00)
GFR calc Af Amer: 95 mL/min/{1.73_m2} (ref >59)
GFR calc non Af Amer: 82 mL/min/{1.73_m2} (ref >59)
Glucose: 83 mg/dL (ref 65–99)
Potassium: 3.8 mmol/L (ref 3.5–5.2)
Sodium: 142 mmol/L (ref 134–144)

## 2019-03-11 NOTE — Chronic Care Management (AMB) (Signed)
  Chronic Care Management   Outreach Note  03/11/2019 Name: Norma Hill MRN: [id number] DOB: Oct 18, 1945  Norma Hill is a 74 y.o. year old female who is a primary care patient of Cannady, Norma Faster, NP. I reached out to Norma Hill by phone today in response to a referral sent by [name]'s PCP, Norma Guarneri NP     An unsuccessful telephone outreach was attempted today. The patient was referred to the case management team by for assistance with care management and care coordination.   Follow Up Plan: The care management team will reach out to the patient again over the next 7 days.   Glenna Durand, LPN Health Advisor, Embedded Care Coordination [facility] ??[email or web address] ??[phone]

## 2019-03-11 NOTE — Progress Notes (Signed)
Please let Norma Hill and her husband know her labs look great.  LDL is right at goal for stroke prevention, 70.  Continue Rosuvastatin.  Continue all current medication regimen.  Labs all returned within normal ranges.

## 2019-03-13 ENCOUNTER — Ambulatory Visit: Payer: Medicare Other | Admitting: Nurse Practitioner

## 2019-03-13 ENCOUNTER — Telehealth: Payer: Self-pay | Admitting: Nurse Practitioner

## 2019-03-13 NOTE — Telephone Encounter (Signed)
Left secure message with verbal orders provided

## 2019-03-13 NOTE — Telephone Encounter (Signed)
Home Health Verbal Orders - Caller/Agency: Aldrin with [facility] Number: [phone], OK to leave message Requesting: PT Frequency: 2x a week for 3 weeks and then 1x a week for 2 weeks.

## 2019-03-17 NOTE — Chronic Care Management (AMB) (Signed)
  Chronic Care Management   Note  03/17/2019 Name: Norma Hill MRN: [id number] DOB: June 16, 1945  [name] is a 74 y.o. year old female who is a primary care patient of Cannady, Barbaraann Faster, NP. I reached out to [name] by phone today in response to a referral sent by [name]'s PCP, Marnee Guarneri NP     [name] was given information about Chronic Care Management services today including:  1. CCM service includes personalized support from designated clinical staff supervised by her physician, including individualized plan of care and coordination with other care providers 2. 24/7 contact phone numbers for assistance for urgent and routine care needs. 3. Service will only be billed when office clinical staff spend 20 minutes or more in a month to coordinate care. 4. Only one practitioner may furnish and bill the service in a calendar month. 5. The patient may stop CCM services at any time (effective at the end of the month) by phone call to the office staff. 6. The patient will be responsible for cost sharing (co-pay) of up to 20% of the service fee (after annual deductible is met).  Patient did not agree to enrollment in care management services and does not wish to consider at this time.  Follow up plan: The patient has been provided with contact information for the care management team and has been advised to call with any health related questions or concerns.   Glenna Durand, LPN Health Advisor, Embedded Care Coordination [facility] ??[email or web address] ??[phone]

## 2019-03-24 ENCOUNTER — Telehealth: Payer: Self-pay | Admitting: Nurse Practitioner

## 2019-03-24 NOTE — Telephone Encounter (Signed)
Pt' s husband is her in office stating that Pt needed covid vaccine Per, front office Laural Roes)  I was told to tell him to contact health department since we do not have the vaccine here.

## 2019-03-26 ENCOUNTER — Other Ambulatory Visit: Payer: Self-pay

## 2019-03-26 MED ORDER — ROSUVASTATIN CALCIUM 10 MG PO TABS: 10.0000 mg | ORAL_TABLET | Freq: Every day | ORAL | 3 refills | Status: DC

## 2019-03-26 MED ORDER — METOPROLOL SUCCINATE ER 25 MG PO TB24: 25.0000 mg | ORAL_TABLET | Freq: Every day | ORAL | 3 refills | Status: DC

## 2019-03-26 MED ORDER — DILTIAZEM HCL ER COATED BEADS 180 MG PO CP24: 180.0000 mg | ORAL_CAPSULE | Freq: Two times a day (BID) | ORAL | 3 refills | Status: DC

## 2019-03-26 MED ORDER — TAMSULOSIN HCL 0.4 MG PO CAPS: 0.4000 mg | ORAL_CAPSULE | Freq: Every day | ORAL | 5 refills | Status: DC

## 2019-03-26 MED ORDER — APIXABAN 5 MG PO TABS: 5.0000 mg | ORAL_TABLET | Freq: Two times a day (BID) | ORAL | 3 refills | Status: DC

## 2019-03-26 NOTE — Telephone Encounter (Signed)
Pharmacy requesting 90 day supplies of all meds. Patient seen 03/10/19 and has appointment 04/08/19

## 2019-03-31 ENCOUNTER — Other Ambulatory Visit: Payer: Self-pay | Admitting: Nurse Practitioner

## 2019-03-31 ENCOUNTER — Telehealth: Payer: Self-pay | Admitting: Nurse Practitioner

## 2019-03-31 DIAGNOSIS — I639 Cerebral infarction, unspecified: Secondary | ICD-10-CM

## 2019-03-31 NOTE — Progress Notes (Signed)
Error

## 2019-03-31 NOTE — Telephone Encounter (Signed)
Definitely see if we can get her in this week vs next week for follow-up face to face.  I have placed CCM referral for assistance.  Need to review medication changes and would benefit from face to face visit to check BP.  Thanks.

## 2019-03-31 NOTE — Telephone Encounter (Signed)
Please alert her husband will definitely need more information.  How are her BP at home?  What changes did [facility] make to medications?  They have follow-up with me next week and we can review at that time and determine if need for changes, however if continued falls I would want to see her sooner.  Also I do HIGHLY recommend chronic care management referral to our team in office.  This would be VERY beneficial.  I see they declined this, but would recommend this service as having the RN case manager and pharmacist involved would be helpful.  I can place new referral if they are interested.

## 2019-03-31 NOTE — Telephone Encounter (Signed)
Patient's husband states that he has not checked her BP at home, she was doing good, until yesterday when she started to stagger around and fell to the floor, he was able to get her to the couch.  He has a copy of medications and gave [address] a copy of the new medications to have these corrected.   Please place referral to CCM.   HH states needs to be followed up on.

## 2019-03-31 NOTE — Telephone Encounter (Signed)
Pt's husband stated got dizzy yesterday and fell on the floor yesterday. She is doing fine this morning. She was released from [facility] due to her stroke. They changed her medications there as well. They were advised by patient's home health to let PCP know what had happened in case medications need to be evaluated.

## 2019-03-31 NOTE — Addendum Note (Signed)
Addended by: Marnee Guarneri T on: 03/31/2019 04:39 PM   Modules accepted: Orders

## 2019-04-01 ENCOUNTER — Telehealth: Payer: Self-pay | Admitting: Nurse Practitioner

## 2019-04-01 NOTE — Chronic Care Management (AMB) (Signed)
  Chronic Care Management   Note  04/01/2019 Name: Norma Hill MRN: [id number] DOB: 23-Nov-1945  [name] is a 74 y.o. year old female who is a primary care patient of Cannady, Barbaraann Faster, NP. I reached out to [name] by phone today in response to a referral sent by [name]'s PCP, Marnee Guarneri NP     [name] was given information about Chronic Care Management services today including:  1. CCM service includes personalized support from designated clinical staff supervised by her physician, including individualized plan of care and coordination with other care providers 2. 24/7 contact phone numbers for assistance for urgent and routine care needs. 3. Service will only be billed when office clinical staff spend 20 minutes or more in a month to coordinate care. 4. Only one practitioner may furnish and bill the service in a calendar month. 5. The patient may stop CCM services at any time (effective at the end of the month) by phone call to the office staff. 6. The patient will be responsible for cost sharing (co-pay) of up to 20% of the service fee (after annual deductible is met).  Patient did not agree to enrollment in care management services and does not wish to consider at this time.  Follow up plan: The care management team is available to follow up with the patient after provider conversation with the patient regarding recommendation for care management engagement and subsequent re-referral to the care management team.   Glenna Durand, LPN Health Advisor, [facility] ??[email or web address] ??[phone]

## 2019-04-02 ENCOUNTER — Telehealth: Payer: Self-pay

## 2019-04-02 ENCOUNTER — Ambulatory Visit: Payer: Self-pay | Admitting: General Practice

## 2019-04-02 NOTE — Telephone Encounter (Signed)
Can you check to see if this patient can be seen this week.

## 2019-04-02 NOTE — Chronic Care Management (AMB) (Signed)
  Chronic Care Management   Outreach Note  04/02/2019 Name: Norma Hill MRN: [id number] DOB: 1945-09-24  Referred by: Venita Lick, NP Reason for referral : Chronic Care Management (Initial Call: RNCM Chronic Disease Management and Care Coordination needs)   An unsuccessful telephone outreach was attempted today. The patient was referred to the case management team by for assistance with care management and care coordination.   Follow Up Plan: The care management team will reach out to the patient again over the next 14 days.   Noreene Larsson RN, MSN, [facility] Mobile: [phone]

## 2019-04-08 ENCOUNTER — Ambulatory Visit: Payer: Medicare PPO | Admitting: Nurse Practitioner

## 2019-04-08 ENCOUNTER — Other Ambulatory Visit: Payer: Self-pay

## 2019-04-08 ENCOUNTER — Encounter: Payer: Self-pay | Admitting: Nurse Practitioner

## 2019-04-08 VITALS — BP 126/72 | HR 76 | Temp 97.3°F

## 2019-04-08 DIAGNOSIS — I10 Essential (primary) hypertension: Secondary | ICD-10-CM | POA: Diagnosis not present

## 2019-04-08 DIAGNOSIS — I4891 Unspecified atrial fibrillation: Secondary | ICD-10-CM | POA: Diagnosis not present

## 2019-04-08 DIAGNOSIS — E7800 Pure hypercholesterolemia, unspecified: Secondary | ICD-10-CM | POA: Diagnosis not present

## 2019-04-08 DIAGNOSIS — Z8673 Personal history of transient ischemic attack (TIA), and cerebral infarction without residual deficits: Secondary | ICD-10-CM

## 2019-04-08 DIAGNOSIS — I5022 Chronic systolic (congestive) heart failure: Secondary | ICD-10-CM | POA: Diagnosis not present

## 2019-04-08 DIAGNOSIS — Z9181 History of falling: Secondary | ICD-10-CM | POA: Insufficient documentation

## 2019-04-08 MED ORDER — MELATONIN 5 MG PO CAPS: 5.0000 mg | ORAL_CAPSULE | ORAL | 6 refills | Status: DC | PRN

## 2019-04-08 NOTE — Patient Instructions (Signed)

## 2019-04-08 NOTE — Assessment & Plan Note (Signed)
Goal for stroke prevention is < 70.  Continue Crestor at 10 MG and adjust as needed.  Recent LDL at visit was 70.  

## 2019-04-08 NOTE — Assessment & Plan Note (Signed)
Chronic, ongoing.  Continue collaboration with cardiology at [facility] (reiterated need for them to scheduled appointment ASAP) and current medication regimen.  Rate control present, 80's apical.   

## 2019-04-08 NOTE — Progress Notes (Signed)
BP 126/72 (BP Location: Left Arm, Patient Position: Sitting)   Pulse 76   Temp (!) 97.3 F (36.3 C) (Oral)   SpO2 96%    Subjective:    Patient ID: Norma Hill, female    DOB: April 21, 1945, 74 y.o.   MRN: [id number]  HPI: Norma Hill is a 74 y.o. female  Chief Complaint  Patient presents with  . Hypertension  . Fall    recent fall, no known injuries   HYPERTENSION/HF Continues on Diltiazem and Metoprolol.  Had recent fall one week ago with no injury, she was "out of it" the remainder of day.  Did see neurology on 03/17/2019 and they discussed possibility of repeated TIA events.  Currently taking Crestor, started in October 2020 after CVA, and Cardizem, Metoprolol, and Eliquis. Recent echo performed in September 2020 noting EF 40-45% with mildly decreased left ventricle function and mild LVH.  Has no upcoming appointment with [facility], have reiterated need for her husband and her to schedule this.   Hypertension status: stable  Satisfied with current treatment? yes Duration of hypertension: chronic BP monitoring frequency:  home health was checking BP range: they do not recall numbers BP medication side effects:  no Medication compliance: good compliance Aspirin: yes Recurrent headaches: no Visual changes: no Palpitations: no Dyspnea: no Chest pain: no Lower extremity edema: no Dizzy/lightheaded: no  The ASCVD Risk score Mikey Bussing DC Jr., et al., 2013) failed to calculate for the following reasons:   The patient has a prior MI or stroke diagnosis  ATRIAL FIBRILLATION Followed by Dr. Dwyane Dee at [facility] cardiology, last seen 02/07/2018.   Atrial fibrillation status: stable Satisfied with current treatment: yes  Medication side effects:  no Medication compliance: good compliance Etiology of atrial fibrillation:  Palpitations:  no Chest pain:  no Dyspnea on exertion:  no Orthopnea:  no Syncope:  no Edema:  no Ventricular rate control: diltiazem Anti-coagulation: long  acting  Relevant past medical, surgical, family and social history reviewed and updated as indicated. Interim medical history since our last visit reviewed. Allergies and medications reviewed and updated.  Review of Systems  Per HPI unless specifically indicated above     Objective:    BP 126/72 (BP Location: Left Arm, Patient Position: Sitting)   Pulse 76   Temp (!) 97.3 F (36.3 C) (Oral)   SpO2 96%   Wt Readings from Last 3 Encounters:  12/24/18 150 lb (68 kg)  12/19/18 133 lb 9.6 oz (60.6 kg)  12/04/18 147 lb (66.7 kg)    Physical Exam  Results for orders placed or performed in visit on [id number]  Basic Metabolic Panel (BMET)  Result Value Ref Range   Glucose 83 65 - 99 mg/dL   BUN 16 8 - 27 mg/dL   Creatinine, Ser 0.72 0.57 - 1.00 mg/dL   GFR calc non Af Amer 82 >59 mL/min/1.73   GFR calc Af Amer 95 >59 mL/min/1.73   BUN/Creatinine Ratio 22 12 - 28   Sodium 142 134 - 144 mmol/L   Potassium 3.8 3.5 - 5.2 mmol/L   Chloride 105 96 - 106 mmol/L   CO2 26 20 - 29 mmol/L   Calcium 9.8 8.7 - 10.3 mg/dL  CBC with Differential/Platelet out  Result Value Ref Range   WBC 6.7 3.4 - 10.8 x10E3/uL   RBC 4.42 3.77 - 5.28 x10E6/uL   Hemoglobin 13.4 11.1 - 15.9 g/dL   Hematocrit 41.2 34.0 - 46.6 %   MCV 93 79 - 97  fL   MCH 30.3 26.6 - 33.0 pg   MCHC 32.5 31.5 - 35.7 g/dL   RDW 13.4 11.7 - 15.4 %   Platelets 226 150 - 450 x10E3/uL   Neutrophils 61 Not Estab. %   Lymphs 28 Not Estab. %   Monocytes 9 Not Estab. %   Eos 1 Not Estab. %   Basos 1 Not Estab. %   Neutrophils Absolute 4.0 1.4 - 7.0 x10E3/uL   Lymphocytes Absolute 1.9 0.7 - 3.1 x10E3/uL   Monocytes Absolute 0.6 0.1 - 0.9 x10E3/uL   EOS (ABSOLUTE) 0.1 0.0 - 0.4 x10E3/uL   Basophils Absolute 0.1 0.0 - 0.2 x10E3/uL   Immature Granulocytes 0 Not Estab. %   Immature Grans (Abs) 0.0 0.0 - 0.1 x10E3/uL  Lipid Panel w/o Chol/HDL Ratio  Result Value Ref Range   Cholesterol, Total 166 100 - 199 mg/dL   Triglycerides 60  0 - 149 mg/dL   HDL 84 >39 mg/dL   VLDL Cholesterol Cal 12 5 - 40 mg/dL   LDL Chol Calc (NIH) 70 0 - 99 mg/dL      Assessment & Plan:   Problem List Items Addressed This Visit      Cardiovascular and Mediastinum   Atrial fibrillation with RVR (HCC)    Chronic, ongoing.  Continue collaboration with cardiology at [facility] (reiterated need for them to scheduled appointment ASAP) and current medication regimen.  Rate control present, 80's apical.        Essential hypertension    Chronic, ongoing with post-CVA goal <130/90.  Initial BP elevated today, but repeat below goal and episodes of dizziness at home occasionally.  Will continue Diltiazem and Metoprolol at this time, however if elevations present now that is home would restart Lisinopril.  Have recommended they check BP at home daily and document. Continue collaboration with cardiology.  Plan for follow-up in 3 months, sooner if increased falls or dizziness.      Systolic heart failure (HCC) - Primary    Chronic, ongoing.  EF reduced on recent echo.  Continue collaboration with [facility]ology at [facility], have reiterated need for husband to schedule this.  Continue current medication regimen and adjust as needed, including Metoprolol.  Adjust regimen as needed and monitor for acute exacerbations. - Reminded to call for an overnight weight gain of >2 pounds or a weekly weight weight of >5 pounds - not adding salt to his food and has been reading food labels. Reviewed the importance of keeping daily sodium intake to 2000mg  daily         Other   History of stroke    Recent in October 2020.  Continue collaboration with neurology and cardiology.  Discussed at length CVA prevention goals: BP <130/90, A1C < 6.5%, and LDL <70.        Elevated LDL cholesterol level    Goal for stroke prevention is < 70.  Continue Crestor at 10 MG and adjust as needed.  Recent LDL at visit was 70.       At high risk for falls    Monitor, she refused PT in home  recently, per her husband she did not like aide that came out.  They refuse CCM referral, husband appears interested, but patient refuses.  Monitor medications and focus on fall prevention at home, using aides and slip prevention socks.            Follow up plan: Return in about 3 months (around 07/06/2019) for Atrial fib, HTN/HLD, HF.

## 2019-04-08 NOTE — Assessment & Plan Note (Addendum)
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  Initial BP elevated today, but repeat below goal and episodes of dizziness at home occasionally.  Will continue Diltiazem and Metoprolol at this time, however if elevations present now that is home would restart Lisinopril.  Have recommended they check BP at home daily and document. Continue collaboration with cardiology.  Plan for follow-up in 3 months, sooner if increased falls or dizziness.

## 2019-04-08 NOTE — Assessment & Plan Note (Signed)
Chronic, ongoing.  EF reduced on recent echo.  Continue collaboration with cardiology at [facility], have reiterated need for husband to schedule this.  Continue current medication regimen and adjust as needed, including Metoprolol.  Adjust regimen as needed and monitor for acute exacerbations. - Reminded to call for an overnight weight gain of >2 pounds or a weekly weight weight of >5 pounds - not adding salt to his food and has been reading food labels. Reviewed the importance of keeping daily sodium intake to 2000mg  daily

## 2019-04-08 NOTE — Assessment & Plan Note (Addendum)
Monitor, she refused PT in home recently, per her husband she did not like aide that came out.  They refuse CCM referral, husband appears interested, but patient refuses.  Monitor medications and focus on fall prevention at home, using aides and slip prevention socks.

## 2019-04-08 NOTE — Assessment & Plan Note (Addendum)
Recent in October 2020.  Continue collaboration with neurology and cardiology.  Discussed at length CVA prevention goals: BP <130/90, A1C < 6.5%, and LDL <70.   

## 2019-04-18 ENCOUNTER — Ambulatory Visit (INDEPENDENT_AMBULATORY_CARE_PROVIDER_SITE_OTHER): Payer: Medicare PPO

## 2019-04-18 DIAGNOSIS — I5022 Chronic systolic (congestive) heart failure: Secondary | ICD-10-CM

## 2019-04-18 DIAGNOSIS — I10 Essential (primary) hypertension: Secondary | ICD-10-CM | POA: Diagnosis not present

## 2019-04-18 DIAGNOSIS — Z9181 History of falling: Secondary | ICD-10-CM

## 2019-04-18 DIAGNOSIS — I4891 Unspecified atrial fibrillation: Secondary | ICD-10-CM | POA: Diagnosis not present

## 2019-04-18 DIAGNOSIS — F0150 Vascular dementia without behavioral disturbance: Secondary | ICD-10-CM | POA: Diagnosis not present

## 2019-04-19 NOTE — Patient Instructions (Signed)
Visit Information  Goals Addressed            This Visit's Progress    RNCM: She has fallen, she does not cook, and is now not able to dress herself       Current Barriers:   Knowledge Deficits related to the progression of a patient with dementia and the impact of caring for a patient with dementia  Nurse Case Manager Clinical Goal(s):   Over the next 120 days, patient will demonstrate a decrease in fall exacerbations as evidenced by no new falls since last outreach  Over the next 120 days, patient will attend all scheduled medical appointments: encouraged the patient's husband to follow recommendations by the pcp, RNCM will assist by calling [facility] cardiology  Over the next 120 days, patient will demonstrate improved adherence to prescribed treatment plan for chronic disease processes  as evidenced bymaintaining independence with dressing and bathing  Over the next 120 days, patient will work with CM team pharmacist to reconcile medications and assist with questions and concerns  Over the next 120 days, patient will work with CM clinical social worker to assist husband with caregiver strain and help the patient with depression and anxiety   Interventions:   Evaluation of current treatment plan related to Chronic disease management and care coordination needs and patient's adherence to plan as established by provider.  Provided education to patient re: to seeing if the patient would qualify for Pallative Consult through Hospice  Discussed plans with patient for ongoing care management follow up and provided patient with direct contact information for care management team  Advised patient, providing education and rationale, to monitor blood pressure daily and record, calling pcp/cardiologist  for findings outside established parameters.   Reviewed scheduled/upcoming provider appointments including: the need to see cardiology for evaluation and treatment of bilateral lower extremity  edema  Advised patient, providing education and rationale, to weigh daily and record, calling pcp/cardiologist  for weight gain of 3lbs overnight or 5 pounds in a week.   Social Work referral for caregiver strain, patients husband stating the patient is depressed about losing her independence   Pharmacy referral for medication reconciliation and any questions or concerns about medications  Patient Self Care Activities:   Patient verbalizes understanding of plan to involve the CCM team to assist with the needs of the patient   Attends all scheduled provider appointments  Performs ADL's independently  Calls provider office for new concerns or questions  Unable to independently manage care related to decline in mental status and dementia   Unable to self administer medications as prescribed  Does not adhere to provider recommendations re: making a follow up appointment to see cardiologist due to increased swelling in bilateral lower extremities   Unable to perform ADLs independently  Unable to perform IADLs independently  Initial goal documentation      RNCM: She is not able to take care of herself       Current Barriers:   Chronic Disease Management support, education, and care coordination needs related to Atrial Fibrillation, CHF, HTN, and Dementia  Clinical Goal(s) related to Atrial Fibrillation, CHF, HTN, and Dementia:  Over the next 120 days, patient will:   Work with the care management team to address educational, disease management, and care coordination needs   Begin or continue self health monitoring activities as directed today Measure and record blood pressure 3 times per week and Measure and record weight daily  Call provider office for new or  worsened signs and symptoms Blood pressure findings outside established parameters, Weight outside established parameters, Shortness of breath, and New or worsened symptom related to dementia and Chronic Health  conditions  Call care management team with questions or concerns  Verbalize basic understanding of patient centered plan of care established today  Interventions related to Atrial Fibrillation, CHF, HTN, and Dementia:   Evaluation of current treatment plans and patient's adherence to plan as established by provider  Assessed patient understanding of disease states  Assessed patient's education and care coordination needs  Provided disease specific education to patient   Collaborated with appropriate clinical care team members regarding patient needs  RNCM will assist with calling cardiology office to get the patient in to be seen by cardiologist   Patient Self Care Activities related to Atrial Fibrillation, CHF, HTN, and Dementia:   Patient is unable to independently self-manage chronic health conditions  Initial goal documentation        Ms. Smitherman was given information about Chronic Care Management services today including:  1. CCM service includes personalized support from designated clinical staff supervised by her physician, including individualized plan of care and coordination with other care providers 2. 24/7 contact phone numbers for assistance for urgent and routine care needs. 3. Service will only be billed when office clinical staff spend 20 minutes or more in a month to coordinate care. 4. Only one practitioner may furnish and bill the service in a calendar month. 5. The patient may stop CCM services at any time (effective at the end of the month) by phone call to the office staff. 6. The patient will be responsible for cost sharing (co-pay) of up to 20% of the service fee (after annual deductible is met).  Patient agreed to services and verbal consent obtained.   The patient verbalized understanding of instructions provided today and declined a print copy of patient instruction materials.   The care management team will reach out to the patient again over the  next 30 to 60 days.   Noreene Larsson RN, MSN, [facility] Mobile: [phone]

## 2019-04-19 NOTE — Chronic Care Management (AMB) (Signed)
Chronic Care Management   Initial Visit Note  04/19/2019 Name: Norma Hill MRN: [id number] DOB: [birth year]  Referred by: Venita Lick, NP Reason for referral : Chronic Care Management (Initial: AFib/HTN/Heart Failure/Dementia )   Norma Hill is a 74 y.o. year old female who is a primary care patient of Cannady, Barbaraann Faster, NP. The CCM team was consulted for assistance with chronic disease management and care coordination needs related to Atrial Fibrillation, CHF, HTN and Dementia  Review of patient status, including review of consultants reports, relevant laboratory and other test results, and collaboration with appropriate care team members and the patient's provider was performed as part of comprehensive patient evaluation and provision of chronic care management services.    SDOH (Social Determinants of Health) screening performed today: Biomedical engineer  Food Insecurity  Depression   Stress Physical Activity. See Care Plan for related entries.   Medications: Outpatient Encounter Medications as of 04/18/2019  Medication Sig   aspirin EC 81 MG EC tablet Take 1 tablet (81 mg total) by mouth daily.   metoprolol succinate (TOPROL-XL) 25 MG 24 hr tablet Take 1 tablet (25 mg total) by mouth daily.   acetaminophen (TYLENOL) 325 MG tablet Take 2 tablets (650 mg total) by mouth every 6 (six) hours as needed for mild pain (or Fever >/= 101).   apixaban (ELIQUIS) 5 MG TABS tablet Take 1 tablet (5 mg total) by mouth 2 (two) times daily.   diltiazem (CARDIZEM CD) 180 MG 24 hr capsule Take 1 capsule (180 mg total) by mouth 2 (two) times daily.   Melatonin 5 MG CAPS Take 1 capsule (5 mg total) by mouth as needed. Take at night as needed for sleep.   polyethylene glycol (MIRALAX / GLYCOLAX) 17 g packet Take 17 g by mouth 2 (two) times daily. (Patient not taking: Reported on 04/08/2019)   rosuvastatin (CRESTOR) 10 MG tablet Take 1 tablet (10 mg total) by  mouth daily.   senna (SENOKOT) 8.6 MG TABS tablet Take 2 tablets by mouth 2 (two) times daily.   tamsulosin (FLOMAX) 0.4 MG CAPS capsule Take 1 capsule (0.4 mg total) by mouth daily.   No facility-administered encounter medications on file as of 04/18/2019.     Objective:   Goals Addressed            This Visit's Progress    RNCM: She has fallen, she does not cook, and is now not able to dress herself       Current Barriers:   Knowledge Deficits related to the progression of a patient with dementia and the impact of caring for a patient with dementia  Nurse Case Manager Clinical Goal(s):   Over the next 120 days, patient will demonstrate a decrease in fall exacerbations as evidenced by no new falls since last outreach  Over the next 120 days, patient will attend all scheduled medical appointments: encouraged the patient's husband to follow recommendations by the pcp, RNCM will assist by calling [facility] cardiology  Over the next 120 days, patient will demonstrate improved adherence to prescribed treatment plan for chronic disease processes  as evidenced bymaintaining independence with dressing and bathing  Over the next 120 days, patient will work with CM team pharmacist to reconcile medications and assist with questions and concerns  Over the next 120 days, patient will work with CM clinical social worker to assist husband with caregiver strain and help the patient with depression and anxiety   Interventions:  Evaluation of current treatment plan related to Chronic disease management and care coordination needs and patient's adherence to plan as established by provider.  Provided education to patient re: to seeing if the patient would qualify for Pallative Consult through Hospice  Discussed plans with patient for ongoing care management follow up and provided patient with direct contact information for care management team  Advised patient, providing education and rationale, to  monitor blood pressure daily and record, calling pcp/cardiologist  for findings outside established parameters.   Reviewed scheduled/upcoming provider appointments including: the need to see cardiology for evaluation and treatment of bilateral lower extremity edema  Advised patient, providing education and rationale, to weigh daily and record, calling pcp/cardiologist  for weight gain of 3lbs overnight or 5 pounds in a week.   Social Work referral for caregiver strain, patients husband stating the patient is depressed about losing her independence   Pharmacy referral for medication reconciliation and any questions or concerns about medications  Patient Self Care Activities:   Patient verbalizes understanding of plan to involve the CCM team to assist with the needs of the patient   Attends all scheduled provider appointments  Performs ADL's independently  Calls provider office for new concerns or questions  Unable to independently manage care related to decline in mental status and dementia   Unable to self administer medications as prescribed  Does not adhere to provider recommendations re: making a follow up appointment to see cardiologist due to increased swelling in bilateral lower extremities   Unable to perform ADLs independently  Unable to perform IADLs independently  Initial goal documentation      RNCM: She is not able to take care of herself       Current Barriers:   Chronic Disease Management support, education, and care coordination needs related to Atrial Fibrillation, CHF, HTN, and Dementia  Clinical Goal(s) related to Atrial Fibrillation, CHF, HTN, and Dementia:  Over the next 120 days, patient will:   Work with the care management team to address educational, disease management, and care coordination needs   Begin or continue self health monitoring activities as directed today Measure and record blood pressure 3 times per week and Measure and record weight  daily  Call provider office for new or worsened signs and symptoms Blood pressure findings outside established parameters, Weight outside established parameters, Shortness of breath, and New or worsened symptom related to dementia and Chronic Health conditions  Call care management team with questions or concerns  Verbalize basic understanding of patient centered plan of care established today  Interventions related to Atrial Fibrillation, CHF, HTN, and Dementia:   Evaluation of current treatment plans and patient's adherence to plan as established by provider  Assessed patient understanding of disease states  Assessed patient's education and care coordination needs  Provided disease specific education to patient   Collaborated with appropriate clinical care team members regarding patient needs  RNCM will assist with calling cardiology office to get the patient in to be seen by cardiologist   Patient Self Care Activities related to Atrial Fibrillation, CHF, HTN, and Dementia:   Patient is unable to independently self-manage chronic health conditions  Initial goal documentation         Norma Hill was given information about Chronic Care Management services today including:  1. CCM service includes personalized support from designated clinical staff supervised by her physician, including individualized plan of care and coordination with other care providers 2. 24/7 contact phone numbers for assistance for  urgent and routine care needs. 3. Service will only be billed when office clinical staff spend 20 minutes or more in a month to coordinate care. 4. Only one practitioner may furnish and bill the service in a calendar month. 5. The patient may stop CCM services at any time (effective at the end of the month) by phone call to the office staff. 6. The patient will be responsible for cost sharing (co-pay) of up to 20% of the service fee (after annual deductible is met).  Patient  agreed to services and verbal consent obtained.   Plan:   The care management team will reach out to the patient again over the next 30 to 60 days.   Noreene Larsson RN, MSN, [facility] Mobile: [phone]

## 2019-05-27 ENCOUNTER — Telehealth: Payer: Self-pay

## 2019-05-27 ENCOUNTER — Ambulatory Visit: Payer: Self-pay | Admitting: Pharmacist

## 2019-05-27 NOTE — Chronic Care Management (AMB) (Signed)
  Chronic Care Management   Note  05/27/2019 Name: Norma Hill MRN: [id number] DOB: 10-May-1945  [name] is a 74 y.o. year old female who is a primary care patient of Cannady, Barbaraann Faster, NP. The CCM team was consulted for assistance with chronic disease management and care coordination needs.    Attempted to contact patient for medication management review. Left HIPAA compliant message for patient to return my call at their convenience.   Plan: - Will collaborate with Care Guide to outreach to schedule follow up with me  Catie Darnelle Maffucci, PharmD, [place] [phone]

## 2019-05-28 ENCOUNTER — Telehealth: Payer: Self-pay | Admitting: Nurse Practitioner

## 2019-05-28 NOTE — Chronic Care Management (AMB) (Signed)
  Care Management   Note  05/28/2019 Name: Norma Hill MRN: [id number] DOB: 1945-04-21  Norma Hill is a 74 y.o. year old female who is a primary care patient of Venita Lick, NP and is actively engaged with the care management team. I reached out to [name] by phone today to assist with scheduling an initial visit with the Pharmacist  Follow up plan: Unsuccessful telephone outreach attempt made. A HIPPA compliant phone message was left for the patient providing contact information and requesting a return call.  The care management team will reach out to the patient again over the next 7 days.  If patient returns call to provider office, please advise to call [facility]  at [place], [place], [place], [place] [address] Direct Dial: [phone] [email or web address] Website: [email or web address]

## 2019-05-30 NOTE — Chronic Care Management (AMB) (Signed)
  Care Management   Note  05/30/2019 Name: Norma Hill MRN: [id number] DOB: 16-Apr-1945  Norma Hill is a 74 y.o. year old female who is a primary care patient of Venita Lick, NP and is actively engaged with the care management team. I reached out to [name] by phone today to assist with scheduling an initial visit with the Pharmacist  Follow up plan: Second Unsuccessful telephone outreach attempt made. A HIPPA compliant phone message was left for the patient providing contact information and requesting a return call.  The care management team will reach out to the patient again over the next 7 days.  If patient returns call to provider office, please advise to call [place]  at [place], [place], [place], [place] [address] Direct Dial: [phone] [email or web address]: [email or web address]

## 2019-06-02 NOTE — Chronic Care Management (AMB) (Signed)
  Care Management   Note  06/02/2019 Name: Burnette Antone MRN: [id number] DOB: 11-05-1945  Linward Foster Wittke is a 74 y.o. year old female who is a primary care patient of Venita Lick, NP and is actively engaged with the care management team. I reached out to [name] by phone today to assist with scheduling an initial visit with the Pharmacist  Follow up plan: Telephone appointment with care management team member scheduled for:07/09/2019  Noreene Larsson, [place], [place], [place] [address] Direct Dial: [phone] [email or web address] Website: [email or web address]

## 2019-06-04 ENCOUNTER — Ambulatory Visit (INDEPENDENT_AMBULATORY_CARE_PROVIDER_SITE_OTHER): Payer: Medicare PPO

## 2019-06-04 VITALS — Wt 155.0 lb

## 2019-06-04 DIAGNOSIS — Z0000 Encounter for general adult medical examination without abnormal findings: Secondary | ICD-10-CM | POA: Diagnosis not present

## 2019-06-04 NOTE — Patient Instructions (Signed)
Norma Hill , Thank you for taking time to come for your Medicare Wellness Visit. I appreciate your ongoing commitment to your health goals. Please review the following plan we discussed and let me know if I can assist you in the future.   Screening recommendations/referrals: Colonoscopy: discussed cologuard  Mammogram: not indicated  Bone Density: completed  Recommended yearly ophthalmology/optometry visit for glaucoma screening and checkup Recommended yearly dental visit for hygiene and checkup  Vaccinations: Influenza vaccine: up to date  Pneumococcal vaccine: completed  Tdap vaccine: up to date  Shingles vaccine: shingrix eligible    Covid-19: completed   Advanced directives: Please bring a copy of your health care power of attorney and living will to the office at your convenience.  Conditions/risks identified: none   Next appointment: Follow up in one year for your annual wellness visit    Preventive Care 65 Years and Older, Female Preventive care refers to lifestyle choices and visits with your health care provider that can promote health and wellness. What does preventive care include?  A yearly physical exam. This is also called an annual well check.  Dental exams once or twice a year.  Routine eye exams. Ask your health care provider how often you should have your eyes checked.  Personal lifestyle choices, including:  Daily care of your teeth and gums.  Regular physical activity.  Eating a healthy diet.  Avoiding tobacco and drug use.  Limiting alcohol use.  Practicing safe sex.  Taking low-dose aspirin every day.  Taking vitamin and mineral supplements as recommended by your health care provider. What happens during an annual well check? The services and screenings done by your health care provider during your annual well check will depend on your age, overall health, lifestyle risk factors, and family history of disease. Counseling  Your health care  provider may ask you questions about your:  Alcohol use.  Tobacco use.  Drug use.  Emotional well-being.  Home and relationship well-being.  Sexual activity.  Eating habits.  History of falls.  Memory and ability to understand (cognition).  Work and work Statistician.  Reproductive health. Screening  You may have the following tests or measurements:  Height, weight, and BMI.  Blood pressure.  Lipid and cholesterol levels. These may be checked every 5 years, or more frequently if you are over 69 years old.  Skin check.  Lung cancer screening. You may have this screening every year starting at age 91 if you have a 30-pack-year history of smoking and currently smoke or have quit within the past 15 years.  Fecal occult blood test (FOBT) of the stool. You may have this test every year starting at age 37.  Flexible sigmoidoscopy or colonoscopy. You may have a sigmoidoscopy every 5 years or a colonoscopy every 10 years starting at age 28.  Hepatitis C blood test.  Hepatitis B blood test.  Sexually transmitted disease (STD) testing.  Diabetes screening. This is done by checking your blood sugar (glucose) after you have not eaten for a while (fasting). You may have this done every 1-3 years.  Bone density scan. This is done to screen for osteoporosis. You may have this done starting at age 10.  Mammogram. This may be done every 1-2 years. Talk to your health care provider about how often you should have regular mammograms. Talk with your health care provider about your test results, treatment options, and if necessary, the need for more tests. Vaccines  Your health care provider may recommend  certain vaccines, such as:  Influenza vaccine. This is recommended every year.  Tetanus, diphtheria, and acellular pertussis (Tdap, Td) vaccine. You may need a Td booster every 10 years.  Zoster vaccine. You may need this after age 23.  Pneumococcal 13-valent conjugate (PCV13)  vaccine. One dose is recommended after age 70.  Pneumococcal polysaccharide (PPSV23) vaccine. One dose is recommended after age 30. Talk to your health care provider about which screenings and vaccines you need and how often you need them. This information is not intended to replace advice given to you by your health care provider. Make sure you discuss any questions you have with your health care provider. Document Released: 03/19/2015 Document Revised: 11/10/2015 Document Reviewed: 12/22/2014 Elsevier Interactive Patient Education  2017 [place] Prevention in the Home Falls can cause injuries. They can happen to people of all ages. There are many things you can do to make your home safe and to help prevent falls. What can I do on the outside of my home?  Regularly fix the edges of walkways and driveways and fix any cracks.  Remove anything that might make you trip as you walk through a door, such as a raised step or threshold.  Trim any bushes or trees on the path to your home.  Use bright outdoor lighting.  Clear any walking paths of anything that might make someone trip, such as rocks or tools.  Regularly check to see if handrails are loose or broken. Make sure that both sides of any steps have handrails.  Any raised decks and porches should have guardrails on the edges.  Have any leaves, snow, or ice cleared regularly.  Use sand or salt on walking paths during winter.  Clean up any spills in your garage right away. This includes oil or grease spills. What can I do in the bathroom?  Use night lights.  Install grab bars by the toilet and in the tub and shower. Do not use towel bars as grab bars.  Use non-skid mats or decals in the tub or shower.  If you need to sit down in the shower, use a plastic, non-slip stool.  Keep the floor dry. Clean up any water that spills on the floor as soon as it happens.  Remove soap buildup in the tub or shower  regularly.  Attach bath mats securely with double-sided non-slip rug tape.  Do not have throw rugs and other things on the floor that can make you trip. What can I do in the bedroom?  Use night lights.  Make sure that you have a light by your bed that is easy to reach.  Do not use any sheets or blankets that are too big for your bed. They should not hang down onto the floor.  Have a firm chair that has side arms. You can use this for support while you get dressed.  Do not have throw rugs and other things on the floor that can make you trip. What can I do in the kitchen?  Clean up any spills right away.  Avoid walking on wet floors.  Keep items that you use a lot in easy-to-reach places.  If you need to reach something above you, use a strong step stool that has a grab bar.  Keep electrical cords out of the way.  Do not use floor polish or wax that makes floors slippery. If you must use wax, use non-skid floor wax.  Do not have throw rugs and other  things on the floor that can make you trip. What can I do with my stairs?  Do not leave any items on the stairs.  Make sure that there are handrails on both sides of the stairs and use them. Fix handrails that are broken or loose. Make sure that handrails are as long as the stairways.  Check any carpeting to make sure that it is firmly attached to the stairs. Fix any carpet that is loose or worn.  Avoid having throw rugs at the top or bottom of the stairs. If you do have throw rugs, attach them to the floor with carpet tape.  Make sure that you have a light switch at the top of the stairs and the bottom of the stairs. If you do not have them, ask someone to add them for you. What else can I do to help prevent falls?  Wear shoes that:  Do not have high heels.  Have rubber bottoms.  Are comfortable and fit you well.  Are closed at the toe. Do not wear sandals.  If you use a stepladder:  Make sure that it is fully  opened. Do not climb a closed stepladder.  Make sure that both sides of the stepladder are locked into place.  Ask someone to hold it for you, if possible.  Clearly mark and make sure that you can see:  Any grab bars or handrails.  First and last steps.  Where the edge of each step is.  Use tools that help you move around (mobility aids) if they are needed. These include:  Canes.  Walkers.  Scooters.  Crutches.  Turn on the lights when you go into a dark area. Replace any light bulbs as soon as they burn out.  Set up your furniture so you have a clear path. Avoid moving your furniture around.  If any of your floors are uneven, fix them.  If there are any pets around you, be aware of where they are.  Review your medicines with your doctor. Some medicines can make you feel dizzy. This can increase your chance of falling. Ask your doctor what other things that you can do to help prevent falls. This information is not intended to replace advice given to you by your health care provider. Make sure you discuss any questions you have with your health care provider. Document Released: 12/17/2008 Document Revised: 07/29/2015 Document Reviewed: 03/27/2014 Elsevier Interactive Patient Education  2017 [employer].

## 2019-06-04 NOTE — Progress Notes (Signed)
Subjective:   Norma Hill is a 74 y.o. female who presents for an Initial Medicare Annual Wellness Visit.  This visit is being conducted via phone call  - after an attmept to do on video chat - due to the COVID-19 pandemic. This patient has given me verbal consent via phone to conduct this visit, patient states they are participating from their home address. Some vital signs may be absent or patient reported.   Patient identification: identified by name, DOB, and current address.    Review of Systems      Cardiac Risk Factors include: advanced age (>42men, >67 women);dyslipidemia;hypertension     Objective:    Today's Vitals   06/04/19 1027  Weight: 155 lb (70.3 kg)   Body mass index is 27.46 kg/m.  Advanced Directives 06/04/2019 12/24/2018 12/12/2018 12/11/2018 12/08/2018 12/07/2018 12/05/2018  Does Patient Have a Medical Advance Directive? Yes No No No No No No  Type of Advance Directive [place]  Does patient want to make changes to medical advance directive? - - - - - - -  Copy of [place] in Chart? No - copy requested - - - - - -  Would patient like information on creating a medical advance directive? - No - Patient declined No - Patient declined - Yes (Inpatient - patient defers creating a medical advance directive at this time - Information given) Yes (Inpatient - patient requests chaplain consult to create a medical advance directive) Yes (Inpatient - patient defers creating a medical advance directive at this time - Information given)    Current Medications (verified) Outpatient Encounter Medications as of 06/04/2019  Medication Sig  . acetaminophen (TYLENOL) 325 MG tablet Take 2 tablets (650 mg total) by mouth every 6 (six) hours as needed for mild pain (or Fever >/= 101).  Marland Kitchen apixaban (ELIQUIS) 5 MG TABS tablet Take 1 tablet (5 mg total) by mouth 2 (two) times daily.  Marland Kitchen aspirin EC 81 MG EC tablet Take 1 tablet (81  mg total) by mouth daily.  Marland Kitchen diltiazem (CARDIZEM CD) 180 MG 24 hr capsule Take 1 capsule (180 mg total) by mouth 2 (two) times daily.  . metoprolol succinate (TOPROL-XL) 25 MG 24 hr tablet Take 1 tablet (25 mg total) by mouth daily.  . polyethylene glycol (MIRALAX / GLYCOLAX) 17 g packet Take 17 g by mouth 2 (two) times daily.  . rosuvastatin (CRESTOR) 10 MG tablet Take 1 tablet (10 mg total) by mouth daily.  Marland Kitchen senna (SENOKOT) 8.6 MG TABS tablet Take 2 tablets by mouth 2 (two) times daily.  . tamsulosin (FLOMAX) 0.4 MG CAPS capsule Take 1 capsule (0.4 mg total) by mouth daily.  . [DISCONTINUED] Melatonin 5 MG CAPS Take 1 capsule (5 mg total) by mouth as needed. Take at night as needed for sleep. (Patient not taking: Reported on 06/04/2019)   No facility-administered encounter medications on file as of 06/04/2019.    Allergies (verified) Patient has no known allergies.   History: Past Medical History:  Diagnosis Date  . A-fib ([place])   . Bowel trouble 2012   ocasionally  . Cancer ([place]) 10/2000   pt had 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 Pt then had a repeat excision on 11/16/2000  . Hypertension 2006  . Personal history of malignant neoplasm of breast 2002  . Solitary cyst of breast 2012  . Special screening for malignant neoplasms,  colon    Past Surgical History:  Procedure Laterality Date  . BREAST BIOPSY Right 2002  . BREAST SURGERY Right 2002   wide excision and sn x4 done 10/29/00 with repeat excision done on 11/16/2000 with post operative radiation treatment and 5 years of tamoxifen with completion in 2007  . CHOLECYSTECTOMY  2002  . COLONOSCOPY  2008  . DILATION AND CURETTAGE OF UTERUS  1990,s  . ELECTROPHYSIOLOGIC STUDY N/A 03/16/2016   Procedure: CARDIOVERSION;  Surgeon: Isaias Cowman, MD;  Location: [facility] ORS;  Service: Cardiovascular;  Laterality: N/A;   Family History  Problem Relation Age of Onset  . Cancer Other          breast, relationship not listed  . Dementia Mother   . Diabetes Brother    Social History   Socioeconomic History  . Marital status: Married    Spouse name: Not on file  . Number of children: Not on file  . Years of education: Not on file  . Highest education level: Not on file  Occupational History  . Not on file  Tobacco Use  . Smoking status: Never Smoker  . Smokeless tobacco: Never Used  Substance and Sexual Activity  . Alcohol use: No  . Drug use: No  . Sexual activity: Not on file  Other Topics Concern  . Not on file  Social History Narrative  . Not on file   Social Determinants of Health   Financial Resource Strain: Low Risk   . Difficulty of Paying Living Expenses: Not hard at all  Food Insecurity: No Food Insecurity  . Worried About Charity fundraiser in the Last Year: Never true  . Ran Out of Food in the Last Year: Never true  Transportation Needs: No Transportation Needs  . Lack of Transportation (Medical): No  . Lack of Transportation (Non-Medical): No  Physical Activity: Insufficiently Active  . Days of Exercise per Week: 4 days  . Minutes of Exercise per Session: 30 min  Stress: Stress Concern Present  . Feeling of Stress : Very much  Social Connections: Slightly Isolated  . Frequency of Communication with Friends and Family: More than three times a week  . Frequency of Social Gatherings with Friends and Family: More than three times a week  . Attends Religious Services: More than 4 times per year  . Active Member of Clubs or Organizations: No  . Attends Archivist Meetings: Never  . Marital Status: Married    Tobacco Counseling Counseling given: Not Answered   Clinical Intake:  Pre-visit preparation completed: Yes  Pain : No/denies pain     Nutritional Status: BMI 25 -29 Overweight Nutritional Risks: None Diabetes: No  How often do you need to have someone help you when you read instructions, pamphlets, or other  written materials from your doctor or pharmacy?: 1 - Never  Interpreter Needed?: No  Information entered by :: Lylliana Kitamura,LPN   Activities of Daily Living In your present state of health, do you have any difficulty performing the following activities: 06/04/2019 03/10/2019  Hearing? N N  Comment no hearing aids -  Vision? N N  Comment eyeglasses, [facility] -  Difficulty concentrating or making decisions? N Y  Walking or climbing stairs? N N  Dressing or bathing? N Y  Doing errands, shopping? N Y  Conservation officer, nature and eating ? N -  Using the Toilet? N -  In the past six months, have you accidently leaked urine? N -  Do you have problems with loss of bowel control? N -  Managing your Medications? N -  Managing your Finances? N -  Housekeeping or managing your Housekeeping? N -  Some recent data might be hidden     Immunizations and Health Maintenance Immunization History  Administered Date(s) Administered  . Influenza,inj,Quad PF,6+ Mos 12/17/2017  . Influenza-Unspecified 12/30/2018  . Pneumococcal Polysaccharide-23 02/11/2016   There are no preventive care reminders to display for this patient.  Patient Care Team: Venita Lick, NP as PCP - General (Nurse Practitioner) Vanita Ingles, RN as Case Manager (General Practice)  Indicate any recent Medical Services you may have received from other than [facility] providers in the past year (date may be approximate).     Assessment:   This is a routine wellness examination for [place].  Hearing/Vision screen  Hearing Screening   125Hz  250Hz  500Hz  1000Hz  2000Hz  3000Hz  4000Hz  6000Hz  8000Hz   Right ear:           Left ear:           Vision Screening Comments: [facility]  Dietary issues and exercise activities discussed: Current Exercise Habits: Home exercise routine, Type of exercise: walking;strength training/weights, Time (Minutes): 30, Frequency (Times/Week): 4, Weekly Exercise (Minutes/Week): 120, Intensity:  Mild, Exercise limited by: None identified  Goals Addressed   None    Depression Screen PHQ 2/9 Scores 06/04/2019 03/10/2019  PHQ - 2 Score 0 0  PHQ- 9 Score - 0    Fall Risk Fall Risk  06/04/2019 03/10/2019  Falls in the past year? 0 1  Number falls in past yr: 0 -  Injury with Fall? 0 -   FALL RISK PREVENTION PERTAINING TO THE HOME:  Any stairs in or around the home? No  If so, are there any without handrails? No   Home free of loose throw rugs in walkways, pet beds, electrical cords, etc? Yes  Adequate lighting in your home to reduce risk of falls? Yes   ASSISTIVE DEVICES UTILIZED TO PREVENT FALLS:  Life alert? No  Use of a cane, walker or w/c? No  Grab bars in the bathroom? No  Shower chair or bench in shower? No  Elevated toilet seat or a handicapped toilet? No    DME ORDERS:  DME order needed?  No   TIMED UP AND GO:  Unable to perform     Cognitive Function:     6CIT Screen 03/10/2019  What Year? 4 points  What month? 0 points  What time? 0 points  Count back from 20 0 points  Months in reverse 4 points  Repeat phrase 10 points  Total Score 18    Screening Tests Health Maintenance  Topic Date Due  . PNA vac Low Risk Adult (2 of 2 - PCV13) 03/13/2020 (Originally 02/10/2017)  . DEXA SCAN  06/03/2020 (Originally 10/30/2008)  . Hepatitis C Screening  06/03/2020 (Originally 09-23-1945)  . TETANUS/TDAP  06/04/2022 (Originally 10/31/1962)  . INFLUENZA VACCINE  Completed  . COLONOSCOPY  Discontinued    Qualifies for Shingles Vaccine? Yes  Zostavax completed n/a. Due for Shingrix. Education has been provided regarding the importance of this vaccine. Pt has been advised to call insurance company to determine out of pocket expense. Advised may also receive vaccine at local pharmacy or Health Dept. Verbalized acceptance and understanding.  Tdap: declined   Flu Vaccine: up to date   Pneumococcal Vaccine: completed series   Covid-19 Vaccine:  Completed vaccines  at [facility], will bring  card next visit to put in system.   Cancer Screenings:  Colorectal Screening: discussed cologuard   Mammogram: not indicated   Bone Density: completed previously.  Lung Cancer Screening: (Low Dose CT Chest recommended if Age 58-80 years, 30 pack-year currently smoking OR have quit w/in 15years.) does not qualify.   Lung Cancer Screening Referral: An Epic message has been sent to Burgess Estelle, RN (Oncology Nurse Navigator) regarding the possible need for this exam. Raquel Sarna will review the patient's chart to determine if the patient truly qualifies for the exam. If the patient qualifies, Raquel Sarna will order the Low Dose CT of the chest to facilitate the scheduling of this exam.  Additional Screening:  Hepatitis C Screening: does qualify;  Vision Screening: Recommended annual ophthalmology exams for early detection of glaucoma and other disorders of the eye. Is the patient up to date with their annual eye exam?  Yes  Who is the provider or what is the name of the office in which the pt attends annual eye exams? [facility]    Dental Screening: Recommended annual dental exams for proper oral hygiene  Community Resource Referral:  CRR required this visit?  No       Plan:  I have personally reviewed and addressed the Medicare Annual Wellness questionnaire and have noted the following in the patient's chart:  A. Medical and social history B. Use of alcohol, tobacco or illicit drugs  C. Current medications and supplements D. Functional ability and status E.  Nutritional status F.  Physical activity G. Advance directives H. List of other physicians I.  Hospitalizations, surgeries, and ER visits in previous 12 months J.  [place] such as hearing and vision if needed, cognitive and depression L. Referrals and appointments   In addition, I have reviewed and discussed with patient certain preventive protocols, quality metrics, and best practice  recommendations. A written personalized care plan for preventive services as well as general preventive health recommendations were provided to patient.   Signed,    Bevelyn Ngo, LPN   [id number]  Nurse Health Advisor   Nurse Notes: none

## 2019-06-13 ENCOUNTER — Ambulatory Visit: Payer: Self-pay

## 2019-06-13 NOTE — Chronic Care Management (AMB) (Signed)
  Care Management   Follow Up Note   06/13/2019 Name: Collyns Burczyk MRN: [id number] DOB: 1945-04-05  Referred by: Venita Lick, NP Reason for referral : [place] is a 74 y.o. year old female who is a primary care patient of Cannady, Barbaraann Faster, NP. The care management team was consulted for assistance with care management and care coordination needs.    Review of patient status, including review of consultants reports, relevant laboratory and other test results, and collaboration with appropriate care team members and the patient's provider was performed as part of comprehensive patient evaluation and provision of chronic care management services.    LCSW completed CCM outreach attempt today but was unable to reach patient successfully. A HIPPA compliant voice message was left encouraging patient to return call once available. LCSW rescheduled CCM SW appointment as well.  A HIPPA compliant phone message was left for the patient providing contact information and requesting a return call.   Eula Fried, BSW, MSW, [facility] Care Management [email or web address] Phone: [phone]

## 2019-07-09 ENCOUNTER — Ambulatory Visit: Payer: Self-pay | Admitting: Pharmacist

## 2019-07-09 ENCOUNTER — Telehealth: Payer: Medicare PPO

## 2019-07-09 NOTE — Chronic Care Management (AMB) (Signed)
  Chronic Care Management   Note  07/09/2019 Name: Norma Hill MRN: [id number] DOB: October 06, 1945  Linward Foster [name] is a 74 y.o. year old female who is a primary care patient of Cannady, Barbaraann Faster, NP. The CCM team was consulted for assistance with chronic disease management and care coordination needs.    Attempted to contact patient for medication management review and support as previously scheduled. Spoke with her husband. He was at first confused and thought that I wanted them to transfer from [place], but I attempted to explain my role as a clinical pharmacist in the office. He was uninterested in CCM services. Noted that patient has an appointment w/ PCP on Friday - husband noted that he was unaware of this appointment, and would be unable to make it, and would like a call to reschedule.   Multiple unsuccessful outreach attempts made by CCM team. Will allow patient/husband to discuss CCM role more fully with provider and determine which CCM team member would be best for outreach, as I believe we are overwhelming them with multiple calls.   Catie Darnelle Maffucci, PharmD, [place] [phone]

## 2019-07-11 ENCOUNTER — Ambulatory Visit: Payer: Medicare PPO | Admitting: Nurse Practitioner

## 2019-08-25 ENCOUNTER — Telehealth: Payer: Self-pay

## 2019-10-08 ENCOUNTER — Other Ambulatory Visit: Payer: Self-pay | Admitting: Nurse Practitioner

## 2019-12-30 ENCOUNTER — Other Ambulatory Visit: Payer: Self-pay | Admitting: Nurse Practitioner

## 2019-12-30 NOTE — Telephone Encounter (Signed)
Requested Prescriptions  Pending Prescriptions Disp Refills  . tamsulosin (FLOMAX) 0.4 MG CAPS capsule [Pharmacy Med Name: TAMSULOSIN HCL 0.4 MG CAPSULE] 30 capsule 0    Sig: Take 1 capsule (0.4 mg total) by mouth daily.     Urology: Alpha-Adrenergic Blocker Passed - 12/30/2019 10:46 AM      Passed - Last BP in normal range    BP Readings from Last 1 Encounters:  04/08/19 126/72         Passed - Valid encounter within last 12 months    Recent Outpatient Visits          8 months ago Chronic systolic heart failure ([place])   [place] Coyle, Jolene T, NP   9 months ago Vascular dementia without behavioral disturbance ([place])   [place], Henrine Screws T, NP   1 year ago Atrial fibrillation with RVR [facility])   [facility], [place], [place]   3 years ago New onset atrial fibrillation [facility])   [place], MD      Future Appointments            In 5 months [place], [facility]

## 2020-01-14 ENCOUNTER — Other Ambulatory Visit: Payer: Self-pay | Admitting: Nurse Practitioner

## 2020-02-23 ENCOUNTER — Other Ambulatory Visit: Payer: Self-pay | Admitting: Nurse Practitioner

## 2020-03-01 ENCOUNTER — Other Ambulatory Visit: Payer: Self-pay | Admitting: Nurse Practitioner

## 2020-03-23 ENCOUNTER — Other Ambulatory Visit: Payer: Self-pay | Admitting: Nurse Practitioner

## 2020-03-23 ENCOUNTER — Telehealth: Payer: Self-pay | Admitting: *Deleted

## 2020-03-23 NOTE — Telephone Encounter (Signed)
I called pt and let her know she needed an appt for her yearly check up and medication refills.   She replied,  "I have to talk with you later" and ended the call.  I gave her a 30 day courtesy refill on 2 of her prescriptions.

## 2020-04-03 ENCOUNTER — Other Ambulatory Visit: Payer: Self-pay | Admitting: Nurse Practitioner

## 2020-04-03 NOTE — Telephone Encounter (Signed)
Requested medications are due for refill today.  yes  Requested medications are on the active medications list.  Yes  Last refill.02/23/2020  Future visit scheduled.   Yes 06/07/2020  Notes to clinic.  Labs over 75 year old.

## 2020-04-05 ENCOUNTER — Other Ambulatory Visit: Payer: Self-pay | Admitting: Nurse Practitioner

## 2020-04-05 NOTE — Telephone Encounter (Signed)
Unable to lvm to make this apt/  

## 2020-04-05 NOTE — Telephone Encounter (Signed)
Requested Prescriptions  Pending Prescriptions Disp Refills  . tamsulosin (FLOMAX) 0.4 MG CAPS capsule [Pharmacy Med Name: TAMSULOSIN HCL 0.4 MG CAPSULE] 30 capsule 0    Sig: Take 1 capsule (0.4 mg total) by mouth daily.     Urology: Alpha-Adrenergic Blocker Passed - 04/05/2020  8:33 AM      Passed - Last BP in normal range    BP Readings from Last 1 Encounters:  04/08/19 126/72         Passed - Valid encounter within last 12 months    Recent Outpatient Visits          12 months ago Chronic systolic heart failure ([place])   [place], Henrine Screws T, NP   1 year ago Vascular dementia without behavioral disturbance ([place])   [place], Henrine Screws T, NP   1 year ago Atrial fibrillation with RVR [facility])   [facility], [place], [place]   3 years ago New onset atrial fibrillation [facility])   [place], MD      Future Appointments            In 2 months [place], [place]

## 2020-04-05 NOTE — Telephone Encounter (Signed)
Overdue for appointment, please schedule ASAP.

## 2020-04-06 NOTE — Telephone Encounter (Signed)
Pt stated she would call back to make this apt. 

## 2020-04-07 NOTE — Telephone Encounter (Signed)
Pt stated would call back to schedule apt.

## 2020-04-13 ENCOUNTER — Ambulatory Visit: Payer: Medicare PPO | Admitting: Nurse Practitioner

## 2020-04-13 ENCOUNTER — Other Ambulatory Visit: Payer: Self-pay

## 2020-04-13 ENCOUNTER — Ambulatory Visit: Admission: RE | Admit: 2020-04-13 | Payer: Medicare PPO | Source: Ambulatory Visit

## 2020-04-13 ENCOUNTER — Encounter: Payer: Self-pay | Admitting: Nurse Practitioner

## 2020-04-13 ENCOUNTER — Other Ambulatory Visit (HOSPITAL_COMMUNITY)
Admission: RE | Admit: 2020-04-13 | Discharge: 2020-04-13 | Disposition: A | Discharge status: Home / Self Care | Payer: Medicare PPO | Source: Ambulatory Visit | Attending: Nurse Practitioner | Admitting: Nurse Practitioner

## 2020-04-13 VITALS — BP 136/86 | HR 86 | Temp 97.7°F | Ht 62.99 in | Wt 159.4 lb

## 2020-04-13 DIAGNOSIS — Z01419 Encounter for gynecological examination (general) (routine) without abnormal findings: Secondary | ICD-10-CM | POA: Insufficient documentation

## 2020-04-13 DIAGNOSIS — F0150 Vascular dementia without behavioral disturbance: Secondary | ICD-10-CM | POA: Diagnosis not present

## 2020-04-13 DIAGNOSIS — I5022 Chronic systolic (congestive) heart failure: Secondary | ICD-10-CM | POA: Diagnosis not present

## 2020-04-13 DIAGNOSIS — R630 Anorexia: Secondary | ICD-10-CM | POA: Insufficient documentation

## 2020-04-13 DIAGNOSIS — I4891 Unspecified atrial fibrillation: Secondary | ICD-10-CM

## 2020-04-13 DIAGNOSIS — N939 Abnormal uterine and vaginal bleeding, unspecified: Secondary | ICD-10-CM | POA: Insufficient documentation

## 2020-04-13 DIAGNOSIS — Z1151 Encounter for screening for human papillomavirus (HPV): Secondary | ICD-10-CM | POA: Diagnosis not present

## 2020-04-13 DIAGNOSIS — I10 Essential (primary) hypertension: Secondary | ICD-10-CM

## 2020-04-13 DIAGNOSIS — R194 Change in bowel habit: Secondary | ICD-10-CM | POA: Insufficient documentation

## 2020-04-13 DIAGNOSIS — E7800 Pure hypercholesterolemia, unspecified: Secondary | ICD-10-CM

## 2020-04-13 DIAGNOSIS — R58 Hemorrhage, not elsewhere classified: Secondary | ICD-10-CM | POA: Insufficient documentation

## 2020-04-13 DIAGNOSIS — R8281 Pyuria: Secondary | ICD-10-CM

## 2020-04-13 DIAGNOSIS — R197 Diarrhea, unspecified: Secondary | ICD-10-CM

## 2020-04-13 DIAGNOSIS — R3121 Asymptomatic microscopic hematuria: Secondary | ICD-10-CM | POA: Insufficient documentation

## 2020-04-13 LAB — CBC WITH DIFFERENTIAL/PLATELET
Hematocrit: 44.2 % (ref 34.0–46.6)
Hemoglobin: 14.5 g/dL (ref 11.1–15.9)
Lymphocytes Absolute: 1.8 10*3/uL (ref 0.7–3.1)
Lymphs: 33 %
MCH: 30.2 pg (ref 26.6–33.0)
MCHC: 32.8 g/dL (ref 31.5–35.7)
MCV: 92 fL (ref 79–97)
MID (Absolute): 0.8 10*3/uL (ref 0.1–1.6)
MID: 9 %
Neutrophils Absolute: 3.2 10*3/uL (ref 1.4–7.0)
Neutrophils: 58 %
Platelets: 221 10*3/uL (ref 150–450)
RBC: 4.8 x10E6/uL (ref 3.77–5.28)
RDW: 14.1 % (ref 11.7–15.4)
WBC: 5.5 10*3/uL (ref 3.4–10.8)

## 2020-04-13 LAB — MICROSCOPIC EXAMINATION

## 2020-04-13 LAB — URINALYSIS, ROUTINE W REFLEX MICROSCOPIC
Bilirubin, UA: NEGATIVE
Glucose, UA: NEGATIVE
Ketones, UA: NEGATIVE
Nitrite, UA: NEGATIVE
Protein,UA: NEGATIVE
Specific Gravity, UA: 1.015 (ref 1.005–1.030)
Urobilinogen, Ur: 0.2 mg/dL (ref 0.2–1.0)
pH, UA: 7 (ref 5.0–7.5)

## 2020-04-13 MED ORDER — ROSUVASTATIN CALCIUM 10 MG PO TABS: 10.0000 mg | ORAL_TABLET | Freq: Every day | ORAL | 4 refills | Status: AC

## 2020-04-13 MED ORDER — TAMSULOSIN HCL 0.4 MG PO CAPS: 0.4000 mg | ORAL_CAPSULE | Freq: Every day | ORAL | 4 refills | Status: DC

## 2020-04-13 MED ORDER — DILTIAZEM HCL ER COATED BEADS 180 MG PO CP24: 180.0000 mg | ORAL_CAPSULE | Freq: Two times a day (BID) | ORAL | 4 refills | Status: AC

## 2020-04-13 MED ORDER — METOPROLOL SUCCINATE ER 25 MG PO TB24: 25.0000 mg | ORAL_TABLET | Freq: Every day | ORAL | 4 refills | Status: AC

## 2020-04-13 MED ORDER — APIXABAN 5 MG PO TABS: ORAL_TABLET | ORAL | 4 refills | Status: AC

## 2020-04-13 NOTE — Assessment & Plan Note (Signed)
Acute, ?whether more loose stool vs diarrhea.  No recent abx use.  Suspect some incontinence issues related to progressive dementia.  Will obtain labs today and obtain stat CT imaging pelvis and abdomen due to report of decreased meal intake recently and her personal history of cancer.  Plan on return in 2 weeks for follow-up, will determine next steps upon return of labs and imaging.

## 2020-04-13 NOTE — Assessment & Plan Note (Addendum)
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  Highly recommend follow-up with neurology.  Discussed at length CVA prevention goals, as noted above under HTN plan.  Attempted to involve CCM team in past, which they have declined multiple times.

## 2020-04-13 NOTE — Assessment & Plan Note (Signed)
Goal for stroke prevention is < 70.  Continue Crestor at 10 MG and adjust as needed.  Recent LDL at visit was 70.

## 2020-04-13 NOTE — Assessment & Plan Note (Signed)
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  Initial BP elevated today, but repeat below goal.  Will continue Diltiazem and Metoprolol at this time, however if elevations present would restart Lisinopril.  Have recommended they check BP at home daily and document. Continue collaboration with cardiology -- schedule follow-up ASAP.  Plan for follow-up in 2 weeks.

## 2020-04-13 NOTE — Progress Notes (Signed)
BP 136/86 (BP Location: Right Arm)   Pulse 86   Temp 97.7 F (36.5 C) (Oral)   Ht 5' 2.99" (1.6 m)   Wt 159 lb 6.4 oz (72.3 kg)   SpO2 99%   BMI 28.24 kg/m    Subjective:    Patient ID: Norma Hill, female    DOB: 1946-02-25, 75 y.o.   MRN: [id number]  HPI: Norma Hill is a 75 y.o. female  Chief Complaint  Patient presents with  . Medication Refill  . Diarrhea  . Vaginal Bleeding    Past few months, and husband states that her belly seems to be bloated   Her husband is at bedside to assist due to patient memory loss.  DIARRHEA Her husband reports this is present, she denies.  Has underlying dementia.  Using at least one roll large toilet paper.  Her husband reports this is loose stool, has had incidents of "messing in her pants".  Sometimes 3-8 accidents a day in pants.  Her husband does all the laundry.  She denies pain in abdomen.  Her husband reports her stomach is swollen, has been for weeks.  Her husband reports her appetite is not good, eating a few bites and then stopping.  She has not had colonoscopy since being married to husband, at least 69 years.  No known family history of colon cancer. Duration:months Alleviating factors: nothing Aggravating factors: unknown Status: fluctuating Treatments attempted: none Fever: no Nausea: no Vomiting: no Weight loss: no Decreased appetite: yes Diarrhea: yes Constipation: no Blood in stool: per her husband, a small amount Heartburn: no Jaundice: no Rash: no Dysuria/urinary frequency: no Hematuria: no History of sexually transmitted disease: no Recurrent NSAID use: no   VAGINAL BLEEDING Has been going on per her husband since she had her stroke there has been some bleeding, but over past 6 months he reports this is constant.  Is using Depends pads at home.  At times this bleeding is filling whole Depends pad, it varies in amount.  Has not had menstrual cycle in years, still has uterus.  Has two children,  both born vaginally.  Denies any pain in abdomen, but husband does report bloating of abdomen -- he has noticed this over past year -- worse over past two weeks and ankles have been swollen.  Patient reports "bleeding is not that bad and more when I wipe". Duration: months Pruritus: no Dysuria: no Malodorous: no Urinary frequency: no Fevers: no Abdominal pain: no  Sexual activity: not sexually active History of sexually transmitted diseases: none Recent antibiotic use: no Context: better  Treatments attempted: none   HYPERTENSION / HYPERLIPIDEMIA Currently taking Crestor, started in October 2020 after CVA, and Cardizem, Metoprolol, and Eliquis.  Most recent echo performed in September 2020 noting EF 40-45% with mildly decreased left ventricle function and mild LVH.   Satisfied with current treatment? yes Duration of hypertension: chronic BP monitoring frequency: not checking BP range:  BP medication side effects: no Duration of hyperlipidemia: chronic Cholesterol medication side effects: no Cholesterol supplements: none Medication compliance: good compliance Aspirin: yes Recent stressors: no Recurrent headaches: no Visual changes: no Palpitations: no Dyspnea: no Chest pain: no Lower extremity edema: no Dizzy/lightheaded: no   ATRIAL FIBRILLATION Followed by Dr. Dwyane Dee at [facility] cardiology, last seen 02/07/2018.  Has had no recent cardiology visit.  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  No history of smoking  or alcohol use.  She is unsure of family history of stroke.   Her husband reports concerns of ongoing memory loss, forgetting where she puts items and trouble finding clothes.  Is able to perform ADLs with minimal difficulty.  He reports that he is retired and able to assist her at home.  She does endorse memory changes since stroke.  Husband reports she can have behaviors, refusing to perform tasks or arguing with him.  Has had recent episodes of incontinence stool in bottoms, but none of urine per her husband.  Has had decreased appetite recently, over past month.  Did see neurology x one for initial visit on 03/17/2019, Dr. Melrose Nakayama, but has not returned.  Relevant past medical, surgical, family and social history reviewed and updated as indicated. Interim medical history since our last visit reviewed. Allergies and medications reviewed and updated.  Review of Systems  Constitutional: Positive for appetite change. Negative for activity change, diaphoresis, fatigue and fever.  Respiratory: Negative for cough, chest tightness and shortness of breath.   Cardiovascular: Negative for chest pain, palpitations and leg swelling.  Gastrointestinal: Positive for diarrhea. Negative for abdominal distention, abdominal pain, blood in stool, constipation, nausea and vomiting.  Endocrine: Negative for cold intolerance, heat intolerance, polydipsia, polyphagia and polyuria.  Genitourinary: Positive for vaginal bleeding. Negative for dysuria, flank pain, frequency, hematuria, urgency, vaginal discharge and vaginal pain.  Neurological: Negative.   Psychiatric/Behavioral: Negative.     Per HPI unless specifically indicated above     Objective:    BP 136/86 (BP Location: Right Arm)   Pulse 86   Temp 97.7 F (36.5 C) (Oral)   Ht 5' 2.99" (1.6 m)   Wt 159 lb 6.4 oz (72.3 kg)   SpO2 99%   BMI 28.24 kg/m   Wt Readings from Last 3 Encounters:  04/13/20 159 lb 6.4 oz (72.3 kg)  06/04/19 155 lb (70.3 kg)   12/24/18 150 lb (68 kg)    Physical Exam Vitals and nursing note reviewed. Exam conducted with a chaperone present.  Constitutional:      General: She is awake. She is not in acute distress.    Appearance: She is well-developed. She is not ill-appearing.  HENT:     Head: Normocephalic.     Right Ear: Hearing normal.     Left Ear: Hearing normal.  Eyes:     General: Lids are normal.        Right eye: No discharge.        Left eye: No discharge.     Extraocular Movements: Extraocular movements intact.     Conjunctiva/sclera: Conjunctivae normal.     Pupils: Pupils are equal, round, and reactive to light.     Visual Fields: Right eye visual fields normal and left eye visual fields normal.     Comments: No nystagmus noted.  Neck:     Thyroid: No thyromegaly.     Vascular: No carotid bruit.  Cardiovascular:     Rate and Rhythm: Normal rate. Rhythm irregularly irregular.     Heart sounds: Normal heart sounds. No murmur heard. No gallop.   Pulmonary:     Effort:  Pulmonary effort is normal. No accessory muscle usage or respiratory distress.     Breath sounds: Normal breath sounds.  Abdominal:     General: Bowel sounds are normal. There is no distension.     Palpations: Abdomen is soft. There is no hepatomegaly.     Tenderness: There is no abdominal tenderness. There is no right CVA tenderness or left CVA tenderness.     Hernia: There is no hernia in the left inguinal area or right inguinal area.     Comments: Abdomen soft with no discomfort reported.  No fluid wave.  Genitourinary:    Exam position: Lithotomy position.     Pubic Area: No rash.      Labia:        Right: No rash.      Urethra: No urethral pain.     Vagina: Normal.     Cervix: Normal.     Uterus: Normal.      Adnexa: Right adnexa normal and left adnexa normal.     Rectum: External hemorrhoid (mulitple noted with no active bleeding) present.     Comments: Pap sample obtained and sent.  Urethra with mild old sang  noted to exterior and irritation with mild injection.   Musculoskeletal:     Cervical back: Normal range of motion and neck supple.     Right lower leg: 1+ Edema present.     Left lower leg: 1+ Edema present.  Skin:    General: Skin is warm and dry.  Neurological:     Mental Status: She is alert.     Cranial Nerves: Cranial nerves are intact.     Gait: Gait is intact.     Deep Tendon Reflexes:     Reflex Scores:      Brachioradialis reflexes are 1+ on the right side and 1+ on the left side.      Patellar reflexes are 1+ on the right side and 1+ on the left side.    Comments: Not oriented to year, month, day of week, President.  Psychiatric:        Attention and Perception: Attention normal.        Mood and Affect: Mood normal.        Speech: Speech normal.        Behavior: Behavior normal. Behavior is cooperative.     Results for orders placed or performed in visit on 29/47/65  Basic Metabolic Panel (BMET)  Result Value Ref Range   Glucose 83 65 - 99 mg/dL   BUN 16 8 - 27 mg/dL   Creatinine, Ser 0.72 0.57 - 1.00 mg/dL   GFR calc non Af Amer 82 >59 mL/min/1.73   GFR calc Af Amer 95 >59 mL/min/1.73   BUN/Creatinine Ratio 22 12 - 28   Sodium 142 134 - 144 mmol/L   Potassium 3.8 3.5 - 5.2 mmol/L   Chloride 105 96 - 106 mmol/L   CO2 26 20 - 29 mmol/L   Calcium 9.8 8.7 - 10.3 mg/dL  CBC with Differential/Platelet out  Result Value Ref Range   WBC 6.7 3.4 - 10.8 x10E3/uL   RBC 4.42 3.77 - 5.28 x10E6/uL   Hemoglobin 13.4 11.1 - 15.9 g/dL   Hematocrit 41.2 34.0 - 46.6 %   MCV 93 79 - 97 fL   MCH 30.3 26.6 - 33.0 pg   MCHC 32.5 31.5 - 35.7 g/dL   RDW 13.4 11.7 - 15.4 %   Platelets 226 150 - 450 x10E3/uL  Neutrophils 61 Not Estab. %   Lymphs 28 Not Estab. %   Monocytes 9 Not Estab. %   Eos 1 Not Estab. %   Basos 1 Not Estab. %   Neutrophils Absolute 4.0 1.4 - 7.0 x10E3/uL   Lymphocytes Absolute 1.9 0.7 - 3.1 x10E3/uL   Monocytes Absolute 0.6 0.1 - 0.9 x10E3/uL   EOS  (ABSOLUTE) 0.1 0.0 - 0.4 x10E3/uL   Basophils Absolute 0.1 0.0 - 0.2 x10E3/uL   Immature Granulocytes 0 Not Estab. %   Immature Grans (Abs) 0.0 0.0 - 0.1 x10E3/uL  Lipid Panel w/o Chol/HDL Ratio  Result Value Ref Range   Cholesterol, Total 166 100 - 199 mg/dL   Triglycerides 60 0 - 149 mg/dL   HDL 84 >39 mg/dL   VLDL Cholesterol Cal 12 5 - 40 mg/dL   LDL Chol Calc (NIH) 70 0 - 99 mg/dL      Assessment & Plan:   Problem List Items Addressed This Visit      Cardiovascular and Mediastinum   Atrial fibrillation with RVR (HCC)    Chronic, ongoing.  Continue collaboration with [facility]ology at [facility] (reiterated need for them to scheduled appointment ASAP) and current medication regimen.  Rate control present, 80's apical.        Relevant Medications   diltiazem (CARDIZEM CD) 180 MG 24 hr capsule   apixaban (ELIQUIS) 5 MG TABS tablet   metoprolol succinate (TOPROL-XL) 25 MG 24 hr tablet   rosuvastatin (CRESTOR) 10 MG tablet   Essential hypertension    Chronic, ongoing with post-CVA goal <130/90.  Initial BP elevated today, but repeat below goal.  Will continue Diltiazem and Metoprolol at this time, however if elevations present would restart Lisinopril.  Have recommended they check BP at home daily and document. Continue collaboration with cardiology -- schedule follow-up ASAP.  Plan for follow-up in 2 weeks.      Relevant Medications   diltiazem (CARDIZEM CD) 180 MG 24 hr capsule   apixaban (ELIQUIS) 5 MG TABS tablet   metoprolol succinate (TOPROL-XL) 25 MG 24 hr tablet   rosuvastatin (CRESTOR) 10 MG tablet   Systolic heart failure (HCC) - Primary    Chronic, ongoing.  EF reduced on most recent echo.  Continue collaboration with [facility]ology at [facility], have reiterated need for husband to schedule this ASAP.  Continue current medication regimen and adjust as needed, including Metoprolol -- refills sent in.  Adjust regimen as needed and monitor for acute exacerbations.  Obtain BNP today, as mild  increased edema in BLE.  May consider addition of low dose Lasix as needed for short period. - Reminded to call for an overnight weight gain of >2 pounds or a weekly weight weight of >5 pounds - not adding salt to his food and has been reading food labels. Reviewed the importance of keeping daily sodium intake to 2000mg  daily       Relevant Medications   diltiazem (CARDIZEM CD) 180 MG 24 hr capsule   apixaban (ELIQUIS) 5 MG TABS tablet   metoprolol succinate (TOPROL-XL) 25 MG 24 hr tablet   rosuvastatin (CRESTOR) 10 MG tablet   Other Relevant Orders   B Nat Peptide     Nervous and Auditory   Dementia ([place])    Suspect vascular in nature due to recent CVA.  Highly recommend follow-up with neurology.  Discussed at length CVA prevention goals, as noted above under HTN plan.  Attempted to involve CCM team in past, which they have declined multiple times.  Genitourinary   Asymptomatic microscopic hematuria    Mild irritation noted to urethra and UA noting 2+ BLD, trace LEUK, , few bacteria, 3-10 RBC.  CBC in house with H/H 14.5/44.2, MCV 92.1 -- no anemia.  ?whether bleeding more urine related or related to hemorrhoids noted on exam.  Due to concerns about decreased appetite per husband + diarrhea, and patient history of CA, will place CT Stat imaging for pelvis and abdomen to further assess.  Send urine for culture.  Treat hemorrhoids with OTC cream and recommend Colace daily to avoid straining.  Will determine next steps after remainder of labs and imaging returned.  Return in 2 weeks, sooner if worsening.       Relevant Orders   CT ABDOMEN PELVIS W WO CONTRAST   B Nat Peptide     Other   Elevated LDL cholesterol level    Goal for stroke prevention is < 70.  Continue Crestor at 10 MG and adjust as needed.  Recent LDL at visit was 70.       Relevant Orders   Lipid Panel w/o Chol/HDL Ratio   Vaginal bleeding    ?vaginal bleeding, none noted on exam today.  Pap obtained and sent.   Mild irritation noted to urethra and UA noting 2+ BLD, trace LEUK, , few bacteria, 3-10 RBC.  CBC in house with H/H 14.5/44.2, MCV 92.1 -- no anemia.  ?whether bleeding more urine related or related to hemorrhoids noted on exam.  Due to concerns about decreased appetite per husband + diarrhea, and patient history of CA, will place CT Stat imaging for pelvis and abdomen to further assess.  Send urine for culture.  Treat hemorrhoids with OTC cream and recommend Colace daily to avoid straining.  Will determine next steps after remainder of labs and imaging returned.  Return in 2 weeks, sooner if worsening.      Relevant Orders   Urinalysis, Routine w reflex microscopic   CBC With Differential/Platelet   Comprehensive metabolic panel   TSH   Iron, TIBC and Ferritin Panel   CT ABDOMEN PELVIS W WO CONTRAST   Cytology - PAP   Diarrhea    Acute, ?whether more loose stool vs diarrhea.  No recent abx use.  Suspect some incontinence issues related to progressive dementia.  Will obtain labs today and obtain stat CT imaging pelvis and abdomen due to report of decreased meal intake recently and her personal history of cancer.  Plan on return in 2 weeks for follow-up, will determine next steps upon return of labs and imaging.      Relevant Orders   Urinalysis, Routine w reflex microscopic   Comprehensive metabolic panel   TSH   CT ABDOMEN PELVIS W WO CONTRAST   Decreased appetite    Maintaining weight at this time.  Obtain stat CT imaging abdomen and pelvis due to history of cancer and current husband's report of decline in meal intake + ?vaginal bleeding and diarrhea.  UA noting hematuria 2+.       Other Visit Diagnoses    Pyuria       Urine for culture   Relevant Orders   Urine Culture      Time: 25+minutes, >50% spent counseling/or care coordination with husband and patient  Follow up plan: Return in about 2 weeks (around 04/27/2020) for Weight check, diarrhea, bleeding.

## 2020-04-13 NOTE — Patient Instructions (Signed)
Urinary Tract Infection, Adult A urinary tract infection (UTI) is an infection of any part of the urinary tract. The urinary tract includes:  The kidneys.  The ureters.  The bladder.  The urethra. These organs make, store, and get rid of pee (urine) in the body. What are the causes? This infection is caused by germs (bacteria) in your genital area. These germs grow and cause swelling (inflammation) of your urinary tract. What increases the risk? The following factors may make you more likely to develop this condition:  Using a small, thin tube (catheter) to drain pee.  Not being able to control when you pee or poop (incontinence).  Being female. If you are female, these things can increase the risk: ? Using these methods to prevent pregnancy:  A medicine that kills sperm (spermicide).  A device that blocks sperm (diaphragm). ? Having low levels of a female hormone (estrogen). ? Being pregnant. You are more likely to develop this condition if:  You have genes that add to your risk.  You are sexually active.  You take antibiotic medicines.  You have trouble peeing because of: ? A prostate that is bigger than normal, if you are female. ? A blockage in the part of your body that drains pee from the bladder. ? A kidney stone. ? A nerve condition that affects your bladder. ? Not getting enough to drink. ? Not peeing often enough.  You have other conditions, such as: ? Diabetes. ? A weak disease-fighting system (immune system). ? Sickle cell disease. ? Gout. ? Injury of the spine. What are the signs or symptoms? Symptoms of this condition include:  Needing to pee right away.  Peeing small amounts often.  Pain or burning when peeing.  Blood in the pee.  Pee that smells bad or not like normal.  Trouble peeing.  Pee that is cloudy.  Fluid coming from the vagina, if you are female.  Pain in the belly or lower back. Other symptoms include:  Vomiting.  Not  feeling hungry.  Feeling mixed up (confused). This may be the first symptom in older adults.  Being tired and grouchy (irritable).  A fever.  Watery poop (diarrhea). How is this treated?  Taking antibiotic medicine.  Taking other medicines.  Drinking enough water. In some cases, you may need to see a specialist. Follow these instructions at home: Medicines  Take over-the-counter and prescription medicines only as told by your doctor.  If you were prescribed an antibiotic medicine, take it as told by your doctor. Do not stop taking it even if you start to feel better. General instructions  Make sure you: ? Pee until your bladder is empty. ? Do not hold pee for a long time. ? Empty your bladder after sex. ? Wipe from front to back after peeing or pooping if you are a female. Use each tissue one time when you wipe.  Drink enough fluid to keep your pee pale yellow.  Keep all follow-up visits.   Contact a doctor if:  You do not get better after 1-2 days.  Your symptoms go away and then come back. Get help right away if:  You have very bad back pain.  You have very bad pain in your lower belly.  You have a fever.  You have chills.  You feeling like you will vomit or you vomit. Summary  A urinary tract infection (UTI) is an infection of any part of the urinary tract.  This condition is caused by   germs in your genital area.  There are many risk factors for a UTI.  Treatment includes antibiotic medicines.  Drink enough fluid to keep your pee pale yellow. This information is not intended to replace advice given to you by your health care provider. Make sure you discuss any questions you have with your health care provider. Document Revised: 10/03/2019 Document Reviewed: 10/03/2019 Elsevier Patient Education  2021 [employer].  

## 2020-04-13 NOTE — Assessment & Plan Note (Addendum)
Mild irritation noted to urethra and UA noting 2+ BLD, trace LEUK, , few bacteria, 3-10 RBC.  CBC in house with H/H 14.5/44.2, MCV 92.1 -- no anemia.  ?whether bleeding more urine related or related to hemorrhoids noted on exam.  Due to concerns about decreased appetite per husband + diarrhea, and patient history of CA, will place CT Stat imaging for pelvis and abdomen to further assess.  Send urine for culture.  Treat hemorrhoids with OTC cream and recommend Colace daily to avoid straining.  Will determine next steps after remainder of labs and imaging returned.  Return in 2 weeks, sooner if worsening.

## 2020-04-13 NOTE — Assessment & Plan Note (Addendum)
Chronic, ongoing.  EF reduced on most recent echo.  Continue collaboration with cardiology at [facility], have reiterated need for husband to schedule this ASAP.  Continue current medication regimen and adjust as needed, including Metoprolol -- refills sent in.  Adjust regimen as needed and monitor for acute exacerbations.  Obtain BNP today, as mild increased edema in BLE.  May consider addition of low dose Lasix as needed for short period. - Reminded to call for an overnight weight gain of >2 pounds or a weekly weight weight of >5 pounds - not adding salt to his food and has been reading food labels. Reviewed the importance of keeping daily sodium intake to 2000mg  daily

## 2020-04-13 NOTE — Assessment & Plan Note (Signed)
Chronic, ongoing.  Continue collaboration with cardiology at [facility] (reiterated need for them to scheduled appointment ASAP) and current medication regimen.  Rate control present, 80's apical.

## 2020-04-13 NOTE — Assessment & Plan Note (Signed)
?  vaginal bleeding, none noted on exam today.  Pap obtained and sent.  Mild irritation noted to urethra and UA noting 2+ BLD, trace LEUK, , few bacteria, 3-10 RBC.  CBC in house with H/H 14.5/44.2, MCV 92.1 -- no anemia.  ?whether bleeding more urine related or related to hemorrhoids noted on exam.  Due to concerns about decreased appetite per husband + diarrhea, and patient history of CA, will place CT Stat imaging for pelvis and abdomen to further assess.  Send urine for culture.  Treat hemorrhoids with OTC cream and recommend Colace daily to avoid straining.  Will determine next steps after remainder of labs and imaging returned.  Return in 2 weeks, sooner if worsening.

## 2020-04-13 NOTE — Assessment & Plan Note (Signed)
Maintaining weight at this time.  Obtain stat CT imaging abdomen and pelvis due to history of cancer and current husband's report of decline in meal intake + ?vaginal bleeding and diarrhea.  UA noting hematuria 2+.

## 2020-04-14 ENCOUNTER — Ambulatory Visit
Admission: RE | Admit: 2020-04-14 | Discharge: 2020-04-14 | Disposition: A | Discharge status: Home / Self Care | Payer: Medicare PPO | Source: Ambulatory Visit | Attending: Nurse Practitioner | Admitting: Nurse Practitioner

## 2020-04-14 ENCOUNTER — Other Ambulatory Visit: Payer: Self-pay

## 2020-04-14 DIAGNOSIS — R197 Diarrhea, unspecified: Secondary | ICD-10-CM | POA: Diagnosis present

## 2020-04-14 DIAGNOSIS — N939 Abnormal uterine and vaginal bleeding, unspecified: Secondary | ICD-10-CM | POA: Diagnosis not present

## 2020-04-14 DIAGNOSIS — R3121 Asymptomatic microscopic hematuria: Secondary | ICD-10-CM | POA: Insufficient documentation

## 2020-04-14 LAB — COMPREHENSIVE METABOLIC PANEL
ALT: 15 IU/L (ref 0–32)
AST: 17 IU/L (ref 0–40)
Albumin/Globulin Ratio: 2 (ref 1.2–2.2)
Albumin: 4.7 g/dL (ref 3.7–4.7)
Alkaline Phosphatase: 168 IU/L — ABNORMAL HIGH (ref 44–121)
BUN/Creatinine Ratio: 9 — ABNORMAL LOW (ref 12–28)
BUN: 7 mg/dL — ABNORMAL LOW (ref 8–27)
Bilirubin Total: 0.5 mg/dL (ref 0.0–1.2)
CO2: 21 mmol/L (ref 20–29)
Calcium: 9.6 mg/dL (ref 8.7–10.3)
Chloride: 104 mmol/L (ref 96–106)
Creatinine, Ser: 0.74 mg/dL (ref 0.57–1.00)
GFR calc Af Amer: 91 mL/min/{1.73_m2} (ref >59)
GFR calc non Af Amer: 79 mL/min/{1.73_m2} (ref >59)
Globulin, Total: 2.3 g/dL (ref 1.5–4.5)
Glucose: 85 mg/dL (ref 65–99)
Potassium: 3.4 mmol/L — ABNORMAL LOW (ref 3.5–5.2)
Sodium: 143 mmol/L (ref 134–144)
Total Protein: 7 g/dL (ref 6.0–8.5)

## 2020-04-14 LAB — LIPID PANEL W/O CHOL/HDL RATIO
Cholesterol, Total: 176 mg/dL (ref 100–199)
HDL: 89 mg/dL (ref >39)
LDL Chol Calc (NIH): 75 mg/dL (ref 0–99)
Triglycerides: 63 mg/dL (ref 0–149)
VLDL Cholesterol Cal: 12 mg/dL (ref 5–40)

## 2020-04-14 LAB — IRON,TIBC AND FERRITIN PANEL
Ferritin: 24 ng/mL (ref 15–150)
Iron Saturation: 16 % (ref 15–55)
Iron: 66 ug/dL (ref 27–139)
Total Iron Binding Capacity: 403 ug/dL (ref 250–450)
UIBC: 337 ug/dL (ref 118–369)

## 2020-04-14 LAB — BRAIN NATRIURETIC PEPTIDE: BNP: 132.3 pg/mL — ABNORMAL HIGH (ref 0.0–100.0)

## 2020-04-14 LAB — TSH: TSH: 1.12 u[IU]/mL (ref 0.450–4.500)

## 2020-04-14 MED ORDER — IOHEXOL 300 MG/ML  SOLN
100.0000 mL | Freq: Once | INTRAMUSCULAR | Status: AC | PRN
  Administered 2020-04-14: 100 mL via INTRAVENOUS

## 2020-04-15 LAB — CYTOLOGY - PAP
Comment: NEGATIVE
Diagnosis: NEGATIVE
High risk HPV: NEGATIVE

## 2020-04-15 LAB — URINE CULTURE

## 2020-04-15 NOTE — Progress Notes (Signed)
Have attempted to call husband cell and home phone multiple times and left messages.  Please attempt to call and alert of following: - CT scan was overall normal, no acute findings to determine cause of bleeding reported.  No masses noted. - Her kidney function is stable.  Potassium is slightly low.  I recommend adding more potassium rich foods into diet, like potatoes, bananas, mangoes. - Urine for negative for infection and pap smear returned negative for malignancy.  It did show some atrophy, but this is common in women past menopause and not abnormal to see on a pap in a women in their 22's. - Her iron level was normal, thyroid normal.  Cholesterol levels at goal.   - BNP which looks at her heart failure was mildly elevated, this may explain some of the swelling she gets at times.  We may need to add a little diuretic at next visit to help with this, but we will discuss first. - I suspect her bleeding may have come from external hemorrhoids I noted on exam recently.  She is on Apixaban, so if straining or having frequent bowel movements these could be irritated.  I would recommend some over the counter hemorrhoid cream.  If any increased bleeding immediately notify me or go to ER.  I will see you all at follow-up.  Any questions? Keep being awesome!!  Thank you for allowing me to participate in your care. Kindest regards, Donnovan Stamour

## 2020-04-26 ENCOUNTER — Telehealth: Payer: Self-pay

## 2020-04-26 NOTE — Telephone Encounter (Signed)
Noted, will review at visit and assess -- recent work-up normal.

## 2020-04-26 NOTE — Telephone Encounter (Signed)
Pt's husband Norma Hill presented in office to alert Jolene that pt is still having bleeding, he states that he can tell because he washes her clothes. He states that she is getting worse, she can't even turn on the tv. Pt states that he wanted Jolene to know because he knows that the pt will act like everything is fine and he doesn't want to say this in front of the pt. Pt has an upcoming appt.

## 2020-04-27 ENCOUNTER — Other Ambulatory Visit: Payer: Self-pay

## 2020-04-27 ENCOUNTER — Encounter: Payer: Self-pay | Admitting: Nurse Practitioner

## 2020-04-27 ENCOUNTER — Other Ambulatory Visit: Payer: Self-pay | Admitting: Nurse Practitioner

## 2020-04-27 ENCOUNTER — Telehealth: Payer: Self-pay

## 2020-04-27 ENCOUNTER — Ambulatory Visit: Payer: Medicare PPO | Admitting: Nurse Practitioner

## 2020-04-27 VITALS — BP 128/80 | HR 97 | Temp 97.6°F | Wt 161.4 lb

## 2020-04-27 DIAGNOSIS — I10 Essential (primary) hypertension: Secondary | ICD-10-CM | POA: Diagnosis not present

## 2020-04-27 DIAGNOSIS — I4891 Unspecified atrial fibrillation: Secondary | ICD-10-CM | POA: Diagnosis not present

## 2020-04-27 DIAGNOSIS — I5022 Chronic systolic (congestive) heart failure: Secondary | ICD-10-CM

## 2020-04-27 DIAGNOSIS — F0150 Vascular dementia without behavioral disturbance: Secondary | ICD-10-CM

## 2020-04-27 DIAGNOSIS — R194 Change in bowel habit: Secondary | ICD-10-CM

## 2020-04-27 DIAGNOSIS — R58 Hemorrhage, not elsewhere classified: Secondary | ICD-10-CM

## 2020-04-27 DIAGNOSIS — Z8673 Personal history of transient ischemic attack (TIA), and cerebral infarction without residual deficits: Secondary | ICD-10-CM

## 2020-04-27 NOTE — Assessment & Plan Note (Signed)
Chronic, ongoing.  EF reduced on most recent echo.  Continue collaboration with cardiology at [facility], have reiterated need for husband to schedule this ASAP -- new referral placed.  Continue current medication regimen and adjust as needed, including Metoprolol.  Adjust regimen as needed and monitor for acute exacerbations.  Obtain BNP today as recent mild elevation.  May consider addition of low dose Lasix as needed for short period, although recent K+ 3.4 -- recheck CMP today. - Reminded to call for an overnight weight gain of >2 pounds or a weekly weight weight of >5 pounds - not adding salt to his food and has been reading food labels. Reviewed the importance of keeping daily sodium intake to 2000mg  daily

## 2020-04-27 NOTE — Telephone Encounter (Signed)
Pt was called X3 to come by the office to redraw blood it pt calls back let them know we need more blood work for labs

## 2020-04-27 NOTE — Assessment & Plan Note (Signed)
Ongoing, ?constipation although patient denies straining (poor historian with dementia).  Recent CT reassuring.  Due to frequency and recent episodes of blood in toilet will refer to GI for further assessment.  Patient on DOAC for A-fib, but recent H/H and iron levels stable -- will recheck today.  Return in 4 weeks, sooner if worsening.

## 2020-04-27 NOTE — Assessment & Plan Note (Signed)
Ongoing per husband report, but patient denies issues (dementia).  Suspect this is rectal in nature from hemorrhoids -- is on DOAC treatment due to a-fib.  Recent labs showed stable H/H and iron -- recheck today.  Obtain FOBT, sent home with husband.  Referral to GI placed and discussed with patient and husband.  Due to frequency of BM and blood in toilet -- suspect more hemorrhoid related.  Return in 4 weeks, sooner if worsening symptoms.

## 2020-04-27 NOTE — Patient Instructions (Signed)
Bleeding Precautions When on Anticoagulant Therapy, Adult Anticoagulant therapy, also called blood thinner therapy, is medicine that helps to prevent and treat blood clots. The medicine works by stopping blood clots from forming or growing. Blood clots that form in your blood vessels can be dangerous. They can break loose and travel to the heart, lungs, or brain. This increases the risk of a heart attack, stroke, or blocked lung artery (pulmonary embolism). Anticoagulants also increase the risk of bleeding. Try to protect yourself from cuts and other injuries that can cause bleeding. It is important to take anticoagulants exactly as told by your health care provider. Why do I need to be on anticoagulant therapy? You may need this medicine if you are at risk of developing a blood clot. Conditions that increase your risk of a blood clot include:  Being born with heart disease or a heart malformation (congenital heart disease).  Developing heart disease.  Having had surgery, such as valve replacement.  Having had a serious accident or other type of severe injury (trauma).  Having certain types of cancer.  Having certain diseases that can increase blood clotting.  Having a high risk of stroke or heart attack.  Having atrial fibrillation (AF). What are the common anticoagulant medicines? There are several types of anticoagulant medicines. The most common types are:  Medicines that you take by mouth (oral medicines), such as: ? Warfarin. ? Novel oral anticoagulants (NOACs), such as:  Direct thrombin inhibitors (dabigatran).  Factor Xa inhibitors (apixaban, edoxaban, and rivaroxaban).  Injections, such as: ? Unfractionated heparin. ? Low molecular weight heparin. These anticoagulants work in different ways to prevent blood clots. They also have different risks and side effects. What do I need to remember while on anticoagulant therapy? Taking anticoagulants  Take your medicine at the  same time every day. If you forget to take your medicine, take it as soon as you remember. Do not double your dosage of medicine if you miss a whole day. Take your normal dose and call your health care provider.  Do not stop taking your medicine unless your health care provider approves. Stopping the medicine can increase your risk of developing a blood clot. Taking other medicines  Take over-the-counter and prescriptions medicines only as told by your health care provider.  Do not take over-the-counter NSAIDs, including aspirin and ibuprofen, while you are on anticoagulant therapy. These medicines increase your risk of dangerous bleeding.  Get approval from your health care provider before you start taking any new medicines, vitamins, or herbal products. Some of these could interfere with your therapy. General instructions  Keep all follow-up visits as told by your health care provider. This is important.  If you are pregnant or trying to get pregnant, talk with a health care provider about anticoagulants. Some of these medicines are not safe to take during pregnancy.  Tell all health care providers, including your dentist, that you are on anticoagulant therapy. It is especially important to tell providers before you have any surgery, medical procedures, or dental work done. What precautions should I take?  Be very careful when using knives, scissors, or other sharp objects.  Use an electric razor instead of a blade.  Do not use toothpicks.  Use a soft-bristled toothbrush. Brush your teeth gently.  Always wear shoes outdoors and wear slippers indoors.  Be careful when cutting your fingernails and toenails.  Place bath mats in the bathroom. If possible, install handrails as well.  Wear gloves while you do yard  work.  Lobbyist.  Prevent falls by removing loose rugs and extension cords from areas where you walk. Use a cane or walker if you need it.  Avoid constipation  by: ? Drinking enough fluid to keep your urine clear or pale yellow. ? Eating foods that are high in fiber, such as fresh fruits and vegetables, whole grains, and beans. ? Limiting foods that are high in fat and processed sugars, such as fried and sweet foods.  Do not play contact sports or participate in other activities that have a high risk for injury.   What other precautions are important if on warfarin therapy? If you are taking a type of anticoagulant called warfarin, make sure you:  Work with a diet and nutrition specialist (dietitian) to make an eating plan. Do not make any sudden changes to your diet after you have started your eating plan.  Do not drink alcohol. It can interfere with your medicine and increase your risk of an injury that causes bleeding.  Get regular blood tests as told by your health care provider.   What are some questions to ask my health care provider?  Why do I need anticoagulant therapy?  What is the best anticoagulant therapy for my condition?  How long will I need anticoagulant therapy?  What are the side effects of anticoagulant therapy?  When should I take my medicine? What should I do if I forget to take it?  Will I need to have regular blood tests?  Do I need to change my diet? Are there foods or drinks that I should avoid?  What activities are safe for me?  What should I do if I want to get pregnant? Contact a health care provider if:  You miss a dose of medicine: ? And you are not sure what to do. ? For more than one day.  You have: ? Menstrual bleeding that is heavier than normal. ? Bloody or brown urine. ? Easy bruising. ? Black and tarry stool or bright red stool. ? Side effects from your medicine.  You feel weak or dizzy.  You become pregnant. Get help right away if:  You have bleeding that will not stop within 20 minutes from: ? The nose. ? The gums. ? A cut on the skin.  You have a severe headache or  stomachache.  You vomit or cough up blood.  You fall or hit your head. Summary  Anticoagulant therapy, also called blood thinner therapy, is medicine that helps to prevent and treat blood clots.  Anticoagulants work in different ways to prevent blood clots. They also have different risks and side effects.  Talk with your health care provider about any precautions that you should take while on anticoagulant therapy. This information is not intended to replace advice given to you by your health care provider. Make sure you discuss any questions you have with your health care provider. Document Revised: 07/28/2019 Document Reviewed: 07/28/2019 Elsevier Patient Education  [place].

## 2020-04-27 NOTE — Assessment & Plan Note (Signed)
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  Highly recommend follow-up with neurology.  Discussed at length CVA prevention goals, as noted above under HTN plan.  Attempted to involve CCM team in past, which they have declined multiple times.

## 2020-04-27 NOTE — Assessment & Plan Note (Signed)
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  Initial BP elevated today, but repeat below goal.  Will continue Diltiazem and Metoprolol at this time, however if elevations present would restart Lisinopril.  Have recommended they check BP at home daily and document. Continue collaboration with cardiology -- schedule follow-up ASAP -- new referral in place.  Plan for follow-up in 4 weeks.

## 2020-04-27 NOTE — Assessment & Plan Note (Signed)
Chronic, ongoing.  Continue collaboration with cardiology at [facility] (reiterated need for them to scheduled appointment ASAP -- new referral placed) and current medication regimen.  Rate control present, 90's apical.

## 2020-04-27 NOTE — Assessment & Plan Note (Signed)
Recent in October 2020.  Continue collaboration with neurology and cardiology.  Discussed at length CVA prevention goals: BP <130/90, A1C < 6.5%, and LDL <70.

## 2020-04-27 NOTE — Progress Notes (Signed)
BP 128/80 (BP Location: Left Arm)   Pulse 97   Temp 97.6 F (36.4 C) (Oral)   Wt 161 lb 6.4 oz (73.2 kg)   SpO2 98%   BMI 28.60 kg/m    Subjective:    Patient ID: Norma Hill, female    DOB: 1945/06/11, 75 y.o.   MRN: [id number]  HPI: Norma Hill is a 75 y.o. female  Chief Complaint  Patient presents with  . Follow-up    Pt states she has been dealing with bleeding for a while and her husband states it is a decent amount of bleeding.    Her husband is at bedside to assist due to patient memory loss.  FREQUENT BM Her husband reports this is present, she denies.  Has underlying dementia.  Using at least one roll large toilet paper daily.  Her husband reports this is loose stool, has had incidents of "messing in her pants".  Sometimes 3-8 accidents a day in pants.  Her husband does all the laundry.  She denies pain in abdomen.  Her husband reports her appetite is not good, eating a few bites and then stopping.  She has not had colonoscopy since being married to husband, at least 14 years.  No known family history of colon cancer. Duration:months Alleviating factors: nothing Aggravating factors: unknown Status: fluctuating Treatments attempted: none Fever: no Nausea: no Vomiting: no Weight loss: no Decreased appetite: yes Diarrhea: yes Constipation: no Blood in stool: per her husband, a small amount Heartburn: no Jaundice: no Rash: no Dysuria/urinary frequency: no Hematuria: no History of sexually transmitted disease: no Recurrent NSAID use: no   BLEEDING Has been going on per her husband since she had her stroke there has been some bleeding, but over past 6 months he reports this is constant.  Is using Depends pads at home.  At times this bleeding is filling whole Depends pad, it varies in amount.  Has not had menstrual cycle in years, still has uterus.  Has two children, both born vaginally.  Patient denies history of cancer in family.  Denies straining  with BM.    Recent labs noted no anemia, iron level in 60 range and CT abd/pelvis noted calcified fibroid, but not masses. Trace blood noted in urine, but culture negative. Pap showed vaginal atrophy but negative for malignancy and HPV. Duration: months Pruritus: no Dysuria: no Malodorous: no Urinary frequency: no Fevers: no Abdominal pain: no  Sexual activity: not sexually active History of sexually transmitted diseases: none Recent antibiotic use: no Context: better  Treatments attempted: none   HYPERTENSION / HYPERLIPIDEMIA Currently taking Crestor, started in October 2020 after CVA, and Cardizem, Metoprolol, and Eliquis.  Most recent echo performed in September 2020 noting EF 40-45% with mildly decreased left ventricle function and mild LVH.   Satisfied with current treatment? yes Duration of hypertension: chronic BP monitoring frequency: not checking BP range:  BP medication side effects: no Duration of hyperlipidemia: chronic Cholesterol medication side effects: no Cholesterol supplements: none Medication compliance: good compliance Aspirin: yes Recent stressors: no Recurrent headaches: no Visual changes: no Palpitations: no Dyspnea: no Chest pain: no Lower extremity edema: no Dizzy/lightheaded: no   ATRIAL FIBRILLATION Followed by Dr. Dwyane Dee at [facility] cardiology, last seen 02/07/2018.  Has had no recent cardiology visit.  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  No history of smoking  or alcohol use.  She is unsure of family history of stroke.   Her husband reports concerns of ongoing memory loss, forgetting where she puts items and trouble finding clothes.  Is able to perform ADLs with minimal difficulty.  He reports that he is retired and able to assist her at home.  She does endorse memory changes since stroke.  Husband reports she can have behaviors, refusing to perform tasks or arguing with him -- this is present in office today.  Has had recent episodes of incontinence stool in bottoms, but none of urine per her husband.  Eating meals well -- 3 meals a day. Did see neurology x one for initial visit on 03/17/2019, Dr. Melrose Nakayama, but has not returned.  Relevant past medical, surgical, family and social history reviewed and updated as indicated. Interim medical history since our last visit reviewed. Allergies and medications reviewed and updated.  Review of Systems  Constitutional: Negative for activity change, appetite change, diaphoresis, fatigue and fever.  Respiratory: Negative for cough, chest tightness and shortness of breath.   Cardiovascular: Negative for chest pain, palpitations and leg swelling.  Gastrointestinal: Positive for anal bleeding (noted in toilet after BM). Negative for abdominal distention, abdominal pain, blood in stool, constipation, diarrhea, nausea and vomiting.  Endocrine: Negative for cold intolerance, heat intolerance, polydipsia, polyphagia and polyuria.  Genitourinary: Negative.   Neurological: Negative.   Psychiatric/Behavioral: Negative.     Per HPI unless specifically indicated above     Objective:    BP 128/80 (BP Location: Left Arm)   Pulse 97   Temp 97.6 F (36.4 C) (Oral)   Wt 161 lb 6.4 oz (73.2 kg)   SpO2 98%   BMI 28.60 kg/m   Wt Readings from Last 3 Encounters:  04/27/20 161 lb 6.4 oz (73.2 kg)  04/13/20 159 lb 6.4 oz (72.3 kg)  06/04/19 155 lb (70.3 kg)    Physical Exam Vitals and nursing note reviewed. Exam conducted  with a chaperone present.  Constitutional:      General: She is awake. She is not in acute distress.    Appearance: She is well-developed. She is not ill-appearing.  HENT:     Head: Normocephalic.     Right Ear: Hearing normal.     Left Ear: Hearing normal.  Eyes:     General: Lids are normal.        Right eye: No discharge.        Left eye: No discharge.     Extraocular Movements: Extraocular movements intact.     Conjunctiva/sclera: Conjunctivae normal.     Pupils: Pupils are equal, round, and reactive to light.     Visual Fields: Right eye visual fields normal and left eye visual fields normal.     Comments: No nystagmus noted.  Neck:     Thyroid: No thyromegaly.     Vascular: No carotid bruit.  Cardiovascular:     Rate and Rhythm: Normal rate. Rhythm irregularly irregular.     Heart sounds: Normal heart sounds. No murmur heard. No gallop.   Pulmonary:     Effort: Pulmonary effort is normal. No accessory muscle usage or respiratory distress.  Breath sounds: Normal breath sounds.  Abdominal:     General: Bowel sounds are normal. There is no distension.     Palpations: Abdomen is soft. There is no hepatomegaly.     Tenderness: There is no abdominal tenderness. There is no right CVA tenderness or left CVA tenderness.     Hernia: There is no hernia in the left inguinal area or right inguinal area.     Comments: Abdomen soft with no discomfort reported.  No fluid wave.  Genitourinary:    Exam position: Lithotomy position.     Pubic Area: No rash.      Labia:        Right: No rash.      Urethra: No urethral pain.     Vagina: Normal.     Cervix: Normal.     Uterus: Normal.      Adnexa: Right adnexa normal and left adnexa normal.     Rectum: External hemorrhoid (mulitiple noted with no active bleeding last visit) present.     Comments:  Deferred exam today, per patient request -- exam performed recent visit. Musculoskeletal:     Cervical back: Normal range of motion and  neck supple.     Right lower leg: 1+ Edema present.     Left lower leg: 1+ Edema present.  Skin:    General: Skin is warm and dry.  Neurological:     Mental Status: She is alert.     Cranial Nerves: Cranial nerves are intact.     Gait: Gait is intact.     Deep Tendon Reflexes:     Reflex Scores:      Brachioradialis reflexes are 1+ on the right side and 1+ on the left side.      Patellar reflexes are 1+ on the right side and 1+ on the left side.    Comments: Not oriented to year, month, day of week, President.  Psychiatric:        Attention and Perception: Attention normal.        Mood and Affect: Mood normal.        Speech: Speech normal.        Behavior: Behavior normal. Behavior is cooperative.     Results for orders placed or performed in visit on 04/13/20  Urine Culture   Specimen: Urine   UR  Result Value Ref Range   Urine Culture, Routine Final report    Organism ID, Bacteria Comment   Microscopic Examination   Urine  Result Value Ref Range   WBC, UA 0-5 0 - 5 /hpf   RBC 3-10 (A) 0 - 2 /hpf   Epithelial Cells (non renal) 0-10 0 - 10 /hpf   Bacteria, UA Few (A) None seen/Few  Urinalysis, Routine w reflex microscopic  Result Value Ref Range   Specific Gravity, UA 1.015 1.005 - 1.030   pH, UA 7.0 5.0 - 7.5   Color, UA Yellow Yellow   Appearance Ur Clear Clear   Leukocytes,UA Trace (A) Negative   Protein,UA Negative Negative/Trace   Glucose, UA Negative Negative   Ketones, UA Negative Negative   RBC, UA 2+ (A) Negative   Bilirubin, UA Negative Negative   Urobilinogen, Ur 0.2 0.2 - 1.0 mg/dL   Nitrite, UA Negative Negative   Microscopic Examination See below:   CBC With Differential/Platelet  Result Value Ref Range   WBC 5.5 3.4 - 10.8 x10E3/uL   RBC 4.80 3.77 - 5.28 x10E6/uL   Hemoglobin 14.5 11.1 -  15.9 g/dL   Hematocrit 44.2 34.0 - 46.6 %   MCV 92 79 - 97 fL   MCH 30.2 26.6 - 33.0 pg   MCHC 32.8 31.5 - 35.7 g/dL   RDW 14.1 11.7 - 15.4 %   Platelets  221 150 - 450 x10E3/uL   Neutrophils 58 Not Estab. %   Lymphs 33 Not Estab. %   MID 9 Not Estab. %   Neutrophils Absolute 3.2 1.4 - 7.0 x10E3/uL   Lymphocytes Absolute 1.8 0.7 - 3.1 x10E3/uL   MID (Absolute) 0.8 0.1 - 1.6 X10E3/uL  Comprehensive metabolic panel  Result Value Ref Range   Glucose 85 65 - 99 mg/dL   BUN 7 (L) 8 - 27 mg/dL   Creatinine, Ser 0.74 0.57 - 1.00 mg/dL   GFR calc non Af Amer 79 >59 mL/min/1.73   GFR calc Af Amer 91 >59 mL/min/1.73   BUN/Creatinine Ratio 9 (L) 12 - 28   Sodium 143 134 - 144 mmol/L   Potassium 3.4 (L) 3.5 - 5.2 mmol/L   Chloride 104 96 - 106 mmol/L   CO2 21 20 - 29 mmol/L   Calcium 9.6 8.7 - 10.3 mg/dL   Total Protein 7.0 6.0 - 8.5 g/dL   Albumin 4.7 3.7 - 4.7 g/dL   Globulin, Total 2.3 1.5 - 4.5 g/dL   Albumin/Globulin Ratio 2.0 1.2 - 2.2   Bilirubin Total 0.5 0.0 - 1.2 mg/dL   Alkaline Phosphatase 168 (H) 44 - 121 IU/L   AST 17 0 - 40 IU/L   ALT 15 0 - 32 IU/L  TSH  Result Value Ref Range   TSH 1.120 0.450 - 4.500 uIU/mL  Iron, TIBC and Ferritin Panel  Result Value Ref Range   Total Iron Binding Capacity 403 250 - 450 ug/dL   UIBC 337 118 - 369 ug/dL   Iron 66 27 - 139 ug/dL   Iron Saturation 16 15 - 55 %   Ferritin 24 15 - 150 ng/mL  Lipid Panel w/o Chol/HDL Ratio  Result Value Ref Range   Cholesterol, Total 176 100 - 199 mg/dL   Triglycerides 63 0 - 149 mg/dL   HDL 89 >39 mg/dL   VLDL Cholesterol Cal 12 5 - 40 mg/dL   LDL Chol Calc (NIH) 75 0 - 99 mg/dL  Brain natriuretic peptide  Result Value Ref Range   BNP 132.3 (H) 0.0 - 100.0 pg/mL  Cytology - PAP  Result Value Ref Range   High risk HPV Negative    Adequacy      Satisfactory for evaluation. The presence or absence of an   Adequacy      endocervical/transformation zone component cannot be determined because   Adequacy of atrophy.    Diagnosis      - Negative for intraepithelial lesion or malignancy (NILM)   Comment Normal Reference Range HPV - Negative        Assessment & Plan:   Problem List Items Addressed This Visit      Cardiovascular and Mediastinum   Atrial fibrillation with RVR (HCC)    Chronic, ongoing.  Continue collaboration with cardiology at [facility] (reiterated need for them to scheduled appointment ASAP -- new referral placed) and current medication regimen.  Rate control present, 90's apical.        Relevant Medications   flecainide (TAMBOCOR) 50 MG tablet   lisinopril (ZESTRIL) 5 MG tablet   Other Relevant Orders   Ambulatory referral to Cardiology   Essential hypertension  Chronic, ongoing with post-CVA goal <130/90.  Initial BP elevated today, but repeat below goal.  Will continue Diltiazem and Metoprolol at this time, however if elevations present would restart Lisinopril.  Have recommended they check BP at home daily and document. Continue collaboration with cardiology -- schedule follow-up ASAP -- new referral in place.  Plan for follow-up in 4 weeks.      Relevant Medications   flecainide (TAMBOCOR) 50 MG tablet   lisinopril (ZESTRIL) 5 MG tablet   Systolic heart failure (HCC)    Chronic, ongoing.  EF reduced on most recent echo.  Continue collaboration with cardiology at [facility], have reiterated need for husband to schedule this ASAP -- new referral placed.  Continue current medication regimen and adjust as needed, including Metoprolol.  Adjust regimen as needed and monitor for acute exacerbations.  Obtain BNP today as recent mild elevation.  May consider addition of low dose Lasix as needed for short period, although recent K+ 3.4 -- recheck CMP today. - Reminded to call for an overnight weight gain of >2 pounds or a weekly weight weight of >5 pounds - not adding salt to his food and has been reading food labels. Reviewed the importance of keeping daily sodium intake to 2000mg  daily       Relevant Medications   flecainide (TAMBOCOR) 50 MG tablet   lisinopril (ZESTRIL) 5 MG tablet   Other Relevant Orders   B Nat Peptide    Ambulatory referral to Cardiology     Nervous and Auditory   Dementia [facility]) - Primary    Suspect vascular in nature due to recent CVA.  Highly recommend follow-up with neurology.  Discussed at length CVA prevention goals, as noted above under HTN plan.  Attempted to involve CCM team in past, which they have declined multiple times.        Other   History of stroke    Recent in October 2020.  Continue collaboration with neurology and cardiology.  Discussed at length CVA prevention goals: BP <130/90, A1C < 6.5%, and LDL <70.        Bleeding    Ongoing per husband report, but patient denies issues (dementia).  Suspect this is rectal in nature from hemorrhoids -- is on DOAC treatment due to a-fib.  Recent labs showed stable H/H and iron -- recheck today.  Obtain FOBT, sent home with husband.  Referral to GI placed and discussed with patient and husband.  Due to frequency of BM and blood in toilet -- suspect more hemorrhoid related.  Return in 4 weeks, sooner if worsening symptoms.      Relevant Orders   CBC with Differential/Platelet   Comprehensive metabolic panel   Iron, TIBC and Ferritin Panel   Fecal occult blood, imunochemical   Ambulatory referral to Gastroenterology   Frequent bowel movements    Ongoing, ?constipation although patient denies straining (poor historian with dementia).  Recent CT reassuring.  Due to frequency and recent episodes of blood in toilet will refer to GI for further assessment.  Patient on DOAC for A-fib, but recent H/H and iron levels stable -- will recheck today.  Return in 4 weeks, sooner if worsening.         Time: 25+minutes, >50% spent counseling/or care coordination with husband and patient  Follow up plan: Return in about 4 weeks (around 05/25/2020) for BLEEDING.

## 2020-04-28 LAB — COMPREHENSIVE METABOLIC PANEL
ALT: 13 IU/L (ref 0–32)
AST: 16 IU/L (ref 0–40)
Albumin/Globulin Ratio: 2.4 — ABNORMAL HIGH (ref 1.2–2.2)
Albumin: 4.6 g/dL (ref 3.7–4.7)
Alkaline Phosphatase: 159 IU/L — ABNORMAL HIGH (ref 44–121)
BUN/Creatinine Ratio: 14 (ref 12–28)
BUN: 12 mg/dL (ref 8–27)
Bilirubin Total: 0.3 mg/dL (ref 0.0–1.2)
CO2: 17 mmol/L — ABNORMAL LOW (ref 20–29)
Calcium: 9.6 mg/dL (ref 8.7–10.3)
Chloride: 106 mmol/L (ref 96–106)
Creatinine, Ser: 0.88 mg/dL (ref 0.57–1.00)
GFR calc Af Amer: 74 mL/min/{1.73_m2} (ref >59)
GFR calc non Af Amer: 64 mL/min/{1.73_m2} (ref >59)
Globulin, Total: 1.9 g/dL (ref 1.5–4.5)
Glucose: 102 mg/dL — ABNORMAL HIGH (ref 65–99)
Potassium: 4.1 mmol/L (ref 3.5–5.2)
Sodium: 144 mmol/L (ref 134–144)
Total Protein: 6.5 g/dL (ref 6.0–8.5)

## 2020-04-28 LAB — IRON,TIBC AND FERRITIN PANEL
Ferritin: 23 ng/mL (ref 15–150)
Iron Saturation: 13 % — ABNORMAL LOW (ref 15–55)
Iron: 53 ug/dL (ref 27–139)
Total Iron Binding Capacity: 421 ug/dL (ref 250–450)
UIBC: 368 ug/dL (ref 118–369)

## 2020-04-28 NOTE — Progress Notes (Signed)
Please let Mr. Burt Knack and Despina know labs have returned.  Iron level remains stable, CBC was not drawn and I would like this drawn.  Lab attempted to call to see if she could return for labs or go to outpatient clinic, as I know they had difficulty drawing labs at visit and were unable to obtain some samples.  Kidney function and liver function are normal.  Alkaline phosphatase is trending down.  Will recheck next visit.  Please ensure to schedule with GI, referral has gone through.  Also to see cardiology, referral has gone through and they all should be calling. Keep being awesome!!  Thank you for allowing me to participate in your care. Kindest regards, Chantille Navarrete

## 2020-05-01 LAB — COMPREHENSIVE METABOLIC PANEL

## 2020-05-01 LAB — CBC WITH DIFFERENTIAL/PLATELET

## 2020-05-01 LAB — BRAIN NATRIURETIC PEPTIDE

## 2020-05-10 ENCOUNTER — Encounter: Payer: Self-pay | Admitting: *Deleted

## 2020-05-25 ENCOUNTER — Ambulatory Visit: Payer: Medicare PPO | Admitting: Internal Medicine

## 2020-06-07 ENCOUNTER — Telehealth: Payer: Self-pay

## 2020-06-07 ENCOUNTER — Ambulatory Visit: Payer: Medicare PPO

## 2020-06-07 NOTE — Telephone Encounter (Signed)
This nurse attempted to call patient for telephonic AWV. Called at 1025, 1030, 1040. Message left for patient to call in order to reschedule for another time.

## 2020-08-02 IMAGING — CT CT HEAD W/O CM
3 series · 16 of 47 positions shown, 19 images · non-contrast
Comparison: Brain MRI and intracranial MRA 12/04/2018 end earlier.

CLINICAL DATA: 73-year-old female with altered mental status and
confusion. Posterior right MCA territory infarct diagnosed on
12/04/2018.

EXAM:
CT HEAD WITHOUT CONTRAST
TECHNIQUE: Contiguous axial images were obtained from the base of the skull
through the vertex without intravenous contrast.

[Series 3: head wo · axial · 0.44mm/px · z∈[+50,+175]mm · 10 of 31 slices shown, 13 images]
[im 3/31  brain]
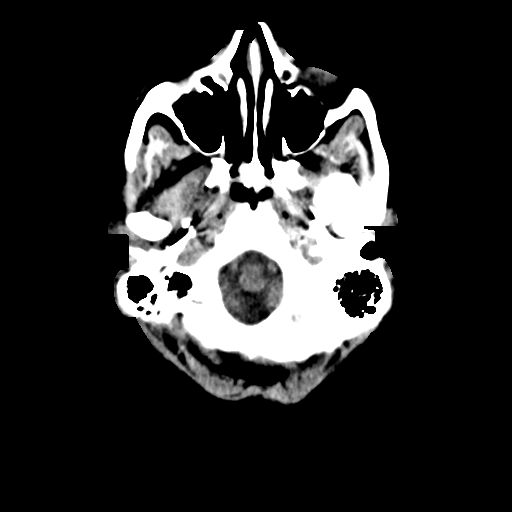
[im 3/31  bone]
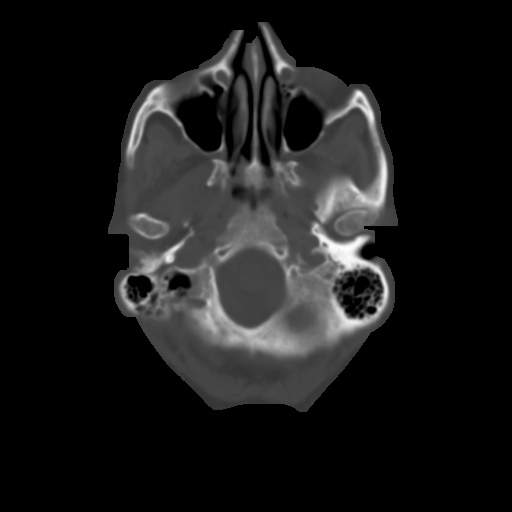
[im 6/31  brain]
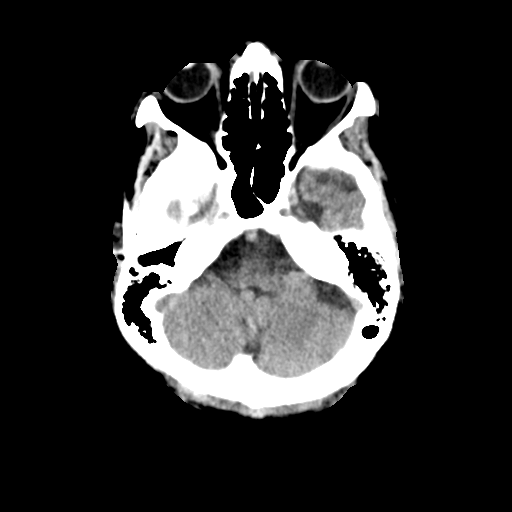
[im 9/31  brain]
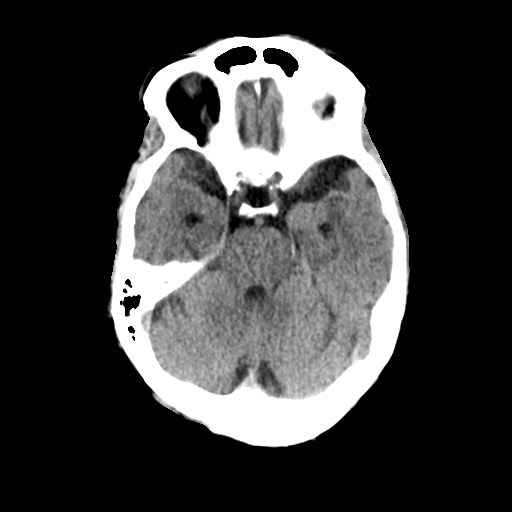
[im 11/31  brain]
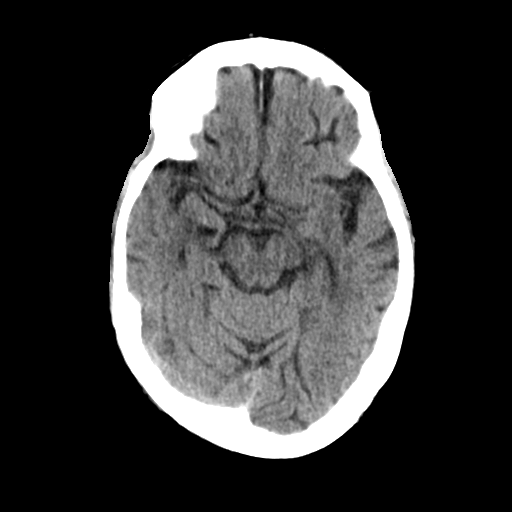
[im 14/31  brain]
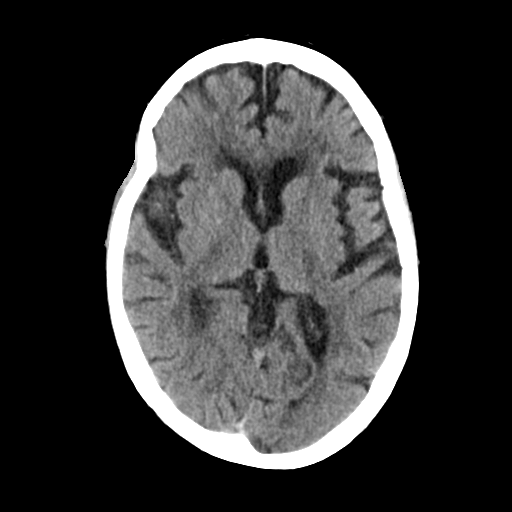
[im 14/31  bone]
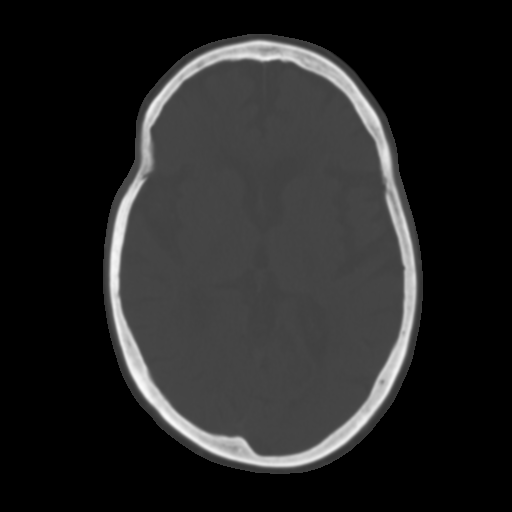
[im 17/31  brain]
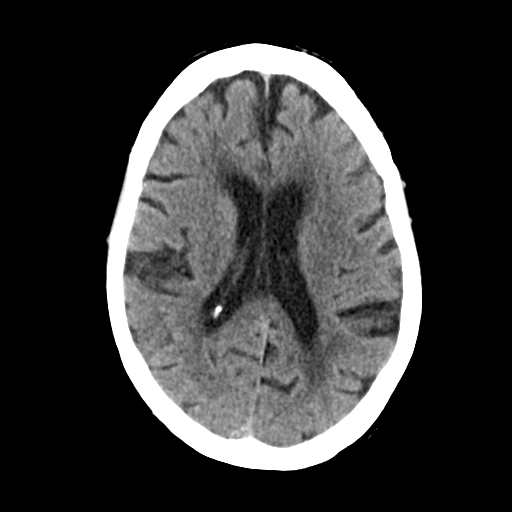
[im 20/31  brain]
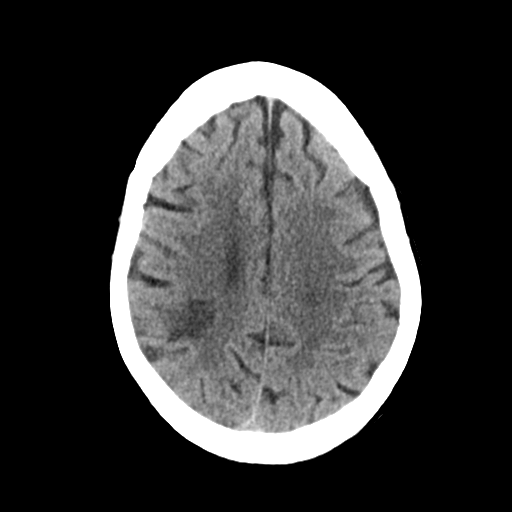
[im 23/31  brain]
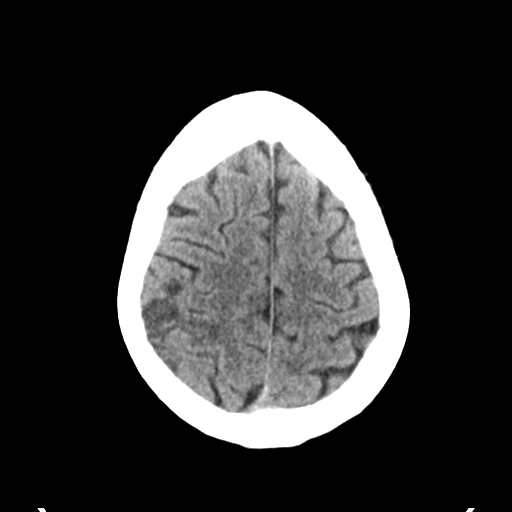
[im 25/31  brain]
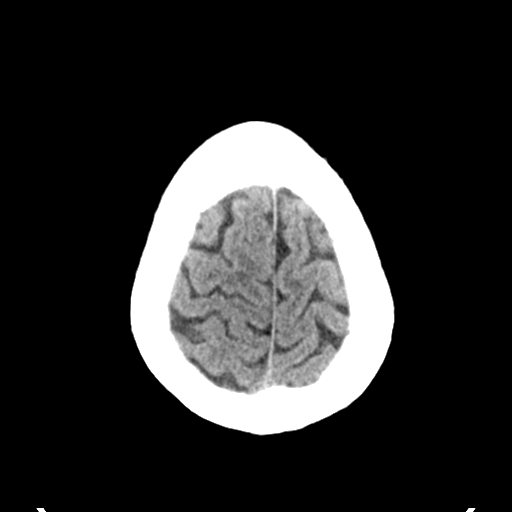
[im 25/31  bone]
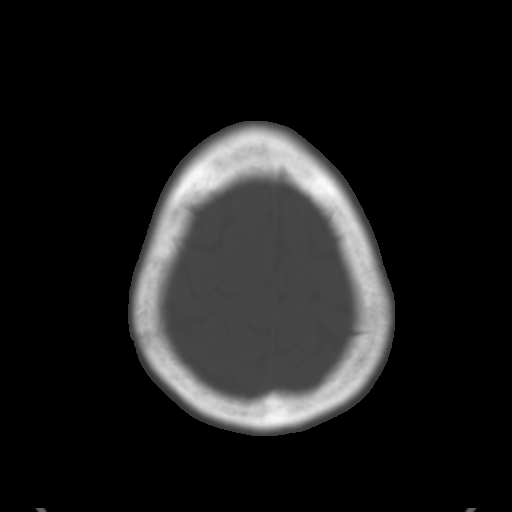
[im 28/31  brain]
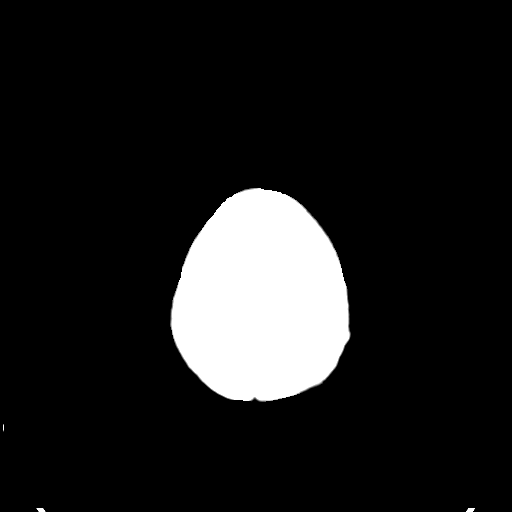

[Series 4: coronal soft tissue · coronal · 0.29mm/px · 3 of 65 slices shown]
[im 24/65  brain]
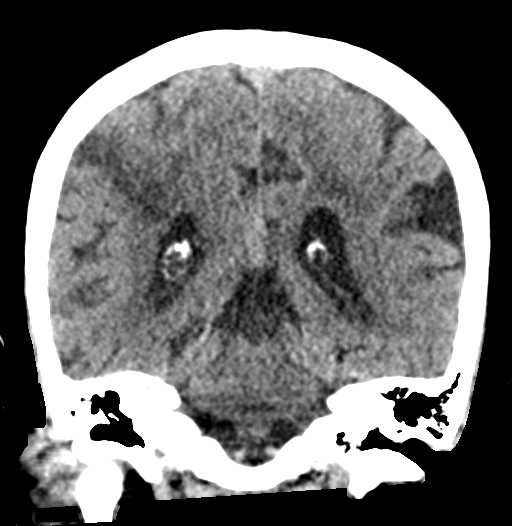
[im 30/65  brain]
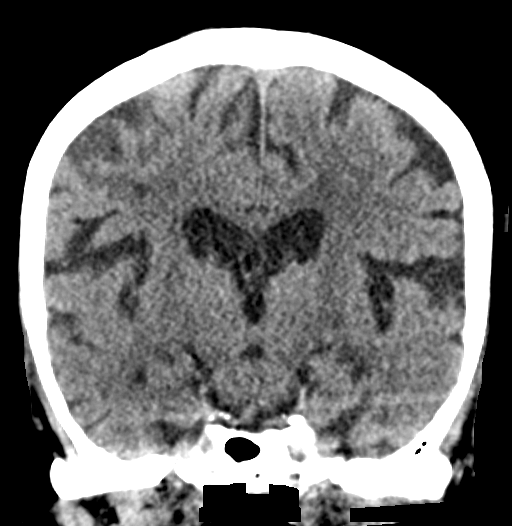
[im 36/65  brain]
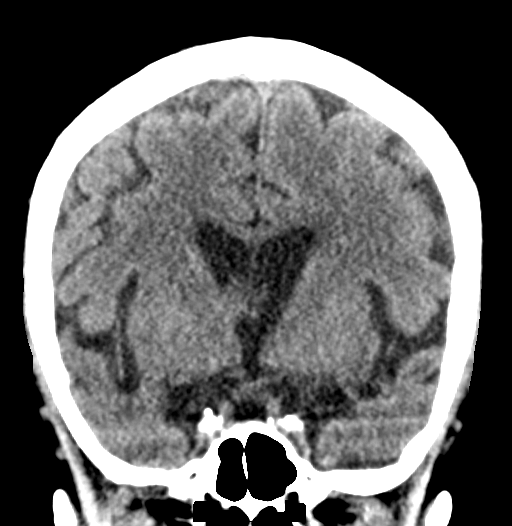

[Series 5: sagittal soft tissue · sagittal · 0.30mm/px · 3 of 51 slices shown]
[im 17/51  brain]
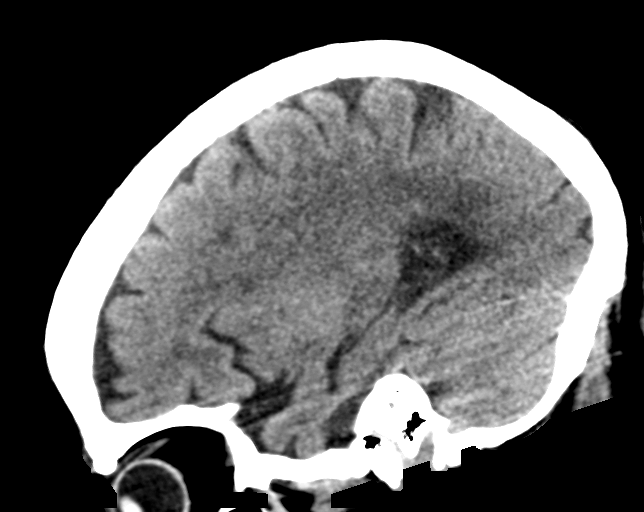
[im 26/51  brain]
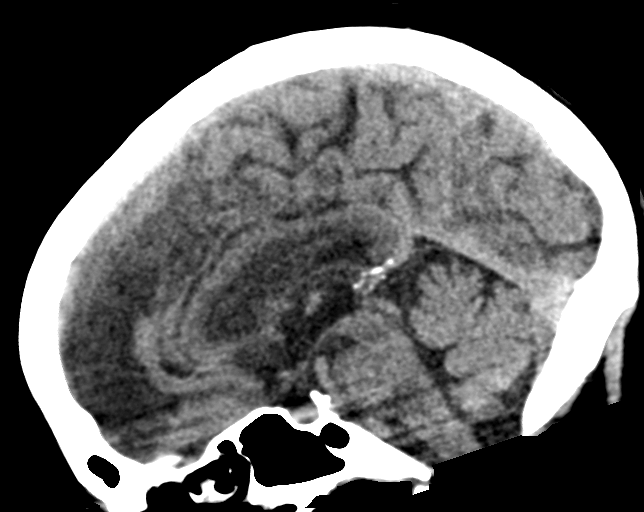
[im 34/51  brain]
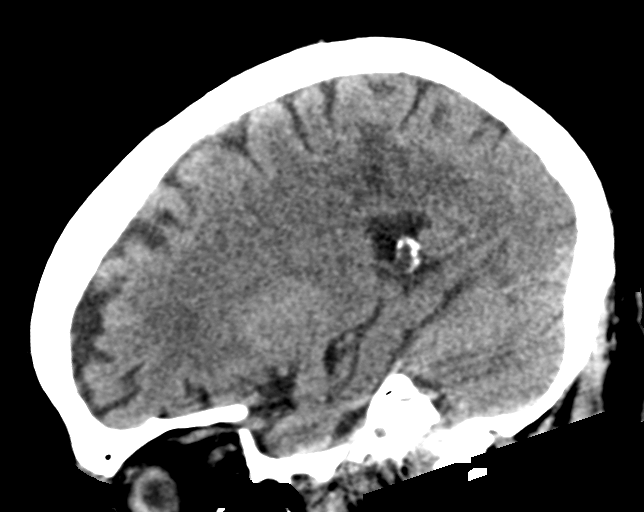

[16 of 47 positions shown; findings below may reference images not displayed]

FINDINGS: Brain: Patchy cytotoxic edema in the posterior right MCA territory
appears stable. No associated hemorrhage or mass effect.

Elsewhere Stable gray-white matter differentiation throughout the
brain.

No midline shift, ventriculomegaly, mass effect, evidence of mass
lesion, or new cortically based infarct.

Vascular: Mild Calcified atherosclerosis at the skull base. No
suspicious intracranial vascular hyperdensity.

Skull: No acute osseous abnormality identified.

Sinuses/Orbits: Visualized paranasal sinuses and mastoids are stable
and well pneumatized.

Other: Visualized orbits and scalp soft tissues are within normal
limits.
IMPRESSION: 1. Stable CT appearance of the right MCA territory infarct since
12/04/2018. No associated hemorrhage or mass effect.
2. No new intracranial abnormality.

## 2020-08-05 IMAGING — CR DG CHEST 2V
2 series · 2 of 2 positions shown · non-contrast
Comparison: 09/29/2018

CLINICAL DATA: Syncopal episode

EXAM:
CHEST - 2 VIEW

[chest lat]
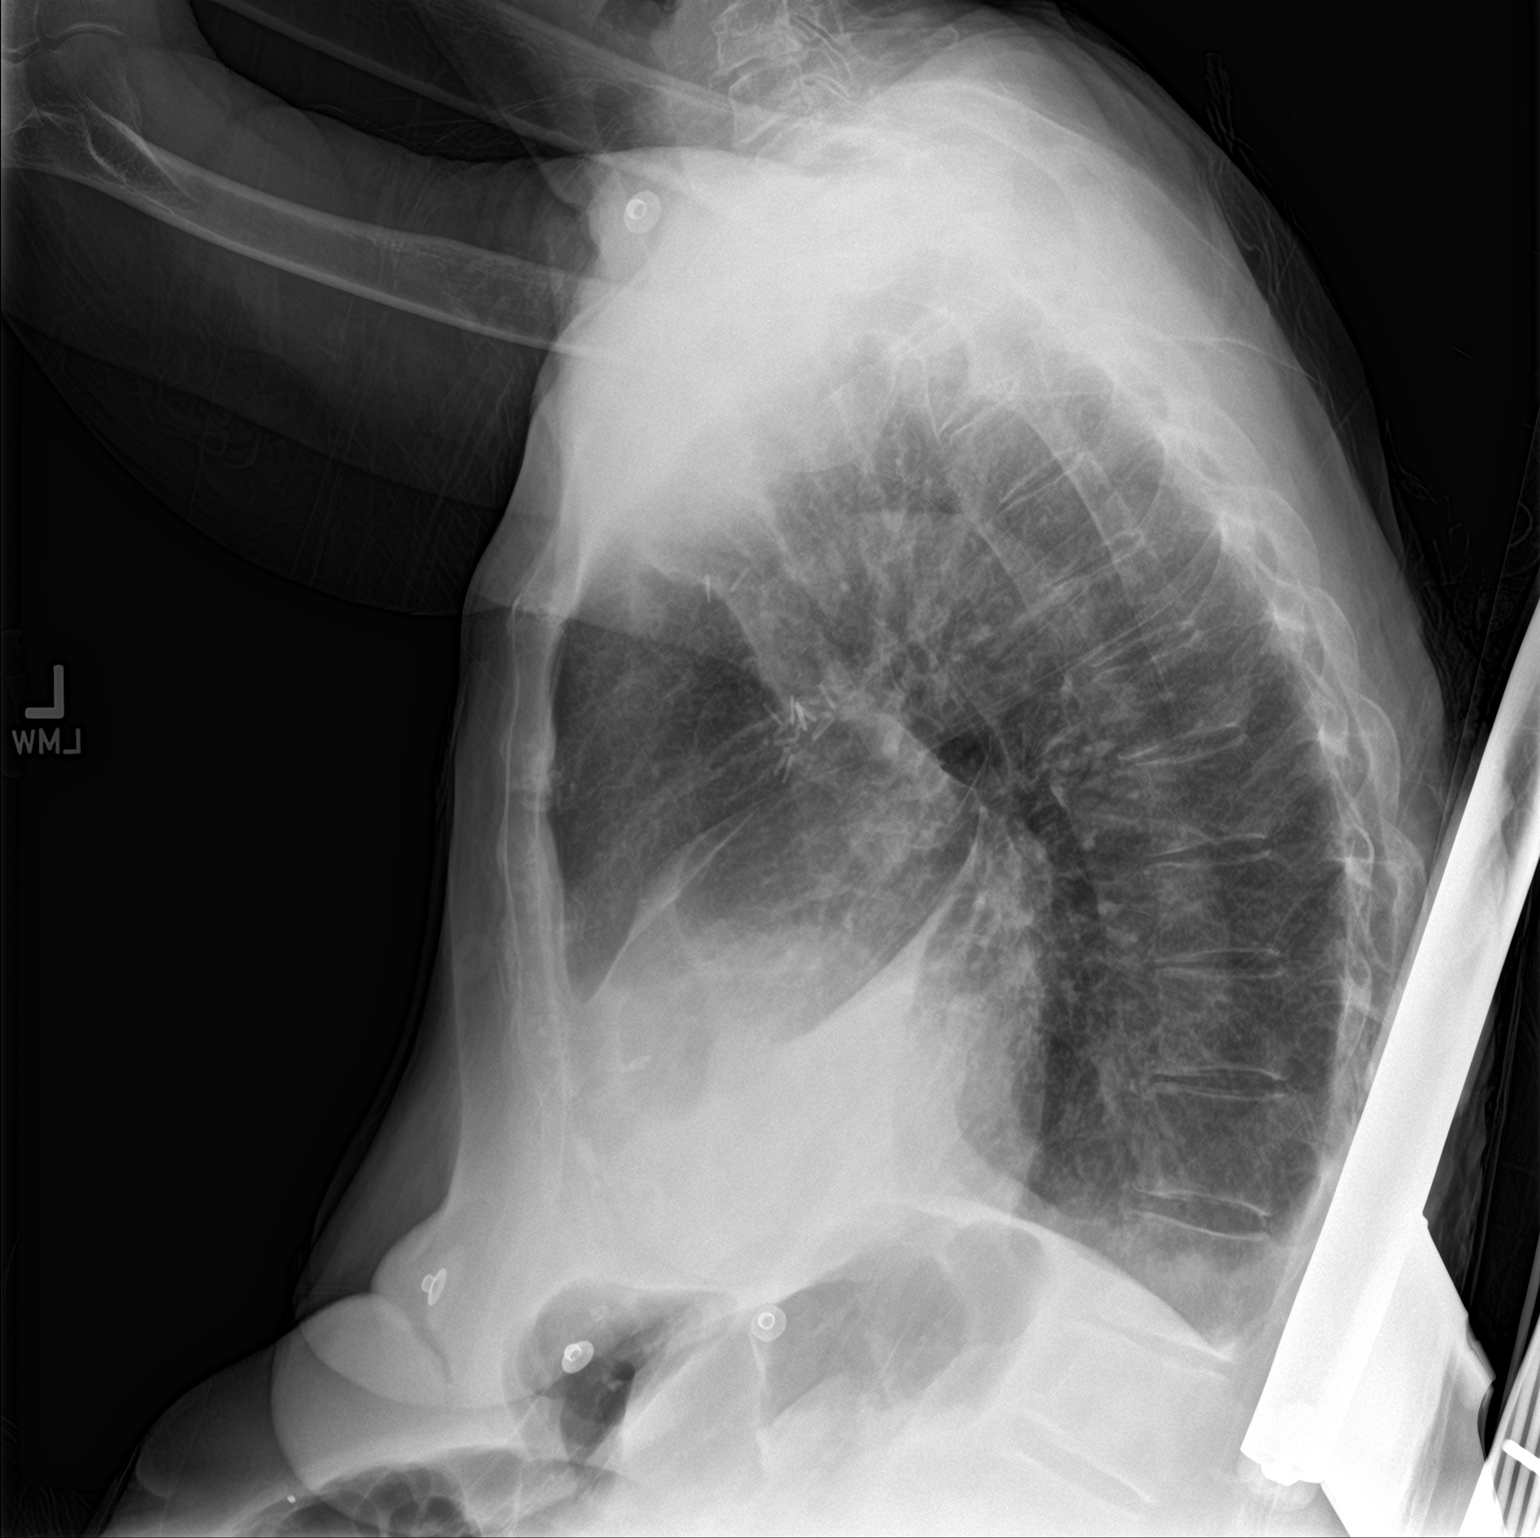

[chest ap]
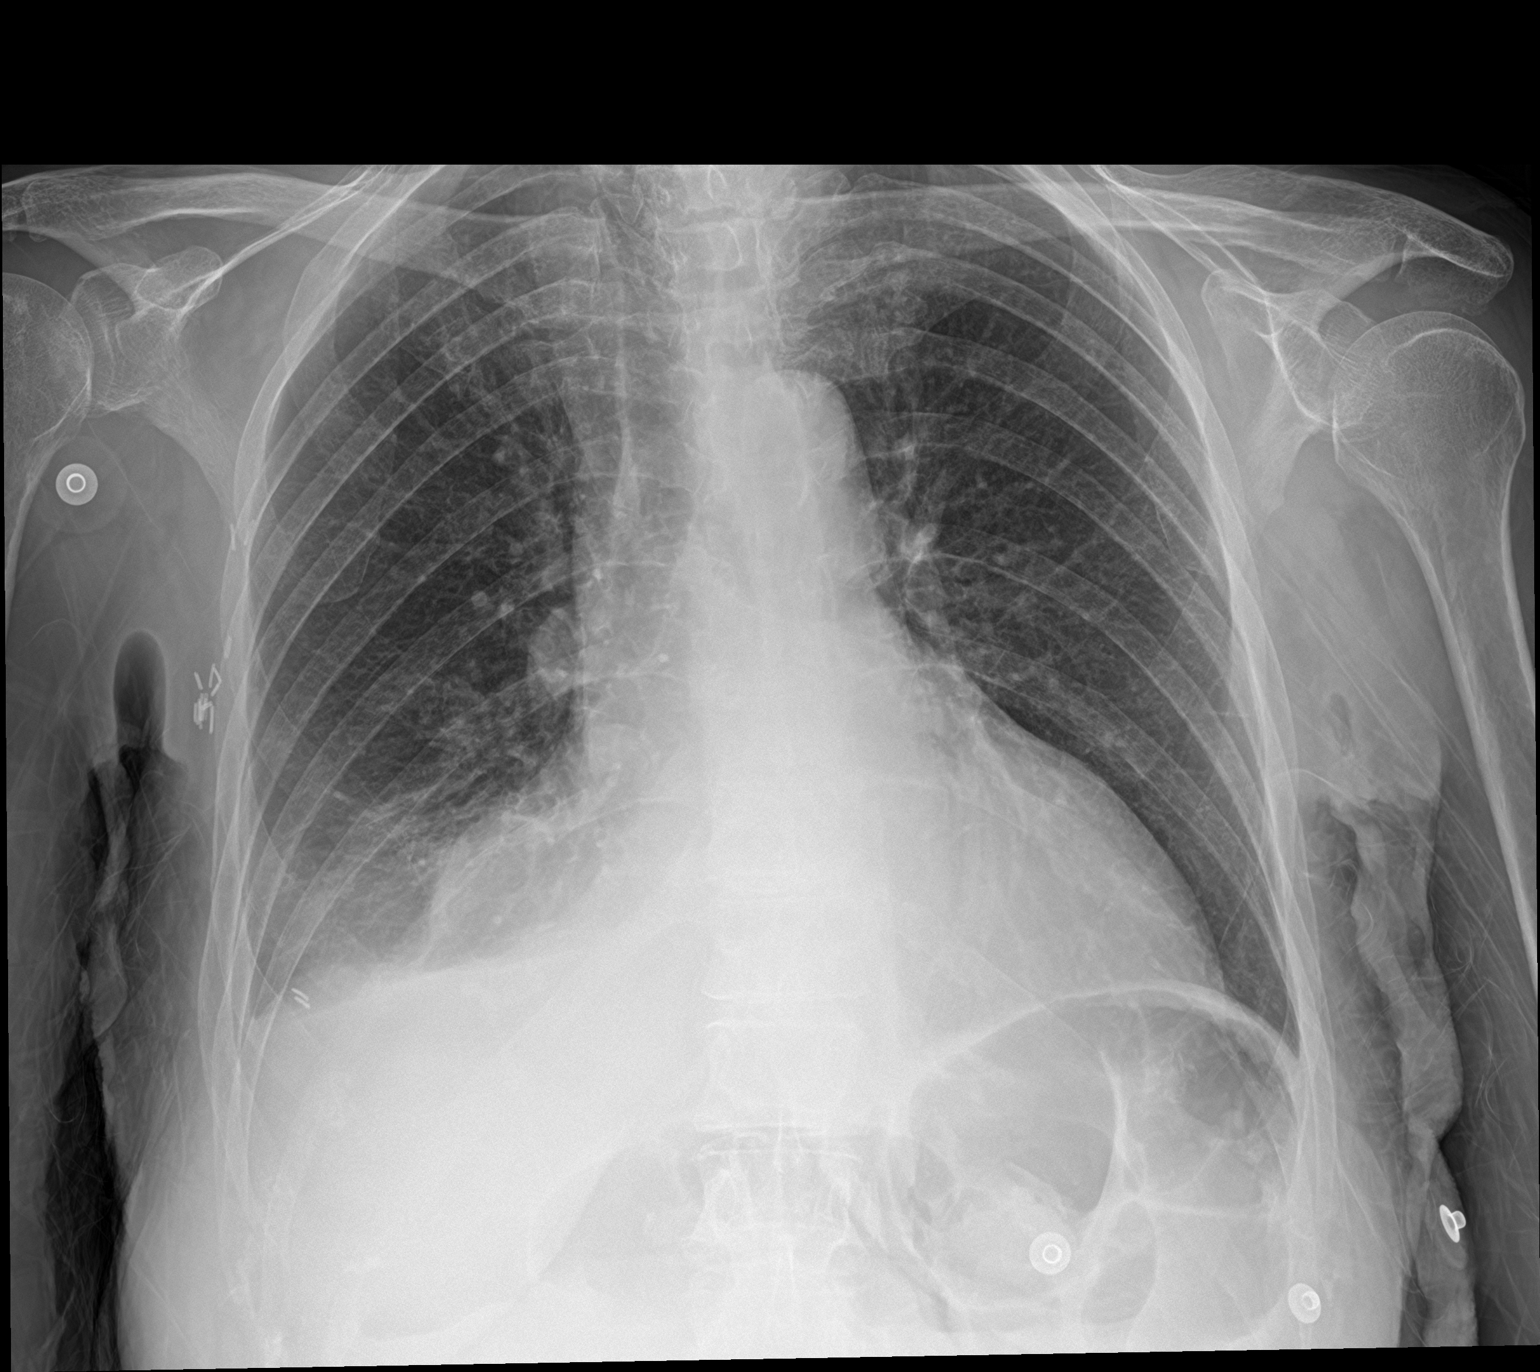

[2 of 2 positions shown; findings below may reference images not displayed]

FINDINGS: Cardiac shadow is enlarged but stable. Aortic calcifications are
again seen. Left lung is clear. Small right pleural effusion is
again noted but stable. Right middle lobe consolidation is noted. No
bony abnormality is noted.
IMPRESSION: Right middle lobe consolidation with associated effusion.

## 2020-08-18 IMAGING — CR DG CHEST 1V
1 series · 1 of 1 positions shown · non-contrast
Comparison: December 14, 2018.

CLINICAL DATA: Weakness.

EXAM:
CHEST  1 VIEW

[chest ap]
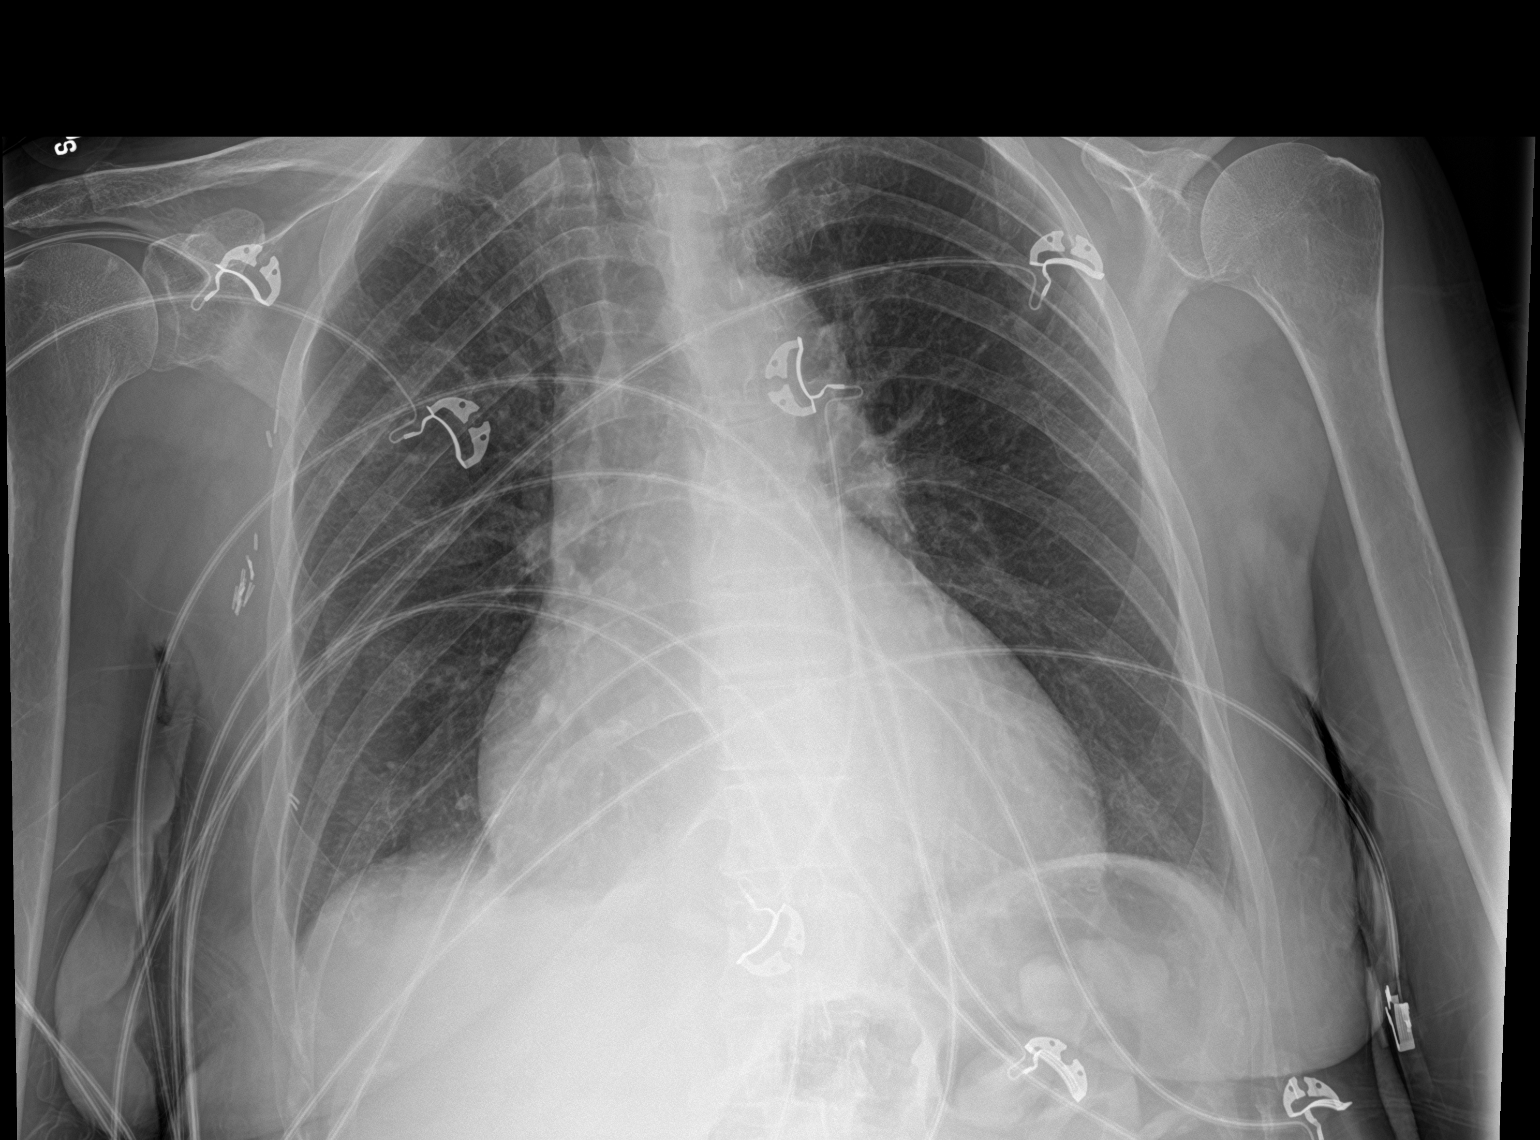

[1 of 1 positions shown; findings below may reference images not displayed]

FINDINGS: Stable cardiomegaly. No pneumothorax or pleural effusion is noted.
Both lungs are clear. The visualized skeletal structures are
unremarkable.
IMPRESSION: No active disease.

## 2020-12-31 ENCOUNTER — Telehealth: Payer: Self-pay | Admitting: Internal Medicine

## 2020-12-31 NOTE — Telephone Encounter (Signed)
Copied from [place] [phone]. Topic: Medicare AWV >> Dec 31, 2020  1:29 PM Lavonia Drafts wrote: Reason for CRM: Left message for patient to call back and schedule the Medicare Annual Wellness Visit (AWV) virtually or by telephone.  Last AWV 06/04/19  Please schedule at anytime with [facility]Nurse Health Advisor.  45 minute appointment  Any questions, please call me at [phone]

## 2021-02-06 ENCOUNTER — Other Ambulatory Visit: Payer: Self-pay

## 2021-02-06 ENCOUNTER — Encounter: Payer: Self-pay | Admitting: Emergency Medicine

## 2021-02-06 ENCOUNTER — Emergency Department: Payer: Medicare PPO

## 2021-02-06 DIAGNOSIS — E785 Hyperlipidemia, unspecified: Secondary | ICD-10-CM | POA: Diagnosis present

## 2021-02-06 DIAGNOSIS — F0390 Unspecified dementia without behavioral disturbance: Secondary | ICD-10-CM | POA: Diagnosis present

## 2021-02-06 DIAGNOSIS — G928 Other toxic encephalopathy: Secondary | ICD-10-CM | POA: Diagnosis present

## 2021-02-06 DIAGNOSIS — Z8673 Personal history of transient ischemic attack (TIA), and cerebral infarction without residual deficits: Secondary | ICD-10-CM

## 2021-02-06 DIAGNOSIS — I480 Paroxysmal atrial fibrillation: Secondary | ICD-10-CM | POA: Diagnosis present

## 2021-02-06 DIAGNOSIS — I110 Hypertensive heart disease with heart failure: Principal | ICD-10-CM | POA: Diagnosis present

## 2021-02-06 DIAGNOSIS — N179 Acute kidney failure, unspecified: Secondary | ICD-10-CM | POA: Diagnosis not present

## 2021-02-06 DIAGNOSIS — R0609 Other forms of dyspnea: Secondary | ICD-10-CM | POA: Diagnosis not present

## 2021-02-06 DIAGNOSIS — E876 Hypokalemia: Secondary | ICD-10-CM | POA: Diagnosis present

## 2021-02-06 DIAGNOSIS — Z20822 Contact with and (suspected) exposure to covid-19: Secondary | ICD-10-CM | POA: Diagnosis present

## 2021-02-06 DIAGNOSIS — I5031 Acute diastolic (congestive) heart failure: Secondary | ICD-10-CM | POA: Diagnosis present

## 2021-02-06 DIAGNOSIS — J9601 Acute respiratory failure with hypoxia: Secondary | ICD-10-CM | POA: Diagnosis present

## 2021-02-06 DIAGNOSIS — Z833 Family history of diabetes mellitus: Secondary | ICD-10-CM

## 2021-02-06 DIAGNOSIS — Z79899 Other long term (current) drug therapy: Secondary | ICD-10-CM

## 2021-02-06 DIAGNOSIS — J9811 Atelectasis: Secondary | ICD-10-CM | POA: Diagnosis present

## 2021-02-06 DIAGNOSIS — Z66 Do not resuscitate: Secondary | ICD-10-CM | POA: Diagnosis present

## 2021-02-06 DIAGNOSIS — Z7982 Long term (current) use of aspirin: Secondary | ICD-10-CM

## 2021-02-06 DIAGNOSIS — Z515 Encounter for palliative care: Secondary | ICD-10-CM

## 2021-02-06 DIAGNOSIS — Z853 Personal history of malignant neoplasm of breast: Secondary | ICD-10-CM

## 2021-02-06 DIAGNOSIS — J690 Pneumonitis due to inhalation of food and vomit: Secondary | ICD-10-CM | POA: Diagnosis present

## 2021-02-06 DIAGNOSIS — Z7901 Long term (current) use of anticoagulants: Secondary | ICD-10-CM

## 2021-02-06 LAB — RESP PANEL BY RT-PCR (FLU A&B, COVID) ARPGX2
Influenza A by PCR: NEGATIVE
Influenza B by PCR: NEGATIVE
SARS Coronavirus 2 by RT PCR: NEGATIVE

## 2021-02-06 LAB — CBC
HCT: 43.7 % (ref 36.0–46.0)
Hemoglobin: 14.2 g/dL (ref 12.0–15.0)
MCH: 28.9 pg (ref 26.0–34.0)
MCHC: 32.5 g/dL (ref 30.0–36.0)
MCV: 89 fL (ref 80.0–100.0)
Platelets: 187 10*3/uL (ref 150–400)
RBC: 4.91 MIL/uL (ref 3.87–5.11)
RDW: 13.7 % (ref 11.5–15.5)
WBC: 12.8 10*3/uL — ABNORMAL HIGH (ref 4.0–10.5)
nRBC: 0 % (ref 0.0–0.2)

## 2021-02-06 LAB — BASIC METABOLIC PANEL
Anion gap: 11 (ref 5–15)
BUN: 11 mg/dL (ref 8–23)
CO2: 24 mmol/L (ref 22–32)
Calcium: 9.1 mg/dL (ref 8.9–10.3)
Chloride: 104 mmol/L (ref 98–111)
Creatinine, Ser: 0.59 mg/dL (ref 0.44–1.00)
GFR, Estimated: >60 mL/min (ref >60)
Glucose, Bld: 98 mg/dL (ref 70–99)
Potassium: 3 mmol/L — ABNORMAL LOW (ref 3.5–5.1)
Sodium: 139 mmol/L (ref 135–145)

## 2021-02-06 LAB — MAGNESIUM: Magnesium: 2.1 mg/dL (ref 1.7–2.4)

## 2021-02-06 LAB — TSH: TSH: 0.627 u[IU]/mL (ref 0.350–4.500)

## 2021-02-06 LAB — TROPONIN I (HIGH SENSITIVITY): Troponin I (High Sensitivity): 13 ng/L (ref <18)

## 2021-02-06 NOTE — ED Notes (Signed)
Lab here to draw labs.

## 2021-02-06 NOTE — ED Triage Notes (Addendum)
Pt arrived via [facility] with reports of weakness for several days along with poor PO intake. Pt has hx of afib and CVA has not taken meds since yesterday.  Pt is alert and oriented, pt also has cough present. Denies any fevers. Denies any pain.  Pt has also been mutliple episodes of diarrhea daily for several years, family at bedside said the pt needs a "GI series"  but the pt refused.

## 2021-02-06 NOTE — ED Notes (Signed)
Visitor to desk states they have been waiting for 3 hours and also states the pt is short of breath, vital signs evaluated at this time, pt is in no distress at this time, denies any pain and denies any shortness of breath, I asked pt if she had her Xray, visitor states "I don't know, they took her back there for something"  then he made a comment about "they have done much"  explained to visitor that we have done, vital signs once already, labs, ekg, and xray and that right now she is waiting to see the doctor. Explained the process of waiting in an emergency dept and that more critical pts that arrive will be seen ahead of those that are waiting.    Pt denies any further needs at this time.

## 2021-02-06 NOTE — ED Triage Notes (Signed)
FIRST NURSE NOTE:  Pt arrived via [facility] from home with c/o weakness worse today, decreased PO intake, no meds since yesterday this AM, hx of Afib and HTN, p-80-140 170/90 97.7 temp, CBG 127 95% RA lungs clear with EMS.  500cc IVF with EMS 20g L hand no complaints of pain.

## 2021-02-07 ENCOUNTER — Inpatient Hospital Stay
Admission: EM | Admit: 2021-02-07 | Discharge: 2021-02-14 | DRG: 291 | Disposition: A | Discharge status: Skilled Nursing Facility | Payer: Medicare PPO | Attending: Internal Medicine | Admitting: Internal Medicine

## 2021-02-07 ENCOUNTER — Inpatient Hospital Stay
Admit: 2021-02-07 | Discharge: 2021-02-07 | Disposition: A | Discharge status: Home / Self Care | Payer: Medicare PPO | Attending: Family Medicine | Admitting: Family Medicine

## 2021-02-07 ENCOUNTER — Other Ambulatory Visit: Payer: Self-pay

## 2021-02-07 DIAGNOSIS — E785 Hyperlipidemia, unspecified: Secondary | ICD-10-CM

## 2021-02-07 DIAGNOSIS — I5031 Acute diastolic (congestive) heart failure: Secondary | ICD-10-CM | POA: Diagnosis present

## 2021-02-07 DIAGNOSIS — Z853 Personal history of malignant neoplasm of breast: Secondary | ICD-10-CM | POA: Diagnosis not present

## 2021-02-07 DIAGNOSIS — I10 Essential (primary) hypertension: Secondary | ICD-10-CM | POA: Diagnosis not present

## 2021-02-07 DIAGNOSIS — G928 Other toxic encephalopathy: Secondary | ICD-10-CM | POA: Diagnosis present

## 2021-02-07 DIAGNOSIS — I5023 Acute on chronic systolic (congestive) heart failure: Secondary | ICD-10-CM | POA: Diagnosis not present

## 2021-02-07 DIAGNOSIS — F0390 Unspecified dementia without behavioral disturbance: Secondary | ICD-10-CM | POA: Diagnosis present

## 2021-02-07 DIAGNOSIS — Z8673 Personal history of transient ischemic attack (TIA), and cerebral infarction without residual deficits: Secondary | ICD-10-CM | POA: Diagnosis not present

## 2021-02-07 DIAGNOSIS — J9811 Atelectasis: Secondary | ICD-10-CM | POA: Diagnosis present

## 2021-02-07 DIAGNOSIS — I5021 Acute systolic (congestive) heart failure: Secondary | ICD-10-CM | POA: Diagnosis not present

## 2021-02-07 DIAGNOSIS — N179 Acute kidney failure, unspecified: Secondary | ICD-10-CM | POA: Diagnosis not present

## 2021-02-07 DIAGNOSIS — Z79899 Other long term (current) drug therapy: Secondary | ICD-10-CM | POA: Diagnosis not present

## 2021-02-07 DIAGNOSIS — E876 Hypokalemia: Secondary | ICD-10-CM | POA: Diagnosis present

## 2021-02-07 DIAGNOSIS — I4891 Unspecified atrial fibrillation: Secondary | ICD-10-CM | POA: Diagnosis not present

## 2021-02-07 DIAGNOSIS — I480 Paroxysmal atrial fibrillation: Secondary | ICD-10-CM | POA: Diagnosis present

## 2021-02-07 DIAGNOSIS — Z7901 Long term (current) use of anticoagulants: Secondary | ICD-10-CM | POA: Diagnosis not present

## 2021-02-07 DIAGNOSIS — R0609 Other forms of dyspnea: Secondary | ICD-10-CM | POA: Diagnosis present

## 2021-02-07 DIAGNOSIS — Z515 Encounter for palliative care: Secondary | ICD-10-CM | POA: Diagnosis not present

## 2021-02-07 DIAGNOSIS — Z7189 Other specified counseling: Secondary | ICD-10-CM | POA: Diagnosis not present

## 2021-02-07 DIAGNOSIS — Z7982 Long term (current) use of aspirin: Secondary | ICD-10-CM | POA: Diagnosis not present

## 2021-02-07 DIAGNOSIS — I110 Hypertensive heart disease with heart failure: Secondary | ICD-10-CM | POA: Diagnosis present

## 2021-02-07 DIAGNOSIS — I5041 Acute combined systolic (congestive) and diastolic (congestive) heart failure: Secondary | ICD-10-CM | POA: Diagnosis not present

## 2021-02-07 DIAGNOSIS — R0600 Dyspnea, unspecified: Secondary | ICD-10-CM

## 2021-02-07 DIAGNOSIS — Z66 Do not resuscitate: Secondary | ICD-10-CM | POA: Diagnosis present

## 2021-02-07 DIAGNOSIS — Z20822 Contact with and (suspected) exposure to covid-19: Secondary | ICD-10-CM | POA: Diagnosis present

## 2021-02-07 DIAGNOSIS — J690 Pneumonitis due to inhalation of food and vomit: Secondary | ICD-10-CM | POA: Diagnosis present

## 2021-02-07 DIAGNOSIS — Z833 Family history of diabetes mellitus: Secondary | ICD-10-CM | POA: Diagnosis not present

## 2021-02-07 DIAGNOSIS — R0989 Other specified symptoms and signs involving the circulatory and respiratory systems: Secondary | ICD-10-CM

## 2021-02-07 DIAGNOSIS — J9601 Acute respiratory failure with hypoxia: Secondary | ICD-10-CM | POA: Diagnosis present

## 2021-02-07 DIAGNOSIS — R531 Weakness: Secondary | ICD-10-CM

## 2021-02-07 DIAGNOSIS — I509 Heart failure, unspecified: Secondary | ICD-10-CM

## 2021-02-07 HISTORY — DX: Unspecified dementia, unspecified severity, without behavioral disturbance, psychotic disturbance, mood disturbance, and anxiety: F03.90

## 2021-02-07 HISTORY — DX: Heart failure, unspecified: I50.9

## 2021-02-07 LAB — CBC
HCT: 41.1 % (ref 36.0–46.0)
Hemoglobin: 13.4 g/dL (ref 12.0–15.0)
MCH: 29 pg (ref 26.0–34.0)
MCHC: 32.6 g/dL (ref 30.0–36.0)
MCV: 89 fL (ref 80.0–100.0)
Platelets: 194 10*3/uL (ref 150–400)
RBC: 4.62 MIL/uL (ref 3.87–5.11)
RDW: 13.7 % (ref 11.5–15.5)
WBC: 13.9 10*3/uL — ABNORMAL HIGH (ref 4.0–10.5)
nRBC: 0 % (ref 0.0–0.2)

## 2021-02-07 LAB — URINALYSIS, ROUTINE W REFLEX MICROSCOPIC
Bilirubin Urine: NEGATIVE
Glucose, UA: NEGATIVE mg/dL
Ketones, ur: 80 mg/dL — AB
Nitrite: NEGATIVE
Protein, ur: NEGATIVE mg/dL
Specific Gravity, Urine: 1.025 (ref 1.005–1.030)
pH: 6 (ref 5.0–8.0)

## 2021-02-07 LAB — ECHOCARDIOGRAM COMPLETE
AR max vel: 2.2 cm2
AV Area VTI: 2.53 cm2
AV Area mean vel: 2.35 cm2
AV Mean grad: 3 mmHg
AV Peak grad: 5.7 mmHg
Ao pk vel: 1.19 m/s
Area-P 1/2: 5.57 cm2
S' Lateral: 3.83 cm

## 2021-02-07 LAB — URINALYSIS, MICROSCOPIC (REFLEX)

## 2021-02-07 LAB — BASIC METABOLIC PANEL
Anion gap: 13 (ref 5–15)
BUN: 12 mg/dL (ref 8–23)
CO2: 23 mmol/L (ref 22–32)
Calcium: 8.7 mg/dL — ABNORMAL LOW (ref 8.9–10.3)
Chloride: 104 mmol/L (ref 98–111)
Creatinine, Ser: 0.66 mg/dL (ref 0.44–1.00)
GFR, Estimated: >60 mL/min (ref >60)
Glucose, Bld: 108 mg/dL — ABNORMAL HIGH (ref 70–99)
Potassium: 2.9 mmol/L — ABNORMAL LOW (ref 3.5–5.1)
Sodium: 140 mmol/L (ref 135–145)

## 2021-02-07 LAB — HEPATIC FUNCTION PANEL
ALT: 14 U/L (ref 0–44)
AST: 18 U/L (ref 15–41)
Albumin: 4.2 g/dL (ref 3.5–5.0)
Alkaline Phosphatase: 101 U/L (ref 38–126)
Bilirubin, Direct: 0.4 mg/dL — ABNORMAL HIGH (ref 0.0–0.2)
Indirect Bilirubin: 1.2 mg/dL — ABNORMAL HIGH (ref 0.3–0.9)
Total Bilirubin: 1.6 mg/dL — ABNORMAL HIGH (ref 0.3–1.2)
Total Protein: 7.5 g/dL (ref 6.5–8.1)

## 2021-02-07 LAB — LIPASE, BLOOD: Lipase: 27 U/L (ref 11–51)

## 2021-02-07 LAB — TROPONIN I (HIGH SENSITIVITY): Troponin I (High Sensitivity): 12 ng/L (ref <18)

## 2021-02-07 MED ORDER — DILTIAZEM HCL ER COATED BEADS 180 MG PO CP24
180.0000 mg | ORAL_CAPSULE | Freq: Two times a day (BID) | ORAL | Status: DC
  Administered 2021-02-07 – 2021-02-09 (×5): 180 mg via ORAL
  Filled 2021-02-07 (×7): qty 1

## 2021-02-07 MED ORDER — DILTIAZEM HCL 25 MG/5ML IV SOLN
15.0000 mg | INTRAVENOUS | Status: AC
  Administered 2021-02-07: 15 mg via INTRAVENOUS
  Filled 2021-02-07: qty 5

## 2021-02-07 MED ORDER — ONDANSETRON HCL 4 MG PO TABS
4.0000 mg | ORAL_TABLET | Freq: Four times a day (QID) | ORAL | Status: DC | PRN
  Administered 2021-02-09: 4 mg via ORAL
  Filled 2021-02-07: qty 1

## 2021-02-07 MED ORDER — DILTIAZEM HCL 60 MG PO TABS
120.0000 mg | ORAL_TABLET | ORAL | Status: AC
  Administered 2021-02-07: 120 mg via ORAL
  Filled 2021-02-07: qty 2

## 2021-02-07 MED ORDER — FUROSEMIDE 10 MG/ML IJ SOLN
20.0000 mg | Freq: Once | INTRAMUSCULAR | Status: AC
  Administered 2021-02-07: 20 mg via INTRAVENOUS

## 2021-02-07 MED ORDER — FUROSEMIDE 10 MG/ML IJ SOLN
20.0000 mg | Freq: Two times a day (BID) | INTRAMUSCULAR | Status: DC
  Administered 2021-02-07 – 2021-02-09 (×5): 20 mg via INTRAVENOUS
  Filled 2021-02-07: qty 2
  Filled 2021-02-07: qty 4
  Filled 2021-02-07 (×2): qty 2
  Filled 2021-02-07: qty 4

## 2021-02-07 MED ORDER — ROSUVASTATIN CALCIUM 10 MG PO TABS
10.0000 mg | ORAL_TABLET | Freq: Every day | ORAL | Status: DC
  Administered 2021-02-07 – 2021-02-14 (×8): 10 mg via ORAL
  Filled 2021-02-07 (×9): qty 1

## 2021-02-07 MED ORDER — METOPROLOL SUCCINATE ER 25 MG PO TB24
25.0000 mg | ORAL_TABLET | Freq: Every day | ORAL | Status: DC
  Administered 2021-02-07 – 2021-02-09 (×3): 25 mg via ORAL
  Filled 2021-02-07 (×3): qty 1

## 2021-02-07 MED ORDER — LISINOPRIL 5 MG PO TABS
5.0000 mg | ORAL_TABLET | Freq: Every day | ORAL | Status: DC
  Administered 2021-02-07 – 2021-02-09 (×3): 5 mg via ORAL
  Filled 2021-02-07 (×3): qty 1

## 2021-02-07 MED ORDER — MAGNESIUM HYDROXIDE 400 MG/5ML PO SUSP: 30.0000 mL | Freq: Every day | ORAL | Status: DC | PRN

## 2021-02-07 MED ORDER — SODIUM CHLORIDE 0.9 % IV SOLN: INTRAVENOUS | Status: DC

## 2021-02-07 MED ORDER — AMIODARONE HCL IN DEXTROSE 360-4.14 MG/200ML-% IV SOLN
60.0000 mg/h | INTRAVENOUS | Status: AC
  Administered 2021-02-07: 60 mg/h via INTRAVENOUS

## 2021-02-07 MED ORDER — ASPIRIN EC 81 MG PO TBEC
81.0000 mg | DELAYED_RELEASE_TABLET | Freq: Every day | ORAL | Status: DC
  Administered 2021-02-07 – 2021-02-09 (×3): 81 mg via ORAL
  Filled 2021-02-07 (×3): qty 1

## 2021-02-07 MED ORDER — TRAZODONE HCL 50 MG PO TABS
25.0000 mg | ORAL_TABLET | Freq: Every evening | ORAL | Status: DC | PRN
  Administered 2021-02-07: 25 mg via ORAL
  Filled 2021-02-07: qty 1

## 2021-02-07 MED ORDER — FUROSEMIDE 10 MG/ML IJ SOLN
20.0000 mg | Freq: Once | INTRAMUSCULAR | Status: DC
  Filled 2021-02-07: qty 4

## 2021-02-07 MED ORDER — AMIODARONE LOAD VIA INFUSION
150.0000 mg | Freq: Once | INTRAVENOUS | Status: AC
  Administered 2021-02-07: 150 mg via INTRAVENOUS
  Filled 2021-02-07: qty 83.34

## 2021-02-07 MED ORDER — ACETAMINOPHEN 325 MG PO TABS: 650.0000 mg | ORAL_TABLET | Freq: Four times a day (QID) | ORAL | Status: DC | PRN

## 2021-02-07 MED ORDER — ONDANSETRON HCL 4 MG/2ML IJ SOLN: 4.0000 mg | Freq: Four times a day (QID) | INTRAMUSCULAR | Status: DC | PRN

## 2021-02-07 MED ORDER — APIXABAN 5 MG PO TABS
5.0000 mg | ORAL_TABLET | Freq: Two times a day (BID) | ORAL | Status: DC
  Administered 2021-02-07 – 2021-02-14 (×15): 5 mg via ORAL
  Filled 2021-02-07 (×15): qty 1

## 2021-02-07 MED ORDER — ACETAMINOPHEN 650 MG RE SUPP
650.0000 mg | Freq: Four times a day (QID) | RECTAL | Status: DC | PRN
  Filled 2021-02-07: qty 1

## 2021-02-07 MED ORDER — POTASSIUM CHLORIDE CRYS ER 20 MEQ PO TBCR
40.0000 meq | EXTENDED_RELEASE_TABLET | ORAL | Status: AC
  Administered 2021-02-07 (×2): 40 meq via ORAL
  Filled 2021-02-07 (×2): qty 2

## 2021-02-07 MED ORDER — AMIODARONE HCL IN DEXTROSE 360-4.14 MG/200ML-% IV SOLN
30.0000 mg/h | INTRAVENOUS | Status: DC
  Administered 2021-02-07 – 2021-02-08 (×3): 30 mg/h via INTRAVENOUS
  Filled 2021-02-07 (×4): qty 200

## 2021-02-07 NOTE — H&P (Addendum)
[place]   PATIENT NAME: Norma Hill    MR#:  [id number]  DATE OF BIRTH:  21-Nov-1945  DATE OF ADMISSION:  02/07/2021  PRIMARY CARE PHYSICIAN: Charlynne Cousins, MD   Patient is coming from: Home  REQUESTING/REFERRING PHYSICIAN: Hinda Kehr, MD  CHIEF COMPLAINT:   Chief Complaint  Patient presents with   Weakness    HISTORY OF PRESENT ILLNESS:  Norma Hill is a 75 y.o. female with medical history significant for atrial fibrillation, n hypertension and breast cancer, who presented to emergency room with acute onset of dyspnea with associated dry cough without wheezing with paroxysmal nocturnal dyspnea as well as dyspnea on exertion with associated generalized weakness.  She has been having worsening lower extremity edema.  No fever or chills.  No nausea or vomiting or abdominal pain.  She has been having diminished p.o. intake lately.  She denies any palpitations or chest pain.  No dysuria, oliguria or hematuria or flank pain.   ED Course: Upon position to the emergency room blood pressure was 182/98 with a heart rate of 101 with otherwise normal vital signs.  Labs revealed hypokalemia 3 otherwise unremarkable BMP.  LFTs with 1.6 total bili with 1.2 indirect bili 0.4 direct bili.  I was antigens and COVID-19 PCR came back negative.  TSH was 0.67.  EKG showed atrial fibrillation with rapid ventricular sponsor of 150 with PVCs.  There were lateral ST segment depression.  Imaging: Chest x-ray showed small right greater than left pleural effusion, cardiomegaly with mild interstitial prominence concerning for heart failure and volume overload.  The patient was given 20 g of IV Lasix and 15 mg of IV Cardizem bolus followed by 120 p.o. Cardizem.  She will be admitted to a cardiac telemetry bed for further evaluation and management. PAST MEDICAL HISTORY:   Past Medical History:  Diagnosis Date   A-fib [facility])    Bowel trouble 2012   ocasionally   Cancer [facility]) 10/2000   pt  had 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 Pt then had a repeat excision on 11/16/2000   Hypertension 2006   Personal history of malignant neoplasm of breast 2002   Solitary cyst of breast 2012   Special screening for malignant neoplasms, colon     PAST SURGICAL HISTORY:   Past Surgical History:  Procedure Laterality Date   BREAST BIOPSY Right 2002   BREAST SURGERY Right 2002   wide excision and sn x4 done 10/29/00 with repeat excision done on 11/16/2000 with post operative radiation treatment and 5 years of tamoxifen with completion in 2007   CHOLECYSTECTOMY  2002   COLONOSCOPY  2008   [place]  1990,s   ELECTROPHYSIOLOGIC STUDY N/A 03/16/2016   Procedure: CARDIOVERSION;  Surgeon: Isaias Cowman, MD;  Location: [facility] ORS;  Service: Cardiovascular;  Laterality: N/A;    SOCIAL HISTORY:   Social History   Tobacco Use   Smoking status: Never   Smokeless tobacco: Never  Substance Use Topics   Alcohol use: No    FAMILY HISTORY:   Family History  Problem Relation Age of Onset   Cancer Other        breast, relationship not listed   Dementia Mother    Diabetes Brother     DRUG ALLERGIES:  No Known Allergies  REVIEW OF SYSTEMS:   ROS As per history of present illness. All pertinent systems were reviewed above. Constitutional, HEENT, cardiovascular, respiratory, GI,  GU, musculoskeletal, neuro, psychiatric, endocrine, integumentary and hematologic systems were reviewed and are otherwise negative/unremarkable except for positive findings mentioned above in the HPI.   MEDICATIONS AT HOME:   Prior to Admission medications   Medication Sig Start Date End Date Taking? Authorizing Provider  acetaminophen (TYLENOL) 325 MG tablet Take 2 tablets (650 mg total) by mouth every 6 (six) hours as needed for mild pain (or Fever >/= 101). 12/20/18   Loletha Grayer, MD  apixaban (ELIQUIS) 5 MG TABS tablet Take 1  tablet (5 mg total) by mouth 2 (two) times daily. 04/13/20   Marnee Guarneri T, NP  aspirin EC 81 MG EC tablet Take 1 tablet (81 mg total) by mouth daily. 12/10/18   Dustin Flock, MD  diltiazem (CARDIZEM CD) 180 MG 24 hr capsule Take 1 capsule (180 mg total) by mouth 2 (two) times daily. 04/13/20   Cannady, Henrine Screws T, NP  flecainide (TAMBOCOR) 50 MG tablet Take 1 tablet by mouth 2 (two) times daily. 01/25/18   [provider]  lisinopril (ZESTRIL) 5 MG tablet Take 1 tablet by mouth daily. 02/07/18   [provider]  metoprolol succinate (TOPROL-XL) 25 MG 24 hr tablet Take 1 tablet (25 mg total) by mouth daily. 04/13/20   Cannady, Henrine Screws T, NP  polyethylene glycol (MIRALAX / GLYCOLAX) 17 g packet Take 17 g by mouth 2 (two) times daily. Patient not taking: Reported on 04/27/2020 12/20/18   Loletha Grayer, MD  rosuvastatin (CRESTOR) 10 MG tablet Take 1 tablet (10 mg total) by mouth daily. 04/13/20   Cannady, Henrine Screws T, NP  senna (SENOKOT) 8.6 MG TABS tablet Take 2 tablets by mouth 2 (two) times daily. Patient not taking: Reported on 04/27/2020    [provider]  tamsulosin (FLOMAX) 0.4 MG CAPS capsule Take 1 capsule (0.4 mg total) by mouth daily. 04/13/20   Cannady, Henrine Screws T, NP      VITAL SIGNS:  Blood pressure 115/80, pulse 96, temperature 97.9 F (36.6 C), temperature source Oral, resp. rate (!) 22, height 5\' 2"  (1.575 m), weight 65.8 kg, SpO2 93 %.  PHYSICAL EXAMINATION:  Physical Exam  GENERAL:  75 y.o.-year-old Caucasian female patient lying in the bed with no acute distress.  EYES: Pupils equal, round, reactive to light and accommodation. No scleral icterus. Extraocular muscles intact.  HEENT: Head atraumatic, normocephalic. Oropharynx and nasopharynx clear.  NECK:  Supple, no jugular venous distention. No thyroid enlargement, no tenderness.  LUNGS: Diminished bibasilar breath sounds with bibasilar rales.  No use of accessory muscles of respiration.  CARDIOVASCULAR:  Irregularly irregular rhythm, S1, S2 normal. No murmurs, rubs, or gallops.  ABDOMEN: Soft, nondistended, nontender. Bowel sounds present. No organomegaly or mass.  EXTREMITIES: No pedal edema, cyanosis, or clubbing.  NEUROLOGIC: Cranial nerves II through XII are intact. Muscle strength 5/5 in all extremities. Sensation intact. Gait not checked.  PSYCHIATRIC: The patient is alert and oriented x 3.  Normal affect and good eye contact. SKIN: No obvious rash, lesion, or ulcer.   LABORATORY PANEL:   CBC Recent Labs  Lab 02/06/21 1938  WBC 12.8*  HGB 14.2  HCT 43.7  PLT 187   ------------------------------------------------------------------------------------------------------------------  Chemistries  Recent Labs  Lab 02/06/21 1938 02/06/21 2016  NA 139  --   K 3.0*  --   CL 104  --   CO2 24  --   GLUCOSE 98  --   BUN 11  --   CREATININE 0.59  --   CALCIUM 9.1  --  MG  --  2.1  AST  --  18  ALT  --  14  ALKPHOS  --  101  BILITOT  --  1.6*   ------------------------------------------------------------------------------------------------------------------  Cardiac Enzymes No results for input(s): TROPONINI in the last 168 hours. ------------------------------------------------------------------------------------------------------------------  RADIOLOGY:  DG Chest 2 View  Result Date: 02/06/2021 CLINICAL DATA:  A 75 year old female presents for evaluation of cough and weakness. EXAM: CHEST - 2 VIEW COMPARISON:  December 24, 2018. FINDINGS: Cardiomediastinal contours remain enlarged. Hilar structures are stable. No lobar consolidative process. No visible pneumothorax. Small RIGHT-sided pleural effusion and likely small LEFT-sided pleural effusion. Mild interstitial prominence without gross edema. On limited assessment no acute skeletal process. IMPRESSION: Small RIGHT greater than LEFT pleural effusions. Cardiomegaly with mild interstitial prominence, consider correlation with  any signs of heart failure or volume overload. Electronically Signed   By: Zetta Bills M.D.   On: 02/06/2021 20:24      IMPRESSION AND PLAN:  Principal Problem:   Acute CHF (congestive heart failure) ([place])  1.  Acute on chronic systolic CHF. - The patient will be admitted to a cardiac telemetry bed. - We will continue diuresis with IV Lasix. - The patient had 2D echo most recently in 2020 with EF of 40 to 45% with mild LAD, moderate RAD, mitral TR and trace PR. - We will repeat a 2D echo for further assessment. - Cardiology consult will be obtained. - I notified Dr. Rayann Heman about patient.  2.  Atrial fibrillation with rapid ventricular response. - The patient responded to IV Cardizem bolus and was followed by p.o. Cardizem CD. - We will continue Toprol-XL - We will continue to monitor heart rate. - We will continue Eliquis.  3.  Essential hypertension. - We will continue Zestril and Toprol-XL.  4.  Dyslipidemia. - Continue Crestor   DVT prophylaxis: Eliquis. Code Status: This was discussed with the patient and her husband.  She has been out of facility DNR form.  She is DNR/DNI. Family Communication:  The plan of care was discussed in details with the patient (and f her husband was with her in the room). I answered all questions. The patient agreed to proceed with the above mentioned plan. Further management will depend upon hospital course. Disposition Plan: Back to previous home environment Consults called: Cardiology All the records are reviewed and case discussed with ED provider.  Status is: Inpatient    Remains inpatient appropriate because:Ongoing diagnostic testing needed not appropriate for outpatient work up, Unsafe d/c plan, IV treatments appropriate due to intensity of illness or inability to take PO, and Inpatient level of care appropriate due to severity of illness   Dispo: The patient is from: Home              Anticipated d/c is to: Home               Patient currently is not medically stable to d/c.              Difficult to place patient: No  TOTAL TIME TAKING CARE OF THIS PATIENT: 55 minutes.     Christel Mormon M.D on 02/07/2021 at 1:18 AM  [facility]   From 7 PM-7 AM, contact night-coverage [email or web address]  CC: Primary care physician; Charlynne Cousins, MD

## 2021-02-07 NOTE — ED Provider Notes (Signed)
[facility] Emergency Department Provider Note  ____________________________________________   Event Date/Time   First MD Initiated Contact with Patient 02/07/21 0115     (approximate)  I have reviewed the triage vital signs and the nursing notes.   HISTORY  Chief Complaint Weakness    HPI Norma Hill is a 75 y.o. female with medical history as listed below which notably includes chronic atrial fibrillation.  Her cardiologist is at [facility].  Her husband provides most of the history and he states that she is on diuretics but I do not see any diuretics listed in her medication list.  He also states that she has a history of heart failure but I do not see a listing of heart failure at her [facility] cardiology summary.  The patient presents for gradually worsening generalized weakness, cough, shortness of breath over the last several days.  She said that exertion makes the shortness of breath much worse and she has been having a thick sounding cough.  She denies fever, sore throat, chest pain, nausea, vomiting, and abdominal pain.  However she feels like her abdomen is distended.  She has not had any swelling in her legs.  She has not been feeling palpitations.  She has not had much appetite over the last couple of days and has been getting gradually weaker and is having a hard time getting around, partially because of the weakness and partially because of the shortness of breath.  Symptoms are severe and nothing in particular is helping.     Past Medical History:  Diagnosis Date   A-fib [facility])    Bowel trouble 2012   ocasionally   Cancer [facility]) 10/2000   pt had 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 Pt then had a repeat excision on 11/16/2000   Hypertension 2006   Personal history of malignant neoplasm of breast 2002   Solitary cyst of breast 2012   Special screening for malignant neoplasms, colon     Patient  Active Problem List   Diagnosis Date Noted   Acute CHF (congestive heart failure) ([place]) 02/07/2021   Bleeding 04/13/2020   Frequent bowel movements 04/13/2020   At high risk for falls 27/08/2374   Systolic heart failure ([place]) 03/10/2019   Elevated LDL cholesterol level 03/10/2019   Dementia ([place]) 12/19/2018   History of stroke 12/19/2018   Atrial fibrillation with RVR ([place]) 09/29/2018   Essential hypertension 02/22/2016   Personal history of malignant neoplasm of breast     Past Surgical History:  Procedure Laterality Date   BREAST BIOPSY Right 2002   BREAST SURGERY Right 2002   wide excision and sn x4 done 10/29/00 with repeat excision done on 11/16/2000 with post operative radiation treatment and 5 years of tamoxifen with completion in 2007   CHOLECYSTECTOMY  2002   COLONOSCOPY  2008   DILATION AND CURETTAGE OF UTERUS  1990,s   ELECTROPHYSIOLOGIC STUDY N/A 03/16/2016   Procedure: CARDIOVERSION;  Surgeon: Isaias Cowman, MD;  Location: [facility] ORS;  Service: Cardiovascular;  Laterality: N/A;    Prior to Admission medications   Medication Sig Start Date End Date Taking? Authorizing Provider  acetaminophen (TYLENOL) 325 MG tablet Take 2 tablets (650 mg total) by mouth every 6 (six) hours as needed for mild pain (or Fever >/= 101). 12/20/18  Yes Wieting, Richard, MD  apixaban (ELIQUIS) 5 MG TABS tablet Take 1 tablet (5 mg total) by mouth 2 (two) times daily. 04/13/20  Yes  Marnee Guarneri T, NP  aspirin EC 81 MG EC tablet Take 1 tablet (81 mg total) by mouth daily. 12/10/18  Yes Dustin Flock, MD  diltiazem (CARDIZEM CD) 180 MG 24 hr capsule Take 1 capsule (180 mg total) by mouth 2 (two) times daily. 04/13/20  Yes Cannady, Jolene T, NP  diphenhydramine-acetaminophen (TYLENOL PM EXTRA STRENGTH) 25-500 MG TABS tablet Take 1 tablet by mouth at bedtime as needed.   Yes [provider]  lisinopril (ZESTRIL) 5 MG tablet Take 1 tablet by mouth daily. 02/07/18  Yes [provider]   metoprolol succinate (TOPROL-XL) 25 MG 24 hr tablet Take 1 tablet (25 mg total) by mouth daily. 04/13/20  Yes Cannady, Jolene T, NP  rosuvastatin (CRESTOR) 10 MG tablet Take 1 tablet (10 mg total) by mouth daily. 04/13/20  Yes Cannady, Henrine Screws T, NP  flecainide (TAMBOCOR) 50 MG tablet Take 1 tablet by mouth 2 (two) times daily. Patient not taking: Reported on 02/07/2021 01/25/18   [provider]  polyethylene glycol (MIRALAX / GLYCOLAX) 17 g packet Take 17 g by mouth 2 (two) times daily. Patient not taking: Reported on 04/27/2020 12/20/18   Loletha Grayer, MD  senna (SENOKOT) 8.6 MG TABS tablet Take 2 tablets by mouth 2 (two) times daily. Patient not taking: Reported on 04/27/2020    [provider]  tamsulosin (FLOMAX) 0.4 MG CAPS capsule Take 1 capsule (0.4 mg total) by mouth daily. Patient not taking: Reported on 02/07/2021 04/13/20   Venita Lick, NP    Allergies Patient has no known allergies.  Family History  Problem Relation Age of Onset   Cancer Other        breast, relationship not listed   Dementia Mother    Diabetes Brother     Social History Social History   Tobacco Use   Smoking status: Never   Smokeless tobacco: Never  Vaping Use   Vaping Use: Never used  Substance Use Topics   Alcohol use: No   Drug use: No    Review of Systems Constitutional: No fever/chills.  General malaise and weakness. Eyes: No visual changes. ENT: No sore throat. Cardiovascular: Denies chest pain. Respiratory: Worsening shortness of breath particularly with exertion.  Thick sounding cough. Gastrointestinal: No abdominal pain but positive for abdominal swelling.  No nausea, no vomiting.  No diarrhea.  No constipation. Genitourinary: Negative for dysuria. Musculoskeletal: Negative for neck pain.  Negative for back pain. Integumentary: Negative for rash. Neurological: Negative for headaches, focal weakness or  numbness.   ____________________________________________   PHYSICAL EXAM:  VITAL SIGNS: ED Triage Vitals  Enc Vitals Group     BP 02/06/21 1918 (!) 182/98     Pulse Rate 02/06/21 1918 (!) 101     Resp 02/06/21 1918 18     Temp 02/06/21 1918 97.9 F (36.6 C)     Temp Source 02/06/21 1918 Oral     SpO2 02/06/21 1918 95 %     Weight 02/06/21 1919 65.8 kg (145 lb)     Height 02/06/21 1919 1.575 m (5\' 2" )     Head Circumference --      Peak Flow --      Pain Score 02/06/21 1918 0     Pain Loc --      Pain Edu? --      Excl. in [place]? --     Constitutional: Alert and oriented but seems a bit confused.  Husband seems to indicate this is her baseline. Eyes: Conjunctivae are  normal.  Head: Atraumatic. Nose: No congestion/rhinnorhea. Mouth/Throat: Patient is wearing a mask. Neck: No stridor.  No meningeal signs.   Cardiovascular: Tachycardia with irregularly irregular rhythm consistent with A. fib with RVR. Good peripheral circulation. Respiratory: Normal respiratory effort but with some tachypnea.  No retractions.  Frequent coarse cough.  Coarse breath sounds throughout.  No wheezing. Gastrointestinal: Soft and nontender but with some evidence of distention, possibly consistent with anasarca. Musculoskeletal: No lower extremity tenderness nor edema. No gross deformities of extremities. Neurologic:  Normal speech and language. No gross focal neurologic deficits are appreciated.  Skin:  Skin is warm, dry and intact. Psychiatric: Mood and affect are normal. Speech and behavior are normal.  ____________________________________________   LABS (all labs ordered are listed, but only abnormal results are displayed)  Labs Reviewed  BASIC METABOLIC PANEL - Abnormal; Notable for the following components:      Result Value   Potassium 3.0 (*)    All other components within normal limits  CBC - Abnormal; Notable for the following components:   WBC 12.8 (*)    All other components within  normal limits  HEPATIC FUNCTION PANEL - Abnormal; Notable for the following components:   Total Bilirubin 1.6 (*)    Bilirubin, Direct 0.4 (*)    Indirect Bilirubin 1.2 (*)    All other components within normal limits  RESP PANEL BY RT-PCR (FLU A&B, COVID) ARPGX2  TSH  MAGNESIUM  LIPASE, BLOOD  URINALYSIS, ROUTINE W REFLEX MICROSCOPIC  BASIC METABOLIC PANEL  CBC  TROPONIN I (HIGH SENSITIVITY)  TROPONIN I (HIGH SENSITIVITY)   ____________________________________________  EKG  ED ECG REPORT I, Hinda Kehr, the attending physician, personally viewed and interpreted this ECG.  Date: 02/07/2021 EKG Time: 00: 33 Rate: 150 Rhythm: A. fib with RVR QRS Axis: normal Intervals: normal ST/T Wave abnormalities: There is some mild ST depression globally consistent with rate related ischemia Narrative Interpretation: Probable rate related ischemia; does not meet STEMI criteria.  ____________________________________________  RADIOLOGY I, Hinda Kehr, personally viewed and evaluated these images (plain radiographs) as part of my medical decision making, as well as reviewing the written report by the radiologist.  ED MD interpretation: Small bilateral pleural effusions with cardiomegaly and pulmonary vascular congestion.  Official radiology report(s): DG Chest 2 View  Result Date: 02/06/2021 CLINICAL DATA:  A 75 year old female presents for evaluation of cough and weakness. EXAM: CHEST - 2 VIEW COMPARISON:  December 24, 2018. FINDINGS: Cardiomediastinal contours remain enlarged. Hilar structures are stable. No lobar consolidative process. No visible pneumothorax. Small RIGHT-sided pleural effusion and likely small LEFT-sided pleural effusion. Mild interstitial prominence without gross edema. On limited assessment no acute skeletal process. IMPRESSION: Small RIGHT greater than LEFT pleural effusions. Cardiomegaly with mild interstitial prominence, consider correlation with any signs of heart  failure or volume overload. Electronically Signed   By: Zetta Bills M.D.   On: 02/06/2021 20:24    ____________________________________________   PROCEDURES   Procedure(s) performed (including Critical Care):  .1-3 Lead EKG Interpretation Performed by: Hinda Kehr, MD Authorized by: Hinda Kehr, MD     Interpretation: abnormal     ECG rate:  152   ECG rate assessment: tachycardic     Rhythm: atrial fibrillation     Ectopy: none     Conduction: normal   .Critical Care Performed by: Hinda Kehr, MD Authorized by: Hinda Kehr, MD   Critical care provider statement:    Critical care time (minutes):  30   Critical care time was  exclusive of:  Separately billable procedures and treating other patients   Critical care was necessary to treat or prevent imminent or life-threatening deterioration of the following conditions:  Cardiac failure   Critical care was time spent personally by me on the following activities:  Development of treatment plan with patient or surrogate, evaluation of patient's response to treatment, examination of patient, obtaining history from patient or surrogate, ordering and performing treatments and interventions, ordering and review of laboratory studies, ordering and review of radiographic studies, pulse oximetry, re-evaluation of patient's condition and review of old charts   ____________________________________________   INITIAL IMPRESSION / MDM / [place] / ED COURSE  As part of my medical decision making, I reviewed the following data within the [place] notes reviewed and incorporated, Labs reviewed , EKG interpreted , Old chart reviewed, Radiograph reviewed , Discussed with admitting physician (Dr. Sidney Ace), and reviewed Notes from prior ED visits   Differential diagnosis includes, but is not limited to, A. fib with RVR, new onset CHF, ACS.  No sign of infectious process.  The patient is on the cardiac  monitor to evaluate for evidence of arrhythmia and/or significant heart rate changes.  Patient's symptoms are most consistent with uncontrolled rate in the setting of chronic A. fib which is led to some pulmonary vascular congestion/CHF.  I treated with diltiazem 15 mg IV which brought her rate down to about 100 and I followed with diltiazem 120 mg by mouth.  Patient remained hemodynamically stable with appropriate blood pressure.  Given her evidence of volume overload including possible anasarca with abdominal distention and pulmonary vascular congestion with pleural effusions on chest x-ray, I ordered furosemide 20 mg IV.  I discussed with the patient and her husband plan for admission for cardiology consult and continued treatment and they agree with the plan.  Lab results are generally reassuring with a high-sensitivity troponin within normal limits, normal respiratory viral panel, CBC with a mild leukocytosis of 12.8 which is nonspecific, basic metabolic panel with a decreased sodium of 3.0, otherwise unremarkable.  Magnesium is within normal limits.  Hepatic function panel is essentially normal.      Clinical Course as of 02/07/21 2409  [facility] Feb 07, 2021  0120 Discussed case with Dr. Sidney Ace who will admit. [CF]    Clinical Course User Index [CF] Hinda Kehr, MD     ____________________________________________  FINAL CLINICAL IMPRESSION(S) / ED DIAGNOSES  Final diagnoses:  Atrial fibrillation with RVR ([place])  Pulmonary vascular congestion  Exertional dyspnea  Generalized weakness     MEDICATIONS GIVEN DURING THIS VISIT:  Medications  aspirin EC tablet 81 mg (has no administration in time range)  diltiazem (CARDIZEM CD) 24 hr capsule 180 mg (has no administration in time range)  lisinopril (ZESTRIL) tablet 5 mg (has no administration in time range)  metoprolol succinate (TOPROL-XL) 24 hr tablet 25 mg (has no administration in time range)  rosuvastatin (CRESTOR) tablet 10 mg  (has no administration in time range)  apixaban (ELIQUIS) tablet 5 mg (has no administration in time range)  furosemide (LASIX) injection 20 mg (has no administration in time range)  acetaminophen (TYLENOL) tablet 650 mg (has no administration in time range)    Or  acetaminophen (TYLENOL) suppository 650 mg (has no administration in time range)  traZODone (DESYREL) tablet 25 mg (has no administration in time range)  magnesium hydroxide (MILK OF MAGNESIA) suspension 30 mL (has no administration in time range)  ondansetron (ZOFRAN) tablet 4  mg (has no administration in time range)    Or  ondansetron (ZOFRAN) injection 4 mg (has no administration in time range)  diltiazem (CARDIZEM) injection 15 mg (15 mg Intravenous Given 02/07/21 0055)  diltiazem (CARDIZEM) tablet 120 mg (120 mg Oral Given 02/07/21 0110)  furosemide (LASIX) injection 20 mg (20 mg Intravenous Given 02/07/21 0136)     ED Discharge Orders     None        Note:  This document was prepared using Dragon voice recognition software and may include unintentional dictation errors.   Hinda Kehr, MD 02/07/21 [phone]

## 2021-02-07 NOTE — Consult Note (Signed)
[facility] Consultation Note  Patient ID: Norma Hill, MRN: [id number], DOB/AGE: 08-22-1945 75 y.o. Admit date: 02/07/2021   Date of Consult: 02/07/2021 Primary Physician: Guadalupe Maple, MD Primary Cardiologist: Dr. Dimas [facility]   Chief Complaint:  Chief Complaint  Patient presents with   Weakness   Reason for Consult:  Atrial fibrillation with RVR  HPI: 75 y.o. female with a past medical history of atrial fibrillation anticoagulated on Eliquis, heart failure with preserved ejection fraction, hypertension, history of CVA in 2020 who presented to the ED on 12/5 with chief complaint of progressively worsening shortness of breath and generalized weakness. She is accompanied by her husband who is the primary historian during the encounter. In the ER patient was started on IV Cardizem and is now been transitioned to p.o. Cardizem and metoprolol at her home dose and diuresed with IV Lasix.  She states she is feeling a little better compared to her initial presentation in the ER.  Patient and her husband denies any significant events prior to admission. Patient's husband reports compliance to anticoagulation with Eliquis 5 mg twice daily.  Patient's husband reports the patient has had 2 prior ablations with most recent being in 2018.  He does not believe the patient has had any prior cardioversions.  It does not appear the patient seen a cardiologist since 2019.  Her last EKG revealed atrial fibrillation with rapid ventricular response with a rate of 150 bpm.  Echocardiogram performed today revealed LVEF 60-65% with mild dilation of the right and left atria.  Past Medical History:  Diagnosis Date   A-fib [facility])    Bowel trouble 2012   ocasionally   Cancer [facility]) 10/2000   pt had 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 Pt then had a repeat excision on 11/16/2000   Hypertension 2006   Personal history of malignant neoplasm  of breast 2002   Solitary cyst of breast 2012   Special screening for malignant neoplasms, colon       Surgical History:  Past Surgical History:  Procedure Laterality Date   BREAST BIOPSY Right 2002   BREAST SURGERY Right 2002   wide excision and sn x4 done 10/29/00 with repeat excision done on 11/16/2000 with post operative radiation treatment and 5 years of tamoxifen with completion in 2007   CHOLECYSTECTOMY  2002   COLONOSCOPY  2008   DILATION AND CURETTAGE OF UTERUS  1990,s   ELECTROPHYSIOLOGIC STUDY N/A 03/16/2016   Procedure: CARDIOVERSION;  Surgeon: Isaias Cowman, MD;  Location: [facility] ORS;  Service: Cardiovascular;  Laterality: N/A;     Home Meds: Prior to Admission medications   Medication Sig Start Date End Date Taking? Authorizing Provider  acetaminophen (TYLENOL) 325 MG tablet Take 2 tablets (650 mg total) by mouth every 6 (six) hours as needed for mild pain (or Fever >/= 101). 12/20/18  Yes Wieting, Richard, MD  apixaban (ELIQUIS) 5 MG TABS tablet Take 1 tablet (5 mg total) by mouth 2 (two) times daily. 04/13/20  Yes Cannady, Henrine Screws T, NP  aspirin EC 81 MG EC tablet Take 1 tablet (81 mg total) by mouth daily. 12/10/18  Yes Dustin Flock, MD  diltiazem (CARDIZEM CD) 180 MG 24 hr capsule Take 1 capsule (180 mg total) by mouth 2 (two) times daily. 04/13/20  Yes Cannady, Jolene T, NP  diphenhydramine-acetaminophen (TYLENOL PM EXTRA STRENGTH) 25-500 MG TABS tablet Take 1 tablet by mouth at bedtime as needed.   Yes [provider]  lisinopril (ZESTRIL) 5 MG tablet Take 1 tablet by mouth daily. 02/07/18  Yes [provider]  metoprolol succinate (TOPROL-XL) 25 MG 24 hr tablet Take 1 tablet (25 mg total) by mouth daily. 04/13/20  Yes Cannady, Jolene T, NP  rosuvastatin (CRESTOR) 10 MG tablet Take 1 tablet (10 mg total) by mouth daily. 04/13/20  Yes Cannady, Henrine Screws T, NP  flecainide (TAMBOCOR) 50 MG tablet Take 1 tablet by mouth 2 (two) times daily. Patient not taking:  Reported on 02/07/2021 01/25/18   [provider]  polyethylene glycol (MIRALAX / GLYCOLAX) 17 g packet Take 17 g by mouth 2 (two) times daily. Patient not taking: Reported on 04/27/2020 12/20/18   Loletha Grayer, MD  senna (SENOKOT) 8.6 MG TABS tablet Take 2 tablets by mouth 2 (two) times daily. Patient not taking: Reported on 04/27/2020    [provider]  tamsulosin (FLOMAX) 0.4 MG CAPS capsule Take 1 capsule (0.4 mg total) by mouth daily. Patient not taking: Reported on 02/07/2021 04/13/20   Marnee Guarneri T, NP    Inpatient Medications:   apixaban  5 mg Oral BID   aspirin EC  81 mg Oral Daily   diltiazem  180 mg Oral BID   furosemide  20 mg Intravenous Q12H   lisinopril  5 mg Oral Daily   metoprolol succinate  25 mg Oral Daily   rosuvastatin  10 mg Oral Daily    amiodarone 60 mg/hr (02/07/21 1513)   Followed by   amiodarone      Allergies: No Known Allergies  Social History   Socioeconomic History   Marital status: Married    Spouse name: Not on file   Number of children: Not on file   Years of education: Not on file   Highest education level: Not on file  Occupational History   Not on file  Tobacco Use   Smoking status: Never   Smokeless tobacco: Never  Vaping Use   Vaping Use: Never used  Substance and Sexual Activity   Alcohol use: No   Drug use: No   Sexual activity: Not on file  Other Topics Concern   Not on file  Social History Narrative   Not on file   Social Determinants of Health   Financial Resource Strain: Not on file  Food Insecurity: Not on file  Transportation Needs: Not on file  Physical Activity: Not on file  Stress: Not on file  Social Connections: Not on file  Intimate Partner Violence: Not on file     Family History  Problem Relation Age of Onset   Cancer Other        breast, relationship not listed   Dementia Mother    Diabetes Brother      Review of Systems Positive for weakness, shortness of breath which is  improving. Negative for: General:  chills, fever, night sweats or weight changes.  Cardiovascular: PND orthopnea syncope dizziness  Dermatological skin lesions rashes Respiratory: Cough congestion Urologic: Frequent urination urination at night and hematuria Abdominal: negative for nausea, vomiting, diarrhea, bright red blood per rectum, melena, or hematemesis Neurologic: negative for visual changes, and/or hearing changes  All other systems reviewed and are otherwise negative except as noted above.  Labs: No results for input(s): CKTOTAL, CKMB, TROPONINI in the last 72 hours. Lab Results  Component Value Date   WBC 13.9 (H) 02/07/2021   HGB 13.4 02/07/2021   HCT 41.1 02/07/2021   MCV 89.0 02/07/2021   PLT 194  02/07/2021    Recent Labs  Lab 02/06/21 2016 02/07/21 0710  NA  --  140  K  --  2.9*  CL  --  104  CO2  --  23  BUN  --  12  CREATININE  --  0.66  CALCIUM  --  8.7*  PROT 7.5  --   BILITOT 1.6*  --   ALKPHOS 101  --   ALT 14  --   AST 18  --   GLUCOSE  --  108*   Lab Results  Component Value Date   CHOL 176 04/13/2020   HDL 89 04/13/2020   LDLCALC 75 04/13/2020   TRIG 63 04/13/2020   No results found for: DDIMER  Radiology/Studies:  DG Chest 2 View  Result Date: 02/06/2021 CLINICAL DATA:  A 75 year old female presents for evaluation of cough and weakness. EXAM: CHEST - 2 VIEW COMPARISON:  December 24, 2018. FINDINGS: Cardiomediastinal contours remain enlarged. Hilar structures are stable. No lobar consolidative process. No visible pneumothorax. Small RIGHT-sided pleural effusion and likely small LEFT-sided pleural effusion. Mild interstitial prominence without gross edema. On limited assessment no acute skeletal process. IMPRESSION: Small RIGHT greater than LEFT pleural effusions. Cardiomegaly with mild interstitial prominence, consider correlation with any signs of heart failure or volume overload. Electronically Signed   By: Zetta Bills M.D.   On:  02/06/2021 20:24   ECHOCARDIOGRAM COMPLETE  Result Date: 02/07/2021    ECHOCARDIOGRAM REPORT   Patient Name:   Norma Hill Date of Exam: 02/07/2021 Medical Rec #:  [id number]          Height:       62.0 in Accession #:    [id number]         Weight:       145.0 lb Date of Birth:  1945/12/17          BSA:          1.667 m Patient Age:    1 years           BP:           123/84 mmHg Patient Gender: F                  HR:           75 bpm. Exam Location:  [facility] Procedure: 2D Echo, Color Doppler and Cardiac Doppler Indications:     I50.21 congestive heart failure-Acute Systolic  History:         Patient has prior history of Echocardiogram examinations.                  Arrythmias:Atrial Fibrillation, Signs/Symptoms:Shortness of                  Breath and Edema; Risk Factors:Hypertension.  Sonographer:     Charmayne Sheer Referring Phys:  [id number] [place] Diagnosing Phys: Serafina Royals MD  Sonographer Comments: Suboptimal apical window and no subcostal window. IMPRESSIONS  1. Left ventricular ejection fraction, by estimation, is 60 to 65%. The left ventricle has normal function. The left ventricle has no regional wall motion abnormalities. Left ventricular diastolic parameters were normal.  2. Right ventricular systolic function is normal. The right ventricular size is mildly enlarged.  3. Left atrial size was mildly dilated.  4. Right atrial size was mildly dilated.  5. The mitral valve is normal in structure. Trivial mitral valve regurgitation.  6. The aortic valve is normal in structure. Aortic valve regurgitation  is not visualized. FINDINGS  Left Ventricle: Left ventricular ejection fraction, by estimation, is 60 to 65%. The left ventricle has normal function. The left ventricle has no regional wall motion abnormalities. The left ventricular internal cavity size was normal in size. There is  no left ventricular hypertrophy. Left ventricular diastolic parameters were normal. Right Ventricle: The right  ventricular size is mildly enlarged. No increase in right ventricular wall thickness. Right ventricular systolic function is normal. Left Atrium: Left atrial size was mildly dilated. Right Atrium: Right atrial size was mildly dilated. Pericardium: There is no evidence of pericardial effusion. Mitral Valve: The mitral valve is normal in structure. Trivial mitral valve regurgitation. Tricuspid Valve: The tricuspid valve is normal in structure. Tricuspid valve regurgitation is mild. Aortic Valve: The aortic valve is normal in structure. Aortic valve regurgitation is not visualized. Aortic valve mean gradient measures 3.0 mmHg. Aortic valve peak gradient measures 5.7 mmHg. Aortic valve area, by VTI measures 2.53 cm. Pulmonic Valve: The pulmonic valve was normal in structure. Pulmonic valve regurgitation is trivial. Aorta: The aortic root and ascending aorta are structurally normal, with no evidence of dilitation. IAS/Shunts: No atrial level shunt detected by color flow Doppler.  LEFT VENTRICLE PLAX 2D LVIDd:         4.73 cm   Diastology LVIDs:         3.83 cm   LV e' medial:    12.30 cm/s LV PW:         0.94 cm   LV E/e' medial:  6.1 LV IVS:        0.81 cm   LV e' lateral:   13.30 cm/s LVOT diam:     2.10 cm   LV E/e' lateral: 5.7 LV SV:         54 LV SV Index:   33 LVOT Area:     3.46 cm  LEFT ATRIUM             Index LA diam:        4.70 cm 2.82 cm/m LA Vol (A2C):   52.0 ml 31.18 ml/m LA Vol (A4C):   95.2 ml 57.09 ml/m LA Biplane Vol: 75.7 ml 45.40 ml/m  AORTIC VALVE                    PULMONIC VALVE AV Area (Vmax):    2.20 cm     PV Vmax:       0.76 m/s AV Area (Vmean):   2.35 cm     PV Vmean:      48.200 cm/s AV Area (VTI):     2.53 cm     PV VTI:        0.107 m AV Vmax:           119.00 cm/s  PV Peak grad:  2.3 mmHg AV Vmean:          79.600 cm/s  PV Mean grad:  1.0 mmHg AV VTI:            0.215 m AV Peak Grad:      5.7 mmHg AV Mean Grad:      3.0 mmHg LVOT Vmax:         75.70 cm/s LVOT Vmean:         53.900 cm/s LVOT VTI:          0.157 m LVOT/AV VTI ratio: 0.73  AORTA Ao Root diam: 3.10 cm MITRAL VALVE  TRICUSPID VALVE MV Area (PHT): 5.57 cm    TR Peak grad:   26.0 mmHg MV Decel Time: 136 msec    TR Vmax:        255.00 cm/s MV E velocity: 75.42 cm/s MV A velocity: 39.40 cm/s  SHUNTS MV E/A ratio:  1.91        Systemic VTI:  0.16 m                            Systemic Diam: 2.10 cm Serafina Royals MD Electronically signed by Serafina Royals MD Signature Date/Time: 02/07/2021/1:30:21 PM    Final     EKG: Atrial fibrillation with rapid ventricular response, rate 150 bpm  Weights: Filed Weights   02/06/21 1919  Weight: 65.8 kg     Physical Exam: Blood pressure 123/76, pulse 75, temperature 98.6 F (37 C), temperature source Oral, resp. rate (!) 23, height 5\' 2"  (1.575 m), weight 65.8 kg, SpO2 95 %. Body mass index is 26.52 kg/m. General: Well developed, well nourished, in no acute distress. Head eyes ears nose throat: Normocephalic, atraumatic, sclera non-icteric, no xanthomas, nares are without discharge. No apparent thyromegaly and/or mass  Lungs: Normal respiratory effort.  no wheezes, no rales, no rhonchi.  Heart: Irregular irregular rate and rhythm with normal S1 S2. no murmur gallop, no rub, PMI is normal size and placement, carotid upstroke normal without bruit, jugular venous pressure is normal Abdomen: Soft, non-tender, non-distended with normoactive bowel sounds. No hepatomegaly. No rebound/guarding. No obvious abdominal masses. Abdominal aorta is normal size without bruit Extremities: Trace bilateral edema. no cyanosis, no clubbing, no ulcers  Peripheral : 2+ bilateral upper extremity pulses, 2+ bilateral femoral pulses, 2+ bilateral dorsal pedal pulse Neuro: Alert and oriented. No facial asymmetry. No focal deficit. Moves all extremities spontaneously. Musculoskeletal: Normal muscle tone without kyphosis Psych:  Responds to questions appropriately with a normal affect.     Assessment: 75 year old female with a atrial fibrillation anticoagulated on Eliquis, heart failure with preserved ejection fraction, hypertension, history of CVA in 2020 who presented to the ED on 12/5 with chief complaint of progressively worsening shortness of breath and generalized weakness.  Plan: #Atrial fibrillation with RVR -Plan to start patient on IV amiodarone in preparation for electrical cardioversion tomorrow morning.  -Continue anticoagulation with Eliquis -Continue Cardizem and metoprolol -Discussed with patient and her husband possible risks of electrical cardioversion being death, stroke, MI, side effects of anesthesia. She is at low risk for general anesthesia   #Heart failure with preserved ejection fraction -Echocardiogram today revealed estimated LVEF 60-65%.  Its likely her atrial fibrillation is contributing to her heart failure symptoms. -Continue diuresis with Lasix  #Hypertension -Continue lisinopril  #Dyslipidemia - continue crestor   #Hypokalemia -Replete and monitor BMP   Signed, Corey Skains M.D. [facility] 02/07/2021, 4:24 PM

## 2021-02-07 NOTE — ED Notes (Signed)
Pt. Alert and oriented, warm and dry, in no distress. Encouraged to let nursing staff know of any concerns or needs.  

## 2021-02-07 NOTE — TOC Initial Note (Signed)
Transition of Care [facility]) - Initial/Assessment Note    Patient Details  Name: Norma Hill MRN: [id number] Date of Birth: 12-May-1945  Transition of Care [facility]) CM/SW Contact:    Shelbie Hutching, RN Phone Number: 02/07/2021, 11:01 AM  Clinical Narrative:                 RNCM met with patient at the bedside down in the emergency department, patient waiting on cardiac floor bed.  Husband is at the bedside.  RNCM explained role in transitions of care.  Patient is from home with [place] husband, they live in [place].  Patient is pretty independent with ADL's at home and requires no DME, husband reports himself as caregiver, he provides transportation as patient does not drive.  She is current with her PCP over at [facility] in [place].  No trouble obtaining prescriptions from [facility].   Patient has had a stroke in the past and was at [facility] in [place] for a short time.  Husband took her home with home health but was not impressed by the home health agency.   He would be agreeable to home health again if they did a better job than before- he cannot remember the name of the company.     Expected Discharge Plan: [place] Barriers to Discharge: Continued Medical Work up   Patient Goals and CMS Choice Patient states their goals for this hospitalization and ongoing recovery are:: Patient and husband just want to find out what is going on, husband wants his wife better and back at home CMS Medicare.gov Compare Post Acute Care list provided to:: Patient Choice offered to / list presented to : Patient, Spouse  Expected Discharge Plan and Services Expected Discharge Plan: [place]   Discharge Planning Services: CM Consult Post Acute Care Choice: [place] arrangements for the past 2 months: Single Family Home                 DME Arranged: N/A DME Agency: NA                  Prior Living Arrangements/Services Living  arrangements for the past 2 months: Single Family Home Lives with:: Spouse Patient language and need for interpreter reviewed:: Yes Do you feel safe going back to the place where you live?: Yes      Need for Family Participation in Patient Care: Yes (Comment) Care giver support system in place?: Yes (comment) (husband)   Criminal Activity/Legal Involvement Pertinent to Current Situation/Hospitalization: No - Comment as needed  Activities of Daily Living      Permission Sought/Granted Permission sought to share information with : Case Manager, Family Supports Permission granted to share information with : Yes, Verbal Permission Granted  Share Information with NAME: Karilynn Carranza     Permission granted to share info w Relationship: Spouse  Permission granted to share info w Contact Information: [phone]  Emotional Assessment Appearance:: Appears stated age Attitude/Demeanor/Rapport: Engaged Affect (typically observed): Accepting Orientation: : Oriented to Self, Oriented to Place, Oriented to  Time, Oriented to Situation Alcohol / Substance Use: Not Applicable Psych Involvement: No (comment)  Admission diagnosis:  Acute CHF (congestive heart failure) (HCC) [I50.9] Patient Active Problem List   Diagnosis Date Noted   Acute CHF (congestive heart failure) ([place]) 02/07/2021   Bleeding 04/13/2020   Frequent bowel movements 04/13/2020   At high risk for falls 86/57/8469   Systolic heart failure ([place])  03/10/2019   Elevated LDL cholesterol level 03/10/2019   Dementia ([place]) 12/19/2018   History of stroke 12/19/2018   Atrial fibrillation with RVR ([place]) 09/29/2018   Essential hypertension 02/22/2016   Personal history of malignant neoplasm of breast    PCP:  Guadalupe Maple, MD Pharmacy:   [place], [place] - [address] [place] [address] Phone: [phone] Fax: [phone]     Social Determinants of Health (SDOH) Interventions     Readmission Risk Interventions No flowsheet data found.

## 2021-02-07 NOTE — Progress Notes (Signed)
*  PRELIMINARY RESULTS* Echocardiogram 2D Echocardiogram has been performed.  Norma Hill 02/07/2021, 1:06 PM

## 2021-02-07 NOTE — ED Notes (Signed)
Pt. Alert and oriented, warm and dry, in no distress. Pt. Encouraged to let nursing staff know of any concerns or needs.  

## 2021-02-07 NOTE — ED Notes (Signed)
1:1 safety sitter at bedside now.

## 2021-02-07 NOTE — Progress Notes (Signed)
PROGRESS NOTE  Norma Hill  DOB: Jul 07, 1945  PCP: Guadalupe Maple, MD VOH:[id number]  DOA: 02/07/2021  LOS: 0 days  Hospital Day: 1  Chief Complaint  Patient presents with   Weakness    Brief narrative: Norma Hill is a 75 y.o. female with PMH significant for HTN, A. fib, breast cancer. Patient presented to the ED on 12/4 with complaint of progressively worsening shortness of breath, dry cough, wheezing, bilateral lower edema, PND and generalized weakness.  In the ED, patient was afebrile, heart rate 101, blood pressure 182/98, breathing on room air Later in the ED, she went to A. fib with RVR, confirmed by EKG with heart rate up to 150s Labs with potassium low at 3, WBC slightly elevated 12.8 EKG showed A. fib with RVR, ST segment depression lateral leads Chest x-ray showed small bilateral pleural effusion, cardiomegaly, mild interstitial prominence concerning for heart failure and volume overload. Patient was started on IV Lasix, IV Cardizem drip and admitted to hospitalist service.  Subjective: Patient was seen and examined this morning.  Pleasant elderly Caucasian female.  Propped up in bed.  Not in distress.  Not on supplemental oxygen.  Husband at bedside. Feels better than at presentation. Chart reviewed Labs this morning with potassium further low at 2.9, WBC further up at 13.9 Urinalysis with clear yellow urine, large amount of leukocytes  Assessment/Plan: Acute exacerbation of chronic systolic CHF History of essential hypertension -Presented with progressively worsening shortness of breath, wheezing, bilateral lower extreme edema, PND  -Home meds include metoprolol, lisinopril.  I do not see any diuretics in his list. -Started on IV Lasix on admission.  Continue Lasix 20 mg IV twice daily at this time.  Continue metoprolol and lisinopril as well. -Most recent echo from 2022 with EF 40 to 45% -Cardiology consultation was called.  Repeat echo was  ordered. -Net IO Since Admission: No IO data has been entered for this period [02/07/21 1032] -Continue to monitor for daily intake output, weight, blood pressure, BNP, renal function and electrolytes. Recent Labs  Lab 02/06/21 1938 02/06/21 2016 02/07/21 0710  BUN 11  --  12  CREATININE 0.59  --  0.66  K 3.0*  --  2.9*  MG  --  2.1  --    A. fib with RVR -History of A. fib.  While in the ED, patient went to RVR.  IV Cardizem was given and was started on bolus. -Home meds include Cardizem 180 mg twice daily, Toprol 25 mg daily and anticoagulation with Eliquis 5 mg daily. -Continue all.  Hypokalemia -Potassium further low at 2.9 this morning.  In the setting of IV Lasix.  I would give KCl 40 mEq twice this morning.  Repeat potassium level tomorrow. Recent Labs  Lab 02/06/21 1938 02/06/21 2016 02/07/21 0710  K 3.0*  --  2.9*  MG  --  2.1  --    Dyslipidemia. -Continue Crestor   Mobility: Independent prior to presentation Living condition: Lives at home with husband. Goals of care:   Code Status: DNR per chart Nutritional status: Body mass index is 26.52 kg/m.      Diet:  Diet Order             Diet Heart Room service appropriate? Yes; Fluid consistency: Thin  Diet effective now                  DVT prophylaxis:   apixaban (ELIQUIS) tablet 5 mg   Antimicrobials: None Fluid:  None Consultants: Cardiology Family Communication: Husband at bedside  Status is: Inpatient  Continue in-hospital care because: Needs IV diuresis, echocardiogram, further inpatient monitoring Level of care: Telemetry Cardiac   Dispo: The patient is from: Home              Anticipated d/c is to: Hopefully home in 2 to 3 days              Patient currently is not medically stable to d/c.   Difficult to place patient No     Infusions:    Scheduled Meds:  apixaban  5 mg Oral BID   aspirin EC  81 mg Oral Daily   diltiazem  180 mg Oral BID   furosemide  20 mg Intravenous Q12H    lisinopril  5 mg Oral Daily   metoprolol succinate  25 mg Oral Daily   potassium chloride  40 mEq Oral Q2H   rosuvastatin  10 mg Oral Daily    PRN meds: acetaminophen **OR** acetaminophen, magnesium hydroxide, ondansetron **OR** ondansetron (ZOFRAN) IV, traZODone   Antimicrobials: Anti-infectives (From admission, onward)    None       Objective: Vitals:   02/07/21 0550 02/07/21 0830  BP: 137/66 133/69  Pulse: 87 87  Resp: 20 (!) 21  Temp:  98.6 F (37 C)  SpO2: 93% 93%   No intake or output data in the 24 hours ending 02/07/21 1032 Filed Weights   02/06/21 1919  Weight: 65.8 kg   Weight change:  Body mass index is 26.52 kg/m.   Physical Exam: General exam: Pleasant, elderly Caucasian female.  Not in visible distress Skin: No rashes, lesions or ulcers. HEENT: Atraumatic, normocephalic, no obvious bleeding Lungs: Clear to auscultation bilaterally CVS: Regular rate and rhythm, no murmur GI/Abd soft, nontender, nondistended, bowel sound present CNS: Alert, awake, oriented x3 Psychiatry: Mood appropriate Extremities: Improving bilateral pedal edema Data Review: I have personally reviewed the laboratory data and studies available.  F/u labs ordered Unresulted Labs (From admission, onward)     Start     Ordered   02/08/21 0500  CBC with Differential/Platelet  Daily,   STAT      02/07/21 1032   02/08/21 7048  Basic metabolic panel  Daily,   STAT      02/07/21 1032            Signed, Terrilee Croak, MD [facility] 02/07/2021

## 2021-02-07 NOTE — ED Notes (Signed)
Pt took of cardiac monitor 4x, this RN put monitor back on 4x. Pt's husband left 1 hour ago and now pt's starting to get out of bed. Safety sitter order placed, charge nurse notified.

## 2021-02-08 ENCOUNTER — Encounter: Payer: Self-pay | Admitting: Family Medicine

## 2021-02-08 DIAGNOSIS — I5021 Acute systolic (congestive) heart failure: Secondary | ICD-10-CM | POA: Diagnosis not present

## 2021-02-08 LAB — BASIC METABOLIC PANEL
Anion gap: 11 (ref 5–15)
BUN: 17 mg/dL (ref 8–23)
CO2: 25 mmol/L (ref 22–32)
Calcium: 9.1 mg/dL (ref 8.9–10.3)
Chloride: 102 mmol/L (ref 98–111)
Creatinine, Ser: 0.68 mg/dL (ref 0.44–1.00)
GFR, Estimated: >60 mL/min (ref >60)
Glucose, Bld: 122 mg/dL — ABNORMAL HIGH (ref 70–99)
Potassium: 3.4 mmol/L — ABNORMAL LOW (ref 3.5–5.1)
Sodium: 138 mmol/L (ref 135–145)

## 2021-02-08 LAB — CBC WITH DIFFERENTIAL/PLATELET
Abs Immature Granulocytes: 0.07 10*3/uL (ref 0.00–0.07)
Basophils Absolute: 0 10*3/uL (ref 0.0–0.1)
Basophils Relative: 0 %
Eosinophils Absolute: 0 10*3/uL (ref 0.0–0.5)
Eosinophils Relative: 0 %
HCT: 39.7 % (ref 36.0–46.0)
Hemoglobin: 12.6 g/dL (ref 12.0–15.0)
Immature Granulocytes: 1 %
Lymphocytes Relative: 9 %
Lymphs Abs: 1.1 10*3/uL (ref 0.7–4.0)
MCH: 29.3 pg (ref 26.0–34.0)
MCHC: 31.7 g/dL (ref 30.0–36.0)
MCV: 92.3 fL (ref 80.0–100.0)
Monocytes Absolute: 1.4 10*3/uL — ABNORMAL HIGH (ref 0.1–1.0)
Monocytes Relative: 11 %
Neutro Abs: 9.8 10*3/uL — ABNORMAL HIGH (ref 1.7–7.7)
Neutrophils Relative %: 79 %
Platelets: 207 10*3/uL (ref 150–400)
RBC: 4.3 MIL/uL (ref 3.87–5.11)
RDW: 13.8 % (ref 11.5–15.5)
WBC: 12.3 10*3/uL — ABNORMAL HIGH (ref 4.0–10.5)
nRBC: 0 % (ref 0.0–0.2)

## 2021-02-08 LAB — PROTIME-INR
INR: 1.2 (ref 0.8–1.2)
Prothrombin Time: 15.4 seconds — ABNORMAL HIGH (ref 11.4–15.2)

## 2021-02-08 MED ORDER — POTASSIUM CHLORIDE CRYS ER 20 MEQ PO TBCR
40.0000 meq | EXTENDED_RELEASE_TABLET | Freq: Once | ORAL | Status: AC
  Administered 2021-02-08: 40 meq via ORAL
  Filled 2021-02-08: qty 2

## 2021-02-08 NOTE — Progress Notes (Addendum)
[facility] Cardiology [facility] Encounter Note  Patient: Norma Hill / Admit Date: 02/07/2021 / Date of Encounter: 02/08/2021, 4:28 PM   Subjective: Patient states she is feeling better than yesterday.  Her husband is not present during interview as he went to get lunch however her physical therapist is present at bedside.  Unfortunately patient was unable to undergo cardioversion this morning as she had eaten something for breakfast.  Put orders in for n.p.o. after midnight tonight in preparation for the procedure tomorrow.   Review of Systems: Positive for: None Negative for: Vision change, hearing change, syncope, dizziness, nausea, vomiting,diarrhea, bloody stool, stomach pain, cough, congestion, diaphoresis, urinary frequency, urinary pain,skin lesions, skin rashes Others previously listed  Objective: Telemetry: Atrial fibrillation with ventricular rate of 94 bpm. Physical Exam: Blood pressure (!) 134/93, pulse 88, temperature 98 F (36.7 C), resp. rate 17, height 5\' 2"  (1.575 m), weight 71.2 kg, SpO2 (!) 88 %. Body mass index is 28.7 kg/m. General: Elderly appearing Caucasian female developed, well nourished, in no acute distress sitting upright in bed. Head: Normocephalic, atraumatic, sclera non-icteric, no xanthomas,  Neck: No apparent masses Lungs: Coarse breath sounds in all lung fields with no wheezes, no rhonchi, no rales   Heart: Irregularly irregular rate and rhythm, normal S1 S2, no murmur, no rub, no gallop,  Abdomen: Soft, non-distended appearing. No apparent hepatosplenomegaly.  Extremities: Trace bilateral lower extremity edema, no clubbing, no cyanosis, no ulcers,  Peripheral: 2+ radial, 2+ dorsal pedal pulses Neuro: Alert but disoriented.  Moves all extremities spontaneously. Psych:  Responds to questions appropriately with a normal affect.   Intake/Output Summary (Last 24 hours) at 02/08/2021 1628 Last data filed at 02/08/2021 1100 Gross per 24 hour   Intake 250 ml  Output --  Net 250 ml    Inpatient Medications:   apixaban  5 mg Oral BID   aspirin EC  81 mg Oral Daily   diltiazem  180 mg Oral BID   furosemide  20 mg Intravenous Q12H   lisinopril  5 mg Oral Daily   metoprolol succinate  25 mg Oral Daily   rosuvastatin  10 mg Oral Daily   Infusions:   sodium chloride     amiodarone 30 mg/hr (02/08/21 1101)    Labs: Recent Labs    02/06/21 2016 02/07/21 0710 02/08/21 0512  NA  --  140 138  K  --  2.9* 3.4*  CL  --  104 102  CO2  --  23 25  GLUCOSE  --  108* 122*  BUN  --  12 17  CREATININE  --  0.66 0.68  CALCIUM  --  8.7* 9.1  MG 2.1  --   --    Recent Labs    02/06/21 2016  AST 18  ALT 14  ALKPHOS 101  BILITOT 1.6*  PROT 7.5  ALBUMIN 4.2   Recent Labs    02/07/21 0710 02/08/21 0512  WBC 13.9* 12.3*  NEUTROABS  --  9.8*  HGB 13.4 12.6  HCT 41.1 39.7  MCV 89.0 92.3  PLT 194 207   No results for input(s): CKTOTAL, CKMB, TROPONINI in the last 72 hours. Invalid input(s): POCBNP No results for input(s): HGBA1C in the last 72 hours.   Weights: Filed Weights   02/06/21 1919 02/08/21 0500 02/08/21 0636  Weight: 65.8 kg 65 kg 71.2 kg     Radiology/Studies:  DG Chest 2 View  Result Date: 02/06/2021 CLINICAL DATA:  A 75 year old female presents for  evaluation of cough and weakness. EXAM: CHEST - 2 VIEW COMPARISON:  December 24, 2018. FINDINGS: Cardiomediastinal contours remain enlarged. Hilar structures are stable. No lobar consolidative process. No visible pneumothorax. Small RIGHT-sided pleural effusion and likely small LEFT-sided pleural effusion. Mild interstitial prominence without gross edema. On limited assessment no acute skeletal process. IMPRESSION: Small RIGHT greater than LEFT pleural effusions. Cardiomegaly with mild interstitial prominence, consider correlation with any signs of heart failure or volume overload. Electronically Signed   By: Zetta Bills M.D.   On: 02/06/2021 20:24    ECHOCARDIOGRAM COMPLETE  Result Date: 02/07/2021    ECHOCARDIOGRAM REPORT   Patient Name:   Norma Hill Brett Date of Exam: 02/07/2021 Medical Rec #:  [id number]          Height:       62.0 in Accession #:    [id number]         Weight:       145.0 lb Date of Birth:  October 07, 1945          BSA:          1.667 m Patient Age:    75 years           BP:           123/84 mmHg Patient Gender: F                  HR:           75 bpm. Exam Location:  [facility] Procedure: 2D Echo, Color Doppler and Cardiac Doppler Indications:     I50.21 congestive heart failure-Acute Systolic  History:         Patient has prior history of Echocardiogram examinations.                  Arrythmias:Atrial Fibrillation, Signs/Symptoms:Shortness of                  Breath and Edema; Risk Factors:Hypertension.  Sonographer:     Charmayne Sheer Referring Phys:  [id number] [place] Diagnosing Phys: Serafina Royals MD  Sonographer Comments: Suboptimal apical window and no subcostal window. IMPRESSIONS  1. Left ventricular ejection fraction, by estimation, is 60 to 65%. The left ventricle has normal function. The left ventricle has no regional wall motion abnormalities. Left ventricular diastolic parameters were normal.  2. Right ventricular systolic function is normal. The right ventricular size is mildly enlarged.  3. Left atrial size was mildly dilated.  4. Right atrial size was mildly dilated.  5. The mitral valve is normal in structure. Trivial mitral valve regurgitation.  6. The aortic valve is normal in structure. Aortic valve regurgitation is not visualized. FINDINGS  Left Ventricle: Left ventricular ejection fraction, by estimation, is 60 to 65%. The left ventricle has normal function. The left ventricle has no regional wall motion abnormalities. The left ventricular internal cavity size was normal in size. There is  no left ventricular hypertrophy. Left ventricular diastolic parameters were normal. Right Ventricle: The right ventricular size is mildly  enlarged. No increase in right ventricular wall thickness. Right ventricular systolic function is normal. Left Atrium: Left atrial size was mildly dilated. Right Atrium: Right atrial size was mildly dilated. Pericardium: There is no evidence of pericardial effusion. Mitral Valve: The mitral valve is normal in structure. Trivial mitral valve regurgitation. Tricuspid Valve: The tricuspid valve is normal in structure. Tricuspid valve regurgitation is mild. Aortic Valve: The aortic valve is normal in structure. Aortic valve regurgitation is not visualized.  Aortic valve mean gradient measures 3.0 mmHg. Aortic valve peak gradient measures 5.7 mmHg. Aortic valve area, by VTI measures 2.53 cm. Pulmonic Valve: The pulmonic valve was normal in structure. Pulmonic valve regurgitation is trivial. Aorta: The aortic root and ascending aorta are structurally normal, with no evidence of dilitation. IAS/Shunts: No atrial level shunt detected by color flow Doppler.  LEFT VENTRICLE PLAX 2D LVIDd:         4.73 cm   Diastology LVIDs:         3.83 cm   LV e' medial:    12.30 cm/s LV PW:         0.94 cm   LV E/e' medial:  6.1 LV IVS:        0.81 cm   LV e' lateral:   13.30 cm/s LVOT diam:     2.10 cm   LV E/e' lateral: 5.7 LV SV:         54 LV SV Index:   33 LVOT Area:     3.46 cm  LEFT ATRIUM             Index LA diam:        4.70 cm 2.82 cm/m LA Vol (A2C):   52.0 ml 31.18 ml/m LA Vol (A4C):   95.2 ml 57.09 ml/m LA Biplane Vol: 75.7 ml 45.40 ml/m  AORTIC VALVE                    PULMONIC VALVE AV Area (Vmax):    2.20 cm     PV Vmax:       0.76 m/s AV Area (Vmean):   2.35 cm     PV Vmean:      48.200 cm/s AV Area (VTI):     2.53 cm     PV VTI:        0.107 m AV Vmax:           119.00 cm/s  PV Peak grad:  2.3 mmHg AV Vmean:          79.600 cm/s  PV Mean grad:  1.0 mmHg AV VTI:            0.215 m AV Peak Grad:      5.7 mmHg AV Mean Grad:      3.0 mmHg LVOT Vmax:         75.70 cm/s LVOT Vmean:        53.900 cm/s LVOT VTI:           0.157 m LVOT/AV VTI ratio: 0.73  AORTA Ao Root diam: 3.10 cm MITRAL VALVE               TRICUSPID VALVE MV Area (PHT): 5.57 cm    TR Peak grad:   26.0 mmHg MV Decel Time: 136 msec    TR Vmax:        255.00 cm/s MV E velocity: 75.42 cm/s MV A velocity: 39.40 cm/s  SHUNTS MV E/A ratio:  1.91        Systemic VTI:  0.16 m                            Systemic Diam: 2.10 cm Serafina Royals MD Electronically signed by Serafina Royals MD Signature Date/Time: 02/07/2021/1:30:21 PM    Final      Assessment and Recommendation  75 y.o. female with a atrial fibrillation anticoagulated on Eliquis, heart failure with preserved ejection  fraction, hypertension, history of CVA in 2020 who presented to the ED on 12/5 with chief complaint of progressively worsening shortness of breath and generalized weakness.  Planning for electrical cardioversion tomorrow morning 02/09/2021 with Dr. Serafina Royals.  #Atrial fibrillation with RVR -Continue IV amiodarone in preparation for electrical cardioversion tomorrow morning.  Patient to be made n.p.o. at midnight. -Continue anticoagulation with Eliquis -Continue Cardizem and metoprolol   #Heart failure with preserved ejection fraction -Echocardiogram revealed estimated LVEF 60-65%.  Its likely her atrial fibrillation is contributing to her heart failure symptoms. -Continue diuresis with Lasix   #Hypertension -Continue lisinopril   #Dyslipidemia - continue crestor    #Hypokalemia -Potassium improved to 3.4 today  -Continue to replete and monitor BMP   Signed, Orinda Kenner, PA-C  The patient has been interviewed and examined. I agree with assessment and plan above. Serafina Royals MD [facility]

## 2021-02-08 NOTE — Evaluation (Signed)
Physical Therapy Evaluation Patient Details Name: Norma Hill MRN: [id number] DOB: 1945-03-29 Today's Date: 02/08/2021  History of Present Illness  pt is a 75 y.o. female with a past medical history of atrial fibrillation anticoagulated on Eliquis, heart failure with preserved ejection fraction, hypertension, history of CVA in 2020 who presented to the ED on 12/5 with chief complaint of progressively worsening shortness of breath and generalized weakness.   Clinical Impression  Patient alert, agreeable to PT, oriented x3 (not oriented to date), denied pain. Pt reported at baseline she is normally independent for ADLs and ambulation, denied any recent falls.   The patient was able to perform bed mobility with very light minA. Sat EOB for several minutes to discuss PLOF and allow for room set up, supervision. Several transfers performed, CGA with no AD. She was able to ambulate ~61ft total, CGA for safety. She did exhibit mild unsteadiness and decreased gait velocity, endorsed feeling a bit weaker than previously. Pt/family in agreement to utilize RW for longer distances and at discharge for now.  Overall the patient demonstrated deficits (see "PT Problem List") that impede the patient's functional abilities, safety, and mobility and would benefit from skilled PT intervention. Recommendation is HHPT with frequent/constant supervision/assistance, husband verbalized understanding.         Recommendations for follow up therapy are one component of a multi-disciplinary discharge planning process, led by the attending physician.  Recommendations may be updated based on patient status, additional functional criteria and insurance authorization.  Follow Up Recommendations Home health PT    Assistance Recommended at Discharge Frequent or constant Supervision/Assistance  Functional Status Assessment Patient has had a recent decline in their functional status and demonstrates the ability to make  significant improvements in function in a reasonable and predictable amount of time.  Equipment Recommendations  Rolling walker (2 wheels)    Recommendations for Other Services OT consult     Precautions / Restrictions Precautions Precautions: Fall Restrictions Weight Bearing Restrictions: No      Mobility  Bed Mobility Overal bed mobility: Needs Assistance Bed Mobility: Supine to Sit     Supine to sit: Min assist;HOB elevated          Transfers Overall transfer level: Needs assistance Equipment used: None Transfers: Sit to/from Stand;Bed to chair/wheelchair/BSC Sit to Stand: Min guard   Step pivot transfers: Min guard       General transfer comment: transferred from EOB to [facility], to chair in room CGA no AD    Ambulation/Gait Ambulation/Gait assistance: Min guard Gait Distance (Feet): 22 Feet Assistive device: None         General Gait Details: pt able to ambulate without AD, but did have mild gait path deviations and unsteadiness noted. RW recommended for longer distances, family/pt in agreement  Stairs            Wheelchair Mobility    Modified Rankin (Stroke Patients Only)       Balance Overall balance assessment: Needs assistance Sitting-balance support: Feet supported Sitting balance-Leahy Scale: Good       Standing balance-Leahy Scale: Good                               Pertinent Vitals/Pain Pain Assessment: No/denies pain    Home Living Family/patient expects to be discharged to:: Private residence Living Arrangements: Spouse/significant other Available Help at Discharge: Family;Available 24 hours/day Type of Home: House Home Access: Stairs to enter Entrance  Stairs-Rails: None Entrance Stairs-Number of Steps: chart says 1 step, pt says 3   Home Layout: One level Home Equipment: Shower seat - built in      Prior Function Prior Level of Function : Independent/Modified Independent             Mobility  Comments: per pt, independent without AD for mobility ADLs Comments: reported independence for ADLs. assist for all IADLs by husband     Hand Dominance   Dominant Hand: Right    Extremity/Trunk Assessment   Upper Extremity Assessment Upper Extremity Assessment: Overall WFL for tasks assessed    Lower Extremity Assessment Lower Extremity Assessment: Generalized weakness    Cervical / Trunk Assessment Cervical / Trunk Assessment: Normal  Communication   Communication: No difficulties  Cognition Arousal/Alertness: Awake/alert Behavior During Therapy: WFL for tasks assessed/performed;Flat affect                                   General Comments: family stated she is a brilliant mind. did have CVA, wasnt able to ascertain if current mentation is different from baseline, but certainly improved from earlier today. calm oriented x3 (disoriented to date)        General Comments      Exercises Other Exercises Other Exercises: pt did perform pericare with supervision, but PT concerned about UTI risk of infection. also unable to doff socks without assistance   Assessment/Plan    PT Assessment Patient needs continued PT services  PT Problem List Decreased strength;Decreased balance;Decreased activity tolerance;Cardiopulmonary status limiting activity       PT Treatment Interventions DME instruction;Therapeutic exercise;Gait training;Balance training;Stair training;Neuromuscular re-education;Functional mobility training;Therapeutic activities;Patient/family education    PT Goals (Current goals can be found in the Care Plan section)  Acute Rehab PT Goals Patient Stated Goal: to feel better PT Goal Formulation: With patient Time For Goal Achievement: 02/22/21 Potential to Achieve Goals: Good    Frequency Min 2X/week   Barriers to discharge        Co-evaluation               AM-PAC PT "6 Clicks" Mobility  Outcome Measure Help needed turning from  your back to your side while in a flat bed without using bedrails?: None Help needed moving from lying on your back to sitting on the side of a flat bed without using bedrails?: None Help needed moving to and from a bed to a chair (including a wheelchair)?: None Help needed standing up from a chair using your arms (e.g., wheelchair or bedside chair)?: None Help needed to walk in hospital room?: A Little Help needed climbing 3-5 steps with a railing? : A Little 6 Click Score: 22    End of Session Equipment Utilized During Treatment: Gait belt Activity Tolerance: Patient tolerated treatment well Patient left: in chair;with chair alarm set;with call bell/phone within reach;with family/visitor present Nurse Communication: Mobility status PT Visit Diagnosis: Other abnormalities of gait and mobility (R26.89);Difficulty in walking, not elsewhere classified (R26.2);Muscle weakness (generalized) (M62.81)    Time: 7169-6789 PT Time Calculation (min) (ACUTE ONLY): 36 min   Charges:   PT Evaluation $PT Eval Low Complexity: 1 Low PT Treatments $Therapeutic Activity: 23-37 mins        Lieutenant Diego PT, DPT 2:56 PM,02/08/21

## 2021-02-08 NOTE — Plan of Care (Signed)

## 2021-02-08 NOTE — Progress Notes (Signed)
PROGRESS NOTE  Norma Hill  DOB: 1945-10-03  PCP: Guadalupe Maple, MD YDX:[id number]  DOA: 02/07/2021  LOS: 1 day  Hospital Day: 2  Chief Complaint  Patient presents with   Weakness    Brief narrative: Norma Hill is a 75 y.o. female with PMH significant for HTN, A. fib, breast cancer. Patient presented to the ED on 12/4 with complaint of progressively worsening shortness of breath, dry cough, wheezing, bilateral lower edema, PND and generalized weakness.  In the ED, patient was afebrile, heart rate 101, blood pressure 182/98, breathing on room air Later in the ED, she went to A. fib with RVR, confirmed by EKG with heart rate up to 150s Labs with potassium low at 3, WBC slightly elevated 12.8 EKG showed A. fib with RVR, ST segment depression lateral leads Chest x-ray showed small bilateral pleural effusion, cardiomegaly, mild interstitial prominence concerning for heart failure and volume overload. Patient was started on IV Lasix, IV Cardizem drip and admitted to hospitalist service.  Subjective: Patient was seen and examined this morning. Pleasant elderly Caucasian female.  Propped up in bed.  Not in distress.   Event from yesterday noted, towards the afternoon yesterday, as patient was confused, pulling lines out.   This morning she had sad affect.  Mittens in place.  Knows she is in the hospital.  Disoriented otherwise.  Husband at bedside. He is upset that cardioversion got canceled today as patient was not made n.p.o. He also expressed concerns about her long-term outlook as patient is physically and mentally declining gradually for the last 2 years.  He has been the main care provider for her at home but is not sure how long he can handle that by self.    Assessment/Plan: Acute exacerbation of chronic systolic CHF History of essential hypertension -Presented with progressively worsening shortness of breath, wheezing, bilateral lower extreme edema, PND  -Most  recent echo from 2020 with EF 40 to 45%.  Repeat echo on this admission showed an improvement in LVEF to 60 to 65%. -Home meds include metoprolol, lisinopril.   -Currently on IV Lasix 20 mg twice daily with metoprolol and lisinopril -Cardiology consultation appreciated -Net IO Since Admission: 250 mL [02/08/21 1325] -Continue to monitor for daily intake output, weight, blood pressure, BNP, renal function and electrolytes. Recent Labs  Lab 02/06/21 1938 02/06/21 2016 02/07/21 0710 02/08/21 0512  BUN 11  --  12 17  CREATININE 0.59  --  0.66 0.68  K 3.0*  --  2.9* 3.4*  MG  --  2.1  --   --    A. fib with RVR -History of A. fib.  While in the ED, patient went to RVR.  Patient was started on IV Cardizem on admission.  Cardiology consult appreciated.  Cardio was switched to IV amiodarone.  Plan of cardioversion tomorrow.  Also continue Cardizem and metoprolol and anticoagulation with Eliquis 5 mg daily.  Hypokalemia -Potassium this morning showed improvement from yesterday but is still low at 3.4.  At risk of further hyperkalemia while on Lasix.  Oral replacement given this morning. Recent Labs  Lab 02/06/21 1938 02/06/21 2016 02/07/21 0710 02/08/21 0512  K 3.0*  --  2.9* 3.4*  MG  --  2.1  --   --    Dyslipidemia. -Continue Crestor  Acute delirium-toxic metabolic encephalopathy Progressively worsening dementia -Patient's husband states that her mental status has been declining gradually for the last 2 years and was hard to take care of  her at home prior to this admission.  He still thinks he can make a short-term recovery but he is unclear about long-term plan.  He believes she would not want to br in a nursing home.  He does not think she is ready at for hospice   Mobility: Independent prior to presentation Living condition: Lives at home with husband. Goals of care:   Code Status: DNR per chart Nutritional status: Body mass index is 28.7 kg/m.      Diet:  Diet Order              Diet Heart Room service appropriate? Yes; Fluid consistency: Thin  Diet effective now                  DVT prophylaxis:   apixaban (ELIQUIS) tablet 5 mg   Antimicrobials: None Fluid: None Consultants: Cardiology Family Communication: Husband at bedside  Status is: Inpatient  Continue in-hospital care because: Needs IV diuresis, plan for cardioversion tomorrow. Level of care: Telemetry Cardiac   Dispo: The patient is from: Home              Anticipated d/c is to: Hopefully home in 2 to 3 days              Patient currently is not medically stable to d/c.   Difficult to place patient No     Infusions:   sodium chloride     amiodarone 30 mg/hr (02/08/21 1101)    Scheduled Meds:  apixaban  5 mg Oral BID   aspirin EC  81 mg Oral Daily   diltiazem  180 mg Oral BID   furosemide  20 mg Intravenous Q12H   lisinopril  5 mg Oral Daily   metoprolol succinate  25 mg Oral Daily   rosuvastatin  10 mg Oral Daily    PRN meds: acetaminophen **OR** acetaminophen, magnesium hydroxide, ondansetron **OR** [DISCONTINUED] ondansetron (ZOFRAN) IV, traZODone   Antimicrobials: Anti-infectives (From admission, onward)    None       Objective: Vitals:   02/08/21 0756 02/08/21 1130  BP: (!) 148/88 (!) 154/97  Pulse: (!) 110 (!) 106  Resp: 20 20  Temp: 98.7 F (37.1 C) 98.7 F (37.1 C)  SpO2: 98% 95%    Intake/Output Summary (Last 24 hours) at 02/08/2021 1325 Last data filed at 02/08/2021 1100 Gross per 24 hour  Intake 250 ml  Output --  Net 250 ml   Filed Weights   02/06/21 1919 02/08/21 0500 02/08/21 0636  Weight: 65.8 kg 65 kg 71.2 kg   Weight change: -0.772 kg Body mass index is 28.7 kg/m.   Physical Exam: General exam: Pleasant, elderly Caucasian female.  Not in visible distress Skin: No rashes, lesions or ulcers. HEENT: Atraumatic, normocephalic, no obvious bleeding Lungs: Clear to auscultation bilaterally CVS: Regular rate and rhythm, no  murmur GI/Abd soft, nontender, nondistended, bowel sound present CNS: Alert, awake, oriented to place only Psychiatry: Sad affect Extremities: Improving bilateral pedal edema Data Review: I have personally reviewed the laboratory data and studies available.  F/u labs ordered Unresulted Labs (From admission, onward)     Start     Ordered   02/08/21 0500  CBC with Differential/Platelet  Daily,   STAT      02/07/21 1032   02/08/21 4008  Basic metabolic panel  Daily,   STAT      02/07/21 1032            Signed, Terrilee Croak, MD [facility] 02/08/2021

## 2021-02-08 NOTE — Anesthesia Preprocedure Evaluation (Addendum)
Anesthesia Evaluation  Patient identified by MRN, date of birth, ID band Patient awake    Reviewed: Allergy & Precautions, NPO status , Patient's Chart, lab work & pertinent test results  History of Anesthesia Complications Negative for: history of anesthetic complications  Airway Mallampati: IV   Neck ROM: Full    Dental  (+) Dental Advidsory Given, Chipped, Poor Dentition,    Pulmonary neg pulmonary ROS,  Small bilateral pleural effusion   + rhonchi        Cardiovascular hypertension, +CHF  (-) Past MI and (-) Cardiac Stents + dysrhythmias (a fib with RVR on Eliquis at home) (-) Valvular Problems/Murmurs Rhythm:Irregular Rate:Tachycardia  ECG 02/07/21:  Atrial fibrillation with rapid V-rate Ventricular premature complex ST depression, probably rate related   Neuro/Psych neg Seizures PSYCHIATRIC DISORDERS Dementia CVA (12/2018, husband reports whole body weakness)    GI/Hepatic negative GI ROS, Neg liver ROS,   Endo/Other  negative endocrine ROS  Renal/GU negative Renal ROS     Musculoskeletal   Abdominal   Peds  Hematology Breast CA   Anesthesia Other Findings Past Medical History: No date: A-fib [facility]) 2012: Bowel trouble     Comment:  ocasionally 10/2000: Cancer ([place])     Comment:  pt had wide excision right breast for insitu lobular               carcinoma and invasive lobular carcinoma. Pt then had a               repeat excision on 11/16/2000 No date: CHF (congestive heart failure) ([place]) No date: Dementia [facility]) 2006: Hypertension 2002: Personal history of malignant neoplasm of breast 2012: Solitary cyst of breast No date: Special screening for malignant neoplasms, colon   Reproductive/Obstetrics                            Anesthesia Physical Anesthesia Plan  ASA: 3  Anesthesia Plan: General   Post-op Pain Management: Minimal or no pain anticipated   Induction:  Intravenous  PONV Risk Score and Plan: 3 and Propofol infusion, TIVA and Treatment may vary due to age or medical condition  Airway Management Planned: Natural Airway and Nasal Cannula  Additional Equipment:   Intra-op Plan:   Post-operative Plan:   Informed Consent: I have reviewed the patients History and Physical, chart, labs and discussed the procedure including the risks, benefits and alternatives for the proposed anesthesia with the patient or authorized representative who has indicated his/her understanding and acceptance.   Patient has DNR.  Discussed DNR with power of attorney.   Consent reviewed with POA (Patient's husband at bedside)  Plan Discussed with: CRNA  Anesthesia Plan Comments: (LMA/GETA backup discussed.  Patient's husband consented for risks of anesthesia including but not limited to:  - adverse reactions to medications - damage to eyes, teeth, lips or other oral mucosa - nerve damage due to positioning  - sore throat or hoarseness - damage to heart, brain, nerves, lungs, other parts of body or loss of life  Informed patient's husband about role of CRNA in peri- and intra-operative care; he voiced understanding.)       Anesthesia Quick Evaluation

## 2021-02-09 ENCOUNTER — Inpatient Hospital Stay: Payer: Medicare PPO

## 2021-02-09 ENCOUNTER — Inpatient Hospital Stay: Payer: Medicare PPO | Admitting: Anesthesiology

## 2021-02-09 ENCOUNTER — Other Ambulatory Visit: Payer: Self-pay

## 2021-02-09 ENCOUNTER — Encounter
Admission: EM | Disposition: A | Discharge status: Skilled Nursing Facility | Payer: Self-pay | Source: Home / Self Care | Attending: Internal Medicine

## 2021-02-09 ENCOUNTER — Encounter: Payer: Self-pay | Admitting: Family Medicine

## 2021-02-09 DIAGNOSIS — I5021 Acute systolic (congestive) heart failure: Secondary | ICD-10-CM | POA: Diagnosis not present

## 2021-02-09 HISTORY — PX: CARDIOVERSION: SHX1299

## 2021-02-09 LAB — CBC WITH DIFFERENTIAL/PLATELET
Abs Immature Granulocytes: 0.05 10*3/uL (ref 0.00–0.07)
Basophils Absolute: 0.1 10*3/uL (ref 0.0–0.1)
Basophils Relative: 0 %
Eosinophils Absolute: 0 10*3/uL (ref 0.0–0.5)
Eosinophils Relative: 0 %
HCT: 37.8 % (ref 36.0–46.0)
Hemoglobin: 12.3 g/dL (ref 12.0–15.0)
Immature Granulocytes: 0 %
Lymphocytes Relative: 10 %
Lymphs Abs: 1.3 10*3/uL (ref 0.7–4.0)
MCH: 29.2 pg (ref 26.0–34.0)
MCHC: 32.5 g/dL (ref 30.0–36.0)
MCV: 89.8 fL (ref 80.0–100.0)
Monocytes Absolute: 1.7 10*3/uL — ABNORMAL HIGH (ref 0.1–1.0)
Monocytes Relative: 12 %
Neutro Abs: 10.6 10*3/uL — ABNORMAL HIGH (ref 1.7–7.7)
Neutrophils Relative %: 78 %
Platelets: 236 10*3/uL (ref 150–400)
RBC: 4.21 MIL/uL (ref 3.87–5.11)
RDW: 13.9 % (ref 11.5–15.5)
WBC: 13.7 10*3/uL — ABNORMAL HIGH (ref 4.0–10.5)
nRBC: 0 % (ref 0.0–0.2)

## 2021-02-09 LAB — BASIC METABOLIC PANEL
Anion gap: 6 (ref 5–15)
BUN: 22 mg/dL (ref 8–23)
CO2: 26 mmol/L (ref 22–32)
Calcium: 8.8 mg/dL — ABNORMAL LOW (ref 8.9–10.3)
Chloride: 103 mmol/L (ref 98–111)
Creatinine, Ser: 0.86 mg/dL (ref 0.44–1.00)
GFR, Estimated: >60 mL/min (ref >60)
Glucose, Bld: 120 mg/dL — ABNORMAL HIGH (ref 70–99)
Potassium: 3.6 mmol/L (ref 3.5–5.1)
Sodium: 135 mmol/L (ref 135–145)

## 2021-02-09 SURGERY — CARDIOVERSION
Anesthesia: General

## 2021-02-09 MED ORDER — AMIODARONE HCL 200 MG PO TABS
200.0000 mg | ORAL_TABLET | Freq: Two times a day (BID) | ORAL | Status: DC
  Administered 2021-02-09 – 2021-02-14 (×11): 200 mg via ORAL
  Filled 2021-02-09 (×11): qty 1

## 2021-02-09 MED ORDER — PROPOFOL 10 MG/ML IV BOLUS
INTRAVENOUS | Status: DC | PRN
  Administered 2021-02-09: 30 mg via INTRAVENOUS

## 2021-02-09 MED ORDER — FUROSEMIDE 20 MG PO TABS
20.0000 mg | ORAL_TABLET | Freq: Every day | ORAL | Status: DC
  Administered 2021-02-09: 20 mg via ORAL
  Filled 2021-02-09: qty 1

## 2021-02-09 MED ORDER — POTASSIUM CHLORIDE CRYS ER 20 MEQ PO TBCR
40.0000 meq | EXTENDED_RELEASE_TABLET | Freq: Once | ORAL | Status: AC
  Administered 2021-02-09: 40 meq via ORAL
  Filled 2021-02-09: qty 2

## 2021-02-09 MED ORDER — ORAL CARE MOUTH RINSE
15.0000 mL | Freq: Two times a day (BID) | OROMUCOSAL | Status: DC
  Administered 2021-02-09 – 2021-02-14 (×8): 15 mL via OROMUCOSAL

## 2021-02-09 NOTE — Progress Notes (Signed)
   02/09/21 1418  Vitals  Temp 98.2 F (36.8 C)  Temp Source Oral  BP (!) 83/66  MAP (mmHg) 73  BP Location Right Arm  BP Method Automatic  Patient Position (if appropriate) Lying  Pulse Rate (!) 47  Pulse Rate Source Monitor  Resp 20  MEWS COLOR  MEWS Score Color Yellow  Oxygen Therapy  SpO2 (!) 89 %  O2 Device Nasal Cannula  O2 Flow Rate (L/min) 3 L/min  MEWS Score  MEWS Temp 0  MEWS Systolic 1  MEWS Pulse 1  MEWS RR 0  MEWS LOC 0  MEWS Score 2  Provider Notification  Provider Name/Title Terrilee Croak  Date Provider Notified 02/09/21  Time Provider Notified [id number]  Notification Type Page (Secure chat)  Notification Reason Change in status  Rapid Response Notification  Name of Rapid Response RN Notified Charge RN notified

## 2021-02-09 NOTE — Progress Notes (Signed)
[facility] Encounter Note  Patient: Norma Hill / Admit Date: 02/07/2021 / Date of Encounter: 02/09/2021, 8:48 AM   Subjective: Overall patient has done well overnight.  No evidence of significant worsening shortness of breath chest pain or other concerns.  Patient remained in atrial fibrillation Electrical cardioversion of atrial fibrillation to normal sinus rhythm this a.m. without complication  Review of Systems: Positive for: None Negative for: Vision change, hearing change, syncope, dizziness, nausea, vomiting,diarrhea, bloody stool, stomach pain, cough, congestion, diaphoresis, urinary frequency, urinary pain,skin lesions, skin rashes Others previously listed  Objective: Telemetry: Normal sinus rhythm with 70 bpm heart rate Physical Exam: Blood pressure (!) 121/57, pulse 71, temperature 99.4 F (37.4 C), temperature source Oral, resp. rate (!) 21, height 5\' 2"  (1.575 m), weight 71.2 kg, SpO2 92 %. Body mass index is 28.7 kg/m. General: Elderly appearing Caucasian female developed, well nourished, in no acute distress sitting upright in bed. Head: Normocephalic, atraumatic, sclera non-icteric, no xanthomas,  Neck: No apparent masses Lungs: Coarse breath sounds in all lung fields with no wheezes, no rhonchi, no rales   Heart: Regular rate and rhythm, normal S1 S2, no murmur, no rub, no gallop,  Abdomen: Soft, non-distended appearing. No apparent hepatosplenomegaly.  Extremities: Trace bilateral lower extremity edema, no clubbing, no cyanosis, no ulcers,  Peripheral: 2+ radial, 2+ dorsal pedal pulses Neuro: Alert but disoriented.  Moves all extremities spontaneously. Psych:  Responds to questions appropriately with a normal affect.   Intake/Output Summary (Last 24 hours) at 02/09/2021 0848 Last data filed at 02/09/2021 0810 Gross per 24 hour  Intake 1218.29 ml  Output 300 ml  Net 918.29 ml     Inpatient Medications:   [MAR Hold] apixaban  5 mg  Oral BID   [MAR Hold] aspirin EC  81 mg Oral Daily   [MAR Hold] diltiazem  180 mg Oral BID   [MAR Hold] furosemide  20 mg Intravenous Q12H   [MAR Hold] lisinopril  5 mg Oral Daily   [MAR Hold] metoprolol succinate  25 mg Oral Daily   [MAR Hold] rosuvastatin  10 mg Oral Daily   Infusions:   sodium chloride     amiodarone 30 mg/hr (02/09/21 0201)    Labs: Recent Labs    02/06/21 2016 02/07/21 0710 02/08/21 0512 02/09/21 0309  NA  --    < > 138 135  K  --    < > 3.4* 3.6  CL  --    < > 102 103  CO2  --    < > 25 26  GLUCOSE  --    < > 122* 120*  BUN  --    < > 17 22  CREATININE  --    < > 0.68 0.86  CALCIUM  --    < > 9.1 8.8*  MG 2.1  --   --   --    < > = values in this interval not displayed.    Recent Labs    02/06/21 2016  AST 18  ALT 14  ALKPHOS 101  BILITOT 1.6*  PROT 7.5  ALBUMIN 4.2    Recent Labs    02/08/21 0512 02/09/21 0309  WBC 12.3* 13.7*  NEUTROABS 9.8* 10.6*  HGB 12.6 12.3  HCT 39.7 37.8  MCV 92.3 89.8  PLT 207 236    No results for input(s): CKTOTAL, CKMB, TROPONINI in the last 72 hours. Invalid input(s): POCBNP No results for input(s): HGBA1C in the last 72 hours.  Weights: Filed Weights   02/06/21 1919 02/08/21 0500 02/08/21 0636  Weight: 65.8 kg 65 kg 71.2 kg     Radiology/Studies:  DG Chest 2 View  Result Date: 02/06/2021 CLINICAL DATA:  A 75 year old female presents for evaluation of cough and weakness. EXAM: CHEST - 2 VIEW COMPARISON:  December 24, 2018. FINDINGS: Cardiomediastinal contours remain enlarged. Hilar structures are stable. No lobar consolidative process. No visible pneumothorax. Small RIGHT-sided pleural effusion and likely small LEFT-sided pleural effusion. Mild interstitial prominence without gross edema. On limited assessment no acute skeletal process. IMPRESSION: Small RIGHT greater than LEFT pleural effusions. Cardiomegaly with mild interstitial prominence, consider correlation with any signs of heart failure  or volume overload. Electronically Signed   By: Zetta Bills M.D.   On: 02/06/2021 20:24   ECHOCARDIOGRAM COMPLETE  Result Date: 02/07/2021    ECHOCARDIOGRAM REPORT   Patient Name:   Norma Hill Date of Exam: 02/07/2021 Medical Rec #:  [id number]          Height:       62.0 in Accession #:    [id number]         Weight:       145.0 lb Date of Birth:  1945/11/26          BSA:          1.667 m Patient Age:    75 years           BP:           123/84 mmHg Patient Gender: F                  HR:           75 bpm. Exam Location:  [facility] Procedure: 2D Echo, Color Doppler and Cardiac Doppler Indications:     I50.21 congestive heart failure-Acute Systolic  History:         Patient has prior history of Echocardiogram examinations.                  Arrythmias:Atrial Fibrillation, Signs/Symptoms:Shortness of                  Breath and Edema; Risk Factors:Hypertension.  Sonographer:     Charmayne Sheer Referring Phys:  [id number] [place] Diagnosing Phys: Serafina Royals MD  Sonographer Comments: Suboptimal apical window and no subcostal window. IMPRESSIONS  1. Left ventricular ejection fraction, by estimation, is 60 to 65%. The left ventricle has normal function. The left ventricle has no regional wall motion abnormalities. Left ventricular diastolic parameters were normal.  2. Right ventricular systolic function is normal. The right ventricular size is mildly enlarged.  3. Left atrial size was mildly dilated.  4. Right atrial size was mildly dilated.  5. The mitral valve is normal in structure. Trivial mitral valve regurgitation.  6. The aortic valve is normal in structure. Aortic valve regurgitation is not visualized. FINDINGS  Left Ventricle: Left ventricular ejection fraction, by estimation, is 60 to 65%. The left ventricle has normal function. The left ventricle has no regional wall motion abnormalities. The left ventricular internal cavity size was normal in size. There is  no left ventricular hypertrophy. Left  ventricular diastolic parameters were normal. Right Ventricle: The right ventricular size is mildly enlarged. No increase in right ventricular wall thickness. Right ventricular systolic function is normal. Left Atrium: Left atrial size was mildly dilated. Right Atrium: Right atrial size was mildly dilated. Pericardium: There is no evidence of pericardial effusion. Mitral  Valve: The mitral valve is normal in structure. Trivial mitral valve regurgitation. Tricuspid Valve: The tricuspid valve is normal in structure. Tricuspid valve regurgitation is mild. Aortic Valve: The aortic valve is normal in structure. Aortic valve regurgitation is not visualized. Aortic valve mean gradient measures 3.0 mmHg. Aortic valve peak gradient measures 5.7 mmHg. Aortic valve area, by VTI measures 2.53 cm. Pulmonic Valve: The pulmonic valve was normal in structure. Pulmonic valve regurgitation is trivial. Aorta: The aortic root and ascending aorta are structurally normal, with no evidence of dilitation. IAS/Shunts: No atrial level shunt detected by color flow Doppler.  LEFT VENTRICLE PLAX 2D LVIDd:         4.73 cm   Diastology LVIDs:         3.83 cm   LV e' medial:    12.30 cm/s LV PW:         0.94 cm   LV E/e' medial:  6.1 LV IVS:        0.81 cm   LV e' lateral:   13.30 cm/s LVOT diam:     2.10 cm   LV E/e' lateral: 5.7 LV SV:         54 LV SV Index:   33 LVOT Area:     3.46 cm  LEFT ATRIUM             Index LA diam:        4.70 cm 2.82 cm/m LA Vol (A2C):   52.0 ml 31.18 ml/m LA Vol (A4C):   95.2 ml 57.09 ml/m LA Biplane Vol: 75.7 ml 45.40 ml/m  AORTIC VALVE                    PULMONIC VALVE AV Area (Vmax):    2.20 cm     PV Vmax:       0.76 m/s AV Area (Vmean):   2.35 cm     PV Vmean:      48.200 cm/s AV Area (VTI):     2.53 cm     PV VTI:        0.107 m AV Vmax:           119.00 cm/s  PV Peak grad:  2.3 mmHg AV Vmean:          79.600 cm/s  PV Mean grad:  1.0 mmHg AV VTI:            0.215 m AV Peak Grad:      5.7 mmHg AV Mean  Grad:      3.0 mmHg LVOT Vmax:         75.70 cm/s LVOT Vmean:        53.900 cm/s LVOT VTI:          0.157 m LVOT/AV VTI ratio: 0.73  AORTA Ao Root diam: 3.10 cm MITRAL VALVE               TRICUSPID VALVE MV Area (PHT): 5.57 cm    TR Peak grad:   26.0 mmHg MV Decel Time: 136 msec    TR Vmax:        255.00 cm/s MV E velocity: 75.42 cm/s MV A velocity: 39.40 cm/s  SHUNTS MV E/A ratio:  1.91        Systemic VTI:  0.16 m                            Systemic Diam: 2.10 cm  Serafina Royals MD Electronically signed by Serafina Royals MD Signature Date/Time: 02/07/2021/1:30:21 PM    Final      Assessment and Recommendation  75 y.o. female with a atrial fibrillation anticoagulated on Eliquis, heart failure with preserved ejection fraction, hypertension, history of CVA in 2020 who presented to the ED on 12/5 with chief complaint of progressively worsening shortness of breath and generalized weakness.  Now status post electrical cardioversion of atrial fibrillation to normal sinus rhythm  #Atrial fibrillation with RVR Continuation of amiodarone but changing to 400 mg each day for discharge to home  Continuation of anticoagulation with Eliquis 5 mg twice per day for further risk reduction of stroke with atrial fibrillation   #Heart failure with preserved ejection fraction -Echocardiogram revealed estimated LVEF 60-65%.  Its likely her atrial fibrillation is contributing to her heart failure symptoms. -Continue diuresis with Lasix but change to oral   #Hypertension -Continue lisinopril   #Dyslipidemia - continue crestor    #Hypokalemia -Potassium improved to 3.4 today  -Continue to replete and monitor BMP  The patient has been interviewed and examined.   Serafina Royals MD [facility]

## 2021-02-09 NOTE — Progress Notes (Addendum)
Physical Therapy Treatment Patient Details Name: Norma Hill MRN: [id number] DOB: 1945/03/13 Today's Date: 02/09/2021   History of Present Illness pt is a 75 y.o. female with a past medical history of atrial fibrillation anticoagulated on Eliquis, heart failure with preserved ejection fraction, hypertension, history of CVA in 2020 who presented to the ED on 12/5 with chief complaint of progressively worsening shortness of breath and generalized weakness.    PT Comments    Pt was asleep in long sitting upon arriving. She remained very lethargic throughout time Pryor Curia was in room. Pt's supportive spouse was at bedside. He states," I need your help getting her to rehab." Pt remained asleep while spouse explained that he does not feel he can safely manage pt at home. Pt was able to be aroused however quickly falls back to sleep if not constantly engaged . Chief Strategy Officer discussed with RN pt's low HR and concerns. Discussed PT recommendation with evaluating PT from previous date. The evaluating therapist is planning to return to treat pt tomorrow to compare and see about change in status. Pt did attempt to sit up with author however c/o dizziness" I'm going to faint."  Author decided to discontinue session at this point. Pt quickly fell back to sleep after returning to long sitting in bed. Acute PT will return tomorrow and plan to discuss and update DC recs as appropriate.    Recommendations for follow up therapy are one component of a multi-disciplinary discharge planning process, led by the attending physician.  Recommendations may be updated based on patient status, additional functional criteria and insurance authorization.  Follow Up Recommendations  Other (comment) (per chart review, pt will be having a meeting tomorrow to decide POC going forward. pt unable to tolerate very much today due to lethargy. Spouse requesting rehab at DC.)     Assistance Recommended at Discharge Frequent or constant  Supervision/Assistance  Equipment Recommendations  Rolling walker (2 wheels)       Precautions / Restrictions Precautions Precautions: Fall     Mobility  Bed Mobility Overal bed mobility: Needs Assistance Bed Mobility: Supine to Sit;Sit to Supine     Supine to sit: Min assist Sit to supine: Min assist   General bed mobility comments: Pt needed constant cues and engagement to stay awake. she does agree to sit EOB however upon attempting to sit up, C/O " I feel like i'm gonn faint." Author assisted pt back into bed and she quickly fell back to sleep. pt's HR less than 50 BPM throughout time author was in room.    Transfers  General transfer comment: unable due to pt's lethargy/ "feeling like fainting."      Balance Overall balance assessment: Needs assistance Sitting-balance support: Feet supported Sitting balance-Leahy Scale: Fair       Cognition Arousal/Alertness: Lethargic;Suspect due to medications Behavior During Therapy: [facility] for tasks assessed/performed;Flat affect Overall Cognitive Status: Within Functional Limits for tasks assessed      General Comments: Pt was asleep upon arriving with spouse at bedside. Pt's spouse voices that he does not feel he can take her of her on his own at home. He requested she DC to rehab at DC.               Pertinent Vitals/Pain Pain Assessment: No/denies pain     PT Goals (current goals can now be found in the care plan section) Acute Rehab PT Goals Patient Stated Goal: " I'll go to rehab if I have to but I dont  want too." Progress towards PT goals: Progressing toward goals    Frequency    Min 2X/week      PT Plan Discharge plan needs to be updated       AM-PAC PT "6 Clicks" Mobility   Outcome Measure  Help needed turning from your back to your side while in a flat bed without using bedrails?: None Help needed moving from lying on your back to sitting on the side of a flat bed without using bedrails?:  None Help needed moving to and from a bed to a chair (including a wheelchair)?: None Help needed standing up from a chair using your arms (e.g., wheelchair or bedside chair)?: None Help needed to walk in hospital room?: A Little Help needed climbing 3-5 steps with a railing? : A Little 6 Click Score: 22    End of Session Equipment Utilized During Treatment: Gait belt Activity Tolerance: Patient tolerated treatment well Patient left: in bed;with call bell/phone within reach;with bed alarm set;with family/visitor present Nurse Communication: Mobility status PT Visit Diagnosis: Other abnormalities of gait and mobility (R26.89);Difficulty in walking, not elsewhere classified (R26.2);Muscle weakness (generalized) (M62.81)     Time: 1357-1410 PT Time Calculation (min) (ACUTE ONLY): 13 min  Charges:  $Therapeutic Activity: 8-22 mins          Julaine Fusi PTA 02/09/21, 5:14 PM

## 2021-02-09 NOTE — Progress Notes (Signed)
PROGRESS NOTE  Norma Hill  DOB: Sep 28, 1945  PCP: Guadalupe Maple, MD EYC:[id number]  DOA: 02/07/2021  LOS: 2 days  Hospital Day: 3  Chief Complaint  Patient presents with   Weakness    Brief narrative: Norma Hill is a 75 y.o. female with PMH significant for HTN, A. fib, breast cancer. Patient presented to the ED on 12/4 with complaint of progressively worsening shortness of breath, dry cough, wheezing, bilateral lower edema, PND and generalized weakness.  In the ED, patient was afebrile, heart rate 101, blood pressure 182/98, breathing on room air Later in the ED, she went to A. fib with RVR, confirmed by EKG with heart rate up to 150s Labs with potassium low at 3, WBC slightly elevated 12.8 EKG showed A. fib with RVR, ST segment depression lateral leads Chest x-ray showed small bilateral pleural effusion, cardiomegaly, mild interstitial prominence concerning for heart failure and volume overload. Patient was started on IV Lasix, IV Cardizem drip and admitted to hospitalist service.  Subjective: Patient was seen and examined this morning. Underwent cardioversion this morning. Present frail elderly female, sleepy, propped up in bed. Husband at bedside. Patient has congested cough on deep breathing.  Has been running low-grade temperature. I think it is mostly atelectasis causing her to have oxygen dependence, cough, low-grade fever.  But could slowly progress to pneumonia as well. I had a long discussion with husband at bedside.  He wants to take care of her at home but he understands that she may be beyond that point now. He is open to the conversation of long-term placement.  But wants to know insurance coverage and financial responsibility. Plan B would be home with hospice.  He is still not ready for that but he sees it coming.  Assessment/Plan: Acute exacerbation of chronic systolic CHF History of essential hypertension -Presented with progressively  worsening shortness of breath, wheezing, bilateral lower extreme edema, PND.  Diuresed with IV Lasix.  Switch to oral Lasix this morning.  Also continue metoprolol and lisinopril. -Cardiology consultation appreciated. -Previous echo from 2020 had shown EF 40 to 45%.  Repeat echo on this admission showed an improvement in LVEF to 60 to 65%. -Net IO Since Admission: 1,278.29 mL [02/09/21 1246] -Continue to monitor for daily intake output, weight, blood pressure, BNP, renal function and electrolytes. Recent Labs  Lab 02/06/21 1938 02/06/21 2016 02/07/21 0710 02/08/21 0512 02/09/21 0309  BUN 11  --  12 17 22   CREATININE 0.59  --  0.66 0.68 0.86  K 3.0*  --  2.9* 3.4* 3.6  MG  --  2.1  --   --   --    A. fib with RVR -History of A. fib.  While in the ED, patient went to RVR.  Patient was started on IV Cardizem on admission.  Cardiology consult appreciated.  She was switched to IV amiodarone drip.  Underwent DC cardioversion this morning 12/7.  Amiodarone was switched to oral.  Also on metoprolol.  On Eliquis for anticoagulation.  Acute respiratory failure with hypoxia -Requiring oxygen 2 to 3 L by nasal cannula.  Secondary to atelectasis and CHF exacerbation. -Continue Lasix, continue incentive spirometry -Encourage participation with physical therapy.  Hypokalemia -Potassium level gradually improving.  At 3.6 this morning.  Since patient is currently on amiodarone as well.  Target potassium more than 4.  We will replace potassium today. Recent Labs  Lab 02/06/21 1938 02/06/21 2016 02/07/21 0710 02/08/21 0512 02/09/21 0309  K 3.0*  --  2.9* 3.4* 3.6  MG  --  2.1  --   --   --    Dyslipidemia. -continue Crestor  Acute delirium-toxic metabolic encephalopathy Progressively worsening dementia -Patient's husband states that her mental status has been declining gradually for the last 2 years and was hard to take care of her at home prior to this admission.   -I had a long discussion  with husband at bedside.  He wants to take care of her at home but he understands that she may be beyond that point now. He is open to the conversation of long-term placement.  But wants to know insurance coverage and financial responsibility. Plan B would be home with hospice.  He is still not ready for that but he sees it coming. -Social work consulted.   Mobility: Independent prior to presentation Living condition: Lives at home with husband. Goals of care:   Code Status: DNR per chart Nutritional status: Body mass index is 28.7 kg/m.      Diet:  Diet Order             Diet Heart Room service appropriate? Yes; Fluid consistency: Thin  Diet effective now                  DVT prophylaxis:   apixaban (ELIQUIS) tablet 5 mg   Antimicrobials: None Fluid: None Consultants: Cardiology Family Communication: Husband at bedside  Status is: Inpatient  Continue in-hospital care because: Weak, oxygen dependent, may need long-term placement. Level of care: Telemetry Cardiac   Dispo: The patient is from: Home              Anticipated d/c is to: Long-term placement versus home, pending social work              Patient currently is not medically stable to d/c.   Difficult to place patient No     Infusions:     Scheduled Meds:  amiodarone  200 mg Oral BID   apixaban  5 mg Oral BID   aspirin EC  81 mg Oral Daily   diltiazem  180 mg Oral BID   furosemide  20 mg Oral Daily   lisinopril  5 mg Oral Daily   metoprolol succinate  25 mg Oral Daily   potassium chloride  40 mEq Oral Once   rosuvastatin  10 mg Oral Daily    PRN meds: acetaminophen **OR** acetaminophen, magnesium hydroxide, ondansetron **OR** [DISCONTINUED] ondansetron (ZOFRAN) IV, traZODone   Antimicrobials: Anti-infectives (From admission, onward)    None       Objective: Vitals:   02/09/21 0933 02/09/21 1157  BP: 126/63 (!) 101/54  Pulse: 75 (!) 49  Resp: 18 16  Temp: 99.5 F (37.5 C) 98.7 F  (37.1 C)  SpO2: 92% (!) 88%    Intake/Output Summary (Last 24 hours) at 02/09/2021 1246 Last data filed at 02/09/2021 1017 Gross per 24 hour  Intake 999.75 ml  Output 300 ml  Net 699.75 ml   Filed Weights   02/06/21 1919 02/08/21 0500 02/08/21 0636  Weight: 65.8 kg 65 kg 71.2 kg   Weight change:  Body mass index is 28.7 kg/m.   Physical Exam: General exam: Pleasant, elderly Caucasian female.  Shallow breathing.  Not in pain Skin: No rashes, lesions or ulcers. HEENT: Atraumatic, normocephalic, no obvious bleeding Lungs: Diminished air entry in both bases, intermittent congested cough CVS: Regular rate and rhythm, no murmur GI/Abd soft, nontender, nondistended, bowel sound present CNS: Alert, awake, oriented  to place only Psychiatry: Sad affect Extremities: Improving bilateral pedal edema  Data Review: I have personally reviewed the laboratory data and studies available.  F/u labs ordered Unresulted Labs (From admission, onward)     Start     Ordered   02/08/21 0500  CBC with Differential/Platelet  Daily,   STAT      02/07/21 1032   02/08/21 2620  Basic metabolic panel  Daily,   STAT      02/07/21 1032            Signed, Terrilee Croak, MD [facility] 02/09/2021

## 2021-02-09 NOTE — Progress Notes (Addendum)
Patient was seen and examined again this afternoon. Multiple regular updates were sent to me by nursing staff earlier and I was able to communicate with them by phone.  I had a long conversation with patient's husband this morning who states that patient is declining gradually last 2 years and more rapidly recently.  One of his sisters is a hospice nurse who saw her at home prior to this hospitalization and wondered if patient is at a state of requiring hospice care. Patient's husband is the sole caregiver at home and understands he may not be able to continue the same going forward.  He however recalls her telling him that she does not want to be in a nursing home.  Palliative care consulted.  Social work consulted as well.  Patient and his sister to meet with social work again tomorrow to decide between long-term placement versus hospice care.  Since admission, our goal has been to treat reversible causes and make her appropriate enough to go home. The initial diagnosis on admission was CHF exacerbation however her ejection fraction was noted to be better than last year at 60-65%.  She was in A. fib with RVR which improved with medicines as well as a cardioversion this morning.    Because of coexisting dementia and physical decline, patient has poor respiratory effort, diminished air entry in both bases and wet cough on attempt to take a deep breath. She may also be silently aspirating. Although I do not see evidence of pneumonia at this time.  I have been repeatedly instructing her to use incentive spirometry.  As of this afternoon, patient is propped up in bed.  Mental status seems to have improved in last few hours.  She is alert, awake, knows she is in the hospital.  She understands that she is at risk of developing pneumonia and needs to initiate cough and participate with therapy. Her heart rate since this morning after cardioversion has been running low in 40s, blood pressure earlier was low  at 83/66 because of which I could not give Lasix despite worsening dyspnea.   Last blood pressure at 4:30 was in 90s, on 3 to 4 L oxygen by nasal cannula.  At this time, do not think patient would benefit from any fluid to improve the blood pressure or Lasix to improve the breathing.  Her main problem is her physical deconditioning, dementia leading to atelectasis and significant risk of aspiration. At this time, I do not see any evidence of evolving sepsis or acute CHF decompensation to require further aggressive care mild IV pressure or IV albumin +/- IV Lasix.  I am however concerned about her low heart rate in 40s since DCCV this morning.   Prior to admission, patient was on Cardizem 180 mg twice daily, metoprolol 25 mg daily.  Currently patient is on both of them as well as amiodarone 200 mg twice daily that was added after cardioversion today.  I wonder if reducing the number/dose of these AV nodal blocking agents would help heart rate and blood pressure improved. I paged her cardiologist Dr. Nehemiah Massed to suggest about this..  I called and updated patient's husband again.  As of now, he is going to see his sister who is a hospice nurse to ask for her suggestion.  Addendum 510 pm - discussed with Dr. Nehemiah Massed - suggested to stop cardizem and metoprolol and continue amiodarone.  If her heart rate and blood pressure improved with this change, she may be eligible to get  IV Lasix. Needs to continue to monitor. Communicated with nursing staff.

## 2021-02-09 NOTE — Progress Notes (Signed)
   02/09/21 2015  Clinical Encounter Type  Visited With Patient not available  Visit Type Initial;Spiritual support;Social support  Referral From Nurse  Consult/Referral To [place] responded to OR. When Chaplain arrived PT was asleep. No family present. Chaplain ministered with calm presence and prayer at PT's door.

## 2021-02-09 NOTE — CV Procedure (Signed)
Electrical Cardioversion Procedure Note Norma Hill [id number] 09-01-1945  Procedure: Electrical Cardioversion Indications:  Paroxysmal non valvular atrial fibrillation  Procedure Details Consent: Risks of procedure as well as the alternatives and risks of each were explained to the (patient/caregiver).  Consent for procedure obtained. Time Out: Verified patient identification, verified procedure, site/side was marked, verified correct patient position, special equipment/implants available, medications/allergies/relevent history reviewed, required imaging and test results available.  Performed  Patient placed on cardiac monitor, pulse oximetry, supplemental oxygen as necessary.  Sedation given: Propofol and versed as per anesthesia  Pacer pads placed anterior and posterior chest.  Cardioverted 1 time(s).  Cardioverted at 120J.  Evaluation Findings: Post procedure EKG shows: NSR Complications: None Patient did tolerate procedure well.   Serafina Royals M.D. [facility] 02/09/2021, 8:47 AM

## 2021-02-09 NOTE — Anesthesia Postprocedure Evaluation (Signed)
Anesthesia Post Note  Patient: Norma Hill  Procedure(s) Performed: CARDIOVERSION  Patient location during evaluation: Specials Recovery Anesthesia Type: General Level of consciousness: awake and alert Pain management: pain level controlled Vital Signs Assessment: post-procedure vital signs reviewed and stable Respiratory status: spontaneous breathing, nonlabored ventilation, respiratory function stable and patient connected to nasal cannula oxygen Cardiovascular status: blood pressure returned to baseline and stable Postop Assessment: no apparent nausea or vomiting Anesthetic complications: no   No notable events documented.   Last Vitals:  Vitals:   02/09/21 0812 02/09/21 0815  BP: (!) 108/59 124/65  Pulse: 71 74  Resp: (!) 22 20  Temp:    SpO2: 90% 91%    Last Pain:  Vitals:   02/09/21 0342  TempSrc: Oral  PainSc: 0-No pain                 Martha Clan

## 2021-02-09 NOTE — Evaluation (Signed)
Occupational Therapy Evaluation Patient Details Name: Norma Hill MRN: [id number] DOB: 29-May-1945 Today's Date: 02/09/2021   History of Present Illness pt is a 75 y.o. female with a past medical history of atrial fibrillation anticoagulated on Eliquis, heart failure with preserved ejection fraction, hypertension, history of CVA in 2020 who presented to the ED on 12/5 with chief complaint of progressively worsening shortness of breath and generalized weakness.   Clinical Impression   Patient presenting with decreased Ind in self care, balance, functional mobility/transfers, endurance, and safety awareness. Patient reports living at home with husband. She is independent with self care tasks and husband assist with IADL tasks. Patient needing min A to transfer to EOB. She did have cardioversion this morning. Once seated on EOB, she reports nausea and then vomits. RN called for medication. Pt requesting to return to bed secondary to feeling unwell therfore OOB evaluation limited. Patient will benefit from acute OT to increase overall independence in the areas of ADLs, functional mobility, and safety awareness in order to safely discharge home with husband.      Recommendations for follow up therapy are one component of a multi-disciplinary discharge planning process, led by the attending physician.  Recommendations may be updated based on patient status, additional functional criteria and insurance authorization.   Follow Up Recommendations  Home health OT    Assistance Recommended at Discharge Intermittent Supervision/Assistance  Functional Status Assessment  Patient has had a recent decline in their functional status and demonstrates the ability to make significant improvements in function in a reasonable and predictable amount of time.  Equipment Recommendations  None recommended by OT       Precautions / Restrictions Precautions Precautions: Fall      Mobility Bed Mobility Overal  bed mobility: Needs Assistance Bed Mobility: Supine to Sit;Sit to Supine     Supine to sit: Min assist Sit to supine: Min assist   General bed mobility comments: min cuing for technique and hand placement and min A for trunk support    Transfers                   General transfer comment: not attempted secondary to vomiting      Balance Overall balance assessment: Needs assistance Sitting-balance support: Feet supported Sitting balance-Leahy Scale: Good       Standing balance-Leahy Scale: Good                             ADL either performed or assessed with clinical judgement   ADL                                         General ADL Comments: Pt limited this session secondary to nausea and vomiting.     Vision Patient Visual Report: No change from baseline              Pertinent Vitals/Pain Pain Assessment: No/denies pain     Hand Dominance Right   Extremity/Trunk Assessment Upper Extremity Assessment Upper Extremity Assessment: Overall WFL for tasks assessed   Lower Extremity Assessment Lower Extremity Assessment: Generalized weakness   Cervical / Trunk Assessment Cervical / Trunk Assessment: Normal   Communication Communication Communication: No difficulties   Cognition Arousal/Alertness: Awake/alert Behavior During Therapy: WFL for tasks assessed/performed;Flat affect  General Comments: Pt oriented, pleasant, and cooperative during session. Flat from prior CVA.                Home Living Family/patient expects to be discharged to:: Private residence Living Arrangements: Spouse/significant other Available Help at Discharge: Family;Available 24 hours/day Type of Home: House Home Access: Stairs to enter [employer] of Steps: 3 Entrance Stairs-Rails: None Home Layout: One level     Bathroom Shower/Tub: Occupational psychologist:  Standard     Home Equipment: Shower seat - built in          Prior Functioning/Environment Prior Level of Function : Independent/Modified Independent             Mobility Comments: per pt, independent without AD for mobility ADLs Comments: reported independence for ADLs. assist for all IADLs by husband        OT Problem List: Decreased strength;Decreased activity tolerance;Impaired balance (sitting and/or standing);Decreased knowledge of use of DME or AE;Decreased safety awareness;Pain      OT Treatment/Interventions: Self-care/ADL training;Therapeutic exercise;Therapeutic activities;Energy conservation;DME and/or AE instruction;Balance training;Manual therapy;Patient/family education;Neuromuscular education    OT Goals(Current goals can be found in the care plan section) Acute Rehab OT Goals Patient Stated Goal: to feel better OT Goal Formulation: With patient Time For Goal Achievement: 02/23/21 Potential to Achieve Goals: Good ADL Goals Pt Will Perform Grooming: with modified independence;standing Pt Will Perform Lower Body Dressing: with modified independence;sit to/from stand Pt Will Transfer to Toilet: with modified independence;ambulating Pt Will Perform Toileting - Clothing Manipulation and hygiene: with modified independence;sit to/from stand  OT Frequency: Min 2X/week   Barriers to D/C:    none known at this time          AM-PAC OT "6 Clicks" Daily Activity     Outcome Measure Help from another person eating meals?: None Help from another person taking care of personal grooming?: None Help from another person toileting, which includes using toliet, bedpan, or urinal?: A Little Help from another person bathing (including washing, rinsing, drying)?: A Little Help from another person to put on and taking off regular upper body clothing?: None Help from another person to put on and taking off regular lower body clothing?: A Little 6 Click Score: 21   End of  Session Nurse Communication: Mobility status  Activity Tolerance: Patient tolerated treatment well Patient left: in bed;with bed alarm set  OT Visit Diagnosis: Unsteadiness on feet (R26.81);Muscle weakness (generalized) (M62.81)                Time: 4656-8127 OT Time Calculation (min): 17 min Charges:  OT General Charges $OT Visit: 1 Visit OT Evaluation $OT Eval Low Complexity: 1 Low OT Treatments $Self Care/Home Management : 8-22 mins Darleen Crocker, MS, OTR/L , CBIS ascom [phone]  02/09/21, 1:12 PM

## 2021-02-09 NOTE — Progress Notes (Signed)
Spoke with MD regarding [place] for patient, chest xray ordered. MD to contact patient husband and discuss GOC.

## 2021-02-09 NOTE — TOC Initial Note (Signed)
Transition of Care [facility]) - Initial/Assessment Note    Patient Details  Name: Norma Hill MRN: [id number] Date of Birth: 10/09/1945  Transition of [facility]) CM/SW Contact:    Alberteen Sam, LCSW Phone Number: 02/09/2021, 1:58 PM  Clinical Narrative:                  CSW met with patient and spouse at bedside to discuss discharge planning.   CSW notes OT and PT recs are home health.   Spouse at bedside tearful regarding taking patient home in this state, Reports he has been caregiver for patient however reports patient's current needs exceed what he can do at home at this time. Reports he believes patient needs facility placement at SNF. CSW informed him that currently since PT/OT recs are for home, that insurance will not cover snf placement. Spouse reports they will be unable to private pay for placement.   CSW has reached out to PT to reassess to see if recommendations have changed.   At this time, spouse reports he will be unable to care for patient at home.   Expected Discharge Plan:  (to be deteremined) Barriers to Discharge: Continued Medical Work up   Patient Goals and CMS Choice Patient states their goals for this hospitalization and ongoing recovery are:: Patient and husband just want to find out what is going on, husband wants his wife better and back at home CMS Medicare.gov Compare Post Acute Care list provided to:: Patient Represenative (must comment) (spouse) Choice offered to / list presented to : Spouse  Expected Discharge Plan and Services Expected Discharge Plan:  (to be deteremined)   Discharge Planning Services: CM Consult Post Acute Care Choice: [place] arrangements for the past 2 months: Single Family Home                 DME Arranged: N/A DME Agency: NA                  Prior Living Arrangements/Services Living arrangements for the past 2 months: Single Family Home Lives with:: Spouse Patient language and need for  interpreter reviewed:: Yes Do you feel safe going back to the place where you live?: Yes      Need for Family Participation in Patient Care: Yes (Comment) Care giver support system in place?: Yes (comment) (husband)   Criminal Activity/Legal Involvement Pertinent to Current Situation/Hospitalization: No - Comment as needed  Activities of Daily Living Home Assistive Devices/Equipment: None ADL Screening (condition at time of admission) Patient's cognitive ability adequate to safely complete daily activities?: No Is the patient deaf or have difficulty hearing?: No Does the patient have difficulty seeing, even when wearing glasses/contacts?: No Does the patient have difficulty concentrating, remembering, or making decisions?: Yes Patient able to express need for assistance with ADLs?: No Does the patient have difficulty dressing or bathing?: Yes Independently performs ADLs?: No Communication: Independent Dressing (OT): Needs assistance Is this a change from baseline?: Change from baseline, expected to last <3days Grooming: Needs assistance Is this a change from baseline?: Change from baseline, expected to last <3 days Feeding: Needs assistance Is this a change from baseline?: Change from baseline, expected to last <3 days Bathing: Needs assistance Is this a change from baseline?: Change from baseline, expected to last <3 days Toileting: Needs assistance Is this a change from baseline?: Change from baseline, expected to last <3 days In/Out Bed: Needs assistance Is this a change from baseline?: Change from baseline, expected  to last <3 days Walks in Home: Independent Does the patient have difficulty walking or climbing stairs?: No Weakness of Legs: None Weakness of Arms/Hands: None  Permission Sought/Granted Permission sought to share information with : Case Manager, Family Supports Permission granted to share information with : Yes, Verbal Permission Granted  Share Information with  NAME: Larenda Reedy     Permission granted to share info w Relationship: Spouse  Permission granted to share info w Contact Information: [phone]  Emotional Assessment Appearance:: Appears stated age Attitude/Demeanor/Rapport: Engaged Affect (typically observed): Accepting Orientation: : Oriented to Self, Oriented to Place, Oriented to  Time, Oriented to Situation Alcohol / Substance Use: Not Applicable Psych Involvement: No (comment)  Admission diagnosis:  Exertional dyspnea [R06.09] Acute CHF (congestive heart failure) (HCC) [I50.9] Generalized weakness [R53.1] Atrial fibrillation with RVR (HCC) [I48.91] Pulmonary vascular congestion [R09.89] Patient Active Problem List   Diagnosis Date Noted   Acute CHF (congestive heart failure) ([place]) 02/07/2021   Bleeding 04/13/2020   Frequent bowel movements 04/13/2020   At high risk for falls 61/25/4832   Systolic heart failure ([place]) 03/10/2019   Elevated LDL cholesterol level 03/10/2019   Dementia ([place]) 12/19/2018   History of stroke 12/19/2018   Atrial fibrillation with RVR ([place]) 09/29/2018   Essential hypertension 02/22/2016   Personal history of malignant neoplasm of breast    PCP:  Guadalupe Maple, MD Pharmacy:   [facility] [place] [place] [address] Phone: [phone] Fax: [phone]     Social Determinants of Health (SDOH) Interventions    Readmission Risk Interventions No flowsheet data found.

## 2021-02-09 NOTE — Progress Notes (Signed)
11:30 : Patient returned from cardioversion this morning, VSS aside from low grade temp, NSR, no complaints of pain. Patient lying in bed upon assessment lung sounds with coarse crackles/rhonchi, overall "wet." RN asked patient to deep breathe and cough, however patient unable to produce any sputum. MD notified of patient status no new orders placed at this time. States he "will see her soon."   1425 : RN noticed patient HR in the 40's this afternoon. Patient resting in bed, husband at bedside, new set of vitals acquired, BP 83/66, HR 47, O2 89% [place] @ 3 L, T 98.2. Increased O2 to 4.5 L [place].  MD notified of change in patient status and possible need for palliative consult. No interventions ordered per MD, palliative consult placed.

## 2021-02-09 NOTE — Transfer of Care (Signed)
Immediate Anesthesia Transfer of Care Note  Patient: Norma Hill  Procedure(s) Performed: CARDIOVERSION  Patient Location: PACU  Anesthesia Type:General  Level of Consciousness: awake and drowsy  Airway & Oxygen Therapy: Patient Spontanous Breathing and Patient connected to nasal cannula oxygen  Post-op Assessment: Report given to RN and Post -op Vital signs reviewed and stable  Post vital signs: Reviewed and stable  Last Vitals:  Vitals Value Taken Time  BP 108/59 02/09/21 0812  Temp    Pulse 71 02/09/21 0812  Resp 22 02/09/21 0812  SpO2 90 % 02/09/21 0812    Last Pain:  Vitals:   02/09/21 0342  TempSrc: Oral  PainSc: 0-No pain         Complications: No notable events documented.

## 2021-02-10 ENCOUNTER — Encounter: Payer: Self-pay | Admitting: Internal Medicine

## 2021-02-10 DIAGNOSIS — Z515 Encounter for palliative care: Secondary | ICD-10-CM | POA: Diagnosis not present

## 2021-02-10 DIAGNOSIS — Z7189 Other specified counseling: Secondary | ICD-10-CM

## 2021-02-10 DIAGNOSIS — I5021 Acute systolic (congestive) heart failure: Secondary | ICD-10-CM | POA: Diagnosis not present

## 2021-02-10 LAB — CBC WITH DIFFERENTIAL/PLATELET
Abs Immature Granulocytes: 0.1 10*3/uL — ABNORMAL HIGH (ref 0.00–0.07)
Basophils Absolute: 0.1 10*3/uL (ref 0.0–0.1)
Basophils Relative: 0 %
Eosinophils Absolute: 0 10*3/uL (ref 0.0–0.5)
Eosinophils Relative: 0 %
HCT: 35.4 % — ABNORMAL LOW (ref 36.0–46.0)
Hemoglobin: 11.5 g/dL — ABNORMAL LOW (ref 12.0–15.0)
Immature Granulocytes: 1 %
Lymphocytes Relative: 8 %
Lymphs Abs: 1.2 10*3/uL (ref 0.7–4.0)
MCH: 29.2 pg (ref 26.0–34.0)
MCHC: 32.5 g/dL (ref 30.0–36.0)
MCV: 89.8 fL (ref 80.0–100.0)
Monocytes Absolute: 1.9 10*3/uL — ABNORMAL HIGH (ref 0.1–1.0)
Monocytes Relative: 13 %
Neutro Abs: 12.1 10*3/uL — ABNORMAL HIGH (ref 1.7–7.7)
Neutrophils Relative %: 78 %
Platelets: 236 10*3/uL (ref 150–400)
RBC: 3.94 MIL/uL (ref 3.87–5.11)
RDW: 13.9 % (ref 11.5–15.5)
WBC: 15.4 10*3/uL — ABNORMAL HIGH (ref 4.0–10.5)
nRBC: 0 % (ref 0.0–0.2)

## 2021-02-10 LAB — BASIC METABOLIC PANEL
Anion gap: 9 (ref 5–15)
BUN: 37 mg/dL — ABNORMAL HIGH (ref 8–23)
CO2: 25 mmol/L (ref 22–32)
Calcium: 8.8 mg/dL — ABNORMAL LOW (ref 8.9–10.3)
Chloride: 102 mmol/L (ref 98–111)
Creatinine, Ser: 1.95 mg/dL — ABNORMAL HIGH (ref 0.44–1.00)
GFR, Estimated: 26 mL/min — ABNORMAL LOW (ref >60)
Glucose, Bld: 126 mg/dL — ABNORMAL HIGH (ref 70–99)
Potassium: 4 mmol/L (ref 3.5–5.1)
Sodium: 136 mmol/L (ref 135–145)

## 2021-02-10 LAB — PROCALCITONIN: Procalcitonin: 0.57 ng/mL

## 2021-02-10 MED ORDER — ENSURE ENLIVE PO LIQD
237.0000 mL | Freq: Three times a day (TID) | ORAL | Status: DC
  Administered 2021-02-10 – 2021-02-11 (×2): 237 mL via ORAL

## 2021-02-10 MED ORDER — SODIUM CHLORIDE 0.9 % IV SOLN: Freq: Once | INTRAVENOUS | Status: DC

## 2021-02-10 MED ORDER — CHLORHEXIDINE GLUCONATE CLOTH 2 % EX PADS
6.0000 | MEDICATED_PAD | Freq: Every day | CUTANEOUS | Status: DC
  Administered 2021-02-11 – 2021-02-14 (×4): 6 via TOPICAL

## 2021-02-10 MED ORDER — ADULT MULTIVITAMIN W/MINERALS CH
1.0000 | ORAL_TABLET | Freq: Every day | ORAL | Status: DC
  Administered 2021-02-10 – 2021-02-14 (×5): 1 via ORAL
  Filled 2021-02-10 (×5): qty 1

## 2021-02-10 MED ORDER — SODIUM CHLORIDE 0.9 % IV SOLN: INTRAVENOUS | Status: DC

## 2021-02-10 MED ORDER — GUAIFENESIN-DM 100-10 MG/5ML PO SYRP
5.0000 mL | ORAL_SOLUTION | ORAL | Status: DC | PRN
  Administered 2021-02-10: 5 mL via ORAL
  Filled 2021-02-10: qty 5

## 2021-02-10 MED ORDER — SODIUM CHLORIDE 0.9 % IV SOLN: 3.0000 g | Freq: Three times a day (TID) | INTRAVENOUS | Status: DC

## 2021-02-10 MED ORDER — SODIUM CHLORIDE 0.9 % IV SOLN
3.0000 g | Freq: Two times a day (BID) | INTRAVENOUS | Status: DC
  Administered 2021-02-10 – 2021-02-11 (×3): 3 g via INTRAVENOUS
  Filled 2021-02-10: qty 3
  Filled 2021-02-10 (×3): qty 8

## 2021-02-10 NOTE — Progress Notes (Signed)
PHARMACY NOTE:  ANTIMICROBIAL RENAL DOSAGE ADJUSTMENT  Current antimicrobial regimen includes a mismatch between antimicrobial dosage and estimated renal function.  As per policy approved by the Pharmacy & Therapeutics and Medical Executive Committees, the antimicrobial dosage will be adjusted accordingly.  Current antimicrobial dosage:  Unasyn 3g IV every 8 hours   Indication: Aspiration PNA  Renal Function:  Estimated Creatinine Clearance: 22.4 mL/min (A) (by C-G formula based on SCr of 1.95 mg/dL (H)).     Antimicrobial dosage has been changed to:  Unasyn 3g IV every 12 hours    Thank you for allowing pharmacy to be a part of this patient's care.  Pernell Dupre, PharmD, BCPS Clinical Pharmacist 02/10/2021 8:47 AM

## 2021-02-10 NOTE — Consult Note (Signed)
Consultation Note Date: 02/10/2021   Patient Name: Norma Hill  DOB: 06/15/1945  MRN: [id number]  Age / Sex: 75 y.o., female  PCP: Guadalupe Maple, MD Referring Physician: Terrilee Croak, MD  Reason for Consultation: Establishing goals of care and Psychosocial/spiritual support  HPI/Patient Profile: 75 y.o. female  with past medical history of A. fib sp successful electrocardioversion 25/0/0370, systolic heart failure, history of CVA in 2020 with subsequent memory loss, HTN/HLD, breast cancer 2002 with wide excision, admitted on 02/07/2021 with acute respiratory failure with hypoxia multifactorial bilateral atelectasis, chronic silent aspiration, suspected aspiration pneumonia, A. fib with RVR.   Clinical Assessment and Goals of Care: I have reviewed medical records including EPIC notes, labs and imaging, received report from RN, assessed the patient.  Norma Hill is sitting up in bed.  She greets me, making and somewhat keeping eye contact.  She is alert and oriented to person and place, time not I asked.  I believe that she is able to make her basic needs known.  There is no family at bedside at this time, but bedside nursing staff is present attending to needs.  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  We briefly talk about her acute health concerns but I reassured her that I will come back when her husband is present.  Met with husband, *Hal , Meliah Appleman and sister-in-law Jimmie, at bedside to discuss diagnosis prognosis, [place], EOL wishes, disposition and options.  I introduced Palliative Medicine as specialized medical care for people living with serious illness. It focuses on providing relief from the symptoms and stress of a serious illness. The goal is to improve quality of life for both the patient and the family.  We discussed a brief life review of the patient Mr. Norma Hill have been  married for 28 years.  She has had a gradual decline over the last few years with a marked decline over the last few weeks.    We then focused on their current illness.  Sister-in-law Laverna Peace is a retired Therapist, sports.  She seems quite knowledgeable about Norma Hill's acute on chronic health concerns.  We talked about her heart failure and fluid overload.  The natural disease trajectory and expectations at EOL were discussed.  We talked about some what if's and maybe's.  At this point Mr. Leu would want short-term rehab, accepting a bed at [facility].  They are agreeable to outpatient palliative services with [facility].  Provider choice offered.  We talked about what is next after short-term rehab.  Mr. Seifer anticipates need for long-term care.  He understands that this would be under private pay.  He shares that he has had to private pay for such care in the past.  He would like to transition to hospice care as soon as possible.  We talked about the concept of do not rehospitalize.  At this point family would like for Norma Hill to be transition to hospice home if/when she worsens, and not be brought back to the  hospital.  Advanced directives, concepts specific to code status, were considered and discussed.  They endorse DNR.  [place] form completed and placed on chart  Hospice and Palliative Care services outpatient were explained and offered.  Provider choice offered.  [facility] provider of choice. Family states that they would like residential hospice, but it does not seem that she would qualify for residential hospice today.  PMT to reevaluate tomorrow.  Discussed the importance of continued conversation with family and the medical providers regarding overall plan of care and treatment options, ensuring decisions are within the context of the patient's values and GOCs. Questions and concerns were addressed the family was encouraged to call with questions or concerns.  PMT will continue to support  holistically.  Conference with attending, bedside nursing staff, transition of care team related to patient condition, needs, goals of care, disposition.     HCPOA  NEXT OF KIN -husband, Norma Hill seems to lean on his sister who is a retired Barrister's clerk.    SUMMARY OF RECOMMENDATIONS   Accepting short-term rehab with outpatient palliative/[facility] Anticipate need for long-term care Transition to hospice when appropriate Agreeable to residential hospice when appropriate   Code Status/Advance Care Planning: DNR  Symptom Management:  Per hospitalist, no additional needs at this time.  Palliative Prophylaxis:  Frequent Pain Assessment and Oral Care  Additional Recommendations (Limitations, Scope, Preferences): Continue to treat the treatable but no CPR or intubation.  Psycho-social/Spiritual:  Desire for further Chaplaincy support:no Additional Recommendations: Caregiving  Support/Resources and Education on Hospice  Prognosis:  Unable to determine, based on outcomes.  Guarded at this point.  6 months or less would be anticipated based on chronic illness burden  Discharge Planning:  To be determined.  Family is considering short-term rehab versus home with hospice care.       Primary Diagnoses: Present on Admission: **None**   I have reviewed the medical record, interviewed the patient and family, and examined the patient. The following aspects are pertinent.  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 Pt then had a repeat excision on 11/16/2000   CHF (congestive heart failure) ([place])    Dementia ([place])    Hypertension 2006   Personal history of malignant neoplasm of breast 2002   Solitary cyst of breast 2012   Special screening for malignant neoplasms, colon    Social History   Socioeconomic  History   Marital status: Married    Spouse name: Not on file   Number of children: Not on file   Years of education: Not on file   Highest education level: Not on file  Occupational History   Not on file  Tobacco Use   Smoking status: Never    Passive exposure: Never   Smokeless tobacco: Never  Vaping Use   Vaping Use: Never used  Substance and Sexual Activity   Alcohol use: No   Drug use: No   Sexual activity: Not Currently  Other Topics Concern   Not on file  Social History Narrative   Not on file   Social Determinants of Health   Financial Resource Strain: Not on file  Food Insecurity: Not on file  Transportation Needs: Not on file  Physical Activity: Not on file  Stress: Not on file  Social Connections: Not on file  Family History  Problem Relation Age of Onset   Cancer Other        breast, relationship not listed   Dementia Mother    Diabetes Brother    Scheduled Meds:  amiodarone  200 mg Oral BID   apixaban  5 mg Oral BID   feeding supplement  237 mL Oral TID BM   mouth rinse  15 mL Mouth Rinse BID   multivitamin with minerals  1 tablet Oral Daily   rosuvastatin  10 mg Oral Daily   Continuous Infusions:  sodium chloride 50 mL/hr at 02/10/21 1024   ampicillin-sulbactam (UNASYN) IV 3 g (02/10/21 1147)   PRN Meds:.acetaminophen **OR** acetaminophen, guaiFENesin-dextromethorphan, magnesium hydroxide, ondansetron **OR** [DISCONTINUED] ondansetron (ZOFRAN) IV, traZODone Medications Prior to Admission:  Prior to Admission medications   Medication Sig Start Date End Date Taking? Authorizing Provider  acetaminophen (TYLENOL) 325 MG tablet Take 2 tablets (650 mg total) by mouth every 6 (six) hours as needed for mild pain (or Fever >/= 101). 12/20/18  Yes Wieting, Richard, MD  apixaban (ELIQUIS) 5 MG TABS tablet Take 1 tablet (5 mg total) by mouth 2 (two) times daily. 04/13/20  Yes Cannady, Henrine Screws T, NP  aspirin EC 81 MG EC tablet Take 1 tablet (81 mg total) by  mouth daily. 12/10/18  Yes Dustin Flock, MD  diltiazem (CARDIZEM CD) 180 MG 24 hr capsule Take 1 capsule (180 mg total) by mouth 2 (two) times daily. 04/13/20  Yes Cannady, Jolene T, NP  diphenhydramine-acetaminophen (TYLENOL PM EXTRA STRENGTH) 25-500 MG TABS tablet Take 1 tablet by mouth at bedtime as needed.   Yes [provider]  lisinopril (ZESTRIL) 5 MG tablet Take 1 tablet by mouth daily. 02/07/18  Yes [provider]  metoprolol succinate (TOPROL-XL) 25 MG 24 hr tablet Take 1 tablet (25 mg total) by mouth daily. 04/13/20  Yes Cannady, Jolene T, NP  rosuvastatin (CRESTOR) 10 MG tablet Take 1 tablet (10 mg total) by mouth daily. 04/13/20  Yes Cannady, Henrine Screws T, NP  flecainide (TAMBOCOR) 50 MG tablet Take 1 tablet by mouth 2 (two) times daily. Patient not taking: Reported on 02/07/2021 01/25/18   [provider]  polyethylene glycol (MIRALAX / GLYCOLAX) 17 g packet Take 17 g by mouth 2 (two) times daily. Patient not taking: Reported on 04/27/2020 12/20/18   Loletha Grayer, MD  senna (SENOKOT) 8.6 MG TABS tablet Take 2 tablets by mouth 2 (two) times daily. Patient not taking: Reported on 04/27/2020    [provider]  tamsulosin (FLOMAX) 0.4 MG CAPS capsule Take 1 capsule (0.4 mg total) by mouth daily. Patient not taking: Reported on 02/07/2021 04/13/20   Venita Lick, NP   No Known Allergies Review of Systems  Unable to perform ROS: Age   Physical Exam Vitals and nursing note reviewed.  Constitutional:      General: She is not in acute distress.    Appearance: Normal appearance. She is not ill-appearing.  HENT:     Head: Normocephalic and atraumatic.     Mouth/Throat:     Mouth: Mucous membranes are moist.  Cardiovascular:     Rate and Rhythm: Normal rate.  Pulmonary:     Effort: Pulmonary effort is normal. No respiratory distress.  Neurological:     Mental Status: She is alert.     Comments: Oriented to person and place, time not asked   Psychiatric:        Mood and Affect: Mood normal.  Behavior: Behavior normal.     Comments: Calm and cooperative    Vital Signs: BP 120/60   Pulse 68   Temp 98.2 F (36.8 C) (Oral)   Resp 20   Ht 5' 2"  (1.575 m)   Wt 71.7 kg   SpO2 96%   BMI 28.91 kg/m  Pain Scale: 0-10   Pain Score: 0-No pain   SpO2: SpO2: 96 % O2 Device:SpO2: 96 % O2 Flow Rate: .O2 Flow Rate (L/min): 4 L/min  IO: Intake/output summary:  Intake/Output Summary (Last 24 hours) at 02/10/2021 1324 Last data filed at 02/10/2021 1024 Gross per 24 hour  Intake 240 ml  Output 625 ml  Net -385 ml    LBM: Last BM Date: 02/08/21 Baseline Weight: Weight: 65.8 kg Most recent weight: Weight: 71.7 kg     Palliative Assessment/Data:   Flowsheet Rows    Flowsheet Row Most Recent Value  Intake Tab   Referral Department Hospitalist  Unit at Time of Referral Cardiac/Telemetry Unit  Palliative Care Primary Diagnosis Cardiac  Date Notified 02/09/21  Palliative Care Type New Palliative care  Reason for referral Clarify Goals of Care  Date of Admission 02/07/21  Date first seen by Palliative Care 02/10/21  # of days Palliative referral response time 1 Day(s)  # of days IP prior to Palliative referral 2  Clinical Assessment   Palliative Performance Scale Score 40%  Pain Max last 24 hours Not able to report  Pain Min Last 24 hours Not able to report  Dyspnea Max Last 24 Hours Not able to report  Dyspnea Min Last 24 hours Not able to report  Psychosocial & Spiritual Assessment   Palliative Care Outcomes        Time In: 1310 Time Out: 1440  Time Total: 90 minutes  Greater than 50%  of this time was spent counseling and coordinating care related to the above assessment and plan.  Signed by: Drue Novel, NP   Please contact Palliative Medicine Team phone at [phone] for questions and concerns.  For individual provider: See Shea Evans

## 2021-02-10 NOTE — Progress Notes (Signed)
Physical Therapy Treatment Patient Details Name: Norma Hill MRN: [id number] DOB: Dec 22, 1945 Today's Date: 02/10/2021   History of Present Illness pt is a 75 y.o. female with a past medical history of atrial fibrillation anticoagulated on Eliquis, heart failure with preserved ejection fraction, hypertension, history of CVA in 2020 who presented to the ED on 12/5 with chief complaint of progressively worsening shortness of breath and generalized weakness. Underwent cardioversion.    PT Comments    Pt with eyes closed throughout majority of session. Able to open on command and follow simple commands. Oriented to self, place, denied pain but did exhibit mild pain signs/symptoms with mobility attempts. She was able to participate in a few bed level exercises, though noted increased RR and reported fatigue. spO2 to 89% on 5L, recovered to >90% with rest. Supine <> sit with mod-maxA, minA to maintain seated balance. Further mobility deferred at this point due to patient fatigue/lethargy, pt unable to keep her eyes open, returned to supine and vitals assessed. HR, SpO2 and BP WFLs. Overall, compared to initial evaluation the patient has experienced an acute decline in function and is requiring more assistance. Pt was able to ambulate in room with no AD and CGA two days ago. Husband reported he is unable to assist her at this current level. The patient may benefit from a recommendation to SNF to attempt to return to PLOF; POC/goals for pt to be discussed later this PM. Family does endorse a decline for patient in the last several months, especially the in the last month.     Recommendations for follow up therapy are one component of a multi-disciplinary discharge planning process, led by the attending physician.  Recommendations may be updated based on patient status, additional functional criteria and insurance authorization.  Follow Up Recommendations  Skilled nursing-short term rehab (<3  hours/day)     Assistance Recommended at Discharge Frequent or constant Supervision/Assistance  Equipment Recommendations  Rolling walker (2 wheels)    Recommendations for Other Services OT consult     Precautions / Restrictions Precautions Precautions: Fall Restrictions Weight Bearing Restrictions: No     Mobility  Bed Mobility Overal bed mobility: Needs Assistance Bed Mobility: Supine to Sit     Supine to sit: Mod assist;HOB elevated Sit to supine: Max assist   General bed mobility comments: cues to attend to task/stay awake. Pt able to initiate movement but ultimately requiring quite a bit of physical assist to complete    Transfers                   General transfer comment: unable at this due to to pt fatigue/lethargy    Ambulation/Gait                   Stairs             Wheelchair Mobility    Modified Rankin (Stroke Patients Only)       Balance Overall balance assessment: Needs assistance Sitting-balance support: Feet unsupported;Bilateral upper extremity supported Sitting balance-Leahy Scale: Poor Sitting balance - Comments: minA to sit EOB                                    Cognition Arousal/Alertness: Lethargic Behavior During Therapy: Flat affect;WFL for tasks assessed/performed  General Comments: pt oriented to self, palce (hospital), disoriented x2        Exercises      General Comments        Pertinent Vitals/Pain Pain Assessment: Faces Faces Pain Scale: Hurts a little bit Pain Location: with sitting EOB Pain Intervention(s): Limited activity within patient's tolerance;Monitored during session;Repositioned    Home Living                          Prior Function            PT Goals (current goals can now be found in the care plan section) Progress towards PT goals: Not progressing toward goals - comment (has experienced a  decline acutely, TBD if goals need to be downgraded)    Frequency    Min 2X/week      PT Plan Discharge plan needs to be updated    Co-evaluation              AM-PAC PT "6 Clicks" Mobility   Outcome Measure  Help needed turning from your back to your side while in a flat bed without using bedrails?: A Lot Help needed moving from lying on your back to sitting on the side of a flat bed without using bedrails?: A Lot Help needed moving to and from a bed to a chair (including a wheelchair)?: A Lot Help needed standing up from a chair using your arms (e.g., wheelchair or bedside chair)?: A Lot Help needed to walk in hospital room?: Total Help needed climbing 3-5 steps with a railing? : Total 6 Click Score: 10    End of Session   Activity Tolerance: Patient limited by lethargy Patient left: in bed;with call bell/phone within reach;with bed alarm set;with family/visitor present Nurse Communication: Mobility status PT Visit Diagnosis: Other abnormalities of gait and mobility (R26.89);Difficulty in walking, not elsewhere classified (R26.2);Muscle weakness (generalized) (M62.81)     Time: 2197-5883 PT Time Calculation (min) (ACUTE ONLY): 23 min  Charges:  $Therapeutic Exercise: 8-22 mins $Therapeutic Activity: 8-22 mins                     Lieutenant Diego PT, DPT 9:12 AM,02/10/21

## 2021-02-10 NOTE — TOC Progression Note (Signed)
Transition of Care [facility]) - Progression Note    Patient Details  Name: Torra Pala MRN: [id number] Date of Birth: [birth year]  Transition of Care [facility]) CM/SW Contact  Eileen Stanford, LCSW Phone Number: 02/10/2021, 3:39 PM  Clinical Narrative:   CSW spoke with pt's spouse and he did not want pt to go to [facility]. However, after this conversation, Palliative rep Quinn Axe spoke with family and now they are agreeable to [facility]. CSW has started [place], unsure that it will be obtained due to decline in pt's physical status.    Expected Discharge Plan:  (to be deteremined) Barriers to Discharge: Continued Medical Work up  Expected Discharge Plan and Services Expected Discharge Plan:  (to be deteremined)   Discharge Planning Services: CM Consult Post Acute Care Choice: [place] arrangements for the past 2 months: Single Family Home                 DME Arranged: N/A DME Agency: NA                   Social Determinants of Health (SDOH) Interventions    Readmission Risk Interventions No flowsheet data found.

## 2021-02-10 NOTE — Progress Notes (Signed)
SLP Cancellation Note  Patient Details Name: Aleila Syverson MRN: [id number] DOB: 1945-10-30   Cancelled treatment:       Reason Eval/Treat Not Completed: Patient at unavailable. Pt with medical providers. Noted per chart review, ongoing [place] discussions. SLP to continue efforts, as appropriate.   Cherrie Gauze, M.S., [facility] [phone] [place])  Quintella Baton 02/10/2021, 8:33 AM

## 2021-02-10 NOTE — Evaluation (Addendum)
Clinical/Bedside Swallow Evaluation Patient Details  Name: Norma Hill MRN: [id number] Date of Birth: Apr 10, 1945  Today's Date: 02/10/2021 Time: SLP Start Time (ACUTE ONLY): 0900 SLP Stop Time (ACUTE ONLY): 0930 SLP Time Calculation (min) (ACUTE ONLY): 30 min  Past Medical History:  Past Medical History:  Diagnosis Date   A-fib ([place])    Bowel trouble 2012   ocasionally   Cancer ([place]) 10/2000   pt had 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 Pt then had a repeat excision on 11/16/2000   CHF (congestive heart failure) ([place])    Dementia ([place])    Hypertension 2006   Personal history of malignant neoplasm of breast 2002   Solitary cyst of breast 2012   Special screening for malignant neoplasms, colon    Past Surgical History:  Past Surgical History:  Procedure Laterality Date   BREAST BIOPSY Right 2002   BREAST SURGERY Right 2002   wide excision and sn x4 done 10/29/00 with repeat excision done on 11/16/2000 with post operative radiation treatment and 5 years of tamoxifen with completion in 2007   CARDIOVERSION N/A 02/09/2021   Procedure: CARDIOVERSION;  Surgeon: Corey Skains, MD;  Location: [facility] ORS;  Service: Cardiovascular;  Laterality: N/A;   CHOLECYSTECTOMY  2002   COLONOSCOPY  2008   DILATION AND CURETTAGE OF UTERUS  1990,s   ELECTROPHYSIOLOGIC STUDY N/A 03/16/2016   Procedure: CARDIOVERSION;  Surgeon: Isaias Cowman, MD;  Location: [facility] ORS;  Service: Cardiovascular;  Laterality: N/A;   HPI:  Per IEPPIRJJO'A H&P "Norma Hill is a 75 y.o. female with medical history significant for atrial fibrillation, n hypertension and breast cancer, who presented to emergency room with acute onset of dyspnea with associated dry cough without wheezing with paroxysmal nocturnal dyspnea as well as dyspnea on exertion with associated generalized weakness.  She has been having worsening lower extremity edema.  No fever or chills.   No nausea or vomiting or abdominal pain.  She has been having diminished p.o. intake lately.  She denies any palpitations or chest pain.  No dysuria, oliguria or hematuria or flank pain.        ED Course: Upon position to the emergency room blood pressure was 182/98 with a heart rate of 101 with otherwise normal vital signs.  Labs revealed hypokalemia 3 otherwise unremarkable BMP.  LFTs with 1.6 total bili with 1.2 indirect bili 0.4 direct bili.  I was antigens and COVID-19 PCR came back negative.  TSH was 0.67.     EKG showed atrial fibrillation with rapid ventricular sponsor of 150 with PVCs.  There were lateral ST segment depression.     Imaging: Chest x-ray showed small right greater than left pleural effusion, cardiomegaly with mild interstitial prominence concerning for heart failure and volume overload.    The patient was given 20 g of IV Lasix and 15 mg of IV Cardizem bolus followed by 120 p.o. Cardizem.  She will be admitted to a cardiac telemetry bed for further evaluation and management."    Assessment / Plan / Recommendation  Clinical Impression  Pt seen for clinical swallowing evaluation. Pt alert, slow to respond. Intermittently closing eyes throughout evaluation. Oriented to self and location. Husband at bedside and providing case hx for patient. Both pt and husband deny s/sx oral or pharyngeal dysphagia. Husband noted pt with abdominal distention for about 2 weeks PTA which persists. Husband reports pt with poor PO intake during this admission and PTA. Oral motor examination  is unremarkable. Pt on 5L/min O2 via [place]. Persistent, congested, non-productive cough noted throughout evaluation.   Pt observed with items from meal tray including pancakes (approx 1/4 pancake), water via straw sip (~2 oz), and coffee (~2 oz). Pt able to feed self with set up assistance. Oral phase was functional across trials. To palpation, swallow initiation appeared timely and laryngeal elevation appeared adequate. No  changes to vocal quality across trials. Delayed, non-productive and congested cough noted during observation which appears consistent with baseline cough, ?relationship to POs.   Per chart review, temp WNL and WBC elevated. CXR 02/09/21 "Interval development of patchy airspace opacity within the right  perihilar region consistent with developing infection/inflammation.  Followup PA and lateral chest X-ray is recommended in 3-4 weeks  following therapy to ensure resolution." CXR findings not typically associated with aspiration PNA as aspiration in gravity dependent.   Recommend mech soft diet with thin liquids with safe swallowing strategies/aspiration precautions as outlined below. Recommend set up assistance with meals to allow pt to self feed. Mech soft diet recommended to help promote self-feeding and for energy conservation.   Pt is at increased risk for aspiration/aspiration PNA given respiratory status, neuro hx, and waxing/waning LOA. Concern for pt's ability to meet nutritional demands. Registered Dietician consult placed.  Given pt's clinical presentation and risk factors, SLP to f/u for diet tolerance.   Noted SLP consult for speech/language evaluation. Screening completed. Pt with hx of cognitive-linguistic deficits in setting of hx of stroke and dementia. If pt with changes to cognitive-linguistic functioning (not endorsed by pt or husband), consider post-acute SLP evaluation of cognitive-linguistic functioning.   Pt, pt's husband, and RN made aware of results recommendation, safe swallowing strategies/aspiration precautions, and SLP POC. Pt and husband verbalized understanding/agreement.  SLP Visit Diagnosis: Dysphagia, pharyngeal phase (R13.13)    Aspiration Risk  Moderate aspiration risk    Diet Recommendation Dysphagia 3 (Mech soft);Thin liquid   Medication Administration:  (as tolerated) Supervision: Patient able to self feed;Intermittent supervision to cue for compensatory  strategies (with set up) Compensations: Minimize environmental distractions;Slow rate;Small sips/bites Postural Changes: Seated upright at 90 degrees    Other  Recommendations Recommended Consults:  (Registered Dietician) Oral Care Recommendations: Oral care BID;Oral care before and after PO    Recommendations for follow up therapy are one component of a multi-disciplinary discharge planning process, led by the attending physician.  Recommendations may be updated based on patient status, additional functional criteria and insurance authorization.  Follow up Recommendations Acute inpatient rehab (3hours/day)      Assistance Recommended at Discharge Frequent or constant Supervision/Assistance  Functional Status Assessment Patient has had a recent decline in their functional status and demonstrates the ability to make significant improvements in function in a reasonable and predictable amount of time.  Frequency and Duration min 2x/week  2 weeks       Prognosis Prognosis for Safe Diet Advancement: Fair Barriers to Reach Goals: Cognitive deficits      Swallow Study   General Date of Onset: 02/07/21 HPI: Per [id number] H&P "Melrose Higgs Shindler is a 75 y.o. female with medical history significant for atrial fibrillation, n hypertension and breast cancer, who presented to emergency room with acute onset of dyspnea with associated dry cough without wheezing with paroxysmal nocturnal dyspnea as well as dyspnea on exertion with associated generalized weakness.  She has been having worsening lower extremity edema.  No fever or chills.  No nausea or vomiting or abdominal pain.  She has been having  diminished p.o. intake lately.  She denies any palpitations or chest pain.  No dysuria, oliguria or hematuria or flank pain.        ED Course: Upon position to the emergency room blood pressure was 182/98 with a heart rate of 101 with otherwise normal vital signs.  Labs revealed hypokalemia 3 otherwise  unremarkable BMP.  LFTs with 1.6 total bili with 1.2 indirect bili 0.4 direct bili.  I was antigens and COVID-19 PCR came back negative.  TSH was 0.67.     EKG showed atrial fibrillation with rapid ventricular sponsor of 150 with PVCs.  There were lateral ST segment depression.     Imaging: Chest x-ray showed small right greater than left pleural effusion, cardiomegaly with mild interstitial prominence concerning for heart failure and volume overload.    The patient was given 20 g of IV Lasix and 15 mg of IV Cardizem bolus followed by 120 p.o. Cardizem.  She will be admitted to a cardiac telemetry bed for further evaluation and management." Type of Study: Bedside Swallow Evaluation Previous Swallow Assessment: clinical swallowing evaluation 12/25/18 recommended regular diet with tihn liquids Diet Prior to this Study: Regular;Thin liquids Temperature Spikes Noted: No Respiratory Status: Nasal cannula (5L/min) Behavior/Cognition: Alert;Cooperative;Pleasant mood (slow to respond) Oral Cavity Assessment: Within Functional Limits Oral Care Completed by SLP: Yes Oral Cavity - Dentition: Adequate natural dentition Vision: Functional for self-feeding Self-Feeding Abilities: Able to feed self;Needs set up Patient Positioning: Upright in bed Baseline Vocal Quality: Normal Volitional Cough: Strong Volitional Swallow: Able to elicit    Oral/Motor/Sensory Function Overall Oral Motor/Sensory Function: Within functional limits   Thin Liquid Thin Liquid: Within functional limits Presentation: Cup;Straw;Self Fed Other Comments: delayed, non-productive/congested cough; ?relationship to PO    Solid     Solid: Within functional limits Presentation: Self Fed Other Comments: delayed, non-productive/congested cough; ?relationship to PO     Cherrie Gauze, M.S., [facility] [phone] (ASCOM)  Clearnce Sorrel Janaiah Vetrano 02/10/2021,10:45  AM

## 2021-02-10 NOTE — NC FL2 (Signed)
[place] LEVEL OF CARE SCREENING TOOL     IDENTIFICATION  Patient Name: Norma Hill Birthdate: 1945/12/12 Sex: female Admission Date (Current Location): 02/07/2021  [facility] and [place] Number:  Engineering geologist and Address:  [facility], [address], [place], [place] [address]      Provider Number: [id number]  Attending Physician Name and Address:  Terrilee Croak, MD  Relative Name and Phone Number:       Current Level of Care: Hospital Recommended Level of Care: [place] Prior Approval Number:    Date Approved/Denied:   PASRR Number: [id number]  Discharge Plan: SNF    Current Diagnoses: Patient Active Problem List   Diagnosis Date Noted   Acute CHF (congestive heart failure) ([place]) 02/07/2021   Bleeding 04/13/2020   Frequent bowel movements 04/13/2020   At high risk for falls 51/70/0174   Systolic heart failure ([place]) 03/10/2019   Elevated LDL cholesterol level 03/10/2019   Dementia ([place]) 12/19/2018   History of stroke 12/19/2018   Atrial fibrillation with RVR ([place]) 09/29/2018   Essential hypertension 02/22/2016   Personal history of malignant neoplasm of breast     Orientation RESPIRATION BLADDER Height & Weight     Self, Place  O2 ([place] 4L) Incontinent Weight: 158 lb 1.1 oz (71.7 kg) Height:  5\' 2"  (157.5 cm)  BEHAVIORAL SYMPTOMS/MOOD NEUROLOGICAL BOWEL NUTRITION STATUS      Continent Diet (DYS 3 diet, thin liquids)  AMBULATORY STATUS COMMUNICATION OF NEEDS Skin   Extensive Assist Verbally Normal                       Personal Care Assistance Level of Assistance  Bathing, Feeding, Dressing Bathing Assistance: Maximum assistance Feeding assistance: Limited assistance Dressing Assistance: Maximum assistance     Functional Limitations Info  Sight, Hearing, Speech Sight Info: Adequate Hearing Info: Adequate Speech Info: Adequate    SPECIAL CARE FACTORS FREQUENCY  PT (By licensed  PT), OT (By licensed OT)     PT Frequency: 5x OT Frequency: 5x            Contractures Contractures Info: Not present    Additional Factors Info  Code Status, Allergies Code Status Info: DNR Allergies Info: no known allergies           Current Medications (02/10/2021):  This is the current hospital active medication list Current Facility-Administered Medications  Medication Dose Route Frequency Provider Last Rate Last Admin   0.9 %  sodium chloride infusion   Intravenous Once Terrilee Croak, MD 50 mL/hr at 02/10/21 1024 New Bag at 02/10/21 1024   acetaminophen (TYLENOL) tablet 650 mg  650 mg Oral Q6H PRN Mansy, Jan A, MD       Or   acetaminophen (TYLENOL) suppository 650 mg  650 mg Rectal Q6H PRN Mansy, Jan A, MD       amiodarone (PACERONE) tablet 200 mg  200 mg Oral BID Corey Skains, MD   200 mg at 02/10/21 1003   Ampicillin-Sulbactam (UNASYN) 3 g in sodium chloride 0.9 % 100 mL IVPB  3 g Intravenous Q12H Hallaji, Sheema M, RPH       apixaban (ELIQUIS) tablet 5 mg  5 mg Oral BID Mansy, Jan A, MD   5 mg at 02/10/21 1003   guaiFENesin-dextromethorphan (ROBITUSSIN DM) 100-10 MG/5ML syrup 5 mL  5 mL Oral Q4H PRN Terrilee Croak, MD   5 mL at 02/10/21 0404   magnesium hydroxide (  MILK OF MAGNESIA) suspension 30 mL  30 mL Oral Daily PRN Mansy, Jan A, MD       MEDLINE mouth rinse  15 mL Mouth Rinse BID Dahal, Binaya, MD   15 mL at 02/10/21 1003   ondansetron (ZOFRAN) tablet 4 mg  4 mg Oral Q6H PRN Mansy, Jan A, MD   4 mg at 02/09/21 1101   rosuvastatin (CRESTOR) tablet 10 mg  10 mg Oral Daily Mansy, Jan A, MD   10 mg at 02/10/21 1003   traZODone (DESYREL) tablet 25 mg  25 mg Oral QHS PRN Mansy, Jan A, MD   25 mg at 02/07/21 2351     Discharge Medications: Please see discharge summary for a list of discharge medications.  Relevant Imaging Results:  Relevant Lab Results:   Additional Information [id number]  Gerrianne Scale Dekker Verga, LCSW

## 2021-02-10 NOTE — Progress Notes (Signed)
Initial Nutrition Assessment  DOCUMENTATION CODES:   Not applicable  INTERVENTION:   -Ensure Enlive po TID, each supplement provides 350 kcal and 20 grams of protein  -MVI with minerals daily  NUTRITION DIAGNOSIS:   Inadequate oral intake related to decreased appetite as evidenced by per patient/family report.  GOAL:   Patient will meet greater than or equal to 90% of their needs  MONITOR:   PO intake, Supplement acceptance, Diet advancement, Labs, Weight trends, Skin, I & O's  REASON FOR ASSESSMENT:   Consult Assessment of nutrition requirement/status  ASSESSMENT:   Norma Hill is a 75 y.o. female with medical history significant for atrial fibrillation, n hypertension and breast cancer, who presented to emergency room with acute onset of dyspnea with associated dry cough without wheezing with paroxysmal nocturnal dyspnea as well as dyspnea on exertion with associated generalized weakness.  She has been having worsening lower extremity edema.  No fever or chills.  No nausea or vomiting or abdominal pain.  She has been having diminished p.o. intake lately.  She denies any palpitations or chest pain.  No dysuria, oliguria or hematuria or flank pain.   Pt admitted with CHF.   12/7- s/p cardioversion 12/8- s/p BSE- dysphagia 3 diet with thin liquids  Reviewed I/O's: +510 ml x 24 hours and +1.3 L since admission  UOP: 635 ml x 24 hours   Spoke with pt at bedside, who was sleepy at time of visit but answered close ended questions. She reports poor appetite for the past 3 days, states she just does not feel like eating and feels more comfortable drinking fluids at this time. She denies difficulty with currently diet consistency and noted coffee on tray table. RD also provided ice water per her request. Noted meal completions 10-100%.  Pt reports good appetite PTA, consuming 3 meals per day (Breakfast: eggs; Lunch: sandwich, and Dinner: meat, starch, and vegetable).   Pt  denies any weight loss. Reviewed wt hx; wt has been stable over the past 10 months.   Medications reviewed and include 0.9% sodium chloride infusion.   Labs reviewed.   NUTRITION - FOCUSED PHYSICAL EXAM:  Flowsheet Row Most Recent Value  Orbital Region No depletion  Upper Arm Region No depletion  Thoracic and Lumbar Region No depletion  Buccal Region No depletion  Temple Region No depletion  Clavicle Bone Region No depletion  Clavicle and Acromion Bone Region No depletion  Scapular Bone Region No depletion  Dorsal Hand No depletion  Patellar Region No depletion  Anterior Thigh Region No depletion  Posterior Calf Region No depletion  Edema (RD Assessment) Mild  Hair Reviewed  Eyes Reviewed  Mouth Reviewed  Skin Reviewed  Nails Reviewed       Diet Order:   Diet Order             DIET DYS 3 Room service appropriate? Yes; Fluid consistency: Thin  Diet effective now                   EDUCATION NEEDS:   Education needs have been addressed  Skin:  Skin Assessment: Reviewed RN Assessment  Last BM:  02/08/21  Height:   Ht Readings from Last 1 Encounters:  02/08/21 5\' 2"  (1.575 m)    Weight:   Wt Readings from Last 1 Encounters:  02/10/21 71.7 kg    Ideal Body Weight:  50 kg  BMI:  Body mass index is 28.91 kg/m.  Estimated Nutritional Needs:   Kcal:  1800-2000  Protein:  90-105 grams  Fluid:  > 1.8 L    Loistine Chance, RD, LDN, [place] Registered Dietitian II Certified Diabetes Care and Education Specialist Please refer to [facility] for RD and/or RD on-call/weekend/after hours pager

## 2021-02-10 NOTE — Progress Notes (Addendum)
[facility] Encounter Note  Patient: Norma Hill / Admit Date: 02/07/2021 / Date of Encounter: 02/10/2021, 10:19 AM   Subjective: Patient is comfortably resting with eyes closed during majority of interview.  Husband present at bedside.  Since stopping metoprolol and Cardizem patient's heart rate has been stable.  She has fortunately been in normal sinus rhythm since her cardioversion 12/7.  Of note, patient's creatinine has risen from 0.86 to 1.95 overnight and eGFR is 26 (previously >60).   Review of Systems: Positive for: Cough Negative for: Vision change, hearing change, syncope, dizziness, nausea, vomiting,diarrhea, bloody stool, stomach pain, cough, congestion, diaphoresis, urinary frequency, urinary pain,skin lesions, skin rashes Others previously listed  Objective: Telemetry: Normal sinus rhythm with ventricular rate of 69 bpm.  Physical Exam: Blood pressure 118/66, pulse 69, temperature (!) 97.2 F (36.2 C), resp. rate 20, height 5' 2"  (1.575 m), weight 71.7 kg, SpO2 95 %. Body mass index is 28.91 kg/m. General: Elderly and frail appearing Caucasian female, in no acute distress laying nearly flat in bed with eyes closed.  Husband present at bedside Head: Normocephalic, atraumatic, sclera non-icteric, no xanthomas,  Neck: No apparent masses Lungs: Poor air movement and wet cough with deep inspiration and some expiratory wheezes, no rhonchi, no rales.  On oxygen by nasal cannula.  Heart: Regular rate and rhythm, normal S1 S2, no murmur, no rub, no gallop,  Abdomen: non-distended appearing. No apparent hepatosplenomegaly.  Extremities: 1+ bilateral lower extremity edema, no clubbing, no cyanosis, no ulcers Peripheral: 2+ radial, 2+ dorsal pedal pulses Neuro: Somnolent but easily arousable.  Moves all extremities spontaneously. Psych:  Responds to questions appropriately with a normal affect.    No intake or output data in the 24 hours ending  02/10/21 1019  Inpatient Medications:   amiodarone  200 mg Oral BID   apixaban  5 mg Oral BID   mouth rinse  15 mL Mouth Rinse BID   rosuvastatin  10 mg Oral Daily   Infusions:   sodium chloride     ampicillin-sulbactam (UNASYN) IV      Labs: Recent Labs    02/09/21 0309 02/10/21 0503  NA 135 136  K 3.6 4.0  CL 103 102  CO2 26 25  GLUCOSE 120* 126*  BUN 22 37*  CREATININE 0.86 1.95*  CALCIUM 8.8* 8.8*   No results for input(s): AST, ALT, ALKPHOS, BILITOT, PROT, ALBUMIN in the last 72 hours. Recent Labs    02/09/21 0309 02/10/21 0503  WBC 13.7* 15.4*  NEUTROABS 10.6* 12.1*  HGB 12.3 11.5*  HCT 37.8 35.4*  MCV 89.8 89.8  PLT 236 236   No results for input(s): CKTOTAL, CKMB, TROPONINI in the last 72 hours. Invalid input(s): POCBNP No results for input(s): HGBA1C in the last 72 hours.   Weights: Filed Weights   02/08/21 0500 02/08/21 0636 02/10/21 0350  Weight: 65 kg 71.2 kg 71.7 kg     Radiology/Studies:  DG Chest 2 View  Result Date: 02/06/2021 CLINICAL DATA:  A 75 year old female presents for evaluation of cough and weakness. EXAM: CHEST - 2 VIEW COMPARISON:  December 24, 2018. FINDINGS: Cardiomediastinal contours remain enlarged. Hilar structures are stable. No lobar consolidative process. No visible pneumothorax. Small RIGHT-sided pleural effusion and likely small LEFT-sided pleural effusion. Mild interstitial prominence without gross edema. On limited assessment no acute skeletal process. IMPRESSION: Small RIGHT greater than LEFT pleural effusions. Cardiomegaly with mild interstitial prominence, consider correlation with any signs of heart failure or volume overload. Electronically  Signed   By: Zetta Bills M.D.   On: 02/06/2021 20:24   DG Chest Port 1 View  Result Date: 02/09/2021 CLINICAL DATA:  Dyspnea EXAM: PORTABLE CHEST 1 VIEW COMPARISON:  Chest x-ray 02/06/21, CT chest 09/11/2017 FINDINGS: The heart and mediastinal contours are unchanged. Aortic  calcification. Interval development of patchy airspace opacity within the right perihilar region/right middle lobe. No focal consolidation. No pulmonary edema. No pleural effusion. No pneumothorax. No acute osseous abnormality. IMPRESSION: Interval development of patchy airspace opacity within the right perihilar region consistent with developing infection/inflammation. Followup PA and lateral chest X-ray is recommended in 3-4 weeks following therapy to ensure resolution. Electronically Signed   By: Iven Finn M.D.   On: 02/09/2021 16:49   ECHOCARDIOGRAM COMPLETE  Result Date: 02/07/2021    ECHOCARDIOGRAM REPORT   Patient Name:   [name] Date of Exam: 02/07/2021 Medical Rec #:  [id number]          Height:       62.0 in Accession #:    [id number]         Weight:       145.0 lb Date of Birth:  01-Feb-1946          BSA:          1.667 m Patient Age:    65 years           BP:           123/84 mmHg Patient Gender: F                  HR:           75 bpm. Exam Location:  [facility] Procedure: 2D Echo, Color Doppler and Cardiac Doppler Indications:     I50.21 congestive heart failure-Acute Systolic  History:         Patient has prior history of Echocardiogram examinations.                  Arrythmias:Atrial Fibrillation, Signs/Symptoms:Shortness of                  Breath and Edema; Risk Factors:Hypertension.  Sonographer:     Charmayne Sheer Referring Phys:  [id number] [place] Diagnosing Phys: Serafina Royals MD  Sonographer Comments: Suboptimal apical window and no subcostal window. IMPRESSIONS  1. Left ventricular ejection fraction, by estimation, is 60 to 65%. The left ventricle has normal function. The left ventricle has no regional wall motion abnormalities. Left ventricular diastolic parameters were normal.  2. Right ventricular systolic function is normal. The right ventricular size is mildly enlarged.  3. Left atrial size was mildly dilated.  4. Right atrial size was mildly dilated.  5. The mitral valve is  normal in structure. Trivial mitral valve regurgitation.  6. The aortic valve is normal in structure. Aortic valve regurgitation is not visualized. FINDINGS  Left Ventricle: Left ventricular ejection fraction, by estimation, is 60 to 65%. The left ventricle has normal function. The left ventricle has no regional wall motion abnormalities. The left ventricular internal cavity size was normal in size. There is  no left ventricular hypertrophy. Left ventricular diastolic parameters were normal. Right Ventricle: The right ventricular size is mildly enlarged. No increase in right ventricular wall thickness. Right ventricular systolic function is normal. Left Atrium: Left atrial size was mildly dilated. Right Atrium: Right atrial size was mildly dilated. Pericardium: There is no evidence of pericardial effusion. Mitral Valve: The mitral valve is normal in structure.  Trivial mitral valve regurgitation. Tricuspid Valve: The tricuspid valve is normal in structure. Tricuspid valve regurgitation is mild. Aortic Valve: The aortic valve is normal in structure. Aortic valve regurgitation is not visualized. Aortic valve mean gradient measures 3.0 mmHg. Aortic valve peak gradient measures 5.7 mmHg. Aortic valve area, by VTI measures 2.53 cm. Pulmonic Valve: The pulmonic valve was normal in structure. Pulmonic valve regurgitation is trivial. Aorta: The aortic root and ascending aorta are structurally normal, with no evidence of dilitation. IAS/Shunts: No atrial level shunt detected by color flow Doppler.  LEFT VENTRICLE PLAX 2D LVIDd:         4.73 cm   Diastology LVIDs:         3.83 cm   LV e' medial:    12.30 cm/s LV PW:         0.94 cm   LV E/e' medial:  6.1 LV IVS:        0.81 cm   LV e' lateral:   13.30 cm/s LVOT diam:     2.10 cm   LV E/e' lateral: 5.7 LV SV:         54 LV SV Index:   33 LVOT Area:     3.46 cm  LEFT ATRIUM             Index LA diam:        4.70 cm 2.82 cm/m LA Vol (A2C):   52.0 ml 31.18 ml/m LA Vol (A4C):    95.2 ml 57.09 ml/m LA Biplane Vol: 75.7 ml 45.40 ml/m  AORTIC VALVE                    PULMONIC VALVE AV Area (Vmax):    2.20 cm     PV Vmax:       0.76 m/s AV Area (Vmean):   2.35 cm     PV Vmean:      48.200 cm/s AV Area (VTI):     2.53 cm     PV VTI:        0.107 m AV Vmax:           119.00 cm/s  PV Peak grad:  2.3 mmHg AV Vmean:          79.600 cm/s  PV Mean grad:  1.0 mmHg AV VTI:            0.215 m AV Peak Grad:      5.7 mmHg AV Mean Grad:      3.0 mmHg LVOT Vmax:         75.70 cm/s LVOT Vmean:        53.900 cm/s LVOT VTI:          0.157 m LVOT/AV VTI ratio: 0.73  AORTA Ao Root diam: 3.10 cm MITRAL VALVE               TRICUSPID VALVE MV Area (PHT): 5.57 cm    TR Peak grad:   26.0 mmHg MV Decel Time: 136 msec    TR Vmax:        255.00 cm/s MV E velocity: 75.42 cm/s MV A velocity: 39.40 cm/s  SHUNTS MV E/A ratio:  1.91        Systemic VTI:  0.16 m                            Systemic Diam: 2.10 cm Serafina Royals MD Electronically signed by Serafina Royals  MD Signature Date/Time: 02/07/2021/1:30:21 PM    Final      Assessment and Recommendation  75 y.o. female atrial fibrillation anticoagulated on Eliquis, HFpEF, hypertension, history of CVA in 2020 who presented to the ED on 12/5 with chief complaint of progressively worsening shortness of breath and generalized weakness.  Status post successful electrical cardioversion  02/10/2021 with Dr. Serafina Royals.   #Atrial fibrillation  -Continue amiodarone 200 mg p.o. twice daily for maintenance of sinus rhythm. -Continue anticoagulation with Eliquis for stroke risk reduction -Hold Cardizem and metoprolol at this time for bradycardia   #AKI -Agree to hold Lasix at this time and for a bolus of fluids as patient's creatinine has risen overnight and urine appears darker.  #Heart failure with preserved ejection fraction -Echocardiogram revealed estimated LVEF 60-65%.Its likely her atrial fibrillation is contributing to her heart failure symptoms. -Hold  Lasix at this time, lungs do not sound fluid overloaded   #Hypertension -Continue lisinopril   #Dyslipidemia -Continue crestor    #Hypokalemia -Continue to replete and monitor BMP  Patient seen and examined with Dr. Serafina Royals.  Signed, Orinda Kenner, PA-C  The patient has been interviewed and examined. I agree with assessment and plan above. Serafina Royals MD [facility]

## 2021-02-10 NOTE — Progress Notes (Signed)
PROGRESS NOTE  Norma Hill  DOB: [birth year]  PCP: Guadalupe Maple, MD MHD:[id number]  DOA: 02/07/2021  LOS: 3 days  Hospital Day: 4  Chief Complaint  Patient presents with   Weakness    Brief narrative: Norma Hill is a 75 y.o. female with PMH significant for HTN, A. fib, breast cancer. Patient presented to the ED on 12/4 with complaint of progressively worsening shortness of breath, dry cough, wheezing, bilateral lower edema, PND and generalized weakness.  In the ED, patient was afebrile, heart rate 101, blood pressure 182/98, breathing on room air Later in the ED, she went to A. fib with RVR, confirmed by EKG with heart rate up to 150s Labs with potassium low at 3, WBC slightly elevated 12.8 EKG showed A. fib with RVR, ST segment depression lateral leads Chest x-ray showed small bilateral pleural effusion, cardiomegaly, mild interstitial prominence concerning for heart failure and volume overload. Patient was started on IV Lasix, IV Cardizem drip and admitted to hospitalist service.  Subjective: Patient was seen and examined this morning. Sleepy, opens eyes on verbal command.  Oriented to place and person.  Husband at bedside.  Cardiology team was also available at the same time at bedside. Events of last 24 hours noted.  Since metoprolol and Cardizem were held last night, patient's heart rate and blood pressure improved. She continues to have wet cough and shallow respiratory effort.  On 3 to 4 L oxygen by nasal cannula. Labs this morning with slightly worse WBC count and significantly worse creatinine. Urinary catheter with dark yellow/concentrated urine. Patient has poor oral intake and poor hydration.  Assessment/Plan: Acute respiratory failure with hypoxia -Not oxygen dependent at home.  Currently requiring 3 to 4 L oxygen. -Multifactorial: Primarily bilateral atelectasis, chronic silent aspiration, may developing into aspiration pneumonia -Encourage  incentive spirometry, chest physiotherapy, ambulation. -Wean down as tolerated.  Suspect aspiration pneumonia -Patient has significant weakness and is not able to cough adequately.  Because of her dementia, patient probably has silent aspiration as well. -No fever but with elevated WBC count and procalcitonin, I would start her on presented antibiotics with Unasyn for aspiration pneumonia. -Speech therapy consultation obtained.  Currently on dysphagia 3 diet with thin liquid. -Continue to trend WBC count and procalcitonin. Recent Labs  Lab 02/06/21 1938 02/07/21 0710 02/08/21 0512 02/09/21 0309 02/10/21 0503  WBC 12.8* 13.9* 12.3* 13.7* 15.4*  PROCALCITON  --   --   --   --  0.57   AKI -Normal at baseline, elevated 1.95 today which is a significant change in last 24 hours.  It is probably because of poor oral intake, poor hydration and prolonged period of hypotension yesterday. -Start on normal saline at 50 mill per hour.  Encourage oral hydration.  Avoid dialysis for next 24 hours at least. Recent Labs    04/13/20 1421 04/27/20 1201 04/27/20 1235 02/06/21 1938 02/07/21 0710 02/08/21 0512 02/09/21 0309 02/10/21 0503  BUN 7* CANCELED 12 11 12 17 22  37*  CREATININE 0.74 CANCELED 0.88 0.59 0.66 0.68 0.86 1.95*   Chronic combined systolic and diastolic CHF -Previous echo from 2020 with EF 40 to 45%.  On admission, patient's respiratory symptoms were suspected to be due to CHF exacerbation.  She was given IV Lasix.  However she started to become hypotensive and hence Lasix was held. -Repeat echocardiogram showed improvement in EF to 60 to 65%. -On long run, she may benefit from diuresis.  However at this time, because of AKI, I  would choose to hydrate her and monitor for CHF symptoms.  A. fib with RVR -Status post DC cardioversion on 12/7.   -Prior to admission, patient was on metoprolol and Cardizem.   -After cardioversion, patient was started on amiodarone.  Because of  hypotension, metoprolol and Cardizem were stopped. -On Eliquis for anticoagulation.-Patient was on aspirin and has now been stopped.  Hypokalemia -Potassium level improved with replacement. Recent Labs  Lab 02/06/21 1938 02/06/21 2016 02/07/21 0710 02/08/21 0512 02/09/21 0309 02/10/21 0503  K 3.0*  --  2.9* 3.4* 3.6 4.0  MG  --  2.1  --   --   --   --    Dyslipidemia. -continue Crestor  Acute delirium-toxic metabolic encephalopathy Progressively worsening dementia -Patient's husband states that her mental status has been declining gradually for the last 2 years and was hard to take care of her at home prior to this admission.   -I had a long discussion with husband at bedside.  He wants to take care of her at home but he understands that she may be beyond that point now. -He is open to the conversation of long-term placement.  But wants to know insurance coverage and financial responsibility. -Plan B would be home with hospice.  He is still not ready for that but he sees it coming. -Social work consulted.   Mobility: physically independent prior to presentation but dementia has been worsening for last 2 years. Living condition: Lives at home with husband. Goals of care:   Code Status: DNR per chart Nutritional status: Body mass index is 28.91 kg/m.      Diet:  Diet Order             DIET DYS 3 Room service appropriate? Yes; Fluid consistency: Thin  Diet effective now                  DVT prophylaxis:   apixaban (ELIQUIS) tablet 5 mg   Antimicrobials: None Fluid: None Consultants: Cardiology Family Communication: Husband at bedside  Status is: Inpatient  Continue in-hospital care because: Weak, oxygen dependent, may need long-term placement. Level of care: Telemetry Cardiac   Dispo: The patient is from: Home              Anticipated d/c is to: Long-term placement versus home, pending social work              Patient currently is not medically stable to  d/c.   Difficult to place patient No     Infusions:   sodium chloride 50 mL/hr at 02/10/21 1024   ampicillin-sulbactam (UNASYN) IV       Scheduled Meds:  amiodarone  200 mg Oral BID   apixaban  5 mg Oral BID   mouth rinse  15 mL Mouth Rinse BID   rosuvastatin  10 mg Oral Daily    PRN meds: acetaminophen **OR** acetaminophen, guaiFENesin-dextromethorphan, magnesium hydroxide, ondansetron **OR** [DISCONTINUED] ondansetron (ZOFRAN) IV, traZODone   Antimicrobials: Anti-infectives (From admission, onward)    Start     Dose/Rate Route Frequency Ordered Stop   02/10/21 1000  Ampicillin-Sulbactam (UNASYN) 3 g in sodium chloride 0.9 % 100 mL IVPB       Note to Pharmacy: Renal dosing   3 g 200 mL/hr over 30 Minutes Intravenous Every 12 hours 02/10/21 0844     02/10/21 0930  Ampicillin-Sulbactam (UNASYN) 3 g in sodium chloride 0.9 % 100 mL IVPB  Status:  Discontinued       Note  to Pharmacy: Renal dosing   3 g 200 mL/hr over 30 Minutes Intravenous Every 8 hours 02/10/21 0839 02/10/21 0844       Objective: Vitals:   02/10/21 0350 02/10/21 0757  BP: 127/64 118/66  Pulse: 71 69  Resp: 20   Temp: 97.9 F (36.6 C) (!) 97.2 F (36.2 C)  SpO2: 95% 95%    Intake/Output Summary (Last 24 hours) at 02/10/2021 1055 Last data filed at 02/10/2021 1024 Gross per 24 hour  Intake 240 ml  Output 625 ml  Net -385 ml   Filed Weights   02/08/21 0500 02/08/21 0636 02/10/21 0350  Weight: 65 kg 71.2 kg 71.7 kg   Weight change:  Body mass index is 28.91 kg/m.   Physical Exam: General exam: Pleasant, elderly Caucasian female.  Not in physical distress.  Looks dry. Skin: No rashes, lesions or ulcers. HEENT: Atraumatic, normocephalic, no obvious bleeding Lungs: Diminished air entry in both bases. On deep breathing, she has congested cough. CVS: Regular rate and rhythm, no murmur GI/Abd soft, nontender, nondistended, bowel sound present CNS: Alert, awake, oriented to place  only Psychiatry: Sad affect Extremities: Improving bilateral pedal edema  Data Review: I have personally reviewed the laboratory data and studies available.  F/u labs ordered Unresulted Labs (From admission, onward)     Start     Ordered   02/11/21 0500  Procalcitonin  Daily,   R     Question:  Specimen collection method  Answer:  Lab=Lab collect   02/10/21 0839   Unscheduled  CBC with Differential/Platelet  Daily,   R     Question:  Specimen collection method  Answer:  Lab=Lab collect   02/10/21 1055   Unscheduled  Basic metabolic panel  Daily,   R     Question:  Specimen collection method  Answer:  Lab=Lab collect   02/10/21 1055            Signed, Terrilee Croak, MD [facility] 02/10/2021

## 2021-02-10 NOTE — Progress Notes (Signed)
[facility]) Hospital Liaison note:  This is a pending outpatient-based Palliative Care patient. Will continue to follow for disposition.  Please call with any outpatient palliative questions or concerns.  Thank you, Lorelee Market, LPN [facility] Liaison [phone]

## 2021-02-11 DIAGNOSIS — Z515 Encounter for palliative care: Secondary | ICD-10-CM | POA: Diagnosis not present

## 2021-02-11 DIAGNOSIS — Z7189 Other specified counseling: Secondary | ICD-10-CM | POA: Diagnosis not present

## 2021-02-11 DIAGNOSIS — I5021 Acute systolic (congestive) heart failure: Secondary | ICD-10-CM | POA: Diagnosis not present

## 2021-02-11 LAB — BASIC METABOLIC PANEL
Anion gap: 9 (ref 5–15)
BUN: 38 mg/dL — ABNORMAL HIGH (ref 8–23)
CO2: 26 mmol/L (ref 22–32)
Calcium: 8.6 mg/dL — ABNORMAL LOW (ref 8.9–10.3)
Chloride: 101 mmol/L (ref 98–111)
Creatinine, Ser: 1.11 mg/dL — ABNORMAL HIGH (ref 0.44–1.00)
GFR, Estimated: 51 mL/min — ABNORMAL LOW (ref >60)
Glucose, Bld: 117 mg/dL — ABNORMAL HIGH (ref 70–99)
Potassium: 3.6 mmol/L (ref 3.5–5.1)
Sodium: 136 mmol/L (ref 135–145)

## 2021-02-11 LAB — CBC WITH DIFFERENTIAL/PLATELET
Abs Immature Granulocytes: 0.14 10*3/uL — ABNORMAL HIGH (ref 0.00–0.07)
Basophils Absolute: 0.1 10*3/uL (ref 0.0–0.1)
Basophils Relative: 1 %
Eosinophils Absolute: 0 10*3/uL (ref 0.0–0.5)
Eosinophils Relative: 0 %
HCT: 34.2 % — ABNORMAL LOW (ref 36.0–46.0)
Hemoglobin: 11.2 g/dL — ABNORMAL LOW (ref 12.0–15.0)
Immature Granulocytes: 1 %
Lymphocytes Relative: 11 %
Lymphs Abs: 1.5 10*3/uL (ref 0.7–4.0)
MCH: 29.6 pg (ref 26.0–34.0)
MCHC: 32.7 g/dL (ref 30.0–36.0)
MCV: 90.2 fL (ref 80.0–100.0)
Monocytes Absolute: 1.8 10*3/uL — ABNORMAL HIGH (ref 0.1–1.0)
Monocytes Relative: 12 %
Neutro Abs: 11 10*3/uL — ABNORMAL HIGH (ref 1.7–7.7)
Neutrophils Relative %: 75 %
Platelets: 274 10*3/uL (ref 150–400)
RBC: 3.79 MIL/uL — ABNORMAL LOW (ref 3.87–5.11)
RDW: 13.7 % (ref 11.5–15.5)
WBC: 14.5 10*3/uL — ABNORMAL HIGH (ref 4.0–10.5)
nRBC: 0 % (ref 0.0–0.2)

## 2021-02-11 LAB — PROCALCITONIN: Procalcitonin: 0.36 ng/mL

## 2021-02-11 MED ORDER — SODIUM CHLORIDE 0.9 % IV SOLN
3.0000 g | Freq: Three times a day (TID) | INTRAVENOUS | Status: DC
  Administered 2021-02-11 – 2021-02-13 (×5): 3 g via INTRAVENOUS
  Filled 2021-02-11: qty 3
  Filled 2021-02-11: qty 8
  Filled 2021-02-11: qty 3
  Filled 2021-02-11: qty 8
  Filled 2021-02-11: qty 3
  Filled 2021-02-11 (×3): qty 8

## 2021-02-11 MED ORDER — NEPRO/CARBSTEADY PO LIQD
237.0000 mL | Freq: Three times a day (TID) | ORAL | Status: DC
  Administered 2021-02-11 – 2021-02-14 (×9): 237 mL via ORAL

## 2021-02-11 NOTE — Progress Notes (Signed)
PHARMACY NOTE:  ANTIMICROBIAL RENAL DOSAGE ADJUSTMENT  Current antimicrobial regimen includes a mismatch between antimicrobial dosage and estimated renal function.  As per policy approved by the Pharmacy & Therapeutics and Medical Executive Committees, the antimicrobial dosage will be adjusted accordingly.  Current antimicrobial dosage:  Unasyn 3gm q 12 hours  Indication: Aspiration pneumonia  Renal Function:  Estimated Creatinine Clearance: 38.8 mL/min (A) (by C-G formula based on SCr of 1.11 mg/dL (H)). []      On intermittent HD, scheduled: []      On CRRT    Antimicrobial dosage has been changed to:  Unasyn 3gm q 8 hours  Additional comments:   Thank you for allowing pharmacy to be a part of this patient's care.  Raymie Trani Rodriguez-Guzman PharmD, BCPS 02/11/2021 9:27 AM

## 2021-02-11 NOTE — Progress Notes (Signed)
[facility] Encounter Note  Patient: Norma Hill / Admit Date: 02/07/2021 / Date of Encounter: 02/11/2021, 11:23 AM   Subjective: Patient is alert and sitting comfortably in bed at incline husband present at bedside.  Since stopping metoprolol and Cardizem patient's heart rate has been stable.  She has fortunately been in normal sinus rhythm since her cardioversion 12/7.  She has a productive cough but denies any chest pain, heart racing.  Review of Systems: Positive for: Cough Negative for: Vision change, hearing change, syncope, dizziness, nausea, vomiting,diarrhea, bloody stool, stomach pain, cough, congestion, diaphoresis, urinary frequency, urinary pain,skin lesions, skin rashes Others previously listed  Objective: Telemetry: Normal sinus rhythm with ventricular rate of 80 bpm.  Physical Exam: Blood pressure 131/69, pulse 84, temperature 97.7 F (36.5 C), temperature source Oral, resp. rate 19, height 5\' 2"  (1.575 m), weight 69.6 kg, SpO2 91 %. Body mass index is 28.06 kg/m. General: Elderly and frail appearing Caucasian female, in no acute distress upright in bed.  Husband present at bedside Head: Normocephalic, atraumatic, sclera non-icteric, no xanthomas,  Neck: No apparent masses Lungs: Poor air movement and wet cough with deep inspiration, no rhonchi, no rales.  On 4 L oxygen by nasal cannula.  Heart: Regular rate and rhythm, normal S1 S2, no murmur, no rub, no gallop,  Abdomen: non-distended appearing. No apparent hepatosplenomegaly.  Extremities: 1+ bilateral lower extremity edema unchanged from yesterday, no clubbing, no cyanosis, no ulcers Peripheral: 2+ radial, 2+ dorsal pedal pulses Neuro: Somnolent but easily arousable.  Moves all extremities spontaneously. Psych:  Responds to questions appropriately with a normal affect.     Intake/Output Summary (Last 24 hours) at 02/11/2021 1123 Last data filed at 02/11/2021 1008 Gross per 24 hour   Intake 2202.48 ml  Output 1450 ml  Net 752.48 ml    Inpatient Medications:   amiodarone  200 mg Oral BID   apixaban  5 mg Oral BID   Chlorhexidine Gluconate Cloth  6 each Topical Daily   feeding supplement  237 mL Oral TID BM   mouth rinse  15 mL Mouth Rinse BID   multivitamin with minerals  1 tablet Oral Daily   rosuvastatin  10 mg Oral Daily   Infusions:   ampicillin-sulbactam (UNASYN) IV      Labs: Recent Labs    02/10/21 0503 02/11/21 0615  NA 136 136  K 4.0 3.6  CL 102 101  CO2 25 26  GLUCOSE 126* 117*  BUN 37* 38*  CREATININE 1.95* 1.11*  CALCIUM 8.8* 8.6*    No results for input(s): AST, ALT, ALKPHOS, BILITOT, PROT, ALBUMIN in the last 72 hours. Recent Labs    02/10/21 0503 02/11/21 0615  WBC 15.4* 14.5*  NEUTROABS 12.1* 11.0*  HGB 11.5* 11.2*  HCT 35.4* 34.2*  MCV 89.8 90.2  PLT 236 274    No results for input(s): CKTOTAL, CKMB, TROPONINI in the last 72 hours. Invalid input(s): POCBNP No results for input(s): HGBA1C in the last 72 hours.   Weights: Filed Weights   02/08/21 0636 02/10/21 0350 02/11/21 0500  Weight: 71.2 kg 71.7 kg 69.6 kg     Radiology/Studies:  DG Chest 2 View  Result Date: 02/06/2021 CLINICAL DATA:  A 75 year old female presents for evaluation of cough and weakness. EXAM: CHEST - 2 VIEW COMPARISON:  December 24, 2018. FINDINGS: Cardiomediastinal contours remain enlarged. Hilar structures are stable. No lobar consolidative process. No visible pneumothorax. Small RIGHT-sided pleural effusion and likely small LEFT-sided pleural effusion. Mild  interstitial prominence without gross edema. On limited assessment no acute skeletal process. IMPRESSION: Small RIGHT greater than LEFT pleural effusions. Cardiomegaly with mild interstitial prominence, consider correlation with any signs of heart failure or volume overload. Electronically Signed   By: Zetta Bills M.D.   On: 02/06/2021 20:24   DG Chest Port 1 View  Result Date:  02/09/2021 CLINICAL DATA:  Dyspnea EXAM: PORTABLE CHEST 1 VIEW COMPARISON:  Chest x-ray 02/06/21, CT chest 09/11/2017 FINDINGS: The heart and mediastinal contours are unchanged. Aortic calcification. Interval development of patchy airspace opacity within the right perihilar region/right middle lobe. No focal consolidation. No pulmonary edema. No pleural effusion. No pneumothorax. No acute osseous abnormality. IMPRESSION: Interval development of patchy airspace opacity within the right perihilar region consistent with developing infection/inflammation. Followup PA and lateral chest X-ray is recommended in 3-4 weeks following therapy to ensure resolution. Electronically Signed   By: Iven Finn M.D.   On: 02/09/2021 16:49   ECHOCARDIOGRAM COMPLETE  Result Date: 02/07/2021    ECHOCARDIOGRAM REPORT   Patient Name:   Norma Hill Cranor Date of Exam: 02/07/2021 Medical Rec #:  [id number]          Height:       62.0 in Accession #:    [id number]         Weight:       145.0 lb Date of Birth:  Jan 24, 1946          BSA:          1.667 m Patient Age:    75 years           BP:           123/84 mmHg Patient Gender: F                  HR:           75 bpm. Exam Location:  [facility] Procedure: 2D Echo, Color Doppler and Cardiac Doppler Indications:     I50.21 congestive heart failure-Acute Systolic  History:         Patient has prior history of Echocardiogram examinations.                  Arrythmias:Atrial Fibrillation, Signs/Symptoms:Shortness of                  Breath and Edema; Risk Factors:Hypertension.  Sonographer:     Charmayne Sheer Referring Phys:  [id number] Norma Hill Diagnosing Phys: Norma Royals MD  Sonographer Comments: Suboptimal apical window and no subcostal window. IMPRESSIONS  1. Left ventricular ejection fraction, by estimation, is 60 to 65%. The left ventricle has normal function. The left ventricle has no regional wall motion abnormalities. Left ventricular diastolic parameters were normal.  2. Right ventricular  systolic function is normal. The right ventricular size is mildly enlarged.  3. Left atrial size was mildly dilated.  4. Right atrial size was mildly dilated.  5. The mitral valve is normal in structure. Trivial mitral valve regurgitation.  6. The aortic valve is normal in structure. Aortic valve regurgitation is not visualized. FINDINGS  Left Ventricle: Left ventricular ejection fraction, by estimation, is 60 to 65%. The left ventricle has normal function. The left ventricle has no regional wall motion abnormalities. The left ventricular internal cavity size was normal in size. There is  no left ventricular hypertrophy. Left ventricular diastolic parameters were normal. Right Ventricle: The right ventricular size is mildly enlarged. No increase in right ventricular wall thickness. Right ventricular  systolic function is normal. Left Atrium: Left atrial size was mildly dilated. Right Atrium: Right atrial size was mildly dilated. Pericardium: There is no evidence of pericardial effusion. Mitral Valve: The mitral valve is normal in structure. Trivial mitral valve regurgitation. Tricuspid Valve: The tricuspid valve is normal in structure. Tricuspid valve regurgitation is mild. Aortic Valve: The aortic valve is normal in structure. Aortic valve regurgitation is not visualized. Aortic valve mean gradient measures 3.0 mmHg. Aortic valve peak gradient measures 5.7 mmHg. Aortic valve area, by VTI measures 2.53 cm. Pulmonic Valve: The pulmonic valve was normal in structure. Pulmonic valve regurgitation is trivial. Aorta: The aortic root and ascending aorta are structurally normal, with no evidence of dilitation. IAS/Shunts: No atrial level shunt detected by color flow Doppler.  LEFT VENTRICLE PLAX 2D LVIDd:         4.73 cm   Diastology LVIDs:         3.83 cm   LV e' medial:    12.30 cm/s LV PW:         0.94 cm   LV E/e' medial:  6.1 LV IVS:        0.81 cm   LV e' lateral:   13.30 cm/s LVOT diam:     2.10 cm   LV E/e'  lateral: 5.7 LV SV:         54 LV SV Index:   33 LVOT Area:     3.46 cm  LEFT ATRIUM             Index LA diam:        4.70 cm 2.82 cm/m LA Vol (A2C):   52.0 ml 31.18 ml/m LA Vol (A4C):   95.2 ml 57.09 ml/m LA Biplane Vol: 75.7 ml 45.40 ml/m  AORTIC VALVE                    PULMONIC VALVE AV Area (Vmax):    2.20 cm     PV Vmax:       0.76 m/s AV Area (Vmean):   2.35 cm     PV Vmean:      48.200 cm/s AV Area (VTI):     2.53 cm     PV VTI:        0.107 m AV Vmax:           119.00 cm/s  PV Peak grad:  2.3 mmHg AV Vmean:          79.600 cm/s  PV Mean grad:  1.0 mmHg AV VTI:            0.215 m AV Peak Grad:      5.7 mmHg AV Mean Grad:      3.0 mmHg LVOT Vmax:         75.70 cm/s LVOT Vmean:        53.900 cm/s LVOT VTI:          0.157 m LVOT/AV VTI ratio: 0.73  AORTA Ao Root diam: 3.10 cm MITRAL VALVE               TRICUSPID VALVE MV Area (PHT): 5.57 cm    TR Peak grad:   26.0 mmHg MV Decel Time: 136 msec    TR Vmax:        255.00 cm/s MV E velocity: 75.42 cm/s MV A velocity: 39.40 cm/s  SHUNTS MV E/A ratio:  1.91        Systemic VTI:  0.16 m  Systemic Diam: 2.10 cm Norma Royals MD Electronically signed by Norma Royals MD Signature Date/Time: 02/07/2021/1:30:21 PM    Final      Assessment and Recommendation  75 y.o. female atrial fibrillation anticoagulated on Eliquis, HFpEF, hypertension, history of CVA in 2020 who presented to the ED on 12/5 with chief complaint of progressively worsening shortness of breath and generalized weakness.  Status post successful electrical cardioversion  02/10/2021 with Dr. Serafina Hill.   #Atrial fibrillation  -Continue amiodarone 200 mg p.o. twice daily for maintenance of sinus rhythm. -Continue anticoagulation with Eliquis for stroke risk reduction -Hold Cardizem and metoprolol at this time for bradycardia   #AKI Management per hospitalist service.  #Heart failure with preserved ejection fraction -Echocardiogram revealed estimated LVEF  60-65%.Its likely her atrial fibrillation is contributing to her heart failure symptoms. -Hold Lasix at this time, does not appear to be pulmonary edema.   #Hypertension -Continue lisinopril   #Dyslipidemia -Continue crestor    #Hypokalemia -Continue to replete and monitor BMP  Thank you for this consult. Dr. Isaias Cowman will be available for any further questions over the weekend.  Signed, Orinda Kenner, PA-C

## 2021-02-11 NOTE — Progress Notes (Signed)
Nutrition Follow-up  DOCUMENTATION CODES:   Not applicable  INTERVENTION:   -D/c Ensure Enlive po TID, each supplement provides 350 kcal and 20 grams of protein  -Nepro Shake po TID, each supplement provides 425 kcal and 19 grams protein  -Continue MVI with minerals daily -Magic cup TID with meals, each supplement provides 290 kcal and 9 grams of protein   NUTRITION DIAGNOSIS:   Inadequate oral intake related to decreased appetite as evidenced by per patient/family report.  Ongoing  GOAL:   Patient will meet greater than or equal to 90% of their needs  Progressing   MONITOR:   PO intake, Supplement acceptance, Diet advancement, Labs, Weight trends, Skin, I & O's  REASON FOR ASSESSMENT:   Consult Assessment of nutrition requirement/status  ASSESSMENT:   Norma Hill is a 75 y.o. female with medical history significant for atrial fibrillation, n hypertension and breast cancer, who presented to emergency room with acute onset of dyspnea with associated dry cough without wheezing with paroxysmal nocturnal dyspnea as well as dyspnea on exertion with associated generalized weakness.  She has been having worsening lower extremity edema.  No fever or chills.  No nausea or vomiting or abdominal pain.  She has been having diminished p.o. intake lately.  She denies any palpitations or chest pain.  No dysuria, oliguria or hematuria or flank pain.  12/7- s/p cardioversion 12/8- s/p BSE- dysphagia 3 diet with thin liquids  Reviewed I/O's: +7.5 ml x 24 hours and +1.3 L since admission  UOP: 2.1 L x 24 hours   Case discussed with SLP. Pt to be downgraded to a dysphagia 3 diet with nectar thick liquids. Per SLP, palliative care following and pt family indicated that pt may transition to comfort care if she continues to decline. Meal completions documented 0-100%.   Medications reviewed.   Labs reviewed.   Diet Order:   Diet Order             DIET DYS 3 Room service  appropriate? Yes with Assist; Fluid consistency: Nectar Thick  Diet effective now                   EDUCATION NEEDS:   Education needs have been addressed  Skin:  Skin Assessment: Reviewed RN Assessment  Last BM:  02/08/21  Height:   Ht Readings from Last 1 Encounters:  02/08/21 5\' 2"  (1.575 m)    Weight:   Wt Readings from Last 1 Encounters:  02/11/21 69.6 kg    Ideal Body Weight:  50 kg  BMI:  Body mass index is 28.06 kg/m.  Estimated Nutritional Needs:   Kcal:  1800-2000  Protein:  90-105 grams  Fluid:  > 1.8 L    Loistine Chance, RD, LDN, [place] Registered Dietitian II Certified Diabetes Care and Education Specialist Please refer to [facility] for RD and/or RD on-call/weekend/after hours pager

## 2021-02-11 NOTE — Care Management Important Message (Signed)
Important Message  Patient Details  Name: Norma Hill MRN: [id number] Date of Birth: Nov 16, 1945   Medicare Important Message Given:  Yes     Dannette Barbara 02/11/2021, 12:43 PM

## 2021-02-11 NOTE — Progress Notes (Signed)
Occupational Therapy Treatment Patient Details Name: Norma Hill MRN: [id number] DOB: 16-Apr-1945 Today's Date: 02/11/2021   History of present illness pt is a 75 y.o. female with a past medical history of atrial fibrillation anticoagulated on Eliquis, heart failure with preserved ejection fraction, hypertension, history of CVA in 2020 who presented to the ED on 12/5 with chief complaint of progressively worsening shortness of breath and generalized weakness. Underwent cardioversion.   OT comments  Pt seen for OT treatment this date to f/u re: safety with ADLs/ADL mobility. Pt sleeping in bed when OT presented. She has lights off. OT turns on lights to stimulate and assists pt in coming to EOB sitting with MOD A. She requires at least MIN A in EOB sitting to sustain static balance. She has poor attn to task d/t drowsiness/lethargy throughout session. She requires multimodal cueing to engage. OT engages pt in seated UB bathing/dressing tasks wtih increased time to process and MOD/MAX A as well as cues to sequence and attend throughout. Pt requires MAX A sit to sup. Pt left with all needs met and in reach, bed alarm set, RN notified of session. Will continue to follow acutely. Because pt was requiring increased assistance with bed mobility and ADLs than on initial OT evaluation, updated d/c recommendation to STR this date.    Recommendations for follow up therapy are one component of a multi-disciplinary discharge planning process, led by the attending physician.  Recommendations may be updated based on patient status, additional functional criteria and insurance authorization.    Follow Up Recommendations  Skilled nursing-short term rehab (<3 hours/day)    Assistance Recommended at Discharge Frequent or constant Supervision/Assistance  Equipment Recommendations  Other (comment) (defer)    Recommendations for Other Services      Precautions / Restrictions Precautions Precautions:  Fall Restrictions Weight Bearing Restrictions: No       Mobility Bed Mobility Overal bed mobility: Needs Assistance Bed Mobility: Supine to Sit;Sit to Supine     Supine to sit: Mod assist;HOB elevated Sit to supine: Max assist   General bed mobility comments: MAX A for trunk/LEs back to bed    Transfers                   General transfer comment: deferred d/t lethargy     Balance Overall balance assessment: Needs assistance Sitting-balance support: Feet unsupported;Bilateral upper extremity supported Sitting balance-Leahy Scale: Poor Sitting balance - Comments: requires at least MIN A for the majority of the session to sustain static sitting, very short bouts of supporting herself with MAX cues and holding bed rail                                   ADL either performed or assessed with clinical judgement   ADL Overall ADL's : Needs assistance/impaired     Grooming: Wash/dry hands;Wash/dry face;Minimal assistance;Sitting Grooming Details (indicate cue type and reason): cues to attend, initiate and terminate Upper Body Bathing: Moderate assistance;Maximal assistance;Sitting;Cueing for sequencing;Cueing for safety Upper Body Bathing Details (indicate cue type and reason): cues for safety with EOB sitting as pt is in and out of attending to task which impedes on her ability to contribute to sitting balance as well and MOD/MAX A for actual washing including arm pits.     Upper Body Dressing : Moderate assistance;Maximal assistance;Sitting  Extremity/Trunk Assessment              Vision       Perception     Praxis      Cognition Arousal/Alertness: Lethargic Behavior During Therapy: Flat affect;WFL for tasks assessed/performed Overall Cognitive Status: Within Functional Limits for tasks assessed                                 General Comments: pt oriented to self, palce (hospital),  disoriented x2, requires increased processing time, drowsy throughout, requiring multiple modes of stimuli to attend ~20% of the time.          Exercises Other Exercises Other Exercises: OT engages pt in seated UB bathing/dressing tasks wtih increasd time to process and MOD/MAX A as well as cues to sequence and attend throughout. Pt requires MOD A sup to sit and MAX A sit to sup.   Shoulder Instructions       General Comments      Pertinent Vitals/ Pain       Pain Assessment: No/denies pain  Home Living                                          Prior Functioning/Environment              Frequency  Min 2X/week        Progress Toward Goals  OT Goals(current goals can now be found in the care plan section)  Progress towards OT goals: Not progressing toward goals - comment;OT to reassess next treatment (pt more lethargic today and requriing increased assist in all aspects of self care and mobility)  Acute Rehab OT Goals Patient Stated Goal: none stated OT Goal Formulation: With patient Time For Goal Achievement: 02/23/21 Potential to Achieve Goals: Good  Plan Discharge plan needs to be updated    Co-evaluation                 AM-PAC OT "6 Clicks" Daily Activity     Outcome Measure   Help from another person eating meals?: A Little Help from another person taking care of personal grooming?: A Lot Help from another person toileting, which includes using toliet, bedpan, or urinal?: A Lot Help from another person bathing (including washing, rinsing, drying)?: A Lot Help from another person to put on and taking off regular upper body clothing?: A Lot Help from another person to put on and taking off regular lower body clothing?: A Lot 6 Click Score: 13    End of Session    OT Visit Diagnosis: Unsteadiness on feet (R26.81);Muscle weakness (generalized) (M62.81)   Activity Tolerance Patient limited by lethargy;Patient limited by fatigue    Patient Left in bed;with bed alarm set;with call bell/phone within reach   Nurse Communication Mobility status        Time: 1561-5379 OT Time Calculation (min): 19 min  Charges: OT General Charges $OT Visit: 1 Visit OT Treatments $Self Care/Home Management : 8-22 mins  Gerrianne Scale, [place], OTR/L ascom [phone] 02/11/21, 6:19 PM

## 2021-02-11 NOTE — Progress Notes (Signed)
Palliative:  Mrs. Cinco is lying quietly in bed.  She appears acutely/chronically ill and frail.  She does not try to interact with me in any way.   Sister in law, Horris Latino, is at bedside.  We talked about discharge plan.  Jimmie tells me that Mrs. Haltiwanger does not have desire to rehab.  We talk about the benefits of going to LTC with private pay.  Jimmie shares that they have been to [facility] and were happy with the facility.  Shares that Mrs. Suderman will have a private room.  They would go with hospice care.    It may be that Mrs. Calabria has a decline over the next few days.  Husband and sister in law would like transition to residential hospice when appropriate.   Conference with attending, bedside nursing staff and [facility] team related to patient condition, needs, and GOC.    Plan:  Treat the treatable, but no CPR or intubation.  Time for outcomes. Anticipate need for LTC, unsure about ability/desire to rehab. Hospice with [facility].   10 minutes  Quinn Axe, NP Palliative Medicine Team  Team Phone [phone] Greater than 50% of this time was spent counseling and coordinating care related to the above assessment and plan.

## 2021-02-11 NOTE — Progress Notes (Signed)
PROGRESS NOTE  Norma Hill  DOB: [birth year]  PCP: Guadalupe Maple, MD KGM:[id number]  DOA: 02/07/2021  LOS: 4 days  Hospital Day: 5  Chief Complaint  Patient presents with   Weakness    Brief narrative: Norma Hill is a 75 y.o. female with PMH significant for HTN, A. fib, breast cancer. Patient presented to the ED on 12/4 with complaint of progressively worsening shortness of breath, dry cough, wheezing, bilateral lower edema, PND and generalized weakness.  In the ED, patient was afebrile, heart rate 101, blood pressure 182/98, breathing on room air Later in the ED, she went to A. fib with RVR, confirmed by EKG with heart rate up to 150s Labs with potassium low at 3, WBC slightly elevated 12.8 EKG showed A. fib with RVR, ST segment depression lateral leads Chest x-ray showed small bilateral pleural effusion, cardiomegaly, mild interstitial prominence concerning for heart failure and volume overload. Patient was started on IV Lasix, IV Cardizem drip and admitted to hospitalist service.  Subjective: Patient was seen and examined this morning. Propped up in bed.  Shallow breathing. Alert, awake, unable to have one-word conversation Husband at bedside. On 3 L oxygen by nasal cannula. Looks weaker every day. Care follow-up appreciated. . Assessment/Plan: Acute respiratory failure with hypoxia -Not oxygen dependent at home.  Currently requiring 3 to 4 L oxygen. -Multifactorial: Primarily bilateral atelectasis, chronic silent aspiration,  -Encourage incentive spirometry, chest physiotherapy, ambulation. -Wean down as tolerated.  Suspect aspiration pneumonia -Patient has significant weakness and is not able to cough adequately.  Because of her dementia, patient probably has silent aspiration as well. -No fever but with elevated WBC count and procalcitonin, patient was started on IV Unasyn for suspected aspiration pneumonia.  -Speech therapy consultation obtained.   Currently on dysphagia 3 diet with thin liquid. -Continue to trend WBC count and procalcitonin. Recent Labs  Lab 02/07/21 0710 02/08/21 0512 02/09/21 0309 02/10/21 0503 02/11/21 0615  WBC 13.9* 12.3* 13.7* 15.4* 14.5*  PROCALCITON  --   --   --  0.57 0.36    AKI -Because of poor oral intake and hydration.  Given IV fluids for 24 hours.  I would stop IV fluid today to avoid fluid overload state.   Recent Labs    04/13/20 1421 04/27/20 1201 04/27/20 1235 02/06/21 1938 02/07/21 0710 02/08/21 0512 02/09/21 0309 02/10/21 0503 02/11/21 0615  BUN 7* CANCELED 12 11 12 17 22  37* 38*  CREATININE 0.74 CANCELED 0.88 0.59 0.66 0.68 0.86 1.95* 1.11*    Chronic combined systolic and diastolic CHF -Previous echo from 2020 with EF 40 to 45%.  On admission, patient's respiratory symptoms were suspected to be due to CHF exacerbation.  She was given IV Lasix.  However she started to become hypotensive and hence Lasix was held. -Repeat echocardiogram showed improvement in EF to 60 to 65%. -If she clinically improves, on long run, she may benefit from diuresis.  However at this time, with poor oral intake and hydration, she is at risk of AKI again  A. fib with RVR -Status post DC cardioversion on 12/7.   -Currently on amiodarone.  Metoprolol and Cardizem were held after bradycardia and hypotension. -On Eliquis for anticoagulation. -Patient was on aspirin and has now been stopped.  Hypokalemia -Potassium level improved with replacement. Recent Labs  Lab 02/06/21 2016 02/07/21 0710 02/08/21 0512 02/09/21 0309 02/10/21 0503 02/11/21 0615  K  --  2.9* 3.4* 3.6 4.0 3.6  MG 2.1  --   --   --   --   --  Dyslipidemia. -continue Crestor  Acute delirium-toxic metabolic encephalopathy Progressively worsening dementia -Patient's husband states that her mental status has been declining gradually for the last 2 years and was hard to take care of her at home prior to this admission.    -Almost on a daily basis, I have had a long discussion with husband at bedside.  He wants to take care of her at home but he understands that she may be beyond that point now. -Palliative care consultation was obtained.  Patient at this time is waiting for short-term rehab authorization by insurance.  -10 but the way she is clinically worsening, she may not be able to participate much with therapy.   Mobility: physically independent prior to presentation but dementia has been worsening for last 2 years. Living condition: Was living home with husband. Goals of care:   Code Status: DNR per chart Nutritional status: Body mass index is 28.06 kg/m.  Nutrition Problem: Inadequate oral intake Etiology: decreased appetite Signs/Symptoms: per patient/family report Diet:  Diet Order             DIET DYS 3 Room service appropriate? Yes with Assist; Fluid consistency: Nectar Thick  Diet effective now                  DVT prophylaxis:   apixaban (ELIQUIS) tablet 5 mg   Antimicrobials: None Fluid: None Consultants: Cardiology Family Communication: Husband at bedside  Status is: Inpatient  Continue in-hospital care because: Weak, oxygen dependent, progressively worsening may need long-term placement. Level of care: Telemetry Cardiac   Dispo: The patient is from: Home              Anticipated d/c is to: Unclear at this time, pending STR authorization              patient currently is not medically stable to d/c.   Difficult to place patient No     Infusions:   ampicillin-sulbactam (UNASYN) IV       Scheduled Meds:  amiodarone  200 mg Oral BID   apixaban  5 mg Oral BID   Chlorhexidine Gluconate Cloth  6 each Topical Daily   feeding supplement (NEPRO CARB STEADY)  237 mL Oral TID BM   mouth rinse  15 mL Mouth Rinse BID   multivitamin with minerals  1 tablet Oral Daily   rosuvastatin  10 mg Oral Daily    PRN meds: acetaminophen **OR** acetaminophen,  guaiFENesin-dextromethorphan, magnesium hydroxide, ondansetron **OR** [DISCONTINUED] ondansetron (ZOFRAN) IV, traZODone   Antimicrobials: Anti-infectives (From admission, onward)    Start     Dose/Rate Route Frequency Ordered Stop   02/11/21 1700  Ampicillin-Sulbactam (UNASYN) 3 g in sodium chloride 0.9 % 100 mL IVPB       Note to Pharmacy: Renal dosing   3 g 200 mL/hr over 30 Minutes Intravenous Every 8 hours 02/11/21 0924     02/10/21 1000  Ampicillin-Sulbactam (UNASYN) 3 g in sodium chloride 0.9 % 100 mL IVPB  Status:  Discontinued       Note to Pharmacy: Renal dosing   3 g 200 mL/hr over 30 Minutes Intravenous Every 12 hours 02/10/21 0844 02/11/21 0924   02/10/21 0930  Ampicillin-Sulbactam (UNASYN) 3 g in sodium chloride 0.9 % 100 mL IVPB  Status:  Discontinued       Note to Pharmacy: Renal dosing   3 g 200 mL/hr over 30 Minutes Intravenous Every 8 hours 02/10/21 0839 02/10/21 0844       Objective:  Vitals:   02/11/21 1150 02/11/21 1427  BP: 124/73 124/62  Pulse: 78 83  Resp:  20  Temp: 99.7 F (37.6 C) 98.3 F (36.8 C)  SpO2: 93% 95%    Intake/Output Summary (Last 24 hours) at 02/11/2021 1631 Last data filed at 02/11/2021 1628 Gross per 24 hour  Intake 1722.48 ml  Output 1950 ml  Net -227.52 ml    Filed Weights   02/08/21 0636 02/10/21 0350 02/11/21 0500  Weight: 71.2 kg 71.7 kg 69.6 kg   Weight change: -2.1 kg Body mass index is 28.06 kg/m.   Physical Exam: General exam: Pleasant, elderly Caucasian female.  Not in pain.  Significantly weak Skin: No rashes, lesions or ulcers. HEENT: Atraumatic, normocephalic, no obvious bleeding Lungs: Diminished air entry in both bases. On deep breathing, she has congested cough. CVS: Regular rate and rhythm, no murmur GI/Abd soft, nontender, nondistended, bowel sound present CNS: Alert, awake, oriented to place only Psychiatry: Sad affect Extremities: Improving bilateral pedal edema  Data Review: I have personally  reviewed the laboratory data and studies available.  F/u labs ordered Unresulted Labs (From admission, onward)     Start     Ordered   02/11/21 0500  Procalcitonin  Daily,   R     Question:  Specimen collection method  Answer:  Lab=Lab collect   02/10/21 0839   02/11/21 0500  CBC with Differential/Platelet  Daily,   R     Question:  Specimen collection method  Answer:  Lab=Lab collect   02/10/21 1055   02/11/21 1194  Basic metabolic panel  Daily,   R     Question:  Specimen collection method  Answer:  Lab=Lab collect   02/10/21 1055            Signed, Terrilee Croak, MD [facility] 02/11/2021

## 2021-02-12 ENCOUNTER — Encounter: Payer: Self-pay | Admitting: Family Medicine

## 2021-02-12 DIAGNOSIS — I5041 Acute combined systolic (congestive) and diastolic (congestive) heart failure: Secondary | ICD-10-CM | POA: Diagnosis not present

## 2021-02-12 LAB — CBC WITH DIFFERENTIAL/PLATELET
Abs Immature Granulocytes: 0.21 10*3/uL — ABNORMAL HIGH (ref 0.00–0.07)
Basophils Absolute: 0.1 10*3/uL (ref 0.0–0.1)
Basophils Relative: 0 %
Eosinophils Absolute: 0.1 10*3/uL (ref 0.0–0.5)
Eosinophils Relative: 1 %
HCT: 35 % — ABNORMAL LOW (ref 36.0–46.0)
Hemoglobin: 11.2 g/dL — ABNORMAL LOW (ref 12.0–15.0)
Immature Granulocytes: 2 %
Lymphocytes Relative: 14 %
Lymphs Abs: 1.8 10*3/uL (ref 0.7–4.0)
MCH: 28.9 pg (ref 26.0–34.0)
MCHC: 32 g/dL (ref 30.0–36.0)
MCV: 90.4 fL (ref 80.0–100.0)
Monocytes Absolute: 1.3 10*3/uL — ABNORMAL HIGH (ref 0.1–1.0)
Monocytes Relative: 10 %
Neutro Abs: 9.5 10*3/uL — ABNORMAL HIGH (ref 1.7–7.7)
Neutrophils Relative %: 73 %
Platelets: 297 10*3/uL (ref 150–400)
RBC: 3.87 MIL/uL (ref 3.87–5.11)
RDW: 13.7 % (ref 11.5–15.5)
WBC: 12.9 10*3/uL — ABNORMAL HIGH (ref 4.0–10.5)
nRBC: 0 % (ref 0.0–0.2)

## 2021-02-12 LAB — PROCALCITONIN: Procalcitonin: 0.28 ng/mL

## 2021-02-12 LAB — BASIC METABOLIC PANEL
Anion gap: 8 (ref 5–15)
BUN: 26 mg/dL — ABNORMAL HIGH (ref 8–23)
CO2: 27 mmol/L (ref 22–32)
Calcium: 8.6 mg/dL — ABNORMAL LOW (ref 8.9–10.3)
Chloride: 103 mmol/L (ref 98–111)
Creatinine, Ser: 0.73 mg/dL (ref 0.44–1.00)
GFR, Estimated: >60 mL/min (ref >60)
Glucose, Bld: 102 mg/dL — ABNORMAL HIGH (ref 70–99)
Potassium: 3.5 mmol/L (ref 3.5–5.1)
Sodium: 138 mmol/L (ref 135–145)

## 2021-02-12 NOTE — Progress Notes (Signed)
Progress Note    Norma Hill  ZOX:[id number] DOB: Jun 02, 1945  DOA: 02/07/2021 PCP: Guadalupe Maple, MD      Brief Narrative:    Medical records reviewed and are as summarized below:  Norma Hill is a 75 y.o. female with medical history significant for hypertension, atrial fibrillation, breast cancer.  She presented to the hospital with progressively worsening shortness of breath, dry cough, wheezing, PND, generalized weakness and bilateral lower extremity edema.      Assessment/Plan:   Principal Problem:   Acute CHF (congestive heart failure) (HCC)   Nutrition Problem: Inadequate oral intake Etiology: decreased appetite  Signs/Symptoms: per patient/family report   Body mass index is 28.15 kg/m.   Acute on chronic combined systolic and diastolic CHF: She had previously been treated with IV Lasix.  Lasix was held because of AKI and poor oral intake.  2D echo showed improvement in EF estimated at 60 to 65%.  Atrial fibrillation with RVR: S/p electrical cardioversion on 02/09/2021.  Continue amiodarone and Eliquis.  Metoprolol and Cardizem had been discontinued because of bradycardia  Suspected aspiration pneumonia: Continue IV Unasyn.  He is on dysphagia 3 diet  Acute hypoxemic respiratory failure: She is on 5 L/min oxygen via nasal cannula.  Taper down oxygen as able  AKI: Improved  Acute toxic metabolic encephalopathy/delirium with underlying worsening dementia: Continue supportive care.  Follow-up with palliative care team   Diet Order             DIET DYS 3 Room service appropriate? Yes with Assist; Fluid consistency: Nectar Thick  Diet effective now                      Consultants: Palliative care Cardiologist  Procedures: Electrical cardioversion for paroxysmal atrial fibrillation on 02/09/2021    Medications:    amiodarone  200 mg Oral BID   apixaban  5 mg Oral BID   Chlorhexidine Gluconate Cloth  6 each Topical Daily    feeding supplement (NEPRO CARB STEADY)  237 mL Oral TID BM   mouth rinse  15 mL Mouth Rinse BID   multivitamin with minerals  1 tablet Oral Daily   rosuvastatin  10 mg Oral Daily   Continuous Infusions:  ampicillin-sulbactam (UNASYN) IV 3 g (02/12/21 1036)     Anti-infectives (From admission, onward)    Start     Dose/Rate Route Frequency Ordered Stop   02/11/21 1700  Ampicillin-Sulbactam (UNASYN) 3 g in sodium chloride 0.9 % 100 mL IVPB       Note to Pharmacy: Renal dosing   3 g 200 mL/hr over 30 Minutes Intravenous Every 8 hours 02/11/21 0924     02/10/21 1000  Ampicillin-Sulbactam (UNASYN) 3 g in sodium chloride 0.9 % 100 mL IVPB  Status:  Discontinued       Note to Pharmacy: Renal dosing   3 g 200 mL/hr over 30 Minutes Intravenous Every 12 hours 02/10/21 0844 02/11/21 0924   02/10/21 0930  Ampicillin-Sulbactam (UNASYN) 3 g in sodium chloride 0.9 % 100 mL IVPB  Status:  Discontinued       Note to Pharmacy: Renal dosing   3 g 200 mL/hr over 30 Minutes Intravenous Every 8 hours 02/10/21 0839 02/10/21 0844              Family Communication/Anticipated D/C date and plan/Code Status   DVT prophylaxis:  apixaban (ELIQUIS) tablet 5 mg     Code Status: DNR  Family Communication: Discussed with husband at the bedside Disposition Plan: Plan to discharge to SNF when medically stable   Status is: Inpatient  Remains inpatient appropriate because: Awaiting placement to SNF           Subjective:   Interval events noted.  Patient said "I don't feel good".  Her husband was at the bedside and he said that patient has "stopped eating and stopped walking".  Objective:    Vitals:   02/12/21 0336 02/12/21 0600 02/12/21 0743 02/12/21 1206  BP: (!) 142/76  122/75 (!) 141/65  Pulse: 76  74 82  Resp: 20  16 17   Temp: 98.1 F (36.7 C)  98.3 F (36.8 C) 98.6 F (37 C)  TempSrc:   Oral Oral  SpO2: 93%  95% 94%  Weight:  69.8 kg    Height:       No data  found.   Intake/Output Summary (Last 24 hours) at 02/12/2021 1421 Last data filed at 02/12/2021 1410 Gross per 24 hour  Intake 337 ml  Output 925 ml  Net -588 ml   Filed Weights   02/10/21 0350 02/11/21 0500 02/12/21 0600  Weight: 71.7 kg 69.6 kg 69.8 kg    Exam:  GEN: NAD SKIN: Warm and dry EYES: EOMI ENT: MMM CV: RRR PULM: CTA B ABD: soft, ND, NT, +BS CNS: AAO x 3, non focal EXT: No edema or tenderness       Data Reviewed:   I have personally reviewed following labs and imaging studies:  Labs: Labs show the following:   Basic Metabolic Panel: Recent Labs  Lab 02/06/21 2016 02/07/21 0710 02/08/21 0512 02/09/21 0309 02/10/21 0503 02/11/21 0615 02/12/21 0638  NA  --    < > 138 135 136 136 138  K  --    < > 3.4* 3.6 4.0 3.6 3.5  CL  --    < > 102 103 102 101 103  CO2  --    < > 25 26 25 26 27   GLUCOSE  --    < > 122* 120* 126* 117* 102*  BUN  --    < > 17 22 37* 38* 26*  CREATININE  --    < > 0.68 0.86 1.95* 1.11* 0.73  CALCIUM  --    < > 9.1 8.8* 8.8* 8.6* 8.6*  MG 2.1  --   --   --   --   --   --    < > = values in this interval not displayed.   GFR Estimated Creatinine Clearance: 53.9 mL/min (by C-G formula based on SCr of 0.73 mg/dL). Liver Function Tests: Recent Labs  Lab 02/06/21 2016  AST 18  ALT 14  ALKPHOS 101  BILITOT 1.6*  PROT 7.5  ALBUMIN 4.2   Recent Labs  Lab 02/06/21 2016  LIPASE 27   No results for input(s): AMMONIA in the last 168 hours. Coagulation profile Recent Labs  Lab 02/08/21 0026  INR 1.2    CBC: Recent Labs  Lab 02/08/21 0512 02/09/21 0309 02/10/21 0503 02/11/21 0615 02/12/21 0638  WBC 12.3* 13.7* 15.4* 14.5* 12.9*  NEUTROABS 9.8* 10.6* 12.1* 11.0* 9.5*  HGB 12.6 12.3 11.5* 11.2* 11.2*  HCT 39.7 37.8 35.4* 34.2* 35.0*  MCV 92.3 89.8 89.8 90.2 90.4  PLT 207 236 236 274 297   Cardiac Enzymes: No results for input(s): CKTOTAL, CKMB, CKMBINDEX, TROPONINI in the last 168 hours. BNP (last 3  results) No results for input(s): PROBNP  in the last 8760 hours. CBG: No results for input(s): GLUCAP in the last 168 hours. D-Dimer: No results for input(s): DDIMER in the last 72 hours. Hgb A1c: No results for input(s): HGBA1C in the last 72 hours. Lipid Profile: No results for input(s): CHOL, HDL, LDLCALC, TRIG, CHOLHDL, LDLDIRECT in the last 72 hours. Thyroid function studies: No results for input(s): TSH, T4TOTAL, T3FREE, THYROIDAB in the last 72 hours.  Invalid input(s): FREET3 Anemia work up: No results for input(s): VITAMINB12, FOLATE, FERRITIN, TIBC, IRON, RETICCTPCT in the last 72 hours. Sepsis Labs: Recent Labs  Lab 02/09/21 0309 02/10/21 0503 02/11/21 0615 02/12/21 1950  PROCALCITON  --  0.57 0.36 0.28  WBC 13.7* 15.4* 14.5* 12.9*    Microbiology Recent Results (from the past 240 hour(s))  Resp Panel by RT-PCR (Flu A&B, Covid) Nasopharyngeal Swab     Status: None   Collection Time: 02/06/21  7:38 PM   Specimen: Nasopharyngeal Swab; Nasopharyngeal(NP) swabs in vial transport medium  Result Value Ref Range Status   SARS Coronavirus 2 by RT PCR NEGATIVE NEGATIVE Final    Comment: (NOTE) SARS-CoV-2 target nucleic acids are NOT DETECTED.  The SARS-CoV-2 RNA is generally detectable in upper respiratory specimens during the acute phase of infection. The lowest concentration of SARS-CoV-2 viral copies this assay can detect is 138 copies/mL. A negative result does not preclude SARS-Cov-2 infection and should not be used as the sole basis for treatment or other patient management decisions. A negative result may occur with  improper specimen collection/handling, submission of specimen other than nasopharyngeal swab, presence of viral mutation(s) within the areas targeted by this assay, and inadequate number of viral copies(<138 copies/mL). A negative result must be combined with clinical observations, patient history, and epidemiological information. The expected  result is Negative.  Fact Sheet for Patients:  EntrepreneurPulse.com.au  Fact Sheet for Healthcare Providers:  IncredibleEmployment.be  This test is no t yet approved or cleared by the [place] FDA and  has been authorized for detection and/or diagnosis of SARS-CoV-2 by FDA under an Emergency Use Authorization (EUA). This EUA will remain  in effect (meaning this test can be used) for the duration of the COVID-19 declaration under Section 564(b)(1) of the Act, 21 U.S.C.section 360bbb-3(b)(1), unless the authorization is terminated  or revoked sooner.       Influenza A by PCR NEGATIVE NEGATIVE Final   Influenza B by PCR NEGATIVE NEGATIVE Final    Comment: (NOTE) The Xpert Xpress SARS-CoV-2/FLU/RSV plus assay is intended as an aid in the diagnosis of influenza from Nasopharyngeal swab specimens and should not be used as a sole basis for treatment. Nasal washings and aspirates are unacceptable for Xpert Xpress SARS-CoV-2/FLU/RSV testing.  Fact Sheet for Patients: EntrepreneurPulse.com.au  Fact Sheet for Healthcare Providers: IncredibleEmployment.be  This test is not yet approved or cleared by the [place] FDA and has been authorized for detection and/or diagnosis of SARS-CoV-2 by FDA under an Emergency Use Authorization (EUA). This EUA will remain in effect (meaning this test can be used) for the duration of the COVID-19 declaration under Section 564(b)(1) of the Act, 21 U.S.C. section 360bbb-3(b)(1), unless the authorization is terminated or revoked.  Performed at [facility], [address] [place], [place] [address]     Procedures and diagnostic studies:  No results found.             LOS: 5 days   Delmar Arriaga  [facility]   Pager on [email or web address]. If 7PM-7AM, please contact  night-coverage at [email or web address]     02/12/2021, 2:21 PM

## 2021-02-12 NOTE — Progress Notes (Addendum)
Speech Language Pathology Treatment: Dysphagia  Patient Details Name: Norma Hill MRN: [id number] DOB: 05-11-1945 Today's Date: 02/12/2021 Time: 5277-8242 SLP Time Calculation (min) (ACUTE ONLY): 35 min  Assessment / Plan / Recommendation Clinical Impression  Pt appears to present w/ oropharyngeal phase dysphagia suspect in light of Baseline Dementia; declined Cognitive status. Any Cognitive decline can impact timing of the pharyngeal swallow as well as impact awareness during po tasks -- these issues increase risk for aspiration as well as reduced oral intake. Pt required min-mod tactile/verbal/visual cues during po tasks and self-feeding support. She was able to hold her own Cup when drinking. Pt is known to have poor po intake per MD/chart notes; there is concern about aspiration pneumonia per MD note.  Pt consumed trials of thin liquids initially w/ overt, clinical s/s of aspiration noted (coughing) w/ both straw and via Cup, though less coughing noted w/ Cup use. Due to pt's Sister in law indicating concern w/ the coughing and "probable aspiration", trials of Nectar liquids via Cup given (along w/purees) w/ no further immediate, overt coughing noted. Vocal quality was clear b/t trials, and no decline in respiratory presentation noted during/post trials. Suspect decreased attention to task as well as potential delayed pharyngeal swallowin initiation(d/t decreased awareness) could have impacted safety and success of pharyngeal swallow w/ thin liquids. Oral phase was c/b min slow, deliberate bolus management and oral clearing of all boluses given. She required increased Time w/ all oral intake; reduced distractions given as support as well.   Sister in law present assisting w/ feeding of pt. She indicated that the family is monitoring pt's status re: discharge plans/options: to a [facility] vs Hospice.  She indicated concern of further decline of status. The Sister in law agreed w/  Downgrading the liquids in the diet to Nectar consistency since the POC could include D/C to a skilled facility (currently) as well as for comfort of less coughing when drinking liquids. Encouraged her/family to consider liberalizing the diet if the POC shifts toward Hospice/Comfort Care discharge if they so wish; she agreed and will f/u w/ MD to adjust the diet order if family so chooses.   Recommend dysphagia level 3 diet(minced meats) w/ Nectar consistency liquids; aspiration precautions; Pills Crushed in puree for safety; feeding support and supervision at meals, reduce Distractions during meals and check ofr oral clearing during/post intake. NSG/MD updated. ST services will sign off at this time. (See above)      HPI HPI: Per 71 H&P "Norma Hill is a 75 y.o. female with medical history significant for atrial fibrillation, n hypertension and breast cancer, who presented to emergency room with acute onset of dyspnea with associated dry cough without wheezing with paroxysmal nocturnal dyspnea as well as dyspnea on exertion with associated generalized weakness.  She has been having worsening lower extremity edema.  No fever or chills.  No nausea or vomiting or abdominal pain.  She has been having diminished p.o. intake lately.  She denies any palpitations or chest pain.  No dysuria, oliguria or hematuria or flank pain.        ED Course: Upon position to the emergency room blood pressure was 182/98 with a heart rate of 101 with otherwise normal vital signs.  Labs revealed hypokalemia 3 otherwise unremarkable BMP.  LFTs with 1.6 total bili with 1.2 indirect bili 0.4 direct bili.  I was antigens and COVID-19 PCR came back negative.  TSH was 0.67.     EKG showed atrial fibrillation  with rapid ventricular sponsor of 150 with PVCs.  There were lateral ST segment depression.     Imaging: Chest x-ray showed small right greater than left pleural effusion, cardiomegaly with mild interstitial prominence  concerning for heart failure and volume overload.    The patient was given 20 g of IV Lasix and 15 mg of IV Cardizem bolus followed by 120 p.o. Cardizem.  She will be admitted to a cardiac telemetry bed for further evaluation and management."      SLP Plan  Continue with current plan of care;Consult other service (comment) (Palliative Care following)      Recommendations for follow up therapy are one component of a multi-disciplinary discharge planning process, led by the attending physician.  Recommendations may be updated based on patient status, additional functional criteria and insurance authorization.    Recommendations  Diet recommendations: Dysphagia 3 (mechanical soft);Nectar-thick liquid Liquids provided via: Cup;No straw Medication Administration: Whole meds with puree (vs Crushed in puree) Supervision: Patient able to self feed;Staff to assist with self feeding;Full supervision/cueing for compensatory strategies Compensations: Minimize environmental distractions;Slow rate;Small sips/bites;Lingual sweep for clearance of pocketing;Follow solids with liquid Postural Changes and/or Swallow Maneuvers: Out of bed for meals;Seated upright 90 degrees;Upright 30-60 min after meal                General recommendations:  (Palliative care following) Oral Care Recommendations: Oral care BID;Oral care before and after PO;Staff/trained caregiver to provide oral care Follow Up Recommendations: Skilled nursing-short term rehab (<3 hours/day) (but TBD per POC) Assistance recommended at discharge: Frequent or constant Supervision/Assistance SLP Visit Diagnosis: Dysphagia, oropharyngeal phase (R13.12) (baseline Cognitive decilne) Plan: Continue with current plan of care;Consult other service (comment) (Palliative Care following)       GO                  Orinda Kenner, MS, CCC-SLP Speech Language Pathologist Rehab Services [phone] [facility]  02/12/2021, 2:51  PM

## 2021-02-12 NOTE — Progress Notes (Signed)
[facility] Cardiology  SUBJECTIVE: Patient laying in bed, denies chest pain   Vitals:   02/12/21 0013 02/12/21 0336 02/12/21 0600 02/12/21 0743  BP: (!) 137/57 (!) 142/76  122/75  Pulse: 77 76  74  Resp: 19 20  16   Temp: 98.2 F (36.8 C) 98.1 F (36.7 C)  98.3 F (36.8 C)  TempSrc: Oral   Oral  SpO2: 93% 93%  95%  Weight:   69.8 kg   Height:         Intake/Output Summary (Last 24 hours) at 02/12/2021 0854 Last data filed at 02/12/2021 9242 Gross per 24 hour  Intake 697 ml  Output 925 ml  Net -228 ml      PHYSICAL EXAM  General: Well developed, well nourished, in no acute distress HEENT:  Normocephalic and atramatic Neck:  No JVD.  Lungs: Clear bilaterally to auscultation and percussion. Heart: HRRR . Normal S1 and S2 without gallops or murmurs.  Abdomen: Bowel sounds are positive, abdomen soft and non-tender  Msk:  Back normal, normal gait. Normal strength and tone for age. Extremities: No clubbing, cyanosis or edema.   Neuro: Alert and oriented X 3. Psych:  Good affect, responds appropriately   LABS: Basic Metabolic Panel: Recent Labs    02/11/21 0615 02/12/21 0638  NA 136 138  K 3.6 3.5  CL 101 103  CO2 26 27  GLUCOSE 117* 102*  BUN 38* 26*  CREATININE 1.11* 0.73  CALCIUM 8.6* 8.6*   Liver Function Tests: No results for input(s): AST, ALT, ALKPHOS, BILITOT, PROT, ALBUMIN in the last 72 hours. No results for input(s): LIPASE, AMYLASE in the last 72 hours. CBC: Recent Labs    02/11/21 0615 02/12/21 0638  WBC 14.5* 12.9*  NEUTROABS 11.0* 9.5*  HGB 11.2* 11.2*  HCT 34.2* 35.0*  MCV 90.2 90.4  PLT 274 297   Cardiac Enzymes: No results for input(s): CKTOTAL, CKMB, CKMBINDEX, TROPONINI in the last 72 hours. BNP: Invalid input(s): POCBNP D-Dimer: No results for input(s): DDIMER in the last 72 hours. Hemoglobin A1C: No results for input(s): HGBA1C in the last 72 hours. Fasting Lipid Panel: No results for input(s): CHOL, HDL, LDLCALC, TRIG, CHOLHDL,  LDLDIRECT in the last 72 hours. Thyroid Function Tests: No results for input(s): TSH, T4TOTAL, T3FREE, THYROIDAB in the last 72 hours.  Invalid input(s): FREET3 Anemia Panel: No results for input(s): VITAMINB12, FOLATE, FERRITIN, TIBC, IRON, RETICCTPCT in the last 72 hours.  No results found.   Echo LVEF 60 to 65% 02/07/2021  TELEMETRY: Sinus rhythm 67 bpm:  ASSESSMENT AND PLAN:  Principal Problem:   Acute CHF (congestive heart failure) ([place])    1.  Paroxysmal atrial fibrillation, status post successful electrical cardioversion 02/10/2021, on Eliquis for stroke prevention, and amiodarone for rate and rhythm control 2.  Acute diastolic congestive heart failure, LVEF 60 to 65%, appears euvolemic 3.  Aspiration pneumonia, on broad-spectrum antibiotics 4.  Essential hypertension, blood pressure in normal range off BP medications at this time 5.  Acute kidney injury, improved, BUN and creatinine 26 and 0.73, respectively  Recommendations  1.  Agree with current therapy 2.  Continue Eliquis for stroke prevention 3.  Continue amiodarone 200 mg twice daily for rate and rhythm control 4.  Hold furosemide for now 5.  No further cardiac diagnostics at this time 6.  Follow-up Dr. Nehemiah Massed 1 to 2 weeks after discharge   Isaias Cowman, MD, PhD, [facility] 02/12/2021 8:54 AM

## 2021-02-13 DIAGNOSIS — I5041 Acute combined systolic (congestive) and diastolic (congestive) heart failure: Secondary | ICD-10-CM | POA: Diagnosis not present

## 2021-02-13 DIAGNOSIS — I5031 Acute diastolic (congestive) heart failure: Secondary | ICD-10-CM

## 2021-02-13 DIAGNOSIS — I4891 Unspecified atrial fibrillation: Secondary | ICD-10-CM | POA: Diagnosis not present

## 2021-02-13 DIAGNOSIS — Z8673 Personal history of transient ischemic attack (TIA), and cerebral infarction without residual deficits: Secondary | ICD-10-CM

## 2021-02-13 LAB — BASIC METABOLIC PANEL
Anion gap: 9 (ref 5–15)
BUN: 18 mg/dL (ref 8–23)
CO2: 28 mmol/L (ref 22–32)
Calcium: 8.5 mg/dL — ABNORMAL LOW (ref 8.9–10.3)
Chloride: 103 mmol/L (ref 98–111)
Creatinine, Ser: 0.62 mg/dL (ref 0.44–1.00)
GFR, Estimated: >60 mL/min (ref >60)
Glucose, Bld: 102 mg/dL — ABNORMAL HIGH (ref 70–99)
Potassium: 3.3 mmol/L — ABNORMAL LOW (ref 3.5–5.1)
Sodium: 140 mmol/L (ref 135–145)

## 2021-02-13 LAB — CBC WITH DIFFERENTIAL/PLATELET
Abs Immature Granulocytes: 0.24 10*3/uL — ABNORMAL HIGH (ref 0.00–0.07)
Basophils Absolute: 0.1 10*3/uL (ref 0.0–0.1)
Basophils Relative: 1 %
Eosinophils Absolute: 0.2 10*3/uL (ref 0.0–0.5)
Eosinophils Relative: 2 %
HCT: 34.8 % — ABNORMAL LOW (ref 36.0–46.0)
Hemoglobin: 11.3 g/dL — ABNORMAL LOW (ref 12.0–15.0)
Immature Granulocytes: 2 %
Lymphocytes Relative: 14 %
Lymphs Abs: 1.7 10*3/uL (ref 0.7–4.0)
MCH: 29 pg (ref 26.0–34.0)
MCHC: 32.5 g/dL (ref 30.0–36.0)
MCV: 89.2 fL (ref 80.0–100.0)
Monocytes Absolute: 1.1 10*3/uL — ABNORMAL HIGH (ref 0.1–1.0)
Monocytes Relative: 9 %
Neutro Abs: 8.9 10*3/uL — ABNORMAL HIGH (ref 1.7–7.7)
Neutrophils Relative %: 72 %
Platelets: 333 10*3/uL (ref 150–400)
RBC: 3.9 MIL/uL (ref 3.87–5.11)
RDW: 13.5 % (ref 11.5–15.5)
WBC: 12.2 10*3/uL — ABNORMAL HIGH (ref 4.0–10.5)
nRBC: 0 % (ref 0.0–0.2)

## 2021-02-13 LAB — MAGNESIUM: Magnesium: 2 mg/dL (ref 1.7–2.4)

## 2021-02-13 LAB — PHOSPHORUS: Phosphorus: 2.6 mg/dL (ref 2.5–4.6)

## 2021-02-13 MED ORDER — AMOXICILLIN-POT CLAVULANATE 875-125 MG PO TABS
1.0000 | ORAL_TABLET | Freq: Two times a day (BID) | ORAL | Status: DC
  Administered 2021-02-13 – 2021-02-14 (×3): 1 via ORAL
  Filled 2021-02-13 (×3): qty 1

## 2021-02-13 MED ORDER — POTASSIUM CHLORIDE 20 MEQ PO PACK
40.0000 meq | PACK | Freq: Two times a day (BID) | ORAL | Status: AC
  Administered 2021-02-13 (×2): 40 meq via ORAL
  Filled 2021-02-13 (×2): qty 2

## 2021-02-13 NOTE — Progress Notes (Signed)
[facility] Cardiology  SUBJECTIVE: Patient laying in bed, denies chest pain   Vitals:   02/12/21 2326 02/13/21 0410 02/13/21 0414 02/13/21 0733  BP: (!) 143/69 (!) 149/61  (!) 143/62  Pulse: 82 84  77  Resp: 19 20  20   Temp: 97.8 F (36.6 C) 98.2 F (36.8 C)  (!) 97.3 F (36.3 C)  TempSrc: Oral Oral    SpO2: 95% 94%  91%  Weight:   71.2 kg   Height:         Intake/Output Summary (Last 24 hours) at 02/13/2021 0949 Last data filed at 02/13/2021 0029 Gross per 24 hour  Intake 0 ml  Output 900 ml  Net -900 ml      PHYSICAL EXAM  General: Well developed, well nourished, in no acute distress HEENT:  Normocephalic and atramatic Neck:  No JVD.  Lungs: Clear bilaterally to auscultation and percussion. Heart: HRRR . Normal S1 and S2 without gallops or murmurs.  Abdomen: Bowel sounds are positive, abdomen soft and non-tender  Msk:  Back normal, normal gait. Normal strength and tone for age. Extremities: No clubbing, cyanosis or edema.   Neuro: Alert and oriented X 3. Psych:  Good affect, responds appropriately   LABS: Basic Metabolic Panel: Recent Labs    02/12/21 0638 02/13/21 0524  NA 138 140  K 3.5 3.3*  CL 103 103  CO2 27 28  GLUCOSE 102* 102*  BUN 26* 18  CREATININE 0.73 0.62  CALCIUM 8.6* 8.5*  MG  --  2.0  PHOS  --  2.6   Liver Function Tests: No results for input(s): AST, ALT, ALKPHOS, BILITOT, PROT, ALBUMIN in the last 72 hours. No results for input(s): LIPASE, AMYLASE in the last 72 hours. CBC: Recent Labs    02/12/21 0638 02/13/21 0524  WBC 12.9* 12.2*  NEUTROABS 9.5* 8.9*  HGB 11.2* 11.3*  HCT 35.0* 34.8*  MCV 90.4 89.2  PLT 297 333   Cardiac Enzymes: No results for input(s): CKTOTAL, CKMB, CKMBINDEX, TROPONINI in the last 72 hours. BNP: Invalid input(s): POCBNP D-Dimer: No results for input(s): DDIMER in the last 72 hours. Hemoglobin A1C: No results for input(s): HGBA1C in the last 72 hours. Fasting Lipid Panel: No results for input(s):  CHOL, HDL, LDLCALC, TRIG, CHOLHDL, LDLDIRECT in the last 72 hours. Thyroid Function Tests: No results for input(s): TSH, T4TOTAL, T3FREE, THYROIDAB in the last 72 hours.  Invalid input(s): FREET3 Anemia Panel: No results for input(s): VITAMINB12, FOLATE, FERRITIN, TIBC, IRON, RETICCTPCT in the last 72 hours.  No results found.   Echo LVEF 60 to 65%, 02/07/2021  TELEMETRY: Sinus rhythm:  ASSESSMENT AND PLAN:  Principal Problem:   Acute CHF (congestive heart failure) ([place])    1.  Paroxysmal atrial fibrillation, status post successful electrical cardioversion 02/10/2021, on Eliquis for stroke prevention, and amiodarone for rate and rhythm control 2.  Acute diastolic congestive heart failure, LVEF 60 to 65%, appears euvolemic 3.  Aspiration pneumonia, on broad-spectrum antibiotics 4.  Essential hypertension, blood pressure in normal range off BP medications at this time 5.  Acute kidney injury, normalized, BUN and creatinine 18 and 0.62, respectively   Recommendations   1.  Agree with current therapy 2.  Continue Eliquis for stroke prevention 3.  Continue amiodarone 200 mg twice daily for rate and rhythm control 4.  Hold furosemide for now 5.  No further cardiac diagnostics at this time 6.  Follow-up Dr. Nehemiah Massed 1 to 2 weeks after discharge   Isaias Cowman, MD, PhD,  [facility] 02/13/2021 9:49 AM

## 2021-02-13 NOTE — Progress Notes (Addendum)
Progress Note    [name]  JJK:[id number] DOB: [birth year]  DOA: 02/07/2021 PCP: Guadalupe Maple, MD      Brief Narrative:    Medical records reviewed and are as summarized below:  Norma Hill is a 75 y.o. female with medical history significant for hypertension, atrial fibrillation, breast cancer, chronic systolic CHF, history of stroke, dementia.  She presented to the hospital with progressively worsening shortness of breath, dry cough, wheezing, PND, generalized weakness and bilateral lower extremity edema.      Assessment/Plan:   Principal Problem:   Acute CHF (congestive heart failure) (HCC) Active Problems:   Atrial fibrillation with RVR (HCC)   History of stroke   Nutrition Problem: Inadequate oral intake Etiology: decreased appetite  Signs/Symptoms: per patient/family report   Body mass index is 28.7 kg/m.   Acute diastolic CHF, history of chronic systolic CHF: She had previously been treated with IV Lasix.  Lasix was held because of AKI and poor oral intake.  2D echo showed improvement in EF estimated at 60 to 65%.  Atrial fibrillation with RVR: S/p electrical cardioversion on 02/09/2021.  Continue amiodarone and Eliquis.  Metoprolol and Cardizem had been discontinued because of bradycardia  Suspected aspiration pneumonia: Change IV Unasyn to  Augmentin.  She is on dysphagia 3 diet  Hypokalemia: Replete potassium and monitor levels  Acute hypoxemic respiratory failure: Oxygen therapy is down from 5 L/min to 3 L/min via nasal cannula.  Wean off oxygen as able.    AKI: Improved  Acute toxic metabolic encephalopathy/delirium with underlying worsening dementia, history of stroke: Continue supportive care.  Follow-up with palliative care team   Diet Order             DIET DYS 3 Room service appropriate? Yes with Assist; Fluid consistency: Nectar Thick  Diet effective now                      Consultants: Palliative  care Cardiologist  Procedures: Electrical cardioversion for paroxysmal atrial fibrillation on 02/09/2021    Medications:    amiodarone  200 mg Oral BID   amoxicillin-clavulanate  1 tablet Oral Q12H   apixaban  5 mg Oral BID   Chlorhexidine Gluconate Cloth  6 each Topical Daily   feeding supplement (NEPRO CARB STEADY)  237 mL Oral TID BM   mouth rinse  15 mL Mouth Rinse BID   multivitamin with minerals  1 tablet Oral Daily   potassium chloride  40 mEq Oral BID   rosuvastatin  10 mg Oral Daily   Continuous Infusions:     Anti-infectives (From admission, onward)    Start     Dose/Rate Route Frequency Ordered Stop   02/13/21 1000  amoxicillin-clavulanate (AUGMENTIN) 875-125 MG per tablet 1 tablet        1 tablet Oral Every 12 hours 02/13/21 0801 02/18/21 0959   02/11/21 1700  Ampicillin-Sulbactam (UNASYN) 3 g in sodium chloride 0.9 % 100 mL IVPB  Status:  Discontinued       Note to Pharmacy: Renal dosing   3 g 200 mL/hr over 30 Minutes Intravenous Every 8 hours 02/11/21 0924 02/13/21 0801   02/10/21 1000  Ampicillin-Sulbactam (UNASYN) 3 g in sodium chloride 0.9 % 100 mL IVPB  Status:  Discontinued       Note to Pharmacy: Renal dosing   3 g 200 mL/hr over 30 Minutes Intravenous Every 12 hours 02/10/21 0844 02/11/21 0924   02/10/21 0930  Ampicillin-Sulbactam (UNASYN) 3 g in sodium chloride 0.9 % 100 mL IVPB  Status:  Discontinued       Note to Pharmacy: Renal dosing   3 g 200 mL/hr over 30 Minutes Intravenous Every 8 hours 02/10/21 0839 02/10/21 0844              Family Communication/Anticipated D/C date and plan/Code Status   DVT prophylaxis:  apixaban (ELIQUIS) tablet 5 mg     Code Status: DNR  Family Communication: Discussed with husband at the bedside Disposition Plan: Plan to discharge to SNF   Status is: Inpatient  Remains inpatient appropriate because: Awaiting placement to SNF           Subjective:   She said she feels okay.  Her husband  was at the bedside  and he said that patient is still not eating   Objective:    Vitals:   02/13/21 0414 02/13/21 0733 02/13/21 1109 02/13/21 1115  BP:  (!) 143/62 (!) 141/64 131/79  Pulse:  77 80 84  Resp:  20 17 17   Temp:  (!) 97.3 F (36.3 C) 98.3 F (36.8 C) 98.3 F (36.8 C)  TempSrc:      SpO2:  91% 93% 97%  Weight: 71.2 kg     Height:       No data found.   Intake/Output Summary (Last 24 hours) at 02/13/2021 1234 Last data filed at 02/13/2021 1024 Gross per 24 hour  Intake 0 ml  Output 900 ml  Net -900 ml   Filed Weights   02/11/21 0500 02/12/21 0600 02/13/21 0414  Weight: 69.6 kg 69.8 kg 71.2 kg    Exam:  GEN: NAD SKIN: Warm and dry EYES: EOMI ENT: MMM CV: RRR PULM: CTA B ABD: soft, ND, NT, +BS CNS: AAO x 3, non focal EXT: No edema or tenderness        Data Reviewed:   I have personally reviewed following labs and imaging studies:  Labs: Labs show the following:   Basic Metabolic Panel: Recent Labs  Lab 02/06/21 2016 02/07/21 0710 02/09/21 0309 02/10/21 0503 02/11/21 0615 02/12/21 5035 02/13/21 0524  NA  --    < > 135 136 136 138 140  K  --    < > 3.6 4.0 3.6 3.5 3.3*  CL  --    < > 103 102 101 103 103  CO2  --    < > 26 25 26 27 28   GLUCOSE  --    < > 120* 126* 117* 102* 102*  BUN  --    < > 22 37* 38* 26* 18  CREATININE  --    < > 0.86 1.95* 1.11* 0.73 0.62  CALCIUM  --    < > 8.8* 8.8* 8.6* 8.6* 8.5*  MG 2.1  --   --   --   --   --  2.0  PHOS  --   --   --   --   --   --  2.6   < > = values in this interval not displayed.   GFR Estimated Creatinine Clearance: 54.4 mL/min (by C-G formula based on SCr of 0.62 mg/dL). Liver Function Tests: Recent Labs  Lab 02/06/21 2016  AST 18  ALT 14  ALKPHOS 101  BILITOT 1.6*  PROT 7.5  ALBUMIN 4.2   Recent Labs  Lab 02/06/21 2016  LIPASE 27   No results for input(s): AMMONIA in the last 168 hours. Coagulation profile Recent  Labs  Lab 02/08/21 0026  INR 1.2     CBC: Recent Labs  Lab 02/09/21 0309 02/10/21 0503 02/11/21 0615 02/12/21 0638 02/13/21 0524  WBC 13.7* 15.4* 14.5* 12.9* 12.2*  NEUTROABS 10.6* 12.1* 11.0* 9.5* 8.9*  HGB 12.3 11.5* 11.2* 11.2* 11.3*  HCT 37.8 35.4* 34.2* 35.0* 34.8*  MCV 89.8 89.8 90.2 90.4 89.2  PLT 236 236 274 297 333   Cardiac Enzymes: No results for input(s): CKTOTAL, CKMB, CKMBINDEX, TROPONINI in the last 168 hours. BNP (last 3 results) No results for input(s): PROBNP in the last 8760 hours. CBG: No results for input(s): GLUCAP in the last 168 hours. D-Dimer: No results for input(s): DDIMER in the last 72 hours. Hgb A1c: No results for input(s): HGBA1C in the last 72 hours. Lipid Profile: No results for input(s): CHOL, HDL, LDLCALC, TRIG, CHOLHDL, LDLDIRECT in the last 72 hours. Thyroid function studies: No results for input(s): TSH, T4TOTAL, T3FREE, THYROIDAB in the last 72 hours.  Invalid input(s): FREET3 Anemia work up: No results for input(s): VITAMINB12, FOLATE, FERRITIN, TIBC, IRON, RETICCTPCT in the last 72 hours. Sepsis Labs: Recent Labs  Lab 02/10/21 0503 02/11/21 0615 02/12/21 0626 02/13/21 0524  PROCALCITON 0.57 0.36 0.28  --   WBC 15.4* 14.5* 12.9* 12.2*    Microbiology Recent Results (from the past 240 hour(s))  Resp Panel by RT-PCR (Flu A&B, Covid) Nasopharyngeal Swab     Status: None   Collection Time: 02/06/21  7:38 PM   Specimen: Nasopharyngeal Swab; Nasopharyngeal(NP) swabs in vial transport medium  Result Value Ref Range Status   SARS Coronavirus 2 by RT PCR NEGATIVE NEGATIVE Final    Comment: (NOTE) SARS-CoV-2 target nucleic acids are NOT DETECTED.  The SARS-CoV-2 RNA is generally detectable in upper respiratory specimens during the acute phase of infection. The lowest concentration of SARS-CoV-2 viral copies this assay can detect is 138 copies/mL. A negative result does not preclude SARS-Cov-2 infection and should not be used as the sole basis for treatment  or other patient management decisions. A negative result may occur with  improper specimen collection/handling, submission of specimen other than nasopharyngeal swab, presence of viral mutation(s) within the areas targeted by this assay, and inadequate number of viral copies(<138 copies/mL). A negative result must be combined with clinical observations, patient history, and epidemiological information. The expected result is Negative.  Fact Sheet for Patients:  EntrepreneurPulse.com.au  Fact Sheet for Healthcare Providers:  IncredibleEmployment.be  This test is no t yet approved or cleared by the [place] FDA and  has been authorized for detection and/or diagnosis of SARS-CoV-2 by FDA under an Emergency Use Authorization (EUA). This EUA will remain  in effect (meaning this test can be used) for the duration of the COVID-19 declaration under Section 564(b)(1) of the Act, 21 U.S.C.section 360bbb-3(b)(1), unless the authorization is terminated  or revoked sooner.       Influenza A by PCR NEGATIVE NEGATIVE Final   Influenza B by PCR NEGATIVE NEGATIVE Final    Comment: (NOTE) The Xpert Xpress SARS-CoV-2/FLU/RSV plus assay is intended as an aid in the diagnosis of influenza from Nasopharyngeal swab specimens and should not be used as a sole basis for treatment. Nasal washings and aspirates are unacceptable for Xpert Xpress SARS-CoV-2/FLU/RSV testing.  Fact Sheet for Patients: EntrepreneurPulse.com.au  Fact Sheet for Healthcare Providers: IncredibleEmployment.be  This test is not yet approved or cleared by the [place] FDA and has been authorized for detection and/or diagnosis of SARS-CoV-2 by FDA under an Emergency  Use Authorization (EUA). This EUA will remain in effect (meaning this test can be used) for the duration of the COVID-19 declaration under Section 564(b)(1) of the Act, 21 U.S.C. section  360bbb-3(b)(1), unless the authorization is terminated or revoked.  Performed at [facility], [address] [place], [place] [address]     Procedures and diagnostic studies:  No results found.             LOS: 6 days   Briselda Naval  [facility]   Pager on [email or web address]. If 7PM-7AM, please contact night-coverage at [email or web address]     02/13/2021, 12:34 PM

## 2021-02-14 DIAGNOSIS — Z7189 Other specified counseling: Secondary | ICD-10-CM | POA: Diagnosis not present

## 2021-02-14 DIAGNOSIS — Z515 Encounter for palliative care: Secondary | ICD-10-CM | POA: Diagnosis not present

## 2021-02-14 DIAGNOSIS — I5021 Acute systolic (congestive) heart failure: Secondary | ICD-10-CM | POA: Diagnosis not present

## 2021-02-14 DIAGNOSIS — Z8673 Personal history of transient ischemic attack (TIA), and cerebral infarction without residual deficits: Secondary | ICD-10-CM | POA: Diagnosis not present

## 2021-02-14 DIAGNOSIS — I5031 Acute diastolic (congestive) heart failure: Secondary | ICD-10-CM | POA: Diagnosis not present

## 2021-02-14 DIAGNOSIS — I4891 Unspecified atrial fibrillation: Secondary | ICD-10-CM | POA: Diagnosis not present

## 2021-02-14 LAB — POTASSIUM: Potassium: 4.2 mmol/L (ref 3.5–5.1)

## 2021-02-14 MED ORDER — AMOXICILLIN-POT CLAVULANATE 875-125 MG PO TABS: 1.0000 | ORAL_TABLET | Freq: Two times a day (BID) | ORAL | 0 refills | Status: AC

## 2021-02-14 MED ORDER — AMIODARONE HCL 200 MG PO TABS: 200.0000 mg | ORAL_TABLET | Freq: Two times a day (BID) | ORAL | 0 refills | Status: AC

## 2021-02-14 NOTE — Progress Notes (Signed)
Report given to Colletta Maryland, RN at [facility].

## 2021-02-14 NOTE — Progress Notes (Signed)
SLP Cancellation Note  Patient Details Name: Norma Hill MRN: [id number] DOB: 06/07/1945   Cancelled treatment:       Reason Eval/Treat Not Completed:  (chart reviewed). Per Palliative care note today, Family is requesting comfort and dignity at end-of-life, residential Hospice; noted current plan is to D/C to [facility] w/ Hospice services - not participating in therapy(per family request).  Recommend continue current diet at this time as discussed w/ Family(Sister in law) and MD over the weekend. See last tx note.  MD to reconsult ST services if any new needs arise while admitted.       Orinda Kenner, MS, CCC-SLP Speech Language Pathologist Rehab Services [phone] [facility] 02/14/2021, 1:15 PM

## 2021-02-14 NOTE — TOC Progression Note (Addendum)
Transition of Care [facility]) - Progression Note    Patient Details  Name: Norma Hill MRN: [id number] Date of Birth: 1945/08/08  Transition of Care [facility]) CM/SW [place], LCSW Phone Number: 02/14/2021, 11:58 AM  Clinical Narrative:   Per Palliative pt will dc to [facility] with hospice, therefore it would be private pay as pt would not be participating in therapy- per families request. CSW has contacted SNF to have their financial department speak with family to discuss payment as pt is ready to dc to facility.  Tammy at [facility] responded stating she will have business office to reach out to family as soon as she gets out of [facility].   Expected Discharge Plan:  (to be deteremined) Barriers to Discharge: Continued Medical Work up  Expected Discharge Plan and Services Expected Discharge Plan:  (to be deteremined)   Discharge Planning Services: CM Consult Post Acute Care Choice: [place] arrangements for the past 2 months: Single Family Home                 DME Arranged: N/A DME Agency: NA                   Social Determinants of Health (SDOH) Interventions    Readmission Risk Interventions No flowsheet data found.

## 2021-02-14 NOTE — Progress Notes (Addendum)
[facility]) Hospital Liaison Note   Patient chart and information reviewed by [facility] physician. Hospice Home eligibility confirmed.    Hospice Home is able to offer a bed today with request for transport at 6 pm. Transport set up with [facility] for 6 pm. Family agreeable to transfer today. TOC Manager aware.   RN please call report to [place] at [phone] prior to patient leaving the unit.  Please send signed and completed DNR with patient at discharge.   Please do not hesitate to call with any hospice related questions.    Thank you for the opportunity to participate in this patient's care.   Bobbie "Loren Racer, RN, BSN [facility] Liaison [phone]

## 2021-02-14 NOTE — Progress Notes (Signed)
[facility]) Hospital Liaison Note  Received request from Transitions of Care Manager Loletha Grayer, LCSW for family interest in [place]. Visited patient at bedside and spoke with husband Rebecah Dangerfield to confirm interest and explain services.  Approval for Hospice Home is determined by [facility] MD. Once [facility] MD has determined Hospice Home eligibility, [place] will update hospital staff and family.  Please do not hesitate to call with any hospice related questions.    Thank you for the opportunity to participate in this patient's care.   Bobbie "Loren Racer, RN, BSN [facility] Liaison [phone]

## 2021-02-14 NOTE — Progress Notes (Signed)
Physical Therapy Treatment Patient Details Name: Norma Hill MRN: [id number] DOB: [birth year] Today's Date: 02/14/2021   History of Present Illness pt is a 75 y.o. female with a past medical history of atrial fibrillation anticoagulated on Eliquis, heart failure with preserved ejection fraction, hypertension, history of CVA in 2020 who presented to the ED on 12/5 with chief complaint of progressively worsening shortness of breath and generalized weakness. Underwent cardioversion.    PT Comments    Patient much more alert this session, though with fatigue after activities resting with eyes closed at PT exit. The patient demonstrated some improvement towards goals this session. modA to transition to EOB and maxA to reposition in midline at EOB. She maintained sitting with CGA-supervision for at least 10 minutes to participate in medication administration from RN as well as MD assessment. Sit <> stand with RW and min-modA performed three times. Unable to maintain standing without at least min-modA due to posterior lean, but maintained standing 20-30seconds each bout. Returned to supine maxA, repositioned with all needs in reach. The patient would benefit from further skilled PT intervention to continue to progress towards goals. Recommendation remains appropriate.   Pt on 2L via [place], spO2 >90% and HR WFLs throughout session.    Recommendations for follow up therapy are one component of a multi-disciplinary discharge planning process, led by the attending physician.  Recommendations may be updated based on patient status, additional functional criteria and insurance authorization.  Follow Up Recommendations  Skilled nursing-short term rehab (<3 hours/day)     Assistance Recommended at Discharge Frequent or constant Supervision/Assistance  Equipment Recommendations  Rolling walker (2 wheels)    Recommendations for Other Services OT consult     Precautions / Restrictions  Precautions Precautions: Fall Restrictions Weight Bearing Restrictions: No     Mobility  Bed Mobility Overal bed mobility: Needs Assistance Bed Mobility: Supine to Sit;Sit to Supine     Supine to sit: Mod assist;HOB elevated Sit to supine: Max assist   General bed mobility comments: MAX A for trunk/LEs back to bed    Transfers Overall transfer level: Needs assistance Equipment used: Rolling walker (2 wheels) Transfers: Sit to/from Stand Sit to Stand: Min assist;Mod assist           General transfer comment: sit to stand performed three times, min-modA. posterior lean noted throughout standing, unable to advance LEs    Ambulation/Gait                   Stairs             Wheelchair Mobility    Modified Rankin (Stroke Patients Only)       Balance Overall balance assessment: Needs assistance Sitting-balance support: Feet supported Sitting balance-Leahy Scale: Fair Sitting balance - Comments: able to sit with supervision/CGA for several minutes (~1mins) for medication administration and MD assessment Postural control: Posterior lean Standing balance support: Bilateral upper extremity supported Standing balance-Leahy Scale: Poor                              Cognition Arousal/Alertness: Awake/alert Behavior During Therapy: Flat affect Overall Cognitive Status: Within Functional Limits for tasks assessed                                 General Comments: pt oriented to self, palce (hospital), disoriented x2, requires increased processing time, drowsy throughout,  requiring multiple modes of stimuli to attend ~10% of the time.        Exercises Other Exercises Other Exercises: heel slides x10, ankle pumps x10    General Comments        Pertinent Vitals/Pain Pain Assessment: No/denies pain    Home Living                          Prior Function            PT Goals (current goals can now be found in  the care plan section) Progress towards PT goals: Progressing toward goals    Frequency    Min 2X/week      PT Plan Current plan remains appropriate    Co-evaluation              AM-PAC PT "6 Clicks" Mobility   Outcome Measure  Help needed turning from your back to your side while in a flat bed without using bedrails?: A Lot Help needed moving from lying on your back to sitting on the side of a flat bed without using bedrails?: A Lot Help needed moving to and from a bed to a chair (including a wheelchair)?: A Lot Help needed standing up from a chair using your arms (e.g., wheelchair or bedside chair)?: A Lot Help needed to walk in hospital room?: Total Help needed climbing 3-5 steps with a railing? : Total 6 Click Score: 10    End of Session Equipment Utilized During Treatment: Gait belt Activity Tolerance: Patient tolerated treatment well Patient left: in bed;with call bell/phone within reach;with bed alarm set;with family/visitor present Nurse Communication: Mobility status PT Visit Diagnosis: Other abnormalities of gait and mobility (R26.89);Difficulty in walking, not elsewhere classified (R26.2);Muscle weakness (generalized) (M62.81)     Time: 5945-8592 PT Time Calculation (min) (ACUTE ONLY): 27 min  Charges:  $Therapeutic Exercise: 8-22 mins $Therapeutic Activity: 8-22 mins                     Lieutenant Diego PT, DPT 10:17 AM,02/14/21

## 2021-02-14 NOTE — Progress Notes (Signed)
Palliative: Mrs. Hase is lying quietly in bed.  She appears acutely/chronically ill and weak.  She is resting soundly, but wakes when I touch her arm.  She does not make eye contact.  Her husband is at bedside, but he is on the phone.  Chart review completed.  It seems that she has not had meaningful intake in several days.  Family is requesting residential hospice referral.  Conference with attending, bedside nursing staff, transition of care team and local [facility] hospice representative.  Plan: Requesting comfort and dignity at end-of-life, residential hospice with [place]. Prognosis: 2 weeks or less anticipated if family elects comfort and dignity, let nature take its course.  58 minutes Quinn Axe, NP Palliative medicine team Team phone [phone] Greater than 50% of this time was spent counseling and coordinating care related to the above assessment and plan.

## 2021-02-14 NOTE — Progress Notes (Signed)
PT Cancellation Note  Patient Details Name: Norma Hill MRN: [id number] DOB: Jan 27, 1946   Cancelled Treatment:    Reason Eval/Treat Not Completed: Other (comment). Per chart review and staff report,  family requesting comfort and dignity at end-of-life with plans for residential hospice per pallative note. PT to SIGN OFF at this time.   Lieutenant Diego PT, DPT 2:40 PM,02/14/21

## 2021-02-14 NOTE — Progress Notes (Signed)
OT Cancellation Note  Patient Details Name: Norma Hill MRN: [id number] DOB: Jul 20, 1945   Cancelled Treatment:    Reason Eval/Treat Not Completed: Other (comment) Per chart review and staff report,  family requesting comfort and dignity at end-of-life with plans for residential hospice per pallative note. OT to SIGN OFF at this time.   Darleen Crocker, MS, OTR/L , CBIS ascom [phone]  02/14/21, 1:29 PM

## 2021-02-14 NOTE — Progress Notes (Signed)
[facility]  SUBJECTIVE: Patient laying in bed, continues to experience cough and shortness of breath   Vitals:   02/13/21 1558 02/13/21 2100 02/14/21 0000 02/14/21 0400  BP: 139/63 (!) 146/69 (!) 158/71 (!) 165/82  Pulse: 71 84 78 77  Resp: 20 16 20 20   Temp: 98.3 F (36.8 C) 97.9 F (36.6 C) 98.4 F (36.9 C) 98.2 F (36.8 C)  TempSrc:  Oral Oral Oral  SpO2: 95% 94% 95% 94%  Weight:    72.1 kg  Height:         Intake/Output Summary (Last 24 hours) at 02/14/2021 1191 Last data filed at 02/14/2021 4782 Gross per 24 hour  Intake 75 ml  Output 725 ml  Net -650 ml      PHYSICAL EXAM  General: Well developed, well nourished, in no acute distress HEENT:  Normocephalic and atramatic Neck:  No JVD.  Lungs: Clear bilaterally to auscultation and percussion. Heart: HRRR . Normal S1 and S2 without gallops or murmurs.  Abdomen: Bowel sounds are positive, abdomen soft and non-tender  Msk:  Back normal, normal gait. Normal strength and tone for age. Extremities: No clubbing, cyanosis or edema.   Neuro: Alert and oriented X 3. Psych:  Good affect, responds appropriately   LABS: Basic Metabolic Panel: Recent Labs    02/12/21 0638 02/13/21 0524 02/14/21 0310  NA 138 140  --   K 3.5 3.3* 4.2  CL 103 103  --   CO2 27 28  --   GLUCOSE 102* 102*  --   BUN 26* 18  --   CREATININE 0.73 0.62  --   CALCIUM 8.6* 8.5*  --   MG  --  2.0  --   PHOS  --  2.6  --    Liver Function Tests: No results for input(s): AST, ALT, ALKPHOS, BILITOT, PROT, ALBUMIN in the last 72 hours. No results for input(s): LIPASE, AMYLASE in the last 72 hours. CBC: Recent Labs    02/12/21 0638 02/13/21 0524  WBC 12.9* 12.2*  NEUTROABS 9.5* 8.9*  HGB 11.2* 11.3*  HCT 35.0* 34.8*  MCV 90.4 89.2  PLT 297 333   Cardiac Enzymes: No results for input(s): CKTOTAL, CKMB, CKMBINDEX, TROPONINI in the last 72 hours. BNP: Invalid input(s): POCBNP D-Dimer: No results for input(s): DDIMER in the last  72 hours. Hemoglobin A1C: No results for input(s): HGBA1C in the last 72 hours. Fasting Lipid Panel: No results for input(s): CHOL, HDL, LDLCALC, TRIG, CHOLHDL, LDLDIRECT in the last 72 hours. Thyroid Function Tests: No results for input(s): TSH, T4TOTAL, T3FREE, THYROIDAB in the last 72 hours.  Invalid input(s): FREET3 Anemia Panel: No results for input(s): VITAMINB12, FOLATE, FERRITIN, TIBC, IRON, RETICCTPCT in the last 72 hours.  No results found.   Echo LVEF 60 to 65% 02/07/2021  TELEMETRY: Sinus rhythm 67 bpm:  ASSESSMENT AND PLAN:  Principal Problem:   Acute CHF (congestive heart failure) (HCC) Active Problems:   Atrial fibrillation with RVR (HCC)   History of stroke    1.  Paroxysmal atrial fibrillation, status post successful electrical cardioversion 02/10/2021, on Eliquis for stroke prevention, and amiodarone for rate and rhythm control 2.  Acute diastolic congestive heart failure, LVEF 60 to 65%, appears euvolemic, not requiring diuretics 3.  Aspiration pneumonia, on broad-spectrum antibiotics 4.  Essential hypertension, systolic blood pressure mildly elevated today 5.  Acute kidney injury, normalized, BUN and creatinine 18 and 0.62, respectively   Recommendations   1.  Agree with current therapy 2.  Continue Eliquis for stroke prevention 3.  Continue amiodarone 200 mg twice daily for rate and rhythm control 4.  Hold furosemide for now 5.  No further cardiac diagnostics at this time 6.  Add low-dose Cardizem CD 120-180 mg daily if BP elevation persists 7.  Follow-up Dr. Nehemiah Massed 1 to 2 weeks after discharge     Isaias Cowman, MD, PhD, [facility] 02/14/2021 8:03 AM

## 2021-02-14 NOTE — Discharge Summary (Addendum)
Physician Discharge Summary  Norma Hill KZS:[id number] DOB: 1945-07-07 DOA: 02/07/2021  PCP: Guadalupe Maple, MD  Admit date: 02/07/2021 Discharge date: 02/14/2021  Discharge disposition: Hospice Home   Recommendations for Outpatient Follow-Up:   Follow-up with hospice team at home in 1 to 2 days   Discharge Diagnosis:   Principal Problem:   Acute CHF (congestive heart failure) ([place]) Active Problems:   Atrial fibrillation with RVR ([place])   History of stroke    Discharge Condition: Stable.  Diet recommendation:  Diet Order             Diet general           DIET DYS 3 Room service appropriate? Yes with Assist; Fluid consistency: Nectar Thick  Diet effective now                     Code Status: DNR     Hospital Course:   [name] is a 75 y.o. female with medical history significant for hypertension, atrial fibrillation, breast cancer, chronic systolic CHF, history of stroke, dementia.  She presented to the hospital with progressively worsening shortness of breath, dry cough, wheezing, PND, generalized weakness and bilateral lower extremity edema.   She was found to have acute diastolic CHF, atrial fibrillation with RVR, suspected aspiration pneumonia, acute hypoxemic respiratory failure and acute toxic metabolic encephalopathy.  Of note, patient was initially bradycardic when she presented to the emergency room.  She was on metoprolol and Cardizem prior to admission which were discontinued.  She was treated with IV Lasix, empiric IV antibiotics and oxygen via nasal cannula (she was weaned down from 5 L/min to 2 L/min oxygen via nasal cannula).  IV Unasyn was switched to Augmentin.  She underwent electrical cardioversion on 02/09/2021 for atrial fibrillation and she converted to normal sinus rhythm.  She developed AKI with IV Lasix and this was exacerbated by poor oral intake.  Lasix was discontinued.  Unfortunately, patient has progressive  dementia which has affected her ability to walk and to eat.  She was evaluated by palliative care and hospice team, and patient and her husband opted for hospice care.  She was deemed eligible for hospice care at the hospice home.  She will discharge to the hospice home today.  Discharge plan discussed with the patient and her husband at the bedside.     Medical Consultants:   Cardiologist Palliative care and hospice teams   Discharge Exam:    Vitals:   02/13/21 2100 02/14/21 0000 02/14/21 0400 02/14/21 1106  BP: (!) 146/69 (!) 158/71 (!) 165/82 (!) 146/77  Pulse: 84 78 77 82  Resp: 16 20 20 18   Temp: 97.9 F (36.6 C) 98.4 F (36.9 C) 98.2 F (36.8 C) 98.5 F (36.9 C)  TempSrc: Oral Oral Oral Oral  SpO2: 94% 95% 94% 93%  Weight:   72.1 kg   Height:         GEN: NAD SKIN: Warm and dry EYES: No pallor or icterus ENT: MMM CV: RRR PULM: CTA B ABD: soft, ND, NT, +BS CNS: AAO x 3, non focal EXT: No edema or tenderness   The results of significant diagnostics from this hospitalization (including imaging, microbiology, ancillary and laboratory) are listed below for reference.     Procedures and Diagnostic Studies:   ECHOCARDIOGRAM COMPLETE  Result Date: 02/07/2021    ECHOCARDIOGRAM REPORT   Patient Name:   Norma Hill Date of Exam: 02/07/2021 Medical Rec #:  [id number]          Height:       62.0 in Accession #:    [id number]         Weight:       145.0 lb Date of Birth:  1945/03/17          BSA:          1.667 m Patient Age:    75 years           BP:           123/84 mmHg Patient Gender: F                  HR:           75 bpm. Exam Location:  [facility] Procedure: 2D Echo, Color Doppler and Cardiac Doppler Indications:     I50.21 congestive heart failure-Acute Systolic  History:         Patient has prior history of Echocardiogram examinations.                  Arrythmias:Atrial Fibrillation, Signs/Symptoms:Shortness of                  Breath and Edema; Risk  Factors:Hypertension.  Sonographer:     Charmayne Sheer Referring Phys:  [id number] [place] Diagnosing Phys: Serafina Royals MD  Sonographer Comments: Suboptimal apical window and no subcostal window. IMPRESSIONS  1. Left ventricular ejection fraction, by estimation, is 60 to 65%. The left ventricle has normal function. The left ventricle has no regional wall motion abnormalities. Left ventricular diastolic parameters were normal.  2. Right ventricular systolic function is normal. The right ventricular size is mildly enlarged.  3. Left atrial size was mildly dilated.  4. Right atrial size was mildly dilated.  5. The mitral valve is normal in structure. Trivial mitral valve regurgitation.  6. The aortic valve is normal in structure. Aortic valve regurgitation is not visualized. FINDINGS  Left Ventricle: Left ventricular ejection fraction, by estimation, is 60 to 65%. The left ventricle has normal function. The left ventricle has no regional wall motion abnormalities. The left ventricular internal cavity size was normal in size. There is  no left ventricular hypertrophy. Left ventricular diastolic parameters were normal. Right Ventricle: The right ventricular size is mildly enlarged. No increase in right ventricular wall thickness. Right ventricular systolic function is normal. Left Atrium: Left atrial size was mildly dilated. Right Atrium: Right atrial size was mildly dilated. Pericardium: There is no evidence of pericardial effusion. Mitral Valve: The mitral valve is normal in structure. Trivial mitral valve regurgitation. Tricuspid Valve: The tricuspid valve is normal in structure. Tricuspid valve regurgitation is mild. Aortic Valve: The aortic valve is normal in structure. Aortic valve regurgitation is not visualized. Aortic valve mean gradient measures 3.0 mmHg. Aortic valve peak gradient measures 5.7 mmHg. Aortic valve area, by VTI measures 2.53 cm. Pulmonic Valve: The pulmonic valve was normal in structure.  Pulmonic valve regurgitation is trivial. Aorta: The aortic root and ascending aorta are structurally normal, with no evidence of dilitation. IAS/Shunts: No atrial level shunt detected by color flow Doppler.  LEFT VENTRICLE PLAX 2D LVIDd:         4.73 cm   Diastology LVIDs:         3.83 cm   LV e' medial:    12.30 cm/s LV PW:         0.94 cm   LV E/e' medial:  6.1 LV IVS:  0.81 cm   LV e' lateral:   13.30 cm/s LVOT diam:     2.10 cm   LV E/e' lateral: 5.7 LV SV:         54 LV SV Index:   33 LVOT Area:     3.46 cm  LEFT ATRIUM             Index LA diam:        4.70 cm 2.82 cm/m LA Vol (A2C):   52.0 ml 31.18 ml/m LA Vol (A4C):   95.2 ml 57.09 ml/m LA Biplane Vol: 75.7 ml 45.40 ml/m  AORTIC VALVE                    PULMONIC VALVE AV Area (Vmax):    2.20 cm     PV Vmax:       0.76 m/s AV Area (Vmean):   2.35 cm     PV Vmean:      48.200 cm/s AV Area (VTI):     2.53 cm     PV VTI:        0.107 m AV Vmax:           119.00 cm/s  PV Peak grad:  2.3 mmHg AV Vmean:          79.600 cm/s  PV Mean grad:  1.0 mmHg AV VTI:            0.215 m AV Peak Grad:      5.7 mmHg AV Mean Grad:      3.0 mmHg LVOT Vmax:         75.70 cm/s LVOT Vmean:        53.900 cm/s LVOT VTI:          0.157 m LVOT/AV VTI ratio: 0.73  AORTA Ao Root diam: 3.10 cm MITRAL VALVE               TRICUSPID VALVE MV Area (PHT): 5.57 cm    TR Peak grad:   26.0 mmHg MV Decel Time: 136 msec    TR Vmax:        255.00 cm/s MV E velocity: 75.42 cm/s MV A velocity: 39.40 cm/s  SHUNTS MV E/A ratio:  1.91        Systemic VTI:  0.16 m                            Systemic Diam: 2.10 cm Serafina Royals MD Electronically signed by Serafina Royals MD Signature Date/Time: 02/07/2021/1:30:21 PM    Final      Labs:   Basic Metabolic Panel: Recent Labs  Lab 02/09/21 0309 02/10/21 0503 02/11/21 0615 02/12/21 7035 02/13/21 0524 02/14/21 0310  NA 135 136 136 138 140  --   K 3.6 4.0 3.6 3.5 3.3* 4.2  CL 103 102 101 103 103  --   CO2 26 25 26 27 28   --    GLUCOSE 120* 126* 117* 102* 102*  --   BUN 22 37* 38* 26* 18  --   CREATININE 0.86 1.95* 1.11* 0.73 0.62  --   CALCIUM 8.8* 8.8* 8.6* 8.6* 8.5*  --   MG  --   --   --   --  2.0  --   PHOS  --   --   --   --  2.6  --    GFR Estimated Creatinine Clearance: 54.8 mL/min (by C-G formula based  on SCr of 0.62 mg/dL). Liver Function Tests: No results for input(s): AST, ALT, ALKPHOS, BILITOT, PROT, ALBUMIN in the last 168 hours. No results for input(s): LIPASE, AMYLASE in the last 168 hours. No results for input(s): AMMONIA in the last 168 hours. Coagulation profile Recent Labs  Lab 02/08/21 0026  INR 1.2    CBC: Recent Labs  Lab 02/09/21 0309 02/10/21 0503 02/11/21 0615 02/12/21 0638 02/13/21 0524  WBC 13.7* 15.4* 14.5* 12.9* 12.2*  NEUTROABS 10.6* 12.1* 11.0* 9.5* 8.9*  HGB 12.3 11.5* 11.2* 11.2* 11.3*  HCT 37.8 35.4* 34.2* 35.0* 34.8*  MCV 89.8 89.8 90.2 90.4 89.2  PLT 236 236 274 297 333   Cardiac Enzymes: No results for input(s): CKTOTAL, CKMB, CKMBINDEX, TROPONINI in the last 168 hours. BNP: Invalid input(s): POCBNP CBG: No results for input(s): GLUCAP in the last 168 hours. D-Dimer No results for input(s): DDIMER in the last 72 hours. Hgb A1c No results for input(s): HGBA1C in the last 72 hours. Lipid Profile No results for input(s): CHOL, HDL, LDLCALC, TRIG, CHOLHDL, LDLDIRECT in the last 72 hours. Thyroid function studies No results for input(s): TSH, T4TOTAL, T3FREE, THYROIDAB in the last 72 hours.  Invalid input(s): FREET3 Anemia work up No results for input(s): VITAMINB12, FOLATE, FERRITIN, TIBC, IRON, RETICCTPCT in the last 72 hours. Microbiology Recent Results (from the past 240 hour(s))  Resp Panel by RT-PCR (Flu A&B, Covid) Nasopharyngeal Swab     Status: None   Collection Time: 02/06/21  7:38 PM   Specimen: Nasopharyngeal Swab; Nasopharyngeal(NP) swabs in vial transport medium  Result Value Ref Range Status   SARS Coronavirus 2 by RT PCR NEGATIVE  NEGATIVE Final    Comment: (NOTE) SARS-CoV-2 target nucleic acids are NOT DETECTED.  The SARS-CoV-2 RNA is generally detectable in upper respiratory specimens during the acute phase of infection. The lowest concentration of SARS-CoV-2 viral copies this assay can detect is 138 copies/mL. A negative result does not preclude SARS-Cov-2 infection and should not be used as the sole basis for treatment or other patient management decisions. A negative result may occur with  improper specimen collection/handling, submission of specimen other than nasopharyngeal swab, presence of viral mutation(s) within the areas targeted by this assay, and inadequate number of viral copies(<138 copies/mL). A negative result must be combined with clinical observations, patient history, and epidemiological information. The expected result is Negative.  Fact Sheet for Patients:  EntrepreneurPulse.com.au  Fact Sheet for Healthcare Providers:  IncredibleEmployment.be  This test is no t yet approved or cleared by the [place] FDA and  has been authorized for detection and/or diagnosis of SARS-CoV-2 by FDA under an Emergency Use Authorization (EUA). This EUA will remain  in effect (meaning this test can be used) for the duration of the COVID-19 declaration under Section 564(b)(1) of the Act, 21 U.S.C.section 360bbb-3(b)(1), unless the authorization is terminated  or revoked sooner.       Influenza A by PCR NEGATIVE NEGATIVE Final   Influenza B by PCR NEGATIVE NEGATIVE Final    Comment: (NOTE) The Xpert Xpress SARS-CoV-2/FLU/RSV plus assay is intended as an aid in the diagnosis of influenza from Nasopharyngeal swab specimens and should not be used as a sole basis for treatment. Nasal washings and aspirates are unacceptable for Xpert Xpress SARS-CoV-2/FLU/RSV testing.  Fact Sheet for Patients: EntrepreneurPulse.com.au  Fact Sheet for Healthcare  Providers: IncredibleEmployment.be  This test is not yet approved or cleared by the [place] FDA and has been authorized for detection and/or diagnosis of  SARS-CoV-2 by FDA under an Emergency Use Authorization (EUA). This EUA will remain in effect (meaning this test can be used) for the duration of the COVID-19 declaration under Section 564(b)(1) of the Act, 21 U.S.C. section 360bbb-3(b)(1), unless the authorization is terminated or revoked.  Performed at [facility], [address] [place], [place] [address]      Discharge Instructions:   Discharge Instructions     Diet general   Complete by: As directed    Discharge instructions   Complete by: As directed    Follow up with hospice team at home in 1 to 2 days      Allergies as of 02/14/2021   No Known Allergies      Medication List     STOP taking these medications    aspirin 81 MG EC tablet   polyethylene glycol 17 g packet Commonly known as: MIRALAX / GLYCOLAX   tamsulosin 0.4 MG Caps capsule Commonly known as: FLOMAX       TAKE these medications    acetaminophen 325 MG tablet Commonly known as: TYLENOL Take 2 tablets (650 mg total) by mouth every 6 (six) hours as needed for mild pain (or Fever >/= 101).   amiodarone 200 MG tablet Commonly known as: PACERONE Take 1 tablet (200 mg total) by mouth 2 (two) times daily.   amoxicillin-clavulanate 875-125 MG tablet Commonly known as: AUGMENTIN Take 1 tablet by mouth every 12 (twelve) hours for 3 days.   apixaban 5 MG Tabs tablet Commonly known as: Eliquis Take 1 tablet (5 mg total) by mouth 2 (two) times daily.   diltiazem 180 MG 24 hr capsule Commonly known as: CARDIZEM CD Take 1 capsule (180 mg total) by mouth 2 (two) times daily.   lisinopril 5 MG tablet Commonly known as: ZESTRIL Take 1 tablet by mouth daily.   metoprolol succinate 25 MG 24 hr tablet Commonly known as: TOPROL-XL Take 1 tablet (25 mg  total) by mouth daily.   rosuvastatin 10 MG tablet Commonly known as: CRESTOR Take 1 tablet (10 mg total) by mouth daily.   Tylenol PM Extra Strength 25-500 MG Tabs tablet Generic drug: diphenhydramine-acetaminophen Take 1 tablet by mouth at bedtime as needed.        Contact information for after-discharge care     Destination     [facility] Preferred [facility]F .   Service: Skilled Nursing Contact information: [address] [place] [phone]                       If you experience worsening of your admission symptoms, develop shortness of breath, life threatening emergency, suicidal or homicidal thoughts you must seek medical attention immediately by calling 911 or calling your MD immediately  if symptoms less severe.   You must read complete instructions/literature along with all the possible adverse reactions/side effects for all the medicines you take and that have been prescribed to you. Take any new medicines after you have completely understood and accept all the possible adverse reactions/side effects.    Please note   You were cared for by a hospitalist during your hospital stay. If you have any questions about your discharge medications or the care you received while you were in the hospital after you are discharged, you can call the unit and asked to speak with the hospitalist on call if the hospitalist that took care of you is not available. Once you are discharged,  your primary care physician will handle any further medical issues. Please note that NO REFILLS for any discharge medications will be authorized once you are discharged, as it is imperative that you return to your primary care physician (or establish a relationship with a primary care physician if you do not have one) for your aftercare needs so that they can reassess your need for medications and monitor your lab values.       Time coordinating  discharge: 36 minutes  Signed:  Starasia Sinko  [facility] 02/14/2021, 3:15 PM   Pager on [email or web address]. If 7PM-7AM, please contact night-coverage at [email or web address]

## 2021-02-17 ENCOUNTER — Ambulatory Visit: Payer: Medicare PPO | Admitting: Family

## 2021-03-06 DEATH — deceased

## 2021-06-10 ENCOUNTER — Ambulatory Visit: Payer: Medicare PPO
